# Patient Record
Sex: Male | Born: 1950
Health system: Southern US, Community
[De-identification: ages and names within clinical notes are randomized; demographics above are authoritative.]

## PROBLEM LIST (undated history)

## (undated) DIAGNOSIS — K219 Gastro-esophageal reflux disease without esophagitis: Secondary | ICD-10-CM

## (undated) DIAGNOSIS — E782 Mixed hyperlipidemia: Secondary | ICD-10-CM

## (undated) DIAGNOSIS — M1990 Unspecified osteoarthritis, unspecified site: Secondary | ICD-10-CM

## (undated) DIAGNOSIS — M179 Osteoarthritis of knee, unspecified: Secondary | ICD-10-CM

## (undated) DIAGNOSIS — G20A1 Parkinson's disease without dyskinesia, without mention of fluctuations: Secondary | ICD-10-CM

## (undated) DIAGNOSIS — M1710 Unilateral primary osteoarthritis, unspecified knee: Secondary | ICD-10-CM

## (undated) DIAGNOSIS — G709 Myoneural disorder, unspecified: Secondary | ICD-10-CM

## (undated) DIAGNOSIS — T7840XA Allergy, unspecified, initial encounter: Secondary | ICD-10-CM

## (undated) DIAGNOSIS — Z86010 Personal history of colonic polyps: Secondary | ICD-10-CM

## (undated) DIAGNOSIS — E669 Obesity, unspecified: Secondary | ICD-10-CM

## (undated) DIAGNOSIS — G20 Parkinson's disease: Secondary | ICD-10-CM

## (undated) DIAGNOSIS — I10 Essential (primary) hypertension: Secondary | ICD-10-CM

## (undated) DIAGNOSIS — H269 Unspecified cataract: Secondary | ICD-10-CM

## (undated) DIAGNOSIS — E119 Type 2 diabetes mellitus without complications: Secondary | ICD-10-CM

## (undated) HISTORY — DX: Unspecified osteoarthritis, unspecified site: M19.90

## (undated) HISTORY — DX: Unspecified cataract: H26.9

## (undated) HISTORY — PX: GANGLION CYST EXCISION: SHX1691

## (undated) HISTORY — DX: Parkinson's disease: G20

## (undated) HISTORY — DX: Essential (primary) hypertension: I10

## (undated) HISTORY — DX: Type 2 diabetes mellitus without complications: E11.9

## (undated) HISTORY — DX: Allergy, unspecified, initial encounter: T78.40XA

## (undated) HISTORY — DX: Osteoarthritis of knee, unspecified: M17.9

## (undated) HISTORY — DX: Mixed hyperlipidemia: E78.2

## (undated) HISTORY — DX: Gastro-esophageal reflux disease without esophagitis: K21.9

## (undated) HISTORY — DX: Myoneural disorder, unspecified: G70.9

## (undated) HISTORY — DX: Obesity, unspecified: E66.9

## (undated) HISTORY — PX: CATARACT EXTRACTION, BILATERAL: SHX1313

## (undated) HISTORY — PX: KNEE ARTHROSCOPY: SHX127

## (undated) HISTORY — DX: Parkinson's disease without dyskinesia, without mention of fluctuations: G20.A1

## (undated) HISTORY — PX: WRIST FRACTURE SURGERY: SHX121

## (undated) HISTORY — DX: Unilateral primary osteoarthritis, unspecified knee: M17.10

## (undated) HISTORY — PX: KNEE ARTHROSCOPY: SUR90

---
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
## 1998-07-11 ENCOUNTER — Other Ambulatory Visit: Admission: RE | Admit: 1998-07-11 | Discharge: 1998-07-11 | Payer: Self-pay | Admitting: Dermatology

## 2000-05-09 ENCOUNTER — Emergency Department (HOSPITAL_COMMUNITY): Admission: EM | Admit: 2000-05-09 | Discharge: 2000-05-09 | Payer: Self-pay

## 2009-03-30 ENCOUNTER — Encounter (INDEPENDENT_AMBULATORY_CARE_PROVIDER_SITE_OTHER): Payer: Self-pay | Admitting: *Deleted

## 2009-06-21 ENCOUNTER — Encounter: Payer: Self-pay | Admitting: Internal Medicine

## 2009-08-16 ENCOUNTER — Ambulatory Visit: Payer: Self-pay | Admitting: Internal Medicine

## 2009-08-29 ENCOUNTER — Ambulatory Visit: Payer: Self-pay | Admitting: Internal Medicine

## 2010-11-23 ENCOUNTER — Emergency Department (HOSPITAL_COMMUNITY): Admission: EM | Admit: 2010-11-23 | Discharge: 2010-08-14 | Payer: Self-pay | Admitting: Emergency Medicine

## 2011-03-02 LAB — URINALYSIS, ROUTINE W REFLEX MICROSCOPIC
Bilirubin Urine: NEGATIVE
Glucose, UA: NEGATIVE mg/dL
Ketones, ur: NEGATIVE mg/dL
Nitrite: NEGATIVE
Protein, ur: 30 mg/dL — AB
Specific Gravity, Urine: 1.022 (ref 1.005–1.030)
Urobilinogen, UA: 1 mg/dL (ref 0.0–1.0)
pH: 5 (ref 5.0–8.0)

## 2011-03-02 LAB — URINE MICROSCOPIC-ADD ON

## 2011-03-02 LAB — CULTURE, BLOOD (ROUTINE X 2)

## 2011-03-02 LAB — URINE CULTURE
Colony Count: >=100000
Culture  Setup Time: 201108290931

## 2011-03-02 LAB — DIFFERENTIAL
Basophils Relative: 0 % (ref 0–1)
Eosinophils Absolute: 0.1 10*3/uL (ref 0.0–0.7)
Eosinophils Relative: 0 % (ref 0–5)
Lymphs Abs: 1 10*3/uL (ref 0.7–4.0)

## 2011-03-02 LAB — POCT I-STAT, CHEM 8
Creatinine, Ser: 1.3 mg/dL (ref 0.4–1.5)
Glucose, Bld: 136 mg/dL — ABNORMAL HIGH (ref 70–99)
HCT: 47 % (ref 39.0–52.0)
Hemoglobin: 16 g/dL (ref 13.0–17.0)
Sodium: 138 mEq/L (ref 135–145)
TCO2: 23 mmol/L (ref 0–100)

## 2011-03-02 LAB — GLUCOSE, CAPILLARY
Glucose-Capillary: 129 mg/dL — ABNORMAL HIGH (ref 70–99)
Glucose-Capillary: 176 mg/dL — ABNORMAL HIGH (ref 70–99)

## 2011-03-02 LAB — CBC
MCH: 32.2 pg (ref 26.0–34.0)
MCHC: 34.8 g/dL (ref 30.0–36.0)
MCV: 92.5 fL (ref 78.0–100.0)
Platelets: 168 10*3/uL (ref 150–400)
RBC: 4.78 MIL/uL (ref 4.22–5.81)

## 2011-10-09 ENCOUNTER — Encounter: Payer: Self-pay | Admitting: Physician Assistant

## 2011-10-09 DIAGNOSIS — M1710 Unilateral primary osteoarthritis, unspecified knee: Secondary | ICD-10-CM | POA: Insufficient documentation

## 2011-10-09 DIAGNOSIS — E782 Mixed hyperlipidemia: Secondary | ICD-10-CM

## 2011-10-09 DIAGNOSIS — E119 Type 2 diabetes mellitus without complications: Secondary | ICD-10-CM | POA: Insufficient documentation

## 2011-10-09 DIAGNOSIS — I10 Essential (primary) hypertension: Secondary | ICD-10-CM | POA: Insufficient documentation

## 2011-12-27 ENCOUNTER — Ambulatory Visit (INDEPENDENT_AMBULATORY_CARE_PROVIDER_SITE_OTHER): Payer: 59

## 2011-12-27 DIAGNOSIS — H9319 Tinnitus, unspecified ear: Secondary | ICD-10-CM

## 2011-12-27 DIAGNOSIS — R51 Headache: Secondary | ICD-10-CM

## 2012-01-29 ENCOUNTER — Ambulatory Visit (INDEPENDENT_AMBULATORY_CARE_PROVIDER_SITE_OTHER): Payer: 59 | Admitting: Physician Assistant

## 2012-01-29 ENCOUNTER — Encounter: Payer: Self-pay | Admitting: Physician Assistant

## 2012-01-29 VITALS — BP 136/91 | HR 82 | Temp 98.5°F | Resp 16 | Ht 70.0 in | Wt 225.2 lb

## 2012-01-29 DIAGNOSIS — Z0000 Encounter for general adult medical examination without abnormal findings: Secondary | ICD-10-CM

## 2012-01-29 DIAGNOSIS — E119 Type 2 diabetes mellitus without complications: Secondary | ICD-10-CM

## 2012-01-29 DIAGNOSIS — E782 Mixed hyperlipidemia: Secondary | ICD-10-CM

## 2012-01-29 DIAGNOSIS — I10 Essential (primary) hypertension: Secondary | ICD-10-CM

## 2012-01-29 DIAGNOSIS — M24231 Disorder of ligament, right wrist: Secondary | ICD-10-CM

## 2012-01-29 DIAGNOSIS — Z79899 Other long term (current) drug therapy: Secondary | ICD-10-CM

## 2012-01-29 LAB — COMPREHENSIVE METABOLIC PANEL
Albumin: 4.4 g/dL (ref 3.5–5.2)
Alkaline Phosphatase: 49 U/L (ref 39–117)
BUN: 18 mg/dL (ref 6–23)
Calcium: 9.5 mg/dL (ref 8.4–10.5)
Glucose, Bld: 108 mg/dL — ABNORMAL HIGH (ref 70–99)
Potassium: 4.6 mEq/L (ref 3.5–5.3)

## 2012-01-29 LAB — LIPID PANEL
Cholesterol: 126 mg/dL (ref 0–200)
HDL: 41 mg/dL (ref >39)
LDL Cholesterol: 66 mg/dL (ref 0–99)
Triglycerides: 94 mg/dL (ref <150)

## 2012-01-29 LAB — POCT UA - MICROSCOPIC ONLY: Bacteria, U Microscopic: NEGATIVE

## 2012-01-29 LAB — TSH: TSH: 3.372 u[IU]/mL (ref 0.350–4.500)

## 2012-01-29 LAB — CBC WITH DIFFERENTIAL/PLATELET
Basophils Relative: 0 % (ref 0–1)
HCT: 44.3 % (ref 39.0–52.0)
Hemoglobin: 15.1 g/dL (ref 13.0–17.0)
MCH: 31.1 pg (ref 26.0–34.0)
MCHC: 34.1 g/dL (ref 30.0–36.0)
Monocytes Absolute: 0.6 10*3/uL (ref 0.1–1.0)
Monocytes Relative: 8 % (ref 3–12)
Neutro Abs: 4.8 10*3/uL (ref 1.7–7.7)

## 2012-01-29 LAB — POCT URINALYSIS DIPSTICK
Blood, UA: NEGATIVE
Leukocytes, UA: NEGATIVE
Spec Grav, UA: >=1.03
pH, UA: 5.5

## 2012-01-29 LAB — PSA: PSA: 0.67 ng/mL (ref <=4.00)

## 2012-01-29 LAB — IFOBT (OCCULT BLOOD): IFOBT: NEGATIVE

## 2012-01-29 NOTE — Assessment & Plan Note (Signed)
A1C today is 6.2%.  Continue current regimen and continue efforts for healthy eating and weight loss.

## 2012-01-29 NOTE — Assessment & Plan Note (Signed)
Above goal today, but typically well controlled, so no changes made today.  Again, healthy eating and weight loss are needed.

## 2012-01-29 NOTE — Assessment & Plan Note (Signed)
LDL goal <70. Await today's lab results.  Continue efforts for healthy eating.

## 2012-01-29 NOTE — Patient Instructions (Signed)

## 2012-01-29 NOTE — Progress Notes (Signed)
Subjective:    Patient ID: Danny [name], male    DOB: 1951-06-23, 61 y.o.   MRN: [id number]  HPI This patient presents for a Wellness exam.  Also, he needs routine follow-up for his diabetes, HTN and hyperlipidemia.  He feels well.  Continues square dancing and motorcycle riding.  Has grown a goatee. Blood sugars have run a little high on occasion, but only three above 200 in the past three months.   Review of Systems  Constitutional: Negative.   HENT: Negative.   Eyes: Negative.   Respiratory: Negative.   Cardiovascular: Negative.   Gastrointestinal: Negative.   Genitourinary: Negative.   Musculoskeletal: Positive for joint swelling and arthralgias.       Right wrist ligamentous derangement.  To have wrist fusion in the next 1-2 months.  Skin: Negative.   Neurological: Negative.   Hematological: Negative.   Psychiatric/Behavioral: Negative.        Objective:   Physical Exam  Vitals reviewed. Constitutional: He is oriented to person, place, and time. He appears well-developed and well-nourished.  Non-toxic appearance. He does not have a sickly appearance. He does not appear ill. No distress.  HENT:  Head: Normocephalic and atraumatic. No trismus in the jaw.  Right Ear: Hearing, tympanic membrane, external ear and ear canal normal.  Left Ear: Hearing, tympanic membrane, external ear and ear canal normal.  Nose: Nose normal.  Mouth/Throat: Uvula is midline, oropharynx is clear and moist and mucous membranes are normal. He does not have dentures. No oral lesions. Normal dentition. No dental abscesses, uvula swelling, lacerations or dental caries.  Eyes: Conjunctivae and EOM are normal. Pupils are equal, round, and reactive to light. Right eye exhibits no discharge. Left eye exhibits no discharge. No scleral icterus.  Fundoscopic exam:      The right eye shows no arteriolar narrowing, no AV nicking, no exudate, no hemorrhage and no papilledema. The right eye shows red reflex.The  right eye shows no venous pulsations.      The left eye shows no arteriolar narrowing, no AV nicking, no exudate, no hemorrhage and no papilledema. The left eye shows red reflex.The left eye shows no venous pulsations. Neck: Normal range of motion, full passive range of motion without pain and phonation normal. Neck supple. No spinous process tenderness and no muscular tenderness present. No rigidity. No tracheal deviation, no edema, no erythema and normal range of motion present. No thyromegaly present.  Cardiovascular: Normal rate, regular rhythm, S1 normal, S2 normal, normal heart sounds, intact distal pulses and normal pulses.  Exam reveals no gallop and no friction rub.   No murmur heard. Pulmonary/Chest: Effort normal and breath sounds normal. No respiratory distress. He has no wheezes. He has no rales.  Abdominal: Soft. Normal appearance and bowel sounds are normal. He exhibits no distension and no mass. There is no hepatosplenomegaly. There is no tenderness. There is no rebound and no guarding. No hernia. Hernia confirmed negative in the right inguinal area and confirmed negative in the left inguinal area.  Genitourinary: Rectum normal, prostate normal, testes normal and penis normal. Rectal exam shows no external hemorrhoid, no internal hemorrhoid, no fissure, no mass, no tenderness and anal tone normal. Guaiac negative stool. No phimosis, paraphimosis, hypospadias, penile erythema or penile tenderness. No discharge found.  Musculoskeletal: He exhibits no edema and no tenderness.       Right shoulder: Normal.       Left shoulder: Normal.       Right elbow: Normal.  Left elbow: Normal.       Right wrist: He exhibits decreased range of motion, tenderness and swelling.       Left wrist: Normal.       Right hip: Normal.       Left hip: Normal.       Right knee: Normal.       Left knee: Normal.       Right ankle: Normal. Achilles tendon normal.       Left ankle: Normal. Achilles tendon  normal.       Cervical back: Normal. He exhibits normal range of motion, no tenderness, no bony tenderness, no swelling, no edema, no deformity, no laceration, no pain, no spasm and normal pulse.       Thoracic back: Normal.       Lumbar back: Normal.       Right upper arm: Normal.       Left upper arm: Normal.       Right forearm: Normal.       Left forearm: Normal.       Arms:      Right hand: Normal.       Left hand: Normal.       Right upper leg: Normal.       Left upper leg: Normal.       Right lower leg: Normal.       Left lower leg: Normal.       Right foot: Normal.       Left foot: Normal.  Lymphadenopathy:       Head (right side): No submental, no submandibular, no tonsillar, no preauricular, no posterior auricular and no occipital adenopathy present.       Head (left side): No submental, no submandibular, no tonsillar, no preauricular, no posterior auricular and no occipital adenopathy present.    He has no cervical adenopathy.       Right: No inguinal and no supraclavicular adenopathy present.       Left: No inguinal and no supraclavicular adenopathy present.  Neurological: He is alert and oriented to person, place, and time. He has normal strength and normal reflexes. He displays no tremor. No cranial nerve deficit or sensory deficit. He exhibits normal muscle tone. Coordination and gait normal.       Sensation intact to monofilament testing bilaterally on diabetic foot exam.  Skin: Skin is warm, dry and intact. No abrasion, no ecchymosis, no laceration, no lesion and no rash noted. He is not diaphoretic. No cyanosis or erythema. No pallor. Nails show no clubbing.  Psychiatric: He has a normal mood and affect. His speech is normal and behavior is normal. Judgment and thought content normal. Cognition and memory are normal.   Results for orders placed in visit on 01/29/12  GLUCOSE, POCT (MANUAL RESULT ENTRY)      Component Value Range   POC Glucose 116    POCT GLYCOSYLATED  HEMOGLOBIN (HGB A1C)      Component Value Range   Hemoglobin A1C 6.2    POCT UA - MICROSCOPIC ONLY      Component Value Range   WBC, Ur, HPF, POC 0-1     RBC, urine, microscopic 0-3     Bacteria, U Microscopic neg     Mucus, UA moderate     Epithelial cells, urine per micros 0-1     Crystals, Ur, HPF, POC o-1 calcium oxalte     Casts, Ur, LPF, POC 0-1 granular     Yeast,  UA neg    POCT URINALYSIS DIPSTICK      Component Value Range   Color, UA yellow     Clarity, UA clear     Glucose, UA neg     Bilirubin, UA small     Ketones, UA trace     Spec Grav, UA >=1.030     Blood, UA neg     pH, UA 5.5     Protein, UA 30     Urobilinogen, UA 1.0     Nitrite, UA neg     Leukocytes, UA Negative    IFOBT (OCCULT BLOOD)      Component Value Range   IFOBT Negative      EKG is normal, and reviewed with Dr. Cleta Alberts. Assessment & Plan:

## 2012-01-30 ENCOUNTER — Encounter: Payer: Self-pay | Admitting: Physician Assistant

## 2012-02-13 ENCOUNTER — Encounter: Payer: Self-pay | Admitting: Physician Assistant

## 2012-03-31 ENCOUNTER — Telehealth: Payer: Self-pay | Admitting: Physician Assistant

## 2012-03-31 NOTE — Telephone Encounter (Signed)
Pt CB and clarified that he is able to cut the simvastatin in half so he only takes 20 mg QD and does not have problem w/myalgias. pts [facility] form is back in [place] phone message stack

## 2012-03-31 NOTE — Telephone Encounter (Signed)
Patient requests refill of Simvastatin through [facility].  Attempted to contact them directly to request, but they told him they have no record of him ever taking simvastatin.  My notes indicate that it was D/C'd due to myalgias.  [facility] [employer] called the patient for clarification, but had to leave a message.  I am happy to re-authorize the simvastatin if he did NOT have myalgias as a result of taking the medication.

## 2012-04-01 NOTE — Telephone Encounter (Signed)
I have filled out the form at TL desk.  If we can send it through epic, let's do that.

## 2012-04-02 MED ORDER — SIMVASTATIN 40 MG PO TABS: ORAL_TABLET | ORAL | Status: DC

## 2012-04-02 NOTE — Telephone Encounter (Signed)
[facility]

## 2012-04-16 HISTORY — PX: WRIST SURGERY: SHX841

## 2012-05-06 ENCOUNTER — Ambulatory Visit: Payer: 59 | Admitting: Physician Assistant

## 2012-05-11 ENCOUNTER — Other Ambulatory Visit: Payer: Self-pay | Admitting: Physician Assistant

## 2012-05-25 ENCOUNTER — Ambulatory Visit (INDEPENDENT_AMBULATORY_CARE_PROVIDER_SITE_OTHER): Payer: 59 | Admitting: Internal Medicine

## 2012-05-25 ENCOUNTER — Other Ambulatory Visit: Payer: Self-pay | Admitting: Physician Assistant

## 2012-05-25 ENCOUNTER — Ambulatory Visit: Payer: 59

## 2012-05-25 VITALS — BP 111/73 | HR 85 | Temp 97.9°F | Resp 18 | Ht 70.5 in | Wt 227.0 lb

## 2012-05-25 DIAGNOSIS — R0781 Pleurodynia: Secondary | ICD-10-CM

## 2012-05-25 DIAGNOSIS — R079 Chest pain, unspecified: Secondary | ICD-10-CM

## 2012-05-25 NOTE — Progress Notes (Signed)
  Subjective:    Patient ID: Danny [name], male    DOB: September 15, 1951, 61 y.o.   MRN: [id number]  HPI He fell, tripping over a pot in the dark in his backyard, Landing on his chest on gravel He now has an area of tenderness in the right chest wall that hurts with deep breathing, lifting, twisting, reaching.   Review of Systems     Objective:   Physical Exam  Tender to palpation along the right anterior lateral ribs from nipple line to costal margin No swelling or ecchymoses Lungs clear     [facility] reading (PRIMARY) by  Dr. Merla Riches no fracture   Assessment & Plan:  Problem #1 chest wall pain secondary to contusion Reassured May use heat and over-the-counter medications

## 2012-05-29 ENCOUNTER — Ambulatory Visit (INDEPENDENT_AMBULATORY_CARE_PROVIDER_SITE_OTHER): Payer: 59 | Admitting: Physician Assistant

## 2012-05-29 ENCOUNTER — Encounter: Payer: Self-pay | Admitting: Physician Assistant

## 2012-05-29 VITALS — BP 114/77 | HR 85 | Temp 98.1°F | Resp 16 | Ht 70.0 in | Wt 222.6 lb

## 2012-05-29 DIAGNOSIS — G5710 Meralgia paresthetica, unspecified lower limb: Secondary | ICD-10-CM

## 2012-05-29 DIAGNOSIS — E119 Type 2 diabetes mellitus without complications: Secondary | ICD-10-CM

## 2012-05-29 DIAGNOSIS — I10 Essential (primary) hypertension: Secondary | ICD-10-CM

## 2012-05-29 DIAGNOSIS — E782 Mixed hyperlipidemia: Secondary | ICD-10-CM

## 2012-05-29 DIAGNOSIS — G5712 Meralgia paresthetica, left lower limb: Secondary | ICD-10-CM

## 2012-05-29 LAB — COMPREHENSIVE METABOLIC PANEL
Alkaline Phosphatase: 56 U/L (ref 39–117)
BUN: 22 mg/dL (ref 6–23)
CO2: 25 mEq/L (ref 19–32)
Creat: 1.22 mg/dL (ref 0.50–1.35)
Glucose, Bld: 83 mg/dL (ref 70–99)
Total Bilirubin: 0.9 mg/dL (ref 0.3–1.2)
Total Protein: 7.4 g/dL (ref 6.0–8.3)

## 2012-05-29 LAB — LIPID PANEL
Cholesterol: 133 mg/dL (ref 0–200)
LDL Cholesterol: 61 mg/dL (ref 0–99)
Triglycerides: 125 mg/dL (ref <150)

## 2012-05-29 NOTE — Progress Notes (Signed)
  Subjective:    Patient ID: Danny [name], male    DOB: 30-Nov-1951, 61 y.o.   MRN: [id number]  HPI Presents for routine follow-up of DM type 2, HTN, elevated lipids.   Since his last visit he's had surgery on the right wrist, with significant improvement in the pain he was having there, at the site of a previous ganglion cyst excision.  He hasn't been released back to motorcycle riding yet by PT. Also, last week he fell in his yard and landed on the right chest (avoided landing on the outstretched arm and re-injuring the wrist!).  He sustained contusions to the right ribs, which are slowly improving.  Sees DDS next week.  Scheduling eye appointment now.  His home glucose diary is reviewed. One reading of 66, 3 readings above 200.  Review of Systems Rib soreness with cough, laugh, sneeze. No SOB, HA, dizziness, vision change, N/V, diarrhea, constipation, dysuria, urinary urgency or frequency, myalgias, arthralgias or rash. He has intermittent tingling and burning sensation in the top of the left thigh.  It occurs after long periods of standing and resolves when he sits down.     Objective:   Physical Exam  Vital signs noted. Well-developed, well nourished WM who is awake, alert and oriented, in NAD. HEENT: [place]/AT, PERRL, EOMI.  Sclera and conjunctiva are clear.  EAC are patent, TMs are normal in appearance. Nasal mucosa is pink and moist. OP is clear. Neck: supple, non-tender, no lymphadenopathy, thyromegaly. Heart: RRR, no murmur Lungs: CTA Extremities: no cyanosis, clubbing or edema. Skin: warm and dry without rash. Left great toenail has fallen off (accidentally "kicked" a cement curb wearing steel-toed shoes in March).  Surgical wound on the dorsum of the right wrist is healing well.  Results for orders placed in visit on 05/29/12  GLUCOSE, POCT (MANUAL RESULT ENTRY)      Component Value Range   POC Glucose 92  70 - 99 mg/dl  POCT GLYCOSYLATED HEMOGLOBIN (HGB A1C)   Component Value Range   Hemoglobin A1C 6.7        Assessment & Plan:   1. Type II or unspecified type diabetes mellitus without mention of complication, not stated as uncontrolled  POCT glucose (manual entry), POCT glycosylated hemoglobin (Hb A1C), Microalbumin, urine  2. Mixed hyperlipidemia  Lipid panel  3. Essential hypertension, benign  Comprehensive metabolic panel  4. Meralgia paresthetica of left side  Information provided   Continue current treatment. Re-check in 3 months.

## 2012-05-29 NOTE — Patient Instructions (Addendum)
Meralgia Paresthetica  Meralgia paresthetica (MP) is a disorder characterized by tingling, numbness, and burning pain in the outer side of the thigh. It occurs in men more than women. MP is generally found in middle-aged or overweight people. Sometimes, the disorder may disappear. CAUSES The disorder is caused by a nerve in the thigh being squeezed (compressed). MP may be associated with tight clothing, pregnancy, diabetes, and being overweight (obese). SYMPTOMS  Tingling, numbness, and burning in the outer thigh.   An area of the skin may be painful and sensitive to the touch.  The symptoms often worsen after walking or standing. TREATMENT  Treatment is based on your symptoms and is mainly supportive. Treatment may include:  Wearing looser clothing.   Losing weight.   Avoiding prolonged standing or walking.   Taking medication.   Surgery if the pain is peristent or severe.  MP usually eases or disappears after treatment. Surgery is not always fully successful. Document Released: 11/23/2002 Document Revised: 11/22/2011 Document Reviewed: 12/03/2005 [employer] Patient Information 2012 [employer]. 

## 2012-05-30 ENCOUNTER — Encounter: Payer: Self-pay | Admitting: Physician Assistant

## 2012-05-30 LAB — MICROALBUMIN, URINE: Microalb, Ur: 1.21 mg/dL (ref 0.00–1.89)

## 2012-07-03 ENCOUNTER — Telehealth: Payer: Self-pay | Admitting: Physician Assistant

## 2012-07-03 ENCOUNTER — Other Ambulatory Visit: Payer: Self-pay | Admitting: Physician Assistant

## 2012-07-03 NOTE — Telephone Encounter (Signed)
[facility] toolkit received regarding possible change to lower-cost alternative to Janumet 50/1000 1 PO BID via [employer] "Alternatives:" Glyburide/metformin 5/500 2 PO BID Pioglitazone/metformin 15/500  1 PO TID  Unfortunately, NEITHER of these are acceptable alternatives to Janumet, as neither contains Januvia (sitagliptin) or another in it's class.  The patient already take a sulfonylurea product in addition to the Janumet.  As such, I do not intend to change his meds, but I'm happy to discuss this with him at his next visit.

## 2012-07-05 NOTE — Telephone Encounter (Signed)
[facility] notifying patient about request, and that it will be discussed at next OV.

## 2012-09-09 ENCOUNTER — Encounter: Payer: Self-pay | Admitting: Physician Assistant

## 2012-09-09 ENCOUNTER — Ambulatory Visit (INDEPENDENT_AMBULATORY_CARE_PROVIDER_SITE_OTHER): Payer: 59 | Admitting: Physician Assistant

## 2012-09-09 VITALS — BP 124/80 | HR 80 | Temp 98.4°F | Resp 16 | Ht 70.0 in | Wt 221.6 lb

## 2012-09-09 DIAGNOSIS — I10 Essential (primary) hypertension: Secondary | ICD-10-CM

## 2012-09-09 DIAGNOSIS — M24231 Disorder of ligament, right wrist: Secondary | ICD-10-CM

## 2012-09-09 DIAGNOSIS — E782 Mixed hyperlipidemia: Secondary | ICD-10-CM

## 2012-09-09 DIAGNOSIS — E119 Type 2 diabetes mellitus without complications: Secondary | ICD-10-CM

## 2012-09-09 LAB — COMPREHENSIVE METABOLIC PANEL
Albumin: 4.2 g/dL (ref 3.5–5.2)
Alkaline Phosphatase: 49 U/L (ref 39–117)
CO2: 22 mEq/L (ref 19–32)
Chloride: 106 mEq/L (ref 96–112)
Glucose, Bld: 91 mg/dL (ref 70–99)
Potassium: 4.5 mEq/L (ref 3.5–5.3)
Sodium: 139 mEq/L (ref 135–145)
Total Protein: 6.6 g/dL (ref 6.0–8.3)

## 2012-09-09 LAB — LIPID PANEL
LDL Cholesterol: 52 mg/dL (ref 0–99)
Triglycerides: 94 mg/dL (ref <150)

## 2012-09-09 NOTE — Progress Notes (Signed)
Subjective:    Patient ID: Danny [name], male    DOB: Feb 12, 1951, 62 y.o.   MRN: [id number]  HPI This 61 y.o. male presents for evaluation of DM, lipids, HTN.  Feels good.  New lenses yesterday. Cataract surgery likely next year.  Review of Systems Denies chest pain, shortness of breath, HA, dizziness, vision change, nausea, vomiting, diarrhea, constipation, melena, hematochezia, dysuria, increased urinary urgency or frequency, increased hunger or thirst, unintentional weight change, unexplained myalgias or arthralgias, rash.  Checks BS TID.  Last month had some elevations (never >150) when his father died last month. Checks feet daily. Sees eye specialist annually. Sees DDS Q 6 months. Will get flu vaccine at work. Is current on pneumonia vaccine.  Has not yet had the shingles vaccine.  Past Medical History  Diagnosis Date  . Type II or unspecified type diabetes mellitus without mention of complication, not stated as uncontrolled   . Mixed hyperlipidemia   . OA (osteoarthritis) of knee     right  . Essential hypertension, benign   . Obesity, unspecified     Past Surgical History  Procedure Date  . Knee arthroscopy     right  . Ganglion cyst excision age 63 years    right  . Wrist surgery 04/2012    Prior to Admission medications   Medication Sig Start Date End Date Taking? Authorizing Provider  aspirin 81 MG tablet Take 81 mg by mouth daily.     Yes Historical Provider, MD  enalapril (VASOTEC) 2.5 MG tablet TAKE 1 TABLET DAILY 05/11/12  Yes Calissa Swenor S Vraj Denardo, PA-C  Ginger, Zingiber officinalis, 550 MG CAPS Take 550 mg by mouth daily.     Yes Historical Provider, MD  Glucosamine-Chondroit-Vit C-Mn (GLUCOSAMINE CHONDR 1500 COMPLX PO) Take 1,500 mg by mouth daily.     Yes Historical Provider, MD  HYDROcodone-acetaminophen (LORTAB) 7.5-500 MG per tablet  05/05/12  Yes Historical Provider, MD  JANUMET 50-1000 MG per tablet TAKE 1 TABLET TWICE A DAY WITH MEALS 07/03/12  Yes  Heather M Marte, PA-C  simvastatin (ZOCOR) 40 MG tablet Take 1/2 tablet daily 04/02/12  Yes Pattricia Boss, PA-C  vitamin C (ASCORBIC ACID) 500 MG tablet Take 500 mg by mouth daily.     Yes Historical Provider, MD  GLIPIZIDE XL 2.5 MG 24 hr tablet TAKE 1 TABLET DAILY 05/11/12   Masiel Gentzler S Quency Tober, PA-C  meloxicam (MOBIC) 15 MG tablet Take 15 mg by mouth daily.    Historical Provider, MD    Allergies  Allergen Reactions  . Codeine Nausea And Vomiting  . Simvastatin Other (See Comments)    myalgias    History   Social History  . Marital Status: Married    Spouse Name: Darel Hong    Number of Children: 3  . Years of Education: N/A   Occupational History  . Engineer    Social History Main Topics  . Smoking status: Former Smoker -- 1.5 packs/day for 15 years    Types: Cigarettes   Comment: over 40 yrs   Family History  Problem Relation Age of Onset  . Diabetes Mother   . Diabetes Sister   . Diabetes Daughter        Objective:   Physical Exam Blood pressure 124/80, pulse 80, temperature 98.4 F (36.9 C), temperature source Oral, resp. rate 16, height 5\' 10"  (1.778 m), weight 221 lb 9.6 oz (100.517 kg), SpO2 97.00%. Body mass index is 31.80 kg/(m^2). Well-developed, well nourished WM who is awake,  alert and oriented, in NAD. HEENT: [place]/AT, sclera and conjunctiva are clear.   Neck: supple, non-tender, no lymphadenopathy, thyromegaly. Heart: RRR, no murmur Lungs: normal effort, CTA Abdomen: normo-active bowel sounds, supple, non-tender, no mass or organomegaly. Extremities: no cyanosis, clubbing or edema. Skin: warm and dry without rash. See DM foot exam.  Results for orders placed in visit on 09/09/12  GLUCOSE, POCT (MANUAL RESULT ENTRY)      Component Value Range   POC Glucose 105 (*) 70 - 99 mg/dl  POCT GLYCOSYLATED HEMOGLOBIN (HGB A1C)      Component Value Range   Hemoglobin A1C 6.1        Assessment & Plan:   1. Type II or unspecified type diabetes mellitus without  mention of complication, not stated as uncontrolled  POCT glucose (manual entry), POCT glycosylated hemoglobin (Hb A1C), Comprehensive metabolic panel  2. Mixed hyperlipidemia  Lipid panel  3. Essential hypertension, benign  Comprehensive metabolic panel

## 2012-09-09 NOTE — Patient Instructions (Signed)
Check with [facility] to see if Combiglyze or Onglyza is preferred over Janumet and Januvia.  If so, I'm happy to switch you to save money.

## 2012-09-09 NOTE — Assessment & Plan Note (Signed)
Continue current treatment pending lab results. 

## 2012-09-09 NOTE — Assessment & Plan Note (Signed)
Controlled. Continue current treatment. 

## 2012-09-09 NOTE — Assessment & Plan Note (Signed)
Controlled.  Continue current treatment.  RTC 3 months. 

## 2012-09-20 ENCOUNTER — Other Ambulatory Visit: Payer: Self-pay

## 2012-09-20 MED ORDER — ENALAPRIL MALEATE 2.5 MG PO TABS: 2.5000 mg | ORAL_TABLET | Freq: Every day | ORAL | Status: DC

## 2012-09-20 MED ORDER — SIMVASTATIN 40 MG PO TABS: ORAL_TABLET | ORAL | Status: DC

## 2012-09-20 MED ORDER — SITAGLIPTIN PHOS-METFORMIN HCL 50-1000 MG PO TABS: 1.0000 | ORAL_TABLET | Freq: Two times a day (BID) | ORAL | Status: DC

## 2012-09-20 MED ORDER — GLIPIZIDE ER 2.5 MG PO TB24: 2.5000 mg | ORAL_TABLET | Freq: Every day | ORAL | Status: DC

## 2012-09-26 ENCOUNTER — Other Ambulatory Visit: Payer: Self-pay | Admitting: Radiology

## 2012-09-26 MED ORDER — SIMVASTATIN 40 MG PO TABS: ORAL_TABLET | ORAL | Status: DC

## 2012-12-23 ENCOUNTER — Other Ambulatory Visit: Payer: Self-pay | Admitting: Physician Assistant

## 2012-12-23 MED ORDER — GLIPIZIDE ER 2.5 MG PO TB24: 2.5000 mg | ORAL_TABLET | Freq: Every day | ORAL | Status: DC

## 2012-12-23 MED ORDER — ENALAPRIL MALEATE 2.5 MG PO TABS: 2.5000 mg | ORAL_TABLET | Freq: Every day | ORAL | Status: DC

## 2012-12-23 MED ORDER — SITAGLIPTIN PHOS-METFORMIN HCL 50-1000 MG PO TABS: 1.0000 | ORAL_TABLET | Freq: Two times a day (BID) | ORAL | Status: DC

## 2012-12-25 ENCOUNTER — Other Ambulatory Visit: Payer: Self-pay | Admitting: Physician Assistant

## 2012-12-30 ENCOUNTER — Encounter: Payer: Self-pay | Admitting: Physician Assistant

## 2012-12-30 ENCOUNTER — Ambulatory Visit (INDEPENDENT_AMBULATORY_CARE_PROVIDER_SITE_OTHER): Payer: 59 | Admitting: Physician Assistant

## 2012-12-30 VITALS — BP 109/78 | HR 88 | Temp 97.8°F | Resp 16 | Ht 70.0 in | Wt 220.0 lb

## 2012-12-30 DIAGNOSIS — E119 Type 2 diabetes mellitus without complications: Secondary | ICD-10-CM

## 2012-12-30 DIAGNOSIS — R5381 Other malaise: Secondary | ICD-10-CM

## 2012-12-30 DIAGNOSIS — E782 Mixed hyperlipidemia: Secondary | ICD-10-CM

## 2012-12-30 DIAGNOSIS — I10 Essential (primary) hypertension: Secondary | ICD-10-CM

## 2012-12-30 DIAGNOSIS — R5383 Other fatigue: Secondary | ICD-10-CM

## 2012-12-30 LAB — CBC WITH DIFFERENTIAL/PLATELET
Basophils Absolute: 0 10*3/uL (ref 0.0–0.1)
Eosinophils Relative: 2 % (ref 0–5)
Lymphocytes Relative: 22 % (ref 12–46)
MCV: 90.5 fL (ref 78.0–100.0)
Neutro Abs: 5.3 10*3/uL (ref 1.7–7.7)
Neutrophils Relative %: 68 % (ref 43–77)
Platelets: 220 10*3/uL (ref 150–400)
RDW: 14.5 % (ref 11.5–15.5)
WBC: 7.8 10*3/uL (ref 4.0–10.5)

## 2012-12-30 LAB — GLUCOSE, POCT (MANUAL RESULT ENTRY): POC Glucose: 137 mg/dl — AB (ref 70–99)

## 2012-12-30 LAB — COMPREHENSIVE METABOLIC PANEL
ALT: 33 U/L (ref 0–53)
AST: 22 U/L (ref 0–37)
Calcium: 10.7 mg/dL — ABNORMAL HIGH (ref 8.4–10.5)
Chloride: 103 mEq/L (ref 96–112)
Creat: 1.02 mg/dL (ref 0.50–1.35)
Total Bilirubin: 1.1 mg/dL (ref 0.3–1.2)

## 2012-12-30 LAB — LIPID PANEL
Total CHOL/HDL Ratio: 2.7 Ratio
VLDL: 22 mg/dL (ref 0–40)

## 2012-12-30 LAB — TSH: TSH: 4.216 u[IU]/mL (ref 0.350–4.500)

## 2012-12-30 NOTE — Progress Notes (Signed)
Subjective:    Patient ID: Danny [name], male    DOB: 1951/01/14, 62 y.o.   MRN: [id number]  HPI This 62 y.o. male presents for evaluation of DM type 2, hyperlipidemia, HTN.  He reports that he feels well and has no concerns.  He has not been checking his blood sugars as regularly as before, in part because of increased travel for work, and partly because he's just not motivated to do it anymore.  Recall, he used to check 2-4 times daily, and he records the readings in a software program that color codes readings above, within and below his goal ranges, and graphs the readings as well.    His wife is here with him today and is concerned that he's not himself.  He seems depressed to her, unhappy about something, more contemplative than usual. 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  They'll travel to [facility] next month for an event.  Sleeps well "Like a rock." He reports feeling mostly well rested upon waking. Wife reports that he tosses and turns, is very restless, snores.  Has occasionally had pauses in breathing, but it's been a while since the last time she can recall.  Falls asleep in the afternoons when he sits down to watch TV, etc., in "just a minute." His wrist seems to bother him a lot, but he doesn't think it's bothering him..  Frequency of home glucose monitoring: QD most days.  Occasional reading above 200, most 90-180. Sees a dentist Q6 months, eye specialist annually. Checks feet daily. Is current with influenza vaccine. Is current with pneumococcal vaccine.  Past Medical History  Diagnosis Date  . Type II or unspecified type diabetes mellitus without mention of complication, not stated as uncontrolled   . Mixed hyperlipidemia   . OA (osteoarthritis) of knee     right  . Essential hypertension, benign   . Obesity, unspecified     Past Surgical History  Procedure Date  . Knee  arthroscopy     right  . Ganglion cyst excision age 61 years    right  . Wrist surgery 04/2012    Prior to Admission medications   Medication Sig Start Date End Date Taking? Authorizing Provider  aspirin 81 MG tablet Take 81 mg by mouth daily.     Yes Historical Provider, MD  enalapril (VASOTEC) 2.5 MG tablet Take 1 tablet (2.5 mg total) by mouth daily. 12/23/12  Yes Heather M Marte, PA-C  Ginger, Zingiber officinalis, 550 MG CAPS Take 550 mg by mouth daily.     Yes Historical Provider, MD  glipiZIDE (GLIPIZIDE XL) 2.5 MG 24 hr tablet Take 1 tablet (2.5 mg total) by mouth daily. 12/23/12  Yes Heather M Marte, PA-C  Glucosamine-Chondroit-Vit C-Mn (GLUCOSAMINE CHONDR 1500 COMPLX PO) Take 1,500 mg by mouth daily.     Yes Historical Provider, MD  simvastatin (ZOCOR) 40 MG tablet Take 1/2 tablet daily 09/26/12  Yes Heather M Marte, PA-C  sitaGLIPtan-metformin (JANUMET) 50-1000 MG per tablet Take 1 tablet by mouth 2 (two) times daily with a meal. 12/23/12  Yes Heather M Marte, PA-C  vitamin C (ASCORBIC ACID) 500 MG tablet Take 500 mg by mouth daily.     Yes Historical Provider, MD    Allergies  Allergen Reactions  . Codeine Nausea And Vomiting  . Simvastatin Other (See Comments)    Myalgias; tolerates a lower dose  History   Social History  . Marital Status: Married    Spouse Name: Darel Hong    Number of Children: 3  . Years of Education: 16   Occupational History  . Engineer    Social History Main Topics  . Smoking status: Former Smoker -- 1.5 packs/day for 15 years    Types: Cigarettes    Quit date: 12/17/1982  . Smokeless tobacco: Never Used     Comment: over 40 yrs  . Alcohol Use: Yes     Comment: very rare  . Drug Use: No  . Sexually Active: Yes -- Male partner(s)   Other Topics Concern  . Not on file   Social History Narrative   Valrie Hart, motorcycle rider.  He lives with his wife.  He has 3 adult children.  His wife had 2 children, one of whom is deceased.     Family History  Problem Relation Age of Onset  . Diabetes Mother   . Diabetes Sister   . Diabetes Daughter   . Cancer Father   . Heart disease Father     Review of Systems Denies chest pain, shortness of breath, HA, dizziness, vision change, nausea, vomiting, diarrhea, constipation, melena, hematochezia, dysuria, increased urinary urgency or frequency, increased hunger or thirst, unintentional weight change, unexplained myalgias or arthralgias, rash.     Objective:   Physical Exam  Vitals reviewed. Constitutional: He is oriented to person, place, and time. Vital signs are normal. He appears well-developed and well-nourished. No distress.  HENT:  Head: Normocephalic and atraumatic.  Right Ear: Hearing normal.  Left Ear: Hearing normal.  Eyes: EOM are normal. Pupils are equal, round, and reactive to light.  Neck: Normal range of motion. Neck supple. No thyromegaly present.  Cardiovascular: Normal rate, regular rhythm and normal heart sounds.   Pulses:      Radial pulses are 2+ on the right side, and 2+ on the left side.       Dorsalis pedis pulses are 2+ on the right side, and 2+ on the left side.       Posterior tibial pulses are 2+ on the right side, and 2+ on the left side.  Pulmonary/Chest: Effort normal and breath sounds normal.  Lymphadenopathy:       Head (right side): No tonsillar, no preauricular, no posterior auricular and no occipital adenopathy present.       Head (left side): No tonsillar, no preauricular, no posterior auricular and no occipital adenopathy present.    He has no cervical adenopathy.       Right: No supraclavicular adenopathy present.       Left: No supraclavicular adenopathy present.  Neurological: He is alert and oriented to person, place, and time. No sensory deficit.  Skin: Skin is warm, dry and intact. No rash noted. No cyanosis or erythema. Nails show no clubbing.  Psychiatric: He has a normal mood and affect.   See DM foot  exam.   Results for orders placed in visit on 12/30/12  GLUCOSE, POCT (MANUAL RESULT ENTRY)      Component Value Range   POC Glucose 137 (*) 70 - 99 mg/dl  POCT GLYCOSYLATED HEMOGLOBIN (HGB A1C)      Component Value Range   Hemoglobin A1C 6.4        Assessment & Plan:   1. Type II or unspecified type diabetes mellitus without mention of complication, not stated as uncontrolled  Comprehensive metabolic panel, POCT glucose (manual entry), POCT glycosylated hemoglobin (Hb A1C)  2. Mixed hyperlipidemia  Lipid panel  3. Essential hypertension, benign  CBC with Differential  4. Fatigue  CBC with Differential, TSH, Ambulatory referral to Sleep Studies

## 2013-02-13 ENCOUNTER — Encounter: Payer: Self-pay | Admitting: Physician Assistant

## 2013-02-13 DIAGNOSIS — G4751 Confusional arousals: Secondary | ICD-10-CM

## 2013-03-11 ENCOUNTER — Encounter: Payer: Self-pay | Admitting: Emergency Medicine

## 2013-03-16 ENCOUNTER — Other Ambulatory Visit: Payer: Self-pay | Admitting: Physician Assistant

## 2013-03-16 MED ORDER — ENALAPRIL MALEATE 2.5 MG PO TABS: 2.5000 mg | ORAL_TABLET | Freq: Every day | ORAL | Status: DC

## 2013-03-16 MED ORDER — SITAGLIPTIN PHOS-METFORMIN HCL 50-1000 MG PO TABS: 1.0000 | ORAL_TABLET | Freq: Two times a day (BID) | ORAL | Status: DC

## 2013-03-19 ENCOUNTER — Other Ambulatory Visit: Payer: Self-pay | Admitting: Physician Assistant

## 2013-04-07 ENCOUNTER — Ambulatory Visit (INDEPENDENT_AMBULATORY_CARE_PROVIDER_SITE_OTHER): Payer: 59 | Admitting: Physician Assistant

## 2013-04-07 ENCOUNTER — Encounter: Payer: Self-pay | Admitting: Physician Assistant

## 2013-04-07 VITALS — BP 126/88 | HR 88 | Temp 98.2°F | Resp 16 | Ht 70.0 in | Wt 223.0 lb

## 2013-04-07 DIAGNOSIS — E782 Mixed hyperlipidemia: Secondary | ICD-10-CM

## 2013-04-07 DIAGNOSIS — Z125 Encounter for screening for malignant neoplasm of prostate: Secondary | ICD-10-CM

## 2013-04-07 DIAGNOSIS — Z1159 Encounter for screening for other viral diseases: Secondary | ICD-10-CM

## 2013-04-07 DIAGNOSIS — IMO0002 Reserved for concepts with insufficient information to code with codable children: Secondary | ICD-10-CM

## 2013-04-07 DIAGNOSIS — E119 Type 2 diabetes mellitus without complications: Secondary | ICD-10-CM

## 2013-04-07 DIAGNOSIS — Z1211 Encounter for screening for malignant neoplasm of colon: Secondary | ICD-10-CM

## 2013-04-07 DIAGNOSIS — I10 Essential (primary) hypertension: Secondary | ICD-10-CM

## 2013-04-07 DIAGNOSIS — Z23 Encounter for immunization: Secondary | ICD-10-CM

## 2013-04-07 DIAGNOSIS — M1710 Unilateral primary osteoarthritis, unspecified knee: Secondary | ICD-10-CM

## 2013-04-07 DIAGNOSIS — Z0000 Encounter for general adult medical examination without abnormal findings: Secondary | ICD-10-CM

## 2013-04-07 LAB — POCT UA - MICROSCOPIC ONLY
Crystals, Ur, HPF, POC: NEGATIVE
RBC, urine, microscopic: NEGATIVE

## 2013-04-07 LAB — POCT URINALYSIS DIPSTICK
Glucose, UA: NEGATIVE
Ketones, UA: NEGATIVE
Leukocytes, UA: NEGATIVE
Protein, UA: NEGATIVE

## 2013-04-07 LAB — CBC WITH DIFFERENTIAL/PLATELET
Eosinophils Absolute: 0.2 10*3/uL (ref 0.0–0.7)
Hemoglobin: 15.5 g/dL (ref 13.0–17.0)
Lymphocytes Relative: 26 % (ref 12–46)
Lymphs Abs: 2 10*3/uL (ref 0.7–4.0)
Neutro Abs: 5.1 10*3/uL (ref 1.7–7.7)
Neutrophils Relative %: 64 % (ref 43–77)
Platelets: 217 10*3/uL (ref 150–400)
RBC: 4.94 MIL/uL (ref 4.22–5.81)
WBC: 7.9 10*3/uL (ref 4.0–10.5)

## 2013-04-07 LAB — LIPID PANEL
LDL Cholesterol: 62 mg/dL (ref 0–99)
VLDL: 26 mg/dL (ref 0–40)

## 2013-04-07 LAB — COMPREHENSIVE METABOLIC PANEL
ALT: 42 U/L (ref 0–53)
Albumin: 4.5 g/dL (ref 3.5–5.2)
Alkaline Phosphatase: 49 U/L (ref 39–117)
Potassium: 4.6 mEq/L (ref 3.5–5.3)
Sodium: 138 mEq/L (ref 135–145)
Total Bilirubin: 1.1 mg/dL (ref 0.3–1.2)
Total Protein: 7.3 g/dL (ref 6.0–8.3)

## 2013-04-07 LAB — POCT GLYCOSYLATED HEMOGLOBIN (HGB A1C): Hemoglobin A1C: 6.3

## 2013-04-07 LAB — GLUCOSE, POCT (MANUAL RESULT ENTRY): POC Glucose: 129 mg/dl — AB (ref 70–99)

## 2013-04-07 LAB — IFOBT (OCCULT BLOOD): IFOBT: NEGATIVE

## 2013-04-07 LAB — TSH: TSH: 4.269 u[IU]/mL (ref 0.350–4.500)

## 2013-04-07 LAB — HEPATITIS C ANTIBODY: HCV Ab: NEGATIVE

## 2013-04-07 MED ORDER — GLIPIZIDE ER 2.5 MG PO TB24: 2.5000 mg | ORAL_TABLET | Freq: Every day | ORAL | Status: DC

## 2013-04-07 MED ORDER — SIMVASTATIN 40 MG PO TABS: ORAL_TABLET | ORAL | Status: DC

## 2013-04-07 MED ORDER — ZOSTER VACCINE LIVE 19400 UNT/0.65ML ~~LOC~~ SOLR: 0.6500 mL | Freq: Once | SUBCUTANEOUS | Status: DC

## 2013-04-07 NOTE — Patient Instructions (Addendum)

## 2013-04-07 NOTE — Progress Notes (Signed)
Subjective:    Patient ID: Danny [name], male    DOB: 12-13-1951, 62 y.o.   MRN: [id number]  HPI This 62 y.o. male presents for Annual Wellness Exam.  Frequency of home glucose monitoring: QD most days. Occasional reading above 200, most 90-180. Home diary marked for scanning. Sees a dentist Q6 months, eye specialist annually.  Checks feet daily.  Is current with influenza vaccine.  Is current with pneumococcal vaccine.   Past Medical History  Diagnosis Date  . Type II or unspecified type diabetes mellitus without mention of complication, not stated as uncontrolled   . Mixed hyperlipidemia   . OA (osteoarthritis) of knee     right  . Essential hypertension, benign   . Obesity, unspecified     Past Surgical History  Procedure Laterality Date  . Knee arthroscopy      right  . Ganglion cyst excision  age 45 years    right  . Wrist surgery  04/2012    Prior to Admission medications   Medication Sig Start Date End Date Taking? Authorizing Provider  aspirin 81 MG tablet Take 81 mg by mouth daily.     Yes Historical Provider, MD  enalapril (VASOTEC) 2.5 MG tablet Take 1 tablet (2.5 mg total) by mouth daily. 03/16/13  Yes Ryan M Dunn, PA-C  Ginger, Zingiber officinalis, 550 MG CAPS Take 550 mg by mouth daily.     Yes Historical Provider, MD  glipiZIDE (GLIPIZIDE XL) 2.5 MG 24 hr tablet Take 1 tablet (2.5 mg total) by mouth daily. 04/07/13  Yes Makailee Nudelman S Gailen Venne, PA-C  Glucosamine-Chondroit-Vit C-Mn (GLUCOSAMINE CHONDR 1500 COMPLX PO) Take 1,500 mg by mouth daily.     Yes Historical Provider, MD  simvastatin (ZOCOR) 40 MG tablet Take 1/2 tablet daily 04/07/13  Yes Shabana Armentrout S Schneur Crowson, PA-C  sitaGLIPtan-metformin (JANUMET) 50-1000 MG per tablet Take 1 tablet by mouth 2 (two) times daily with a meal. 03/16/13  Yes Ryan M Dunn, PA-C  vitamin C (ASCORBIC ACID) 500 MG tablet Take 500 mg by mouth daily.     Yes Historical Provider, MD  zoster vaccine live, PF, (ZOSTAVAX) 40981 UNT/0.65ML  injection Inject 19,400 Units into the skin once. 04/07/13   Nathanial Arrighi Tessa Lerner, PA-C    Allergies  Allergen Reactions  . Codeine Nausea And Vomiting  . Simvastatin Other (See Comments)    Myalgias; tolerates a lower dose    History   Social History  . Marital Status: Married    Spouse Name: Darel Hong    Number of Children: 3  . Years of Education: 16   Occupational History  . Engineer    Social History Main Topics  . Smoking status: Former Smoker -- 1.50 packs/day for 15 years    Types: Cigarettes    Quit date: 12/17/1982  . Smokeless tobacco: Never Used     Comment: over 40 yrs  . Alcohol Use: Yes     Comment: very rare  . Drug Use: No  . Sexually Active: Yes -- Male partner(s)   Other Topics Concern  . Not on file   Social History Narrative   Valrie Hart, motorcycle rider.  He lives with his wife.  He has 3 adult children.  His wife had 2 children, one of whom is deceased.    Family History  Problem Relation Age of Onset  . Diabetes Mother   . Diabetes Sister   . Diabetes Daughter   . Cancer Father   . Heart disease Father  Review of Systems  Constitutional: Negative.   HENT: Negative.   Eyes: Negative.   Respiratory: Negative.   Cardiovascular: Negative.   Gastrointestinal: Negative.   Genitourinary: Negative.   Musculoskeletal: Negative.   Skin: Negative.   Neurological: Negative.   Psychiatric/Behavioral: Negative.        Objective:   Physical Exam  Vitals reviewed. Constitutional: He is oriented to person, place, and time. Vital signs are normal. He appears well-developed and well-nourished. He is active and cooperative.  Non-toxic appearance. He does not have a sickly appearance. He does not appear ill. No distress.  HENT:  Head: Normocephalic and atraumatic. No trismus in the jaw.  Right Ear: Hearing, tympanic membrane, external ear and ear canal normal.  Left Ear: Hearing, tympanic membrane, external ear and ear canal normal.  Nose: Nose  normal.  Mouth/Throat: Uvula is midline, oropharynx is clear and moist and mucous membranes are normal. He does not have dentures. No oral lesions. Normal dentition. No dental abscesses, edematous, lacerations or dental caries.  Eyes: Conjunctivae and EOM are normal. Pupils are equal, round, and reactive to light. Right eye exhibits no discharge. Left eye exhibits no discharge. No scleral icterus.  Fundoscopic exam:      The right eye shows no arteriolar narrowing, no AV nicking, no exudate, no hemorrhage and no papilledema.       The left eye shows no arteriolar narrowing, no AV nicking, no exudate, no hemorrhage and no papilledema.  Neck: Normal range of motion, full passive range of motion without pain and phonation normal. Neck supple. No spinous process tenderness and no muscular tenderness present. No rigidity. No tracheal deviation, no edema, no erythema and normal range of motion present. No thyromegaly present.  Cardiovascular: Normal rate, regular rhythm, S1 normal, S2 normal, normal heart sounds, intact distal pulses and normal pulses.  Exam reveals no gallop and no friction rub.   No murmur heard. Pulmonary/Chest: Effort normal and breath sounds normal. No respiratory distress. He has no wheezes. He has no rales.  Abdominal: Soft. Normal appearance and bowel sounds are normal. He exhibits no distension and no mass. There is no hepatosplenomegaly. There is no tenderness. There is no rebound and no guarding. No hernia. Hernia confirmed negative in the right inguinal area and confirmed negative in the left inguinal area.  Genitourinary: Rectum normal, prostate normal, testes normal and penis normal. Guaiac negative stool. Circumcised. No phimosis, paraphimosis, hypospadias, penile erythema or penile tenderness. No discharge found.  Musculoskeletal: Normal range of motion. He exhibits no edema and no tenderness.       Right shoulder: Normal.       Left shoulder: Normal.       Right elbow:  Normal.      Left elbow: Normal.       Right wrist: Normal.       Left wrist: Normal.       Right hip: Normal.       Left hip: Normal.       Right knee: Normal.       Left knee: Normal.       Right ankle: Normal. Achilles tendon normal.       Left ankle: Normal. Achilles tendon normal.       Cervical back: Normal. He exhibits normal range of motion, no tenderness, no bony tenderness, no swelling, no edema, no deformity, no laceration, no pain, no spasm and normal pulse.       Thoracic back: Normal.  Lumbar back: Normal.       Right upper arm: Normal.       Left upper arm: Normal.       Right forearm: Normal.       Left forearm: Normal.       Right hand: Normal.       Left hand: Normal.       Right upper leg: Normal.       Left upper leg: Normal.       Right lower leg: Normal.       Left lower leg: Normal.       Right foot: Normal.       Left foot: Normal.  Lymphadenopathy:       Head (right side): No submental, no submandibular, no tonsillar, no preauricular, no posterior auricular and no occipital adenopathy present.       Head (left side): No submental, no submandibular, no tonsillar, no preauricular, no posterior auricular and no occipital adenopathy present.    He has no cervical adenopathy.       Right: No inguinal and no supraclavicular adenopathy present.       Left: No inguinal and no supraclavicular adenopathy present.  Neurological: He is alert and oriented to person, place, and time. He has normal strength and normal reflexes. He displays no tremor. No cranial nerve deficit. He exhibits normal muscle tone. Coordination and gait normal.  Skin: Skin is warm, dry and intact. No abrasion, no ecchymosis, no laceration, no lesion and no rash noted. He is not diaphoretic. No cyanosis or erythema. No pallor. Nails show no clubbing.  Psychiatric: He has a normal mood and affect. His speech is normal and behavior is normal. Judgment and thought content normal. Cognition and  memory are normal.   See DM foot exam.  Results for orders placed in visit on 04/07/13  IFOBT (OCCULT BLOOD)      Result Value Range   IFOBT Negative    GLUCOSE, POCT (MANUAL RESULT ENTRY)      Result Value Range   POC Glucose 129 (*) 70 - 99 mg/dl  POCT GLYCOSYLATED HEMOGLOBIN (HGB A1C)      Result Value Range   Hemoglobin A1C 6.3    POCT UA - MICROSCOPIC ONLY      Result Value Range   WBC, Ur, HPF, POC 0-2     RBC, urine, microscopic neg     Bacteria, U Microscopic neg     Mucus, UA neg     Epithelial cells, urine per micros 0-1     Crystals, Ur, HPF, POC neg     Casts, Ur, LPF, POC neg     Yeast, UA neg    POCT URINALYSIS DIPSTICK      Result Value Range   Color, UA yellow     Clarity, UA clear     Glucose, UA neg     Bilirubin, UA neg     Ketones, UA neg     Spec Grav, UA 1.025     Blood, UA neg     pH, UA 5.0     Protein, UA neg     Urobilinogen, UA 0.2     Nitrite, UA neg     Leukocytes, UA Negative          Assessment & Plan:  Routine general medical examination at a health care facility - Age appropriate anticipatory guidance provided.  Essential hypertension, benign - CONTROLLED.  Contine current treatment. Plan: CBC with Differential, POCT  UA - Microscopic Only, POCT urinalysis dipstick, TSH  Mixed hyperlipidemia - Plan: Lipid panel  OA (osteoarthritis) of knee - stable.  Type II or unspecified type diabetes mellitus without mention of complication, not stated as uncontrolled - CONTROLLED.  Continue current treatment. Plan: POCT glucose (manual entry), POCT glycosylated hemoglobin (Hb A1C), Microalbumin, urine, Comprehensive metabolic panel, glipiZIDE (GLIPIZIDE XL) 2.5 MG 24 hr tablet, simvastatin (ZOCOR) 40 MG tablet  Screening for prostate cancer - Plan: PSA  Screening for colon cancer - Plan: IFOBT POC (occult bld, rslt in office)  Need for hepatitis C screening test - Plan: Hepatitis C antibody  Need for shingles vaccine - Plan: zoster vaccine  live, PF, (ZOSTAVAX) 16109 UNT/0.65ML injection  Fernande Bras, PA-C Physician Assistant-Certified [facility]

## 2013-04-08 LAB — MICROALBUMIN, URINE: Microalb, Ur: 0.5 mg/dL (ref 0.00–1.89)

## 2013-06-16 ENCOUNTER — Other Ambulatory Visit: Payer: Self-pay | Admitting: Physician Assistant

## 2013-06-16 ENCOUNTER — Encounter: Payer: Self-pay | Admitting: Physician Assistant

## 2013-06-16 MED ORDER — SITAGLIPTIN PHOS-METFORMIN HCL 50-1000 MG PO TABS: 1.0000 | ORAL_TABLET | Freq: Two times a day (BID) | ORAL | Status: DC

## 2013-06-16 MED ORDER — ENALAPRIL MALEATE 2.5 MG PO TABS: 2.5000 mg | ORAL_TABLET | Freq: Every day | ORAL | Status: DC

## 2013-06-16 NOTE — Telephone Encounter (Signed)
Sent in

## 2013-06-17 ENCOUNTER — Other Ambulatory Visit: Payer: Self-pay | Admitting: Physician Assistant

## 2013-06-18 NOTE — Telephone Encounter (Signed)
This was sent for you on 06/16/13

## 2013-07-10 IMAGING — CR DG RIBS W/ CHEST 3+V*R*
3 series · 3 of 3 positions shown · non-contrast
Comparison: None.

CLINICAL DATA: Inferolateral rib pain.

RIGHT RIBS AND CHEST - 3+ VIEW

[PA (1 of 3)]
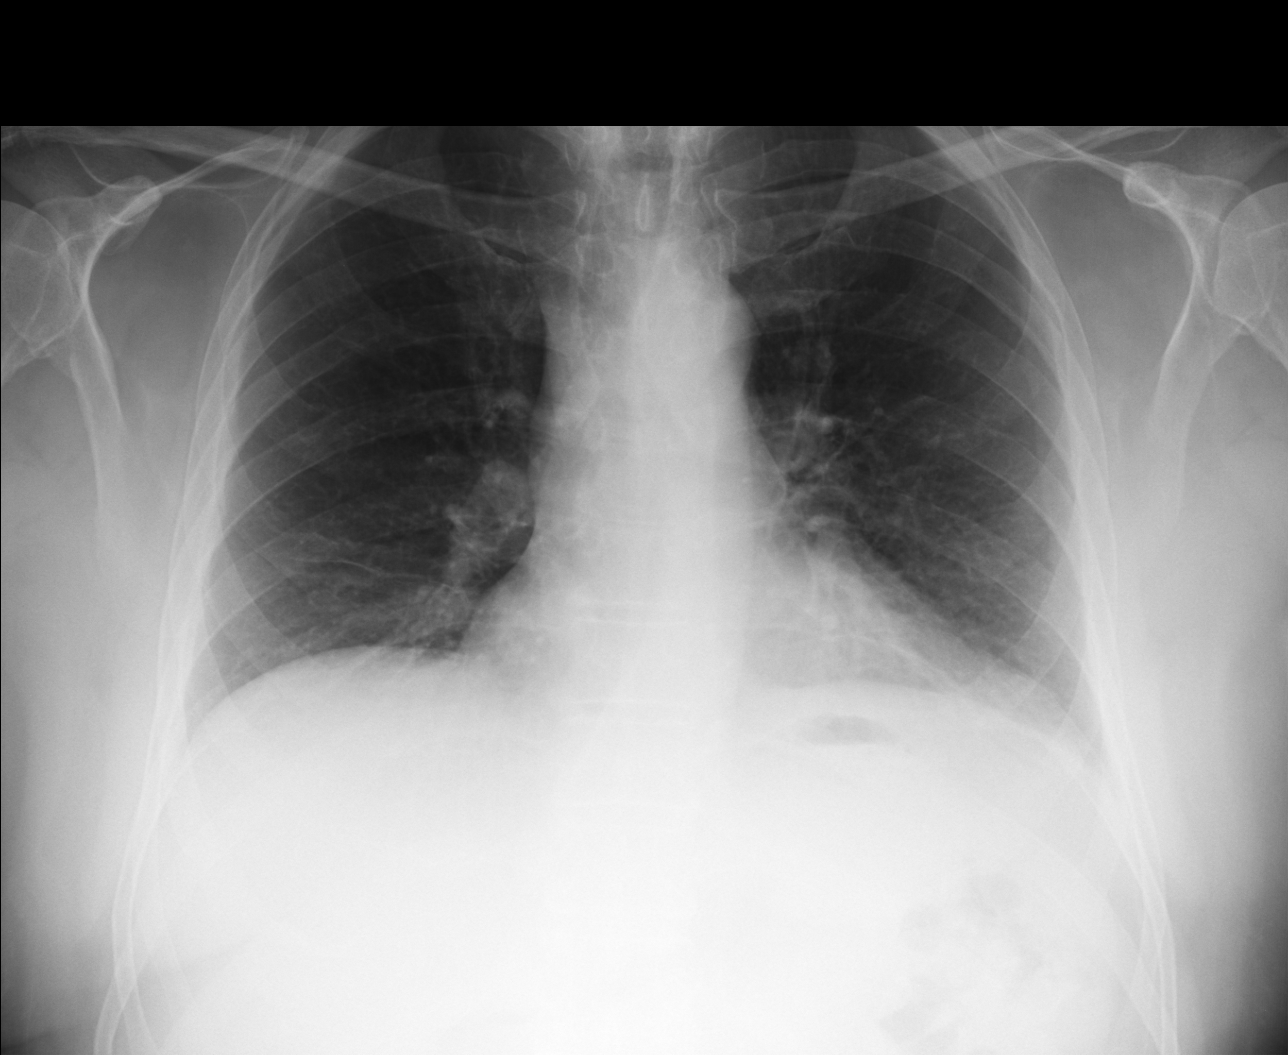

[PA (2 of 3)]
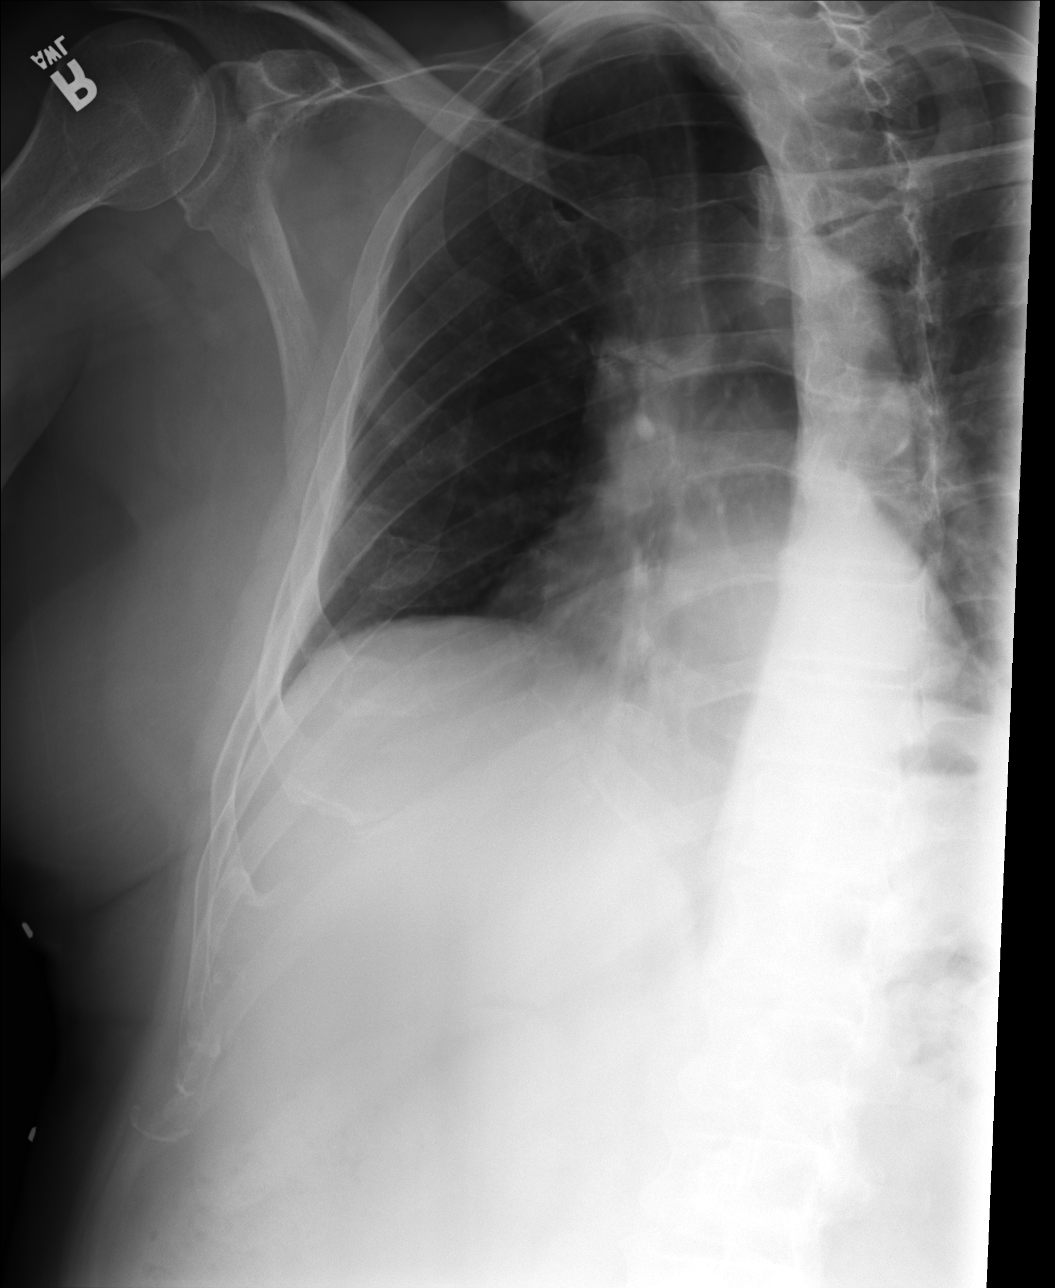

[PA (3 of 3)]
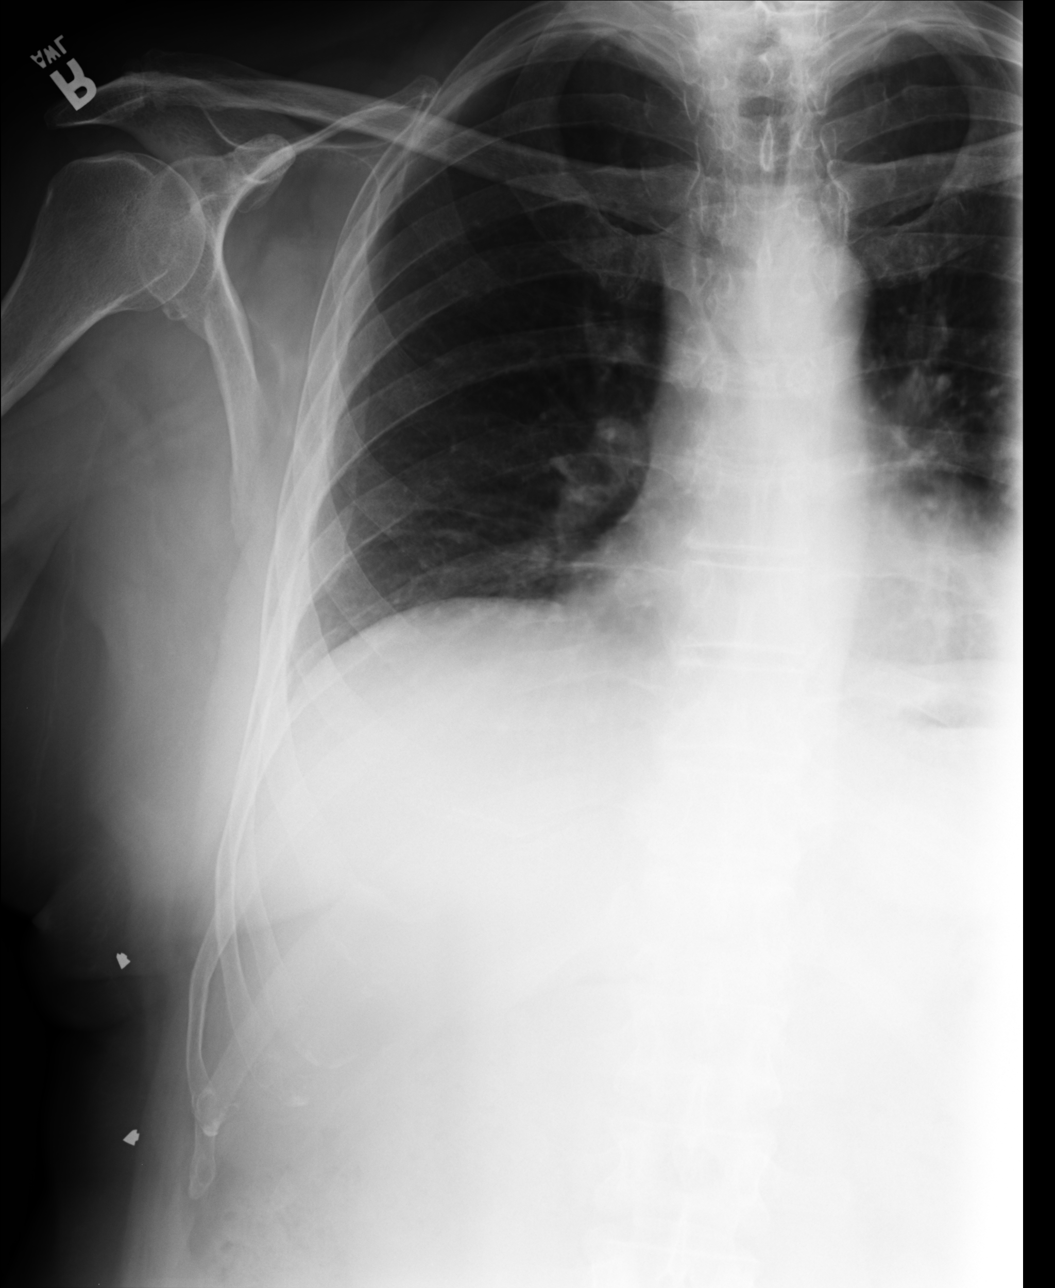

[3 of 3 positions shown; findings below may reference images not displayed]

FINDINGS: There is no evidence of right-sided rib fracture, pleural
effusion or pneumothorax.  There are low lung volumes with
bibasilar atelectasis.  The lungs are otherwise clear.  Heart size
and mediastinal contours are normal for the degree of inspiration.
IMPRESSION: No evidence of right-sided rib fracture, pleural effusion or
pneumothorax.  Bibasilar atelectasis.

Clinically significant discrepancy from primary report, if
provided: None

## 2013-07-14 ENCOUNTER — Ambulatory Visit (INDEPENDENT_AMBULATORY_CARE_PROVIDER_SITE_OTHER): Payer: 59 | Admitting: Physician Assistant

## 2013-07-14 ENCOUNTER — Encounter: Payer: Self-pay | Admitting: Physician Assistant

## 2013-07-14 VITALS — BP 120/76 | HR 80 | Temp 98.4°F | Resp 16 | Ht 70.0 in | Wt 223.8 lb

## 2013-07-14 DIAGNOSIS — E119 Type 2 diabetes mellitus without complications: Secondary | ICD-10-CM

## 2013-07-14 DIAGNOSIS — E669 Obesity, unspecified: Secondary | ICD-10-CM

## 2013-07-14 DIAGNOSIS — E782 Mixed hyperlipidemia: Secondary | ICD-10-CM

## 2013-07-14 DIAGNOSIS — I10 Essential (primary) hypertension: Secondary | ICD-10-CM

## 2013-07-14 LAB — POCT GLYCOSYLATED HEMOGLOBIN (HGB A1C): Hemoglobin A1C: 6.1

## 2013-07-14 LAB — COMPREHENSIVE METABOLIC PANEL
Albumin: 4.5 g/dL (ref 3.5–5.2)
Alkaline Phosphatase: 49 U/L (ref 39–117)
BUN: 14 mg/dL (ref 6–23)
Calcium: 10.4 mg/dL (ref 8.4–10.5)
Glucose, Bld: 102 mg/dL — ABNORMAL HIGH (ref 70–99)
Potassium: 4.8 mEq/L (ref 3.5–5.3)

## 2013-07-14 LAB — GLUCOSE, POCT (MANUAL RESULT ENTRY): POC Glucose: 112 mg/dl — AB (ref 70–99)

## 2013-07-14 LAB — LIPID PANEL
Cholesterol: 127 mg/dL (ref 0–200)
Total CHOL/HDL Ratio: 2.8 Ratio

## 2013-07-14 NOTE — Patient Instructions (Signed)
Keep up the great work! Be sure that you are making healthy choices for exercise and weight loss, even when you travel.

## 2013-07-14 NOTE — Progress Notes (Signed)
I have examined this patient along with the student and agree.  See DM foot exam. Counseled regarding healthy eating choices and regular exercise. Current with pneumococcal and flu vaccines. Has Rx for Zostavax, but hasn't received the injection yet.

## 2013-07-14 NOTE — Progress Notes (Signed)
  Subjective:    Patient ID: Danny [name], male    DOB: [birth year], 62 y.o.   MRN: [id number]  HPI 62 y.o. Male presents to clinic today for f/u of his hypertension, diabetes, and high cholesterol. Patient is doing great today with no complaints. He had cataract surgery and the beginning of June ( 1 month ago) and is doing well. He is continuing to take medications as directed. He is working on [employer] and exercising.    Review of Systems  Constitutional: Negative for fever, activity change, appetite change, fatigue and unexpected weight change.  Eyes: Negative for visual disturbance.  Respiratory: Negative for chest tightness and shortness of breath.   Cardiovascular: Negative for chest pain and leg swelling.  Gastrointestinal: Negative for nausea, vomiting, abdominal pain, diarrhea and constipation.  Genitourinary: Negative for difficulty urinating.  Musculoskeletal: Negative for myalgias and arthralgias.  Skin: Negative for pallor and rash.  Neurological: Negative for light-headedness and headaches.  All other systems reviewed and are negative.      Objective:   Physical Exam  Nursing note and vitals reviewed. Constitutional: He is oriented to person, place, and time. Vital signs are normal. He appears well-developed and well-nourished. No distress.  HENT:  Head: Normocephalic and atraumatic.  Right Ear: External ear normal.  Left Ear: External ear normal.  Nose: Nose normal.  Eyes: Conjunctivae and lids are normal.  Neck: Trachea normal and normal range of motion. Neck supple. No thyromegaly present.  Cardiovascular: Normal rate, regular rhythm, normal heart sounds and intact distal pulses.   Pulmonary/Chest: Effort normal and breath sounds normal.  Musculoskeletal: Normal range of motion.  Lymphadenopathy:    He has no cervical adenopathy.  Neurological: He is alert and oriented to person, place, and time.  Skin: Skin is warm and dry.   Psychiatric: He has a normal mood and affect. His speech is normal and behavior is normal. Judgment and thought content normal. Cognition and memory are normal.     Results for orders placed in visit on 07/14/13  GLUCOSE, POCT (MANUAL RESULT ENTRY)      Result Value Range   POC Glucose 112 (*) 70 - 99 mg/dl  POCT GLYCOSYLATED HEMOGLOBIN (HGB A1C)      Result Value Range   Hemoglobin A1C 6.1          Assessment & Plan:  Essential hypertension, benign. Continue medications as directed. Control with diet and exercise.  Mixed hyperlipidemia - Plan: Lipid panel. Continue medications as directed.   Type II or unspecified type diabetes mellitus without mention of complication, not stated as uncontrolled - Plan: POCT glucose (manual entry), Comprehensive metabolic panel, POCT glycosylated hemoglobin (Hb A1C) . Continue medications as directed.   Obesity (BMI 30-39.9)- discussed with patient about making healthy choices for exercise and weight loss, even when he travels for work.   Will contact patient with lab results. Instructed him to call if he does not hear from [place] within 2 weeks. F/u in 3 months or sooner if needed

## 2013-09-14 ENCOUNTER — Encounter: Payer: Self-pay | Admitting: Physician Assistant

## 2013-09-14 ENCOUNTER — Other Ambulatory Visit: Payer: Self-pay | Admitting: Physician Assistant

## 2013-09-15 MED ORDER — GLUCOSE BLOOD VI STRP: ORAL_STRIP | Status: DC

## 2013-09-15 NOTE — Addendum Note (Signed)
Addended byCaffie Damme on: 09/15/2013 09:48 AM   Modules accepted: Orders

## 2013-10-20 ENCOUNTER — Ambulatory Visit: Payer: 59 | Admitting: Physician Assistant

## 2013-10-20 ENCOUNTER — Ambulatory Visit (INDEPENDENT_AMBULATORY_CARE_PROVIDER_SITE_OTHER): Payer: 59 | Admitting: Physician Assistant

## 2013-10-20 ENCOUNTER — Encounter: Payer: Self-pay | Admitting: Physician Assistant

## 2013-10-20 VITALS — BP 120/80 | HR 77 | Temp 97.5°F | Resp 16 | Ht 69.75 in | Wt 223.0 lb

## 2013-10-20 DIAGNOSIS — E669 Obesity, unspecified: Secondary | ICD-10-CM

## 2013-10-20 DIAGNOSIS — I10 Essential (primary) hypertension: Secondary | ICD-10-CM

## 2013-10-20 DIAGNOSIS — E119 Type 2 diabetes mellitus without complications: Secondary | ICD-10-CM

## 2013-10-20 DIAGNOSIS — R251 Tremor, unspecified: Secondary | ICD-10-CM

## 2013-10-20 DIAGNOSIS — R4589 Other symptoms and signs involving emotional state: Secondary | ICD-10-CM

## 2013-10-20 DIAGNOSIS — E782 Mixed hyperlipidemia: Secondary | ICD-10-CM

## 2013-10-20 DIAGNOSIS — F39 Unspecified mood [affective] disorder: Secondary | ICD-10-CM

## 2013-10-20 DIAGNOSIS — R259 Unspecified abnormal involuntary movements: Secondary | ICD-10-CM

## 2013-10-20 LAB — COMPREHENSIVE METABOLIC PANEL
AST: 18 U/L (ref 0–37)
Albumin: 4.3 g/dL (ref 3.5–5.2)
Alkaline Phosphatase: 46 U/L (ref 39–117)
BUN: 18 mg/dL (ref 6–23)
Calcium: 9.6 mg/dL (ref 8.4–10.5)
Chloride: 104 mEq/L (ref 96–112)
Glucose, Bld: 70 mg/dL (ref 70–99)
Potassium: 4.9 mEq/L (ref 3.5–5.3)
Sodium: 136 mEq/L (ref 135–145)
Total Protein: 6.8 g/dL (ref 6.0–8.3)

## 2013-10-20 LAB — GLUCOSE, POCT (MANUAL RESULT ENTRY): POC Glucose: 91 mg/dl (ref 70–99)

## 2013-10-20 LAB — POCT GLYCOSYLATED HEMOGLOBIN (HGB A1C): Hemoglobin A1C: 6.1

## 2013-10-20 NOTE — Progress Notes (Signed)
Subjective:    Patient ID: Danny [name], male    DOB: August 12, 1951, 62 y.o.   MRN: [id number]  HPI Mr. Feagans is a 62 YO male with a history of DM, HL, HTN who presents today with his wife for 30-month Diabetes follow-up and onset of resting tremor of right hand, gait disturbance, and rigidity.  The patient's wife as well as a few of the couple's friends have noticed changes in the patient including acting "less confident" and "dazed" at times, hesitancy in his speech, walking slower with a stiff upper body, as well as a right hand tremor at rest over the past few months.  The patient has not noticed these things himself but does state he has noticed his right thumb shaking when using it such as using his phone.    The patient has DM and checks his BS QD ranging typically from 80-120 but states they have been over 170 2-3 times over the past 3 months.  He currently takes Janumet and Glipizide.  He currently has no regular exercise routine and tries to limit the sweets in his diet.  He denies any numbness, tingling, chest pain, shortness of breath, edema, or skin ulcers. He has recently seen his eye doctor within the past month.    Patient Active Problem List   Diagnosis Date Noted  . Obesity (BMI 30-39.9) 07/14/2013  . Confusional arousals 02/13/2013  . Disorder of ligament of right wrist 01/29/2012  . Type II or unspecified type diabetes mellitus without mention of complication, not stated as uncontrolled 10/09/2011  . Mixed hyperlipidemia 10/09/2011  . OA (osteoarthritis) of knee 10/09/2011  . Essential hypertension, benign 10/09/2011      Review of Systems  Constitutional: Negative for fever, chills and fatigue.  Respiratory: Negative for cough, chest tightness, shortness of breath and wheezing.   Cardiovascular: Negative for chest pain.  Gastrointestinal: Negative for nausea, vomiting and abdominal pain.  Neurological: Positive for tremors and speech difficulty. Negative for  dizziness, seizures, syncope, weakness, light-headedness, numbness and headaches.       Objective:   Physical Exam Mr. Isabell  is a pleasant well-developed, well-nourished male  in no acute distress Heart: Normal rate and rhythm, no murmurs, rubs, or gallops Lungs: Clear to auscultation bilaterally, no wheezing, rhonchi, or rales Extremities: No cyanosis or edema noted EENT: External ear and ear canals clear with no erythema noted and TM visible bilaterally.  Nasal mucosa non-erythematous and non-swollen.  Oral pharynx non erythematous with no tonsillar swelling or exudate.  Neck supple with no cervical lymphadenopathy present.  Frontal sinuses non- tender to palpation.  Neuro: Alert, oriented x 3, CN II-XII grossly intact, full sensation to extremities bilaterally.  2+ strength and equal bilaterally. DTRs 2+ and equal bilaterally. No cerebellar deficit or tremor noted on rapid alternating movements.   BP 120/80  Pulse 77  Temp(Src) 97.5 F (36.4 C) (Oral)  Resp 16  Ht 5' 9.75" (1.772 m)  Wt 223 lb (101.152 kg)  BMI 32.21 kg/m2  SpO2 98%   Results for orders placed in visit on 10/20/13  GLUCOSE, POCT (MANUAL RESULT ENTRY)      Result Value Range   POC Glucose 91  70 - 99 mg/dl  POCT GLYCOSYLATED HEMOGLOBIN (HGB A1C)      Result Value Range   Hemoglobin A1C 6.1     See diabetic foot exam    Assessment & Plan:  Type II or unspecified type diabetes mellitus without mention of complication, not stated  as uncontrolled - Plan: Continue current plan: HM Diabetes Foot Exam, POCT glucose (manual entry), POCT glycosylated hemoglobin (Hb A1C), Comprehensive metabolic panel  Obesity (BMI 30-39.9): Recommend daily exercise including 150 minutes/week  Mixed hyperlipidemia: Continue current plan: Simvastatin 40 mg  Essential hypertension, benign: Continue current plan: Enalapril 2.5 mg  Tremor - Plan: Ambulatory referral to Neurology  Disturbances of affect - Plan: Ambulatory referral  to Neurology-please contact us if you have not been contacted to set up this appointment

## 2013-10-21 ENCOUNTER — Encounter: Payer: Self-pay | Admitting: Physician Assistant

## 2013-10-21 NOTE — Progress Notes (Signed)
I have examined this patient along with the student and agree.  He has a mild intention tremor of the RIGHT hand.  Concern for early Parkinsonian syndrome, given his wife's report of rigidity, though none appreciated today.

## 2013-10-25 ENCOUNTER — Other Ambulatory Visit: Payer: Self-pay | Admitting: Physician Assistant

## 2013-10-26 ENCOUNTER — Encounter: Payer: Self-pay | Admitting: Physician Assistant

## 2013-10-26 ENCOUNTER — Encounter: Payer: Self-pay | Admitting: Neurology

## 2013-10-26 ENCOUNTER — Ambulatory Visit (INDEPENDENT_AMBULATORY_CARE_PROVIDER_SITE_OTHER): Payer: 59 | Admitting: Neurology

## 2013-10-26 VITALS — BP 125/83 | HR 83 | Ht 71.0 in | Wt 225.0 lb

## 2013-10-26 DIAGNOSIS — G4752 REM sleep behavior disorder: Secondary | ICD-10-CM

## 2013-10-26 DIAGNOSIS — G20 Parkinson's disease: Secondary | ICD-10-CM

## 2013-10-26 DIAGNOSIS — K117 Disturbances of salivary secretion: Secondary | ICD-10-CM

## 2013-10-26 DIAGNOSIS — G20C Parkinsonism, unspecified: Secondary | ICD-10-CM

## 2013-10-26 NOTE — Progress Notes (Signed)
[facility]    Provider:  Dr Hosie Poisson Referring Provider: Carmelina Paddock Primary Care Physician:  JEFFERY,CHELLE, PA-C  CC:  tremor  HPI:  Danny [name] is a 62 y.o. male here as a referral from [place], [place] for tremor evaluation  Patient feels he is asymptomatic, his wife notes that the symptoms for list the past 4-6 months. She notes tremor in his hands, masked face, change in his walking. She feels he is in good and bad days, reports is currently doing well. Tremor is predominantly in his right hand, minimal in the left. Appears to be both a rest action and postural tremor. He notes some stiffness of his muscles, generalized bradykinesia, slowing down of his walking. Feels more unsteady walking, has had some falls in the past. He feels her handwriting has gotten messier, no change in size, no micrographia. His wife notes his speech is more hesitant and slow, no hypophonia. He denies any difficulty sleeping, wife notes long history of REM behavior disorder. No constipation, no difficulty swallowing. He has some excessive saliva production. He does note a chronic lack of sense of smell.  No prior history of strokes. Does have DM. No exposure to dopamine blocking agents. No heavy metal exposure. Has well water. No family history of neurodegenerative disorders.   Review of Systems: Out of a complete 14 system review, the patient complains of only the following symptoms, and all other reviewed systems are negative. Positive for fatigue cramps aching muscles tremor decreased energy disinterest in activities  History   Social History  . Marital Status: Married    Spouse Name: Darel Hong    Number of Children: 3  . Years of Education: 16   Occupational History  . Engineer    Social History Main Topics  . Smoking status: Former Smoker -- 1.50 packs/day for 15 years    Types: Cigarettes    Quit date: 12/17/1982  . Smokeless tobacco: Never Used     Comment:  over 40 yrs  . Alcohol Use: Yes     Comment: very rare  . Drug Use: No  . Sexual Activity: Yes    Partners: Female   Other Topics Concern  . Not on file   Social History Narrative   Valrie Hart, motorcycle rider.  He lives with his wife.  He has 3 adult children.  His wife had 2 children, one of whom is deceased.    Family History  Problem Relation Age of Onset  . Diabetes Mother   . Diabetes Sister   . Diabetes Daughter   . Cancer Father   . Heart disease Father     Past Medical History  Diagnosis Date  . Type II or unspecified type diabetes mellitus without mention of complication, not stated as uncontrolled   . Mixed hyperlipidemia   . OA (osteoarthritis) of knee     right  . Essential hypertension, benign   . Obesity, unspecified     Past Surgical History  Procedure Laterality Date  . Knee arthroscopy      right  . Ganglion cyst excision  age 36 years    right  . Wrist surgery  04/2012    Current Outpatient Prescriptions  Medication Sig Dispense Refill  . aspirin 81 MG tablet Take 81 mg by mouth daily.        . enalapril (VASOTEC) 2.5 MG tablet Take 1 tablet by mouth  daily  30 tablet  2  . Ginger, Zingiber officinalis,  550 MG CAPS Take 550 mg by mouth daily.        Marland Kitchen glipiZIDE (GLIPIZIDE XL) 2.5 MG 24 hr tablet Take 1 tablet (2.5 mg total) by mouth daily.  90 tablet  3  . Glucosamine-Chondroit-Vit C-Mn (GLUCOSAMINE CHONDR 1500 COMPLX PO) Take 1,500 mg by mouth daily.        Marland Kitchen glucose blood test strip Use as instructed  300 each  4  . JANUMET 50-1000 MG per tablet Take 1 tablet by mouth  twice a day with meals  60 tablet  0  . simvastatin (ZOCOR) 40 MG tablet Take 1/2 tablet daily  45 tablet  3  . vitamin C (ASCORBIC ACID) 500 MG tablet Take 500 mg by mouth daily.        Marland Kitchen zoster vaccine live, PF, (ZOSTAVAX) 45409 UNT/0.65ML injection Inject 19,400 Units into the skin once.  1 each  0   No current facility-administered medications for this visit.     Allergies as of 10/26/2013 - Review Complete 10/26/2013  Allergen Reaction Noted  . Codeine Nausea And Vomiting 10/09/2011  . Simvastatin Other (See Comments) 10/09/2011    Vitals: BP 125/83  Pulse 83  Ht 5\' 11"  (1.803 m)  Wt 225 lb (102.059 kg)  BMI 31.39 kg/m2 Last Weight:  Wt Readings from Last 1 Encounters:  10/26/13 225 lb (102.059 kg)   Last Height:   Ht Readings from Last 1 Encounters:  10/26/13 5\' 11"  (1.803 m)     Physical exam: Exam: Gen: NAD, conversant Eyes: anicteric sclerae, moist conjunctivae HENT: Atraumatic, oropharynx clear Neck: Trachea midline; supple,  Lungs: CTA, no wheezing, rales, rhonic                          CV: RRR, no MRG Abdomen: Soft, non-tender;  Extremities: No peripheral edema  Skin: Normal temperature, no rash,  Psych: Appropriate affect, pleasant  Neuro: MS: AA&Ox3, appropriately interactive, normal affect   Speech: fluent w/o paraphasic error  Memory: good recent and remote recall  CN: PERRL, EOMI no nystagmus, no ptosis, sensation intact to LT V1-V3 bilat, face symmetric, no weakness, hearing grossly intact, palate elevates symmetrically, shoulder shrug 5/5 bilat,  tongue protrudes midline, no fasiculations noted.  Motor: normal bulk and tone Strength: 5/5  In all extremities  Reflexes: symmetrical, bilat downgoing toes  Sens: LT intact in all extremities  Brief Motor UPDRS  Speech: wnl  Facial Expression: mild masked facies, decreased blink rate  Tremor: Rest R 0 L 0 Action/postural R 1 L 0 Rigidity: Increased tone/cogwheel rigidity bilat UE R>L Finger taps:   R:1 L:1  Open/close hands: R:1 L:0  Foot taps: R:1 L:0  Gait/FOG: Arises from chair without pushing off, upright, minimal shuffling, decreased arm swing bilat R>L, negative Romberg, normal Pull test    Assessment:  After physical and neurologic examination, review of laboratory studies, imaging, neurophysiology testing and  pre-existing records, assessment will be reviewed on the problem list.  Plan:  Treatment plan and additional workup will be reviewed under Problem List.  1)Parkinsonism 2)RBD 3)Sialorrhea  61y/o gentleman presenting for initial evaluation of tremor, generalized bradykinesia and change in personality. Physical exam is pertinent for masked facies, minimal intention and postural tremor, bradykinesia right-sided predominant, decreased armswing bilaterally right dominant. Based on clinical history and physical exam there is concern for an underlying parkinsonism. The symptoms appear very mild at this time. Will check brain MRI to rule out vascular or structural cause.  Had an extensive discussion with patient and his wife about the possible diagnosis and different therapeutic options. At this time as the symptoms are very mild we will hold off on any medication. Will follow patient closely, followup in 4 months. If symptoms progress will consider starting Azilect or Dopamine Agonist.

## 2013-10-26 NOTE — Patient Instructions (Signed)
Overall you are doing fairly well but I do want to suggest a few things today:   Remember to drink plenty of fluid, eat healthy meals and do not skip any meals. Try to eat protein with a every meal and eat a healthy snack such as fruit or nuts in between meals. Try to keep a regular sleep-wake schedule and try to exercise daily, particularly in the form of walking, 20-30 minutes a day, if you can.   Your history and clinical exam are concerning for a parkinsonism. At this point we will continue to monitor you and hold off on medication.  As far as diagnostic testing:  1)Brain MRI  I would like to see you back in 4 months, sooner if we need to. Please call us with any interim questions, concerns, problems, updates or refill requests.   Please also call us for any test results so we can go over those with you on the phone.  My clinical assistant and will answer any of your questions and relay your messages to me and also relay most of my messages to you.   Our phone number is [phone]. We also have an after hours call service for urgent matters and there is a physician on-call for urgent questions. For any emergencies you know to call 911 or go to the nearest emergency room

## 2013-10-29 ENCOUNTER — Other Ambulatory Visit: Payer: 59

## 2013-11-04 ENCOUNTER — Ambulatory Visit (INDEPENDENT_AMBULATORY_CARE_PROVIDER_SITE_OTHER): Payer: 59

## 2013-11-04 DIAGNOSIS — G20 Parkinson's disease: Secondary | ICD-10-CM

## 2013-11-09 ENCOUNTER — Other Ambulatory Visit: Payer: Self-pay | Admitting: Physician Assistant

## 2013-11-16 ENCOUNTER — Encounter: Payer: Self-pay | Admitting: Physician Assistant

## 2013-11-23 ENCOUNTER — Encounter: Payer: Self-pay | Admitting: Physician Assistant

## 2013-12-21 ENCOUNTER — Encounter: Payer: Self-pay | Admitting: Neurology

## 2013-12-21 ENCOUNTER — Ambulatory Visit (INDEPENDENT_AMBULATORY_CARE_PROVIDER_SITE_OTHER): Payer: 59 | Admitting: Neurology

## 2013-12-21 VITALS — BP 128/81 | HR 81 | Ht 69.25 in | Wt 223.0 lb

## 2013-12-21 DIAGNOSIS — G4752 REM sleep behavior disorder: Secondary | ICD-10-CM

## 2013-12-21 DIAGNOSIS — G20 Parkinson's disease: Secondary | ICD-10-CM

## 2013-12-21 DIAGNOSIS — K117 Disturbances of salivary secretion: Secondary | ICD-10-CM

## 2013-12-21 MED ORDER — PRAMIPEXOLE DIHYDROCHLORIDE ER 0.375 MG PO TB24: ORAL_TABLET | ORAL | Status: DC

## 2013-12-21 NOTE — Progress Notes (Signed)
[facility]    Provider:  Dr Janann Colonel Referring Provider: Damita Lack Primary Care Physician:  JEFFERY,CHELLE, PA-C  CC:  tremor  HPI:  Danny [name] is a 63 y.o. male here as a return visit from [place], [place] for tremor evaluation. Last visit was 10/2013 at which time his symptoms are most consistent with a diagnosis of parkinsonism. He was not started on any medications at that time. Returns today for followup visit.  Patient feels like overall he is the same. Denies any worsening of tremor, he does note some increased stiffness and slowing down. Wife notes tremor is worse, notes he is moving slower, not walking as fast. No diffciulty sleeping, has REM behavior disorders. Wife notes some hesitancy in speech. Does have excessive saliva production though this is not bothersome to him.  Not very active, does not exercise on a regular basis.   Initial visit 10/2013: Patient feels he is asymptomatic, his wife notes that the symptoms for list the past 4-6 months. She notes tremor in his hands, masked face, change in his walking. She feels he is in good and bad days, reports is currently doing well. Tremor is predominantly in his right hand, minimal in the left. Appears to be both a rest action and postural tremor. He notes some stiffness of his muscles, generalized bradykinesia, slowing down of his walking. Feels more unsteady walking, has had some falls in the past. He feels her handwriting has gotten messier, no change in size, no micrographia. His wife notes his speech is more hesitant and slow, no hypophonia. He denies any difficulty sleeping, wife notes long history of REM behavior disorder. No constipation, no difficulty swallowing. He has some excessive saliva production. He does note a chronic lack of sense of smell.  No prior history of strokes. Does have DM. No exposure to dopamine blocking agents. No heavy metal exposure. Has well water. No family  history of neurodegenerative disorders.   Review of Systems: Out of a complete 14 system review, the patient complains of only the following symptoms, and all other reviewed systems are negative. Positive for tremor or sleep talking and acting out dreams drooling  History   Social History  . Marital Status: Married    Spouse Name: Danny [name]    Number of Children: 3  . Years of Education: 16   Occupational History  . Engineer    Social History Main Topics  . Smoking status: Former Smoker -- 1.50 packs/day for 15 years    Types: Cigarettes    Quit date: 12/17/1982  . Smokeless tobacco: Never Used     Comment: over 40 yrs  . Alcohol Use: Yes     Comment: very rare  . Drug Use: No  . Sexual Activity: Yes    Partners: Female   Other Topics Concern  . Not on file   Social History Narrative   Danny [name], motorcycle rider.  He lives with his wife Danny [name]).  He has 3 adult children.  His wife had 2 children, one of whom is deceased.   Patient is working full-time.   Patient has a Bachelor's degree.   Patient is right-handed.   Patient drinks 6 cups of coffee daily.    Family History  Problem Relation Age of Onset  . Diabetes Mother   . Diabetes Sister   . Diabetes Daughter   . Cancer Father   . Heart disease Father     Past Medical History  Diagnosis Date  .  Type II or unspecified type diabetes mellitus without mention of complication, not stated as uncontrolled   . Mixed hyperlipidemia   . OA (osteoarthritis) of knee     right  . Essential hypertension, benign   . Obesity, unspecified     Past Surgical History  Procedure Laterality Date  . Knee arthroscopy      right  . Ganglion cyst excision  age 89 years    right  . Wrist surgery  04/2012    Current Outpatient Prescriptions  Medication Sig Dispense Refill  . aspirin 81 MG tablet Take 81 mg by mouth daily.        . enalapril (VASOTEC) 2.5 MG tablet Take 1 tablet by mouth  daily  90 tablet  0  . Ginger,  Zingiber officinalis, 550 MG CAPS Take 550 mg by mouth daily.        Marland Kitchen glipiZIDE (GLIPIZIDE XL) 2.5 MG 24 hr tablet Take 1 tablet (2.5 mg total) by mouth daily.  90 tablet  3  . Glucosamine-Chondroit-Vit C-Mn (GLUCOSAMINE CHONDR 1500 COMPLX PO) Take 1,500 mg by mouth daily.        Marland Kitchen glucose blood test strip Use as instructed  300 each  4  . JANUMET 50-1000 MG per tablet Take 1 tablet by mouth  twice a day with meals  60 tablet  2  . simvastatin (ZOCOR) 40 MG tablet Take 1/2 tablet daily  45 tablet  3  . vitamin C (ASCORBIC ACID) 500 MG tablet Take 500 mg by mouth daily.        Marland Kitchen zoster vaccine live, PF, (ZOSTAVAX) 36144 UNT/0.65ML injection Inject 19,400 Units into the skin once.  1 each  0  . Pramipexole Dihydrochloride 0.375 MG TB24 Take one tablet daily at bedtime  30 tablet  6   No current facility-administered medications for this visit.    Allergies as of 12/21/2013 - Review Complete 12/21/2013  Allergen Reaction Noted  . Codeine Nausea And Vomiting 10/09/2011  . Simvastatin Other (See Comments) 10/09/2011    Vitals: BP 128/81  Pulse 81  Ht 5' 9.25" (1.759 m)  Wt 223 lb (101.152 kg)  BMI 32.69 kg/m2 Last Weight:  Wt Readings from Last 1 Encounters:  12/21/13 223 lb (101.152 kg)   Last Height:   Ht Readings from Last 1 Encounters:  12/21/13 5' 9.25" (1.759 m)     Physical exam: Exam: Gen: NAD, conversant Eyes: anicteric sclerae, moist conjunctivae HENT: Atraumatic, oropharynx clear Neck: Trachea midline; supple,  Lungs: CTA, no wheezing, rales, rhonic                          CV: RRR, no MRG Abdomen: Soft, non-tender;  Extremities: No peripheral edema  Skin: Normal temperature, no rash,  Psych: Appropriate affect, pleasant  Neuro: MS: AA&Ox3, appropriately interactive, normal affect   Speech: fluent w/o paraphasic error  Memory: good recent and remote recall  CN: PERRL, EOMI no nystagmus, no ptosis, sensation intact to LT V1-V3 bilat, face symmetric, no  weakness, hearing grossly intact, palate elevates symmetrically, shoulder shrug 5/5 bilat,  tongue protrudes midline, no fasiculations noted.  Motor: normal bulk and tone Strength: 5/5  In all extremities  Reflexes: symmetrical, bilat downgoing toes  Sens: LT intact in all extremities  Brief Motor UPDRS  Speech: wnl  Facial Expression: mild masked facies, decreased blink rate  Tremor: Rest R 0 L 0 Action/postural R 1 L 0 Rigidity: Increased tone/cogwheel rigidity bilat  UE R>L Finger taps:   R:1 L:2  Open/close hands: R:1 L:1  Foot taps: R:1 L:1  Gait/FOG: Arises from chair without pushing off, upright, minimal shuffling, decreased arm swing bilat R>L, negative Romberg, normal Pull test    Assessment:  After physical and neurologic examination, review of laboratory studies, imaging, neurophysiology testing and pre-existing records, assessment will be reviewed on the problem list.  Plan:  Treatment plan and additional workup will be reviewed under Problem List.  1)Parkinsonism 2)RBD 3)Sialorrhea  63y/o gentleman presenting for follow up evaluation of tremor, generalized bradykinesia and change in personality. Physical exam is pertinent for masked facies, minimal intention and postural tremor, bradykinesia right-sided predominant, decreased armswing bilaterally right dominant. Based on clinical history and physical exam there is concern for an underlying parkinsonism. There does appear to be a mild progression since last visit. Extensively discussed the diagnosis and possible treatment options with patient and his wife. Expressed understanding. At this time we'll start him on Mirapex ER 0.375 mg once daily, can titrate up in the future as needed. They were extensively counseled on potential side effects, including impulse control disorder, related to dopamine agonist use. Will start melatonin 5-10 only for a REM behavior disorder. Patient counseled to increase exercise  and try to exercise minimum 3 times a week. Followup in 4 months.

## 2013-12-21 NOTE — Patient Instructions (Addendum)
Overall you are doing fairly well but I do want to suggest a few things today:   Remember to drink plenty of fluid, eat healthy meals and do not skip any meals. Try to eat protein with a every meal and eat a healthy snack such as fruit or nuts in between meals. Try to keep a regular sleep-wake schedule and try to exercise daily, particularly in the form of walking, 20-30 minutes a day, if you can.   As far as your medications are concerned, I would like to suggest the following: 1)We will start you on a dopamine agonist called Mirapex. Please start taking 0.375mg  daily. 2)You can try taking Melatonin 5 to 10mg  nightly for your sleep difficulties. This is available over the counter at your local pharmacy  Please exercise on a regular basis.  I would like to see you back in 3 to 4 months, sooner if we need to. Please call us with any interim questions, concerns, problems, updates or refill requests.   Please also call us for any test results so we can go over those with you on the phone.  My clinical assistant and will answer any of your questions and relay your messages to me and also relay most of my messages to you.   Our phone number is [phone]. We also have an after hours call service for urgent matters and there is a physician on-call for urgent questions. For any emergencies you know to call 911 or go to the nearest emergency room

## 2013-12-25 ENCOUNTER — Other Ambulatory Visit: Payer: Self-pay | Admitting: Neurology

## 2013-12-25 ENCOUNTER — Telehealth: Payer: Self-pay | Admitting: *Deleted

## 2013-12-25 MED ORDER — PRAMIPEXOLE DIHYDROCHLORIDE 0.125 MG PO TABS: ORAL_TABLET | ORAL | Status: DC

## 2013-12-25 NOTE — Telephone Encounter (Signed)
Called patient back. Switched to Mirapex IR 0.125mg  TID.

## 2014-01-13 ENCOUNTER — Other Ambulatory Visit: Payer: Self-pay | Admitting: Physician Assistant

## 2014-01-14 ENCOUNTER — Ambulatory Visit (INDEPENDENT_AMBULATORY_CARE_PROVIDER_SITE_OTHER): Payer: 59 | Admitting: Family Medicine

## 2014-01-14 VITALS — BP 110/70 | HR 114 | Temp 100.3°F | Resp 18 | Ht 69.5 in | Wt 221.0 lb

## 2014-01-14 DIAGNOSIS — R300 Dysuria: Secondary | ICD-10-CM

## 2014-01-14 DIAGNOSIS — G20 Parkinson's disease: Secondary | ICD-10-CM | POA: Insufficient documentation

## 2014-01-14 DIAGNOSIS — N12 Tubulo-interstitial nephritis, not specified as acute or chronic: Secondary | ICD-10-CM

## 2014-01-14 DIAGNOSIS — R509 Fever, unspecified: Secondary | ICD-10-CM

## 2014-01-14 DIAGNOSIS — R350 Frequency of micturition: Secondary | ICD-10-CM

## 2014-01-14 LAB — POCT CBC
Granulocyte percent: 86.3 %G — AB (ref 37–80)
HEMATOCRIT: 47.2 % (ref 43.5–53.7)
Hemoglobin: 14.8 g/dL (ref 14.1–18.1)
LYMPH, POC: 1.6 (ref 0.6–3.4)
MCH, POC: 30.7 pg (ref 27–31.2)
MCHC: 31.4 g/dL — AB (ref 31.8–35.4)
MCV: 98 fL — AB (ref 80–97)
MID (cbc): 0.8 (ref 0–0.9)
MPV: 9.8 fL (ref 0–99.8)
POC GRANULOCYTE: 14.9 — AB (ref 2–6.9)
POC LYMPH PERCENT: 9.2 %L — AB (ref 10–50)
POC MID %: 4.5 %M (ref 0–12)
Platelet Count, POC: 198 10*3/uL (ref 142–424)
RBC: 4.82 M/uL (ref 4.69–6.13)
RDW, POC: 15.6 %
WBC: 17.3 10*3/uL — AB (ref 4.6–10.2)

## 2014-01-14 LAB — POCT URINALYSIS DIPSTICK
Bilirubin, UA: NEGATIVE
Glucose, UA: NEGATIVE
Ketones, UA: NEGATIVE
Nitrite, UA: NEGATIVE
Protein, UA: 100
Spec Grav, UA: 1.025
Urobilinogen, UA: 1
pH, UA: 7

## 2014-01-14 LAB — POCT UA - MICROSCOPIC ONLY
Casts, Ur, LPF, POC: NEGATIVE
Crystals, Ur, HPF, POC: NEGATIVE
Mucus, UA: NEGATIVE
Yeast, UA: NEGATIVE

## 2014-01-14 MED ORDER — CEFTRIAXONE SODIUM 1 G IJ SOLR
1.0000 g | Freq: Once | INTRAMUSCULAR | Status: AC
  Administered 2014-01-14: 1 g via INTRAMUSCULAR

## 2014-01-14 MED ORDER — LEVOFLOXACIN 500 MG PO TABS: 500.0000 mg | ORAL_TABLET | Freq: Every day | ORAL | Status: DC

## 2014-01-14 NOTE — Patient Instructions (Signed)
Recheck tomorrow - to repeat CBC and check status

## 2014-01-14 NOTE — Progress Notes (Signed)
Subjective:    Patient ID: Danny [name], male    DOB: [birth year], 63 y.o.   MRN: [id number]  HPI Pt presents to clinic with dysuria and urinary frequency and slower than normal stream.  All his symptoms started this am.  He typically has no problems with his urine stream.  He did start Mirapex 2 weeks ago for his Parkinsons but has not been having problems with it.  He has started with chills and shaking.  He has no back pain.    Review of Systems  Constitutional: Positive for fever and chills.  Gastrointestinal: Positive for nausea (1 episode this am). Negative for vomiting and diarrhea.  Genitourinary: Positive for dysuria, urgency and frequency. Negative for hematuria, discharge, scrotal swelling, penile pain and testicular pain.       Objective:   Physical Exam  Vitals reviewed. Constitutional: He is oriented to person, place, and time. He appears well-developed and well-nourished.  HENT:  Head: Normocephalic and atraumatic.  Right Ear: External ear normal.  Left Ear: External ear normal.  Eyes: Conjunctivae are normal.  Neck: Normal range of motion.  Cardiovascular: Normal rate, regular rhythm and normal heart sounds.   No murmur heard. Pulmonary/Chest: Effort normal and breath sounds normal.  Abdominal: Soft. There is no tenderness. There is no CVA tenderness.  Neurological: He is alert and oriented to person, place, and time.  Skin: Skin is warm and dry.  Psychiatric: He has a normal mood and affect. His behavior is normal. Judgment and thought content normal.   Results for orders placed in visit on 01/14/14  POCT URINALYSIS DIPSTICK      Result Value Range   Color, UA yellow     Clarity, UA hazy     Glucose, UA neg     Bilirubin, UA neg     Ketones, UA neg     Spec Grav, UA 1.025     Blood, UA large     pH, UA 7.0     Protein, UA 100     Urobilinogen, UA 1.0     Nitrite, UA neg     Leukocytes, UA large (3+)    POCT UA - MICROSCOPIC ONLY      Result Value  Range   WBC, Ur, HPF, POC 25-30     RBC, urine, microscopic 5-8     Bacteria, U Microscopic 1+     Mucus, UA neg     Epithelial cells, urine per micros 0-2     Crystals, Ur, HPF, POC neg     Casts, Ur, LPF, POC neg     Yeast, UA neg    POCT CBC      Result Value Range   WBC 17.3 (*) 4.6 - 10.2 K/uL   Lymph, poc 1.6  0.6 - 3.4   POC LYMPH PERCENT 9.2 (*) 10 - 50 %L   MID (cbc) 0.8  0 - 0.9   POC MID % 4.5  0 - 12 %M   POC Granulocyte 14.9 (*) 2 - 6.9   Granulocyte percent 86.3 (*) 37 - 80 %G   RBC 4.82  4.69 - 6.13 M/uL   Hemoglobin 14.8  14.1 - 18.1 g/dL   HCT, POC 47.2  43.5 - 53.7 %   MCV 98.0 (*) 80 - 97 fL   MCH, POC 30.7  27 - 31.2 pg   MCHC 31.4 (*) 31.8 - 35.4 g/dL   RDW, POC 15.6     Platelet Count, POC  198  142 - 424 K/uL   MPV 9.8  0 - 99.8 fL       Assessment & Plan:  Frequent urination - Plan: POCT urinalysis dipstick, POCT UA - Microscopic Only  Dysuria - Plan: POCT urinalysis dipstick, POCT UA - Microscopic Only  Fever, unspecified - Plan: POCT CBC  Pyelonephritis - Plan: Urine culture, levofloxacin (LEVAQUIN) 500 MG tablet, cefTRIAXone (ROCEPHIN) injection 1 g  Pt has pyelonephritis - we will start Levaquin tonight and he will take another in the am.  We will give him Rocephin tonight.  He will recheck in the am to repeat CBC and recheck the patient.  He will push fluids.  Windell Hummingbird PA-C 01/14/2014 3:24 PM

## 2014-01-15 ENCOUNTER — Ambulatory Visit (INDEPENDENT_AMBULATORY_CARE_PROVIDER_SITE_OTHER): Payer: 59 | Admitting: Family Medicine

## 2014-01-15 VITALS — BP 120/68 | HR 95 | Temp 99.2°F | Resp 18 | Ht 69.5 in | Wt 221.0 lb

## 2014-01-15 DIAGNOSIS — R300 Dysuria: Secondary | ICD-10-CM

## 2014-01-15 DIAGNOSIS — R350 Frequency of micturition: Secondary | ICD-10-CM

## 2014-01-15 DIAGNOSIS — N12 Tubulo-interstitial nephritis, not specified as acute or chronic: Secondary | ICD-10-CM

## 2014-01-15 LAB — POCT CBC
Granulocyte percent: 87.1 %G — AB (ref 37–80)
HCT, POC: 45.6 % (ref 43.5–53.7)
Hemoglobin: 14.2 g/dL (ref 14.1–18.1)
Lymph, poc: 1.5 (ref 0.6–3.4)
MCH, POC: 30.7 pg (ref 27–31.2)
MCHC: 31.1 g/dL — AB (ref 31.8–35.4)
MCV: 98.4 fL — AB (ref 80–97)
MID (cbc): 0.9 (ref 0–0.9)
MPV: 9.8 fL (ref 0–99.8)
POC Granulocyte: 16.5 — AB (ref 2–6.9)
POC LYMPH PERCENT: 8.1 %L — AB (ref 10–50)
POC MID %: 4.8 % (ref 0–12)
Platelet Count, POC: 163 10*3/uL (ref 142–424)
RBC: 4.63 M/uL — AB (ref 4.69–6.13)
RDW, POC: 15.2 %
WBC: 19 10*3/uL — AB (ref 4.6–10.2)

## 2014-01-15 LAB — POCT URINALYSIS DIPSTICK
Bilirubin, UA: NEGATIVE
Blood, UA: NEGATIVE
Glucose, UA: 500
Ketones, UA: 15
Leukocytes, UA: NEGATIVE
Nitrite, UA: NEGATIVE
Protein, UA: 30
Spec Grav, UA: >=1.03
Urobilinogen, UA: 1
pH, UA: 5

## 2014-01-15 LAB — POCT UA - MICROSCOPIC ONLY
Casts, Ur, LPF, POC: NEGATIVE
Crystals, Ur, HPF, POC: NEGATIVE
Epithelial cells, urine per micros: NEGATIVE
Mucus, UA: POSITIVE
Yeast, UA: NEGATIVE

## 2014-01-15 MED ORDER — CEFTRIAXONE SODIUM 1 G IJ SOLR
1.0000 g | Freq: Once | INTRAMUSCULAR | Status: AC
  Administered 2014-01-15: 1 g via INTRAMUSCULAR

## 2014-01-15 NOTE — Progress Notes (Signed)
Chief Complaint:  Chief Complaint  Patient presents with  . Follow-up    kidney infection     HPI:  Danny [name] is a 63 y.o. male who is here for  patient was seen on 01/14/14 for urinary tract infection. He is here today for recheck on a CBC and to follow up on symptoms. He states that he is feeling better today. He is not having any chills, vomiting, or diarrhea. He does have some lower back pain, pain level is a 4. Today he has no urinary burning. Patient started taking Levaquin 500 mg last night. He is feeling better. No n/v/vomiting.   Past Medical History  Diagnosis Date  . Type II or unspecified type diabetes mellitus without mention of complication, not stated as uncontrolled   . Mixed hyperlipidemia   . OA (osteoarthritis) of knee     right  . Essential hypertension, benign   . Obesity, unspecified    Past Surgical History  Procedure Laterality Date  . Knee arthroscopy      right  . Ganglion cyst excision  age 48 years    right  . Wrist surgery  04/2012   History   Social History  . Marital Status: Married    Spouse Name: [name]    Number of Children: 3  . Years of Education: 16   Occupational History  . Engineer    Social History Main Topics  . Smoking status: Former Smoker -- 1.50 packs/day for 15 years    Types: Cigarettes    Quit date: 12/17/1982  . Smokeless tobacco: Never Used     Comment: over 40 yrs  . Alcohol Use: Yes     Comment: very rare  . Drug Use: No  . Sexual Activity: Yes    Partners: Female   Other Topics Concern  . None   Social History Narrative   Research officer, trade union, motorcycle rider.  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  He has 3 adult children.  His wife had 2 children, one of whom is deceased.   Patient is working full-time.   Patient has a Bachelor's degree.   Patient is right-handed.   Patient drinks 6 cups of coffee daily.   Family History  Problem Relation Age of Onset  . Diabetes Mother   . Diabetes Sister   .  Diabetes Daughter   . Cancer Father   . Heart disease Father    Allergies  Allergen Reactions  . Codeine Nausea And Vomiting   Prior to Admission medications   Medication Sig Start Date End Date Taking? Authorizing Provider  aspirin 81 MG tablet Take 81 mg by mouth daily.     Yes Historical Provider, MD  enalapril (VASOTEC) 2.5 MG tablet Take 1 tablet by mouth  daily 11/09/13  Yes Eleanore E Egan, PA-C  Ginger, Zingiber officinalis, 550 MG CAPS Take 550 mg by mouth daily.     Yes Historical Provider, MD  glipiZIDE (GLIPIZIDE XL) 2.5 MG 24 hr tablet Take 1 tablet (2.5 mg total) by mouth daily. 04/07/13  Yes Chelle S Jeffery, PA-C  Glucosamine-Chondroit-Vit C-Mn (GLUCOSAMINE CHONDR 1500 COMPLX PO) Take 1,500 mg by mouth daily.     Yes Historical Provider, MD  glucose blood test strip Use as instructed 09/15/13  Yes Ryan M Dunn, PA-C  JANUMET 50-1000 MG per tablet Take 1 tablet by mouth  twice a day with meals 01/13/14  Yes Chelle S Jeffery, PA-C  levofloxacin (LEVAQUIN) 500 MG  tablet Take 1 tablet (500 mg total) by mouth daily. 01/14/14  Yes Mancel Bale, PA-C  pramipexole (MIRAPEX) 0.125 MG tablet One tablet by mouth 3 times a day 12/25/13  Yes Hulen Luster, DO  simvastatin (ZOCOR) 40 MG tablet Take 1/2 tablet daily 04/07/13  Yes Chelle S Jeffery, PA-C  vitamin C (ASCORBIC ACID) 500 MG tablet Take 500 mg by mouth daily.     Yes Historical Provider, MD  zoster vaccine live, PF, (ZOSTAVAX) 57846 UNT/0.65ML injection Inject 19,400 Units into the skin once. 04/07/13   Chelle S Jeffery, PA-C     ROS: The patient denies fevers, chills, night sweats, unintentional weight loss, chest pain, palpitations, wheezing, dyspnea on exertion, nausea, vomiting, abdominal pain, dysuria, hematuria, melena, numbness, weakness, or tingling.   All other systems have been reviewed and were otherwise negative with the exception of those mentioned in the HPI and as above.    PHYSICAL EXAM: Filed Vitals:    01/15/14 0819  BP: 120/68  Pulse: 95  Temp: 99.2 F (37.3 C)  Resp: 18   Filed Vitals:   01/15/14 0819  Height: 5' 9.5" (1.765 m)  Weight: 221 lb (100.245 kg)   Body mass index is 32.18 kg/(m^2).  General: Alert, no acute distress HEENT:  Normocephalic, atraumatic, oropharynx patent. EOMI, PERRLA Cardiovascular:  Regular rate and rhythm, no rubs murmurs or gallops.  No Carotid bruits, radial pulse intact. No pedal edema.  Respiratory: Clear to auscultation bilaterally.  No wheezes, rales, or rhonchi.  No cyanosis, no use of accessory musculature GI: No organomegaly, abdomen is soft and non-tender, positive bowel sounds.  No masses. Skin: No rashes. Neurologic: Facial musculature symmetric. + tremors form parkinsons Psychiatric: Patient is appropriate throughout our interaction. Lymphatic: No cervical lymphadenopathy Musculoskeletal: Gait intact. No CVA tenerness   LABS: Results for orders placed in visit on 01/15/14  POCT CBC      Result Value Range   WBC 19.0 (*) 4.6 - 10.2 K/uL   Lymph, poc 1.5  0.6 - 3.4   POC LYMPH PERCENT 8.1 (*) 10 - 50 %L   MID (cbc) 0.9  0 - 0.9   POC MID % 4.8  0 - 12 %M   POC Granulocyte 16.5 (*) 2 - 6.9   Granulocyte percent 87.1 (*) 37 - 80 %G   RBC 4.63 (*) 4.69 - 6.13 M/uL   Hemoglobin 14.2  14.1 - 18.1 g/dL   HCT, POC 45.6  43.5 - 53.7 %   MCV 98.4 (*) 80 - 97 fL   MCH, POC 30.7  27 - 31.2 pg   MCHC 31.1 (*) 31.8 - 35.4 g/dL   RDW, POC 15.2     Platelet Count, POC 163  142 - 424 K/uL   MPV 9.8  0 - 99.8 fL  POCT URINALYSIS DIPSTICK      Result Value Range   Color, UA amber     Clarity, UA clear     Glucose, UA 500     Bilirubin, UA neg     Ketones, UA 15     Spec Grav, UA >=1.030     Blood, UA neg     pH, UA 5.0     Protein, UA 30     Urobilinogen, UA 1.0     Nitrite, UA neg     Leukocytes, UA Negative    POCT UA - MICROSCOPIC ONLY      Result Value Range   WBC, Ur, HPF, POC 18-30  RBC, urine, microscopic 0-1      Bacteria, U Microscopic trace     Mucus, UA positive     Epithelial cells, urine per micros neg     Crystals, Ur, HPF, POC neg     Casts, Ur, LPF, POC neg     Yeast, UA neg       EKG/XRAY:   Primary read interpreted by Dr. Marin Comment at [facility].   ASSESSMENT/PLAN: Encounter Diagnoses  Name Primary?  . Urinary frequency Yes  . Dysuria   . Pyelonephritis, unspecified    Symptomaitcally feeling much better Declined IVF, Declined going to Er, He does have Diabetes not stated as uncontrolled He will return in the Am for repeat labs: CBC, UA Encourage to push fluids Will give another Rocephin 1 gram x 1 today  Go to ER for Worsening sxs. Patient understands he is at high risk due to DM, but he is symptomatically better  Gross sideeffects, risk and benefits, and alternatives of medications d/w patient. Patient is aware that all medications have potential sideeffects and we are unable to predict every sideeffect or drug-drug interaction that may occur.  LE, [place], DO 01/15/2014 9:47 AM

## 2014-01-16 ENCOUNTER — Ambulatory Visit (INDEPENDENT_AMBULATORY_CARE_PROVIDER_SITE_OTHER): Payer: 59 | Admitting: Physician Assistant

## 2014-01-16 VITALS — BP 90/58 | HR 91 | Temp 98.0°F | Resp 16 | Ht 69.0 in | Wt 221.0 lb

## 2014-01-16 DIAGNOSIS — R5381 Other malaise: Secondary | ICD-10-CM

## 2014-01-16 DIAGNOSIS — R5383 Other fatigue: Secondary | ICD-10-CM

## 2014-01-16 DIAGNOSIS — N12 Tubulo-interstitial nephritis, not specified as acute or chronic: Secondary | ICD-10-CM

## 2014-01-16 DIAGNOSIS — R350 Frequency of micturition: Secondary | ICD-10-CM

## 2014-01-16 DIAGNOSIS — E119 Type 2 diabetes mellitus without complications: Secondary | ICD-10-CM

## 2014-01-16 DIAGNOSIS — N390 Urinary tract infection, site not specified: Secondary | ICD-10-CM

## 2014-01-16 DIAGNOSIS — R300 Dysuria: Secondary | ICD-10-CM

## 2014-01-16 LAB — POCT CBC
Granulocyte percent: 84.1 %G — AB (ref 37–80)
HEMATOCRIT: 42.8 % — AB (ref 43.5–53.7)
Hemoglobin: 13.4 g/dL — AB (ref 14.1–18.1)
LYMPH, POC: 1.6 (ref 0.6–3.4)
MCH, POC: 30.5 pg (ref 27–31.2)
MCHC: 31.3 g/dL — AB (ref 31.8–35.4)
MCV: 97.4 fL — AB (ref 80–97)
MID (CBC): 0.9 (ref 0–0.9)
MPV: 9.7 fL (ref 0–99.8)
PLATELET COUNT, POC: 154 10*3/uL (ref 142–424)
POC GRANULOCYTE: 13.4 — AB (ref 2–6.9)
POC LYMPH %: 10.3 % (ref 10–50)
POC MID %: 5.6 %M (ref 0–12)
RBC: 4.39 M/uL — AB (ref 4.69–6.13)
RDW, POC: 15.2 %
WBC: 15.9 10*3/uL — AB (ref 4.6–10.2)

## 2014-01-16 LAB — POCT UA - MICROSCOPIC ONLY
CASTS, UR, LPF, POC: NEGATIVE
Crystals, Ur, HPF, POC: NEGATIVE
Yeast, UA: NEGATIVE

## 2014-01-16 LAB — POCT URINALYSIS DIPSTICK
Glucose, UA: NEGATIVE
Ketones, UA: 15
Leukocytes, UA: NEGATIVE
Nitrite, UA: NEGATIVE
Protein, UA: 30
RBC UA: NEGATIVE
Spec Grav, UA: >=1.03
UROBILINOGEN UA: 1
pH, UA: 5

## 2014-01-16 LAB — GLUCOSE, POCT (MANUAL RESULT ENTRY): POC Glucose: 135 mg/dl — AB (ref 70–99)

## 2014-01-16 MED ORDER — CEFTRIAXONE SODIUM 1 G IJ SOLR
1.0000 g | Freq: Once | INTRAMUSCULAR | Status: AC
  Administered 2014-01-16: 1 g via INTRAMUSCULAR

## 2014-01-16 NOTE — Progress Notes (Signed)
Subjective:    Patient ID: Danny [name], male    DOB: January 07, 1951, 63 y.o.   MRN: [id number]  HPI Primary Physician: JEFFERY,CHELLE, PA-C  Chief Complaint: Follow up pyelonephritis   HPI: 63 y.o. male with history below presents for follow up of pyelonephritis and leukocytosis. Patient initially presented to clinic on 01/14/14 with a 1 day history of dysuria, urinary frequency, decreased urinary stream, fever, chills, and one episode of nausea. His WBC count was found to be 17.3 with 86.3 segs. His UA revealed large blood, 3+ LE, and negative nitrite. UA microscopy was significant for 25-30 WBC/HPF, 5-8 RBC/HPF, and 1+ bacteria/HPF. He was given Rocephin 1 gram in office and started on Levaquin 500 mg daily.   He followed up as planned on 01/15/14 stating that he was feeling better. He was having some lower back pain, 4/10. No dysuria. No nausea or vomiting. His WBC count was 19.0 with 87.1 segs. His UA was significant for 500 glucose, negative blood, and negative LE. UA microscopy revealed 18-30 WBC/HPF, 0-1 RBC/HPF, and trace bacteria/HPF. He again received Rocephin 1 gram IM.  Today he comes in for follow stating that he continues to improve, but feels fatigued. He remains afebrile and without chills. No nausea or vomiting. No dysuria. He continues to have urinary frequency and urinary urgency, but he is drinking a lot of water. The frequency has improved from every 5 minutes to about every 30-40 minutes. He does not have any pain along his bottom when sitting. Urine culture is growing > 100,000 CFU of E coli. Sensitivity is pending.       Past Medical History  Diagnosis Date  . Type II or unspecified type diabetes mellitus without mention of complication, not stated as uncontrolled   . Mixed hyperlipidemia   . OA (osteoarthritis) of knee     right  . Essential hypertension, benign   . Obesity, unspecified      Home Meds: Prior to Admission medications   Medication Sig Start Date  End Date Taking? Authorizing Provider  aspirin 81 MG tablet Take 81 mg by mouth daily.     Yes Historical Provider, MD  enalapril (VASOTEC) 2.5 MG tablet Take 1 tablet by mouth  daily 11/09/13  Yes Eleanore E Egan, PA-C  Ginger, Zingiber officinalis, 550 MG CAPS Take 550 mg by mouth daily.     Yes Historical Provider, MD  glipiZIDE (GLIPIZIDE XL) 2.5 MG 24 hr tablet Take 1 tablet (2.5 mg total) by mouth daily. 04/07/13  Yes Chelle S Jeffery, PA-C  Glucosamine-Chondroit-Vit C-Mn (GLUCOSAMINE CHONDR 1500 COMPLX PO) Take 1,500 mg by mouth daily.     Yes Historical Provider, MD  glucose blood test strip Use as instructed 09/15/13  Yes Ryan M Dunn, PA-C  JANUMET 50-1000 MG per tablet Take 1 tablet by mouth  twice a day with meals 01/13/14  Yes Chelle S Jeffery, PA-C  levofloxacin (LEVAQUIN) 500 MG tablet Take 1 tablet (500 mg total) by mouth daily. 01/14/14  Yes Mancel Bale, PA-C  pramipexole (MIRAPEX) 0.125 MG tablet One tablet by mouth 3 times a day 12/25/13  Yes Hulen Luster, DO  simvastatin (ZOCOR) 40 MG tablet Take 1/2 tablet daily 04/07/13  Yes Chelle S Jeffery, PA-C  vitamin C (ASCORBIC ACID) 500 MG tablet Take 500 mg by mouth daily.     Yes Historical Provider, MD  zoster vaccine live, PF, (ZOSTAVAX) 78242 UNT/0.65ML injection Inject 19,400 Units into the skin once. 04/07/13   Chelle  Janalee Dane, PA-C    Allergies:  Allergies  Allergen Reactions  . Codeine Nausea And Vomiting    History   Social History  . Marital Status: Married    Spouse Name: [name]    Number of Children: 3  . Years of Education: 16   Occupational History  . Engineer    Social History Main Topics  . Smoking status: Former Smoker -- 1.50 packs/day for 15 years    Types: Cigarettes    Quit date: 12/17/1982  . Smokeless tobacco: Never Used     Comment: over 40 yrs  . Alcohol Use: Yes     Comment: very rare  . Drug Use: No  . Sexual Activity: Yes    Partners: Female   Other Topics Concern  . Not on file    Social History Narrative   [name], motorcycle rider.  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  He has 3 adult children.  His wife had 2 children, one of whom is deceased.   Patient is working full-time.   Patient has a Bachelor's degree.   Patient is right-handed.   Patient drinks 6 cups of coffee daily.      Review of Systems  Constitutional: Positive for fatigue. Negative for fever, chills, diaphoresis and appetite change.  Cardiovascular: Negative for chest pain.  Endocrine: Positive for polydipsia and polyuria.  Genitourinary: Positive for urgency and frequency. Negative for dysuria, hematuria, flank pain, decreased urine volume, discharge, penile swelling, scrotal swelling, difficulty urinating, penile pain and testicular pain.  Musculoskeletal: Negative for myalgias.       Objective:   Physical Exam  Physical Exam: Blood pressure 90/58, pulse 91, temperature 98 F (36.7 C), temperature source Oral, resp. rate 16, height 5\' 9"  (1.753 m), weight 221 lb (100.245 kg), SpO2 98.00%., Body mass index is 32.62 kg/(m^2). General: Well developed, well nourished, in no acute distress. Head: Normocephalic, atraumatic, eyes without discharge, sclera non-icteric, nares are without discharge. Bilateral auditory canals clear, TM's are without perforation, pearly grey and translucent with reflective cone of light bilaterally. Oral cavity moist, posterior pharynx without exudate, erythema, peritonsillar abscess, or post nasal drip. Uvula midline.   Neck: Supple. No thyromegaly. Full ROM. No lymphadenopathy. Lungs: Clear bilaterally to auscultation without wheezes, rales, or rhonchi. Breathing is unlabored. Heart: RRR with S1 S2. No murmurs, rubs, or gallops appreciated. Abdomen: Soft, non-tender, non-distended with normoactive bowel sounds. No hepatomegaly. No rebound/guarding. No obvious abdominal masses. No CVA TTP bilaterally. Msk:  Strength and tone normal for age. Extremities/Skin: Warm and  dry. No clubbing or cyanosis. No edema. No rashes or suspicious lesions. Neuro: Alert and oriented X 3. Moves all extremities spontaneously. Gait is normal. CNII-XII grossly in tact. Psych:  Responds to questions appropriately with a normal affect.    Labs: Results for orders placed in visit on 01/16/14  POCT URINALYSIS DIPSTICK      Result Value Range   Color, UA amber     Clarity, UA hazy     Glucose, UA neg     Bilirubin, UA small     Ketones, UA 15     Spec Grav, UA >=1.030     Blood, UA neg     pH, UA 5.0     Protein, UA 30     Urobilinogen, UA 1.0     Nitrite, UA neg     Leukocytes, UA Negative    POCT UA - MICROSCOPIC ONLY      Result Value Range  WBC, Ur, HPF, POC 15-20     RBC, urine, microscopic 0-2     Bacteria, U Microscopic trace     Mucus, UA 1+     Epithelial cells, urine per micros 1-2     Crystals, Ur, HPF, POC neg     Casts, Ur, LPF, POC neg     Yeast, UA neg    POCT CBC      Result Value Range   WBC 15.9 (*) 4.6 - 10.2 K/uL   Lymph, poc 1.6  0.6 - 3.4   POC LYMPH PERCENT 10.3  10 - 50 %L   MID (cbc) 0.9  0 - 0.9   POC MID % 5.6  0 - 12 %M   POC Granulocyte 13.4 (*) 2 - 6.9   Granulocyte percent 84.1 (*) 37 - 80 %G   RBC 4.39 (*) 4.69 - 6.13 M/uL   Hemoglobin 13.4 (*) 14.1 - 18.1 g/dL   HCT, POC 42.8 (*) 43.5 - 53.7 %   MCV 97.4 (*) 80 - 97 fL   MCH, POC 30.5  27 - 31.2 pg   MCHC 31.3 (*) 31.8 - 35.4 g/dL   RDW, POC 15.2     Platelet Count, POC 154  142 - 424 K/uL   MPV 9.7  0 - 99.8 fL  GLUCOSE, POCT (MANUAL RESULT ENTRY)      Result Value Range   POC Glucose 135 (*) 70 - 99 mg/dl   Urine culture with >100,000 CFU of E coli Sensitivity pending  PSA pending      Assessment & Plan:  63 year old male with pyelonephritis and diabetes mellitus  -Rocephin 1 gram IM -Continue Levaquin 500 mg daily -Push fluids -Add PSA -Recheck 24 hours -Plan to given 2 more doses of Rocephin 1 gram IM for a total of 5 doses IM -He has planned follow up  with Ms. Jacqulynn Cadet, PA-C on 01/26/14   Christell Faith, MHS, PA-C [facility] [place], [place] [id number] [facility] 01/16/2014 9:27 AM

## 2014-01-17 ENCOUNTER — Ambulatory Visit (INDEPENDENT_AMBULATORY_CARE_PROVIDER_SITE_OTHER): Payer: 59 | Admitting: Physician Assistant

## 2014-01-17 VITALS — BP 110/70 | HR 98 | Temp 98.0°F | Resp 16 | Ht 69.0 in | Wt 221.0 lb

## 2014-01-17 DIAGNOSIS — N390 Urinary tract infection, site not specified: Secondary | ICD-10-CM

## 2014-01-17 DIAGNOSIS — R5381 Other malaise: Secondary | ICD-10-CM

## 2014-01-17 DIAGNOSIS — R350 Frequency of micturition: Secondary | ICD-10-CM

## 2014-01-17 DIAGNOSIS — R5383 Other fatigue: Secondary | ICD-10-CM

## 2014-01-17 DIAGNOSIS — N12 Tubulo-interstitial nephritis, not specified as acute or chronic: Secondary | ICD-10-CM

## 2014-01-17 LAB — POCT CBC
GRANULOCYTE PERCENT: 77.8 % (ref 37–80)
HCT, POC: 43.3 % — AB (ref 43.5–53.7)
HEMOGLOBIN: 14.1 g/dL (ref 14.1–18.1)
Lymph, poc: 1.7 (ref 0.6–3.4)
MCH, POC: 31.3 pg — AB (ref 27–31.2)
MCHC: 32.6 g/dL (ref 31.8–35.4)
MCV: 96 fL (ref 80–97)
MID (cbc): 0.8 (ref 0–0.9)
MPV: 10.7 fL (ref 0–99.8)
POC Granulocyte: 8.6 — AB (ref 2–6.9)
POC LYMPH PERCENT: 15.1 %L (ref 10–50)
POC MID %: 7.1 % (ref 0–12)
Platelet Count, POC: 193 10*3/uL (ref 142–424)
RBC: 4.51 M/uL — AB (ref 4.69–6.13)
RDW, POC: 15.1 %
WBC: 11 10*3/uL — AB (ref 4.6–10.2)

## 2014-01-17 LAB — POCT URINALYSIS DIPSTICK
Bilirubin, UA: NEGATIVE
Glucose, UA: NEGATIVE
Ketones, UA: NEGATIVE
Nitrite, UA: NEGATIVE
PH UA: 5.5
PROTEIN UA: 30
RBC UA: NEGATIVE
Spec Grav, UA: >=1.03
UROBILINOGEN UA: 0.2

## 2014-01-17 LAB — POCT UA - MICROSCOPIC ONLY
CRYSTALS, UR, HPF, POC: NEGATIVE
Yeast, UA: NEGATIVE

## 2014-01-17 LAB — URINE CULTURE: Colony Count: >=100000

## 2014-01-17 MED ORDER — CEFTRIAXONE SODIUM 1 G IJ SOLR
1.0000 g | Freq: Once | INTRAMUSCULAR | Status: AC
  Administered 2014-01-17: 1 g via INTRAMUSCULAR

## 2014-01-17 NOTE — Progress Notes (Signed)
Subjective:    Patient ID: Danny [name], male    DOB: 02-28-1951, 63 y.o.   MRN: [id number]  HPI Primary Physician: JEFFERY,CHELLE, PA-C  Chief Complaint: Follow up follow up pyelonephritis and leukocytosis   HPI: 63 y.o. male with history below presents for follow up of pyelonephritis and leukocytosis. See my previous note for detailed outline of his course. He is here today for a recheck of his UA, and his symptoms.  Today he states that he is feeling better. Still a little fatigue. Afebrile. No chills. No nausea or vomiting. No dysuria. Urinary frequency and urgency continue to improve. He is now voiding about every hour. This is improved from an original every 5 minutes, to every 30-40 minutes, now to every hour. No abdominal pain, flank pain, or low back pain. He continues to take Levaquin 500 mg daily without issues. He is pushing water daily.    Past Medical History  Diagnosis Date  . Type II or unspecified type diabetes mellitus without mention of complication, not stated as uncontrolled   . Mixed hyperlipidemia   . OA (osteoarthritis) of knee     right  . Essential hypertension, benign   . Obesity, unspecified      Home Meds: Prior to Admission medications   Medication Sig Start Date End Date Taking? Authorizing Provider  aspirin 81 MG tablet Take 81 mg by mouth daily.     Yes Historical Provider, MD  enalapril (VASOTEC) 2.5 MG tablet Take 1 tablet by mouth  daily 11/09/13  Yes Eleanore E Egan, PA-C  Ginger, Zingiber officinalis, 550 MG CAPS Take 550 mg by mouth daily.     Yes Historical Provider, MD  glipiZIDE (GLIPIZIDE XL) 2.5 MG 24 hr tablet Take 1 tablet (2.5 mg total) by mouth daily. 04/07/13  Yes Chelle S Jeffery, PA-C  Glucosamine-Chondroit-Vit C-Mn (GLUCOSAMINE CHONDR 1500 COMPLX PO) Take 1,500 mg by mouth daily.     Yes Historical Provider, MD  glucose blood test strip Use as instructed 09/15/13  Yes Ryan M Dunn, PA-C  JANUMET 50-1000 MG per tablet Take 1  tablet by mouth  twice a day with meals 01/13/14  Yes Chelle S Jeffery, PA-C  levofloxacin (LEVAQUIN) 500 MG tablet Take 1 tablet (500 mg total) by mouth daily. 01/14/14  Yes Mancel Bale, PA-C  pramipexole (MIRAPEX) 0.125 MG tablet One tablet by mouth 3 times a day 12/25/13  Yes Hulen Luster, DO  simvastatin (ZOCOR) 40 MG tablet Take 1/2 tablet daily 04/07/13  Yes Chelle S Jeffery, PA-C  vitamin C (ASCORBIC ACID) 500 MG tablet Take 500 mg by mouth daily.     Yes Historical Provider, MD  zoster vaccine live, PF, (ZOSTAVAX) 29528 UNT/0.65ML injection Inject 19,400 Units into the skin once. 04/07/13  Yes Chelle Janalee Dane, PA-C    Allergies:  Allergies  Allergen Reactions  . Codeine Nausea And Vomiting    History   Social History  . Marital Status: Married    Spouse Name: [name]    Number of Children: 3  . Years of Education: 16   Occupational History  . Engineer    Social History Main Topics  . Smoking status: Former Smoker -- 1.50 packs/day for 15 years    Types: Cigarettes    Quit date: 12/17/1982  . Smokeless tobacco: Never Used     Comment: over 40 yrs  . Alcohol Use: Yes     Comment: very rare  . Drug Use: No  . Sexual  Activity: Yes    Partners: Female   Other Topics Concern  . Not on file   Social History Narrative   [name], motorcycle rider.  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  He has 3 adult children.  His wife had 2 children, one of whom is deceased.   Patient is working full-time.   Patient has a Bachelor's degree.   Patient is right-handed.   Patient drinks 6 cups of coffee daily.     Review of Systems     Objective:   Physical Exam  Physical Exam: Blood pressure 110/70, pulse 98, temperature 98 F (36.7 C), temperature source Oral, resp. rate 16, height 5\' 9"  (1.753 m), weight 221 lb (100.245 kg), SpO2 98.00%., Body mass index is 32.62 kg/(m^2). General: Well developed, well nourished, in no acute distress. Head: Normocephalic, atraumatic, eyes  without discharge, sclera non-icteric, nares are without discharge. Bilateral auditory canals with cerumen, TM's are without perforation, pearly grey and translucent with reflective cone of light bilaterally. Oral cavity moist, posterior pharynx without exudate, erythema, peritonsillar abscess, or post nasal drip. Uvula midline.   Neck: Supple. No thyromegaly. Full ROM. No lymphadenopathy. Lungs: Clear bilaterally to auscultation without wheezes, rales, or rhonchi. Breathing is unlabored. Heart: RRR with S1 S2. No murmurs, rubs, or gallops appreciated. Abdomen: Soft, non-tender, non-distended with normoactive bowel sounds. No hepatomegaly. No rebound/guarding. No obvious abdominal masses. No CVA TTP bilaterally.  Msk:  Strength and tone normal for age. Extremities/Skin: Warm and dry. No clubbing or cyanosis. No edema. No rashes or suspicious lesions. Neuro: Alert and oriented X 3. Moves all extremities spontaneously. Gait is normal. CNII-XII grossly in tact. Psych:  Responds to questions appropriately with a normal affect.    Labs: Results for orders placed in visit on 01/17/14  POCT UA - MICROSCOPIC ONLY      Result Value Range   WBC, Ur, HPF, POC 35-40     RBC, urine, microscopic 0-2     Bacteria, U Microscopic small     Mucus, UA small     Epithelial cells, urine per micros 1-3     Crystals, Ur, HPF, POC neg     Casts, Ur, LPF, POC hyaline     Yeast, UA neg    POCT URINALYSIS DIPSTICK      Result Value Range   Color, UA amber     Clarity, UA cloudy     Glucose, UA neg     Bilirubin, UA neg     Ketones, UA neg     Spec Grav, UA >=1.030     Blood, UA neg     pH, UA 5.5     Protein, UA 30     Urobilinogen, UA 0.2     Nitrite, UA neg     Leukocytes, UA Trace    POCT CBC      Result Value Range   WBC 11.0 (*) 4.6 - 10.2 K/uL   Lymph, poc 1.7  0.6 - 3.4   POC LYMPH PERCENT 15.1  10 - 50 %L   MID (cbc) 0.8  0 - 0.9   POC MID % 7.1  0 - 12 %M   POC Granulocyte 8.6 (*) 2 - 6.9     Granulocyte percent 77.8  37 - 80 %G   RBC 4.51 (*) 4.69 - 6.13 M/uL   Hemoglobin 14.1  14.1 - 18.1 g/dL   HCT, POC 43.3 (*) 43.5 - 53.7 %   MCV 96.0  80 -  97 fL   MCH, POC 31.3 (*) 27 - 31.2 pg   MCHC 32.6  31.8 - 35.4 g/dL   RDW, POC 15.1     Platelet Count, POC 193  142 - 424 K/uL   MPV 10.7  0 - 99.8 fL    PSA still pending.   Sensitivity of the E coli still pending at the time of the note.     Assessment & Plan:  63 year old male with pyelonephritis and  -Rocephin 1 gram IM -Continue Levaquin 500 mg daily -Await PSA -Push fluids -Recheck 24 hours -Plan for 5th dose of Rocephin 1 gram IM   Christell Faith, MHS, PA-C [facility] [place], [place] [address] [place] 01/17/2014 8:02 AM

## 2014-01-18 ENCOUNTER — Telehealth: Payer: Self-pay

## 2014-01-18 ENCOUNTER — Ambulatory Visit (INDEPENDENT_AMBULATORY_CARE_PROVIDER_SITE_OTHER): Payer: 59 | Admitting: Physician Assistant

## 2014-01-18 VITALS — BP 120/80 | HR 96 | Temp 97.7°F | Resp 16 | Ht 69.0 in | Wt 218.0 lb

## 2014-01-18 DIAGNOSIS — N410 Acute prostatitis: Secondary | ICD-10-CM

## 2014-01-18 DIAGNOSIS — N12 Tubulo-interstitial nephritis, not specified as acute or chronic: Secondary | ICD-10-CM

## 2014-01-18 LAB — POCT URINALYSIS DIPSTICK
Bilirubin, UA: NEGATIVE
Blood, UA: NEGATIVE
Glucose, UA: NEGATIVE
Ketones, UA: NEGATIVE
Nitrite, UA: NEGATIVE
Spec Grav, UA: >=1.03
Urobilinogen, UA: 0.2
pH, UA: 5.5

## 2014-01-18 LAB — POCT CBC
Granulocyte percent: 70.2 %G (ref 37–80)
HCT, POC: 47 % (ref 43.5–53.7)
Hemoglobin: 14.8 g/dL (ref 14.1–18.1)
Lymph, poc: 1.9 (ref 0.6–3.4)
MCH, POC: 30.5 pg (ref 27–31.2)
MCHC: 31.5 g/dL — AB (ref 31.8–35.4)
MCV: 96.9 fL (ref 80–97)
MID (cbc): 0.6 (ref 0–0.9)
MPV: 9.4 fL (ref 0–99.8)
POC Granulocyte: 6 (ref 2–6.9)
POC LYMPH PERCENT: 22.3 %L (ref 10–50)
POC MID %: 7.5 %M (ref 0–12)
Platelet Count, POC: 227 10*3/uL (ref 142–424)
RBC: 4.85 M/uL (ref 4.69–6.13)
RDW, POC: 15.3 %
WBC: 8.6 10*3/uL (ref 4.6–10.2)

## 2014-01-18 LAB — POCT UA - MICROSCOPIC ONLY
Bacteria, U Microscopic: NEGATIVE
Casts, Ur, LPF, POC: NEGATIVE
Crystals, Ur, HPF, POC: NEGATIVE
Epithelial cells, urine per micros: NEGATIVE
Mucus, UA: POSITIVE
RBC, urine, microscopic: NEGATIVE
Yeast, UA: NEGATIVE

## 2014-01-18 LAB — PSA: PSA: 24.73 ng/mL — ABNORMAL HIGH (ref <=4.00)

## 2014-01-18 MED ORDER — CEFTRIAXONE SODIUM 1 G IJ SOLR
1.0000 g | Freq: Once | INTRAMUSCULAR | Status: AC
  Administered 2014-01-18: 1 g via INTRAMUSCULAR

## 2014-01-18 MED ORDER — LEVOFLOXACIN 500 MG PO TABS: 500.0000 mg | ORAL_TABLET | Freq: Every day | ORAL | Status: DC

## 2014-01-18 NOTE — Progress Notes (Signed)
I have examined this patient along with the student and agree. Previous notes regarding acute illness reviewed in detail.  Initially seen on 1/29 with urinary symptoms and started on treatment for pyelonephritis.  PSA was elevated, however, so it now appears that the diagnosis is actually prostatitis.

## 2014-01-18 NOTE — Telephone Encounter (Signed)
Danny [name],  Danny [name] wants to know if her husband should be taking a good bacteria to counteract all the antibiotics he is taking.   Danny [name]  [phone]

## 2014-01-18 NOTE — Progress Notes (Signed)
Subjective:    Patient ID: Danny [name], male    DOB: Jun 20, 1951, 63 y.o.   MRN: [id number]  HPI  Patient is a 63 year old male who presents for follow up of pyelonephritis.  Patient states he is feeling much better. "Yesterday was the best day I've had in 5 days". Fatigue and appetite seems to be improving. He is afebrile. Denies chills, nausea and vomiting. Denies dysuria. Urinary urgency and frequency have significantly improved. He is currently voiding every 4-5 hours and is not waking up in the night to urinate. Denies abdominal pain, flank pain and low back pain. Continues to take Levaquin 500 mg daily and is drinking lots of fluids.   Review of Systems As above.     Objective:   Physical Exam  Constitutional: He is oriented to person, place, and time. He appears well-developed and well-nourished.  HENT:  Head: Normocephalic and atraumatic.  Right Ear: External ear normal.  Left Ear: External ear normal.  Eyes: Conjunctivae are normal.  Neck: Normal range of motion. Neck supple.  Cardiovascular: Normal rate, regular rhythm and normal heart sounds.   Pulmonary/Chest: Effort normal and breath sounds normal.  Musculoskeletal:       Thoracic back: He exhibits no tenderness and no pain.       Lumbar back: He exhibits no tenderness and no pain.  No CVA tenderness.   Neurological: He is alert and oriented to person, place, and time.  Skin: Skin is warm and dry.  Psychiatric: He has a normal mood and affect. His behavior is normal. Judgment and thought content normal.   Results for orders placed in visit on 01/18/14  POCT CBC      Result Value Range   WBC 8.6  4.6 - 10.2 K/uL   Lymph, poc 1.9  0.6 - 3.4   POC LYMPH PERCENT 22.3  10 - 50 %L   MID (cbc) 0.6  0 - 0.9   POC MID % 7.5  0 - 12 %M   POC Granulocyte 6.0  2 - 6.9   Granulocyte percent 70.2  37 - 80 %G   RBC 4.85  4.69 - 6.13 M/uL   Hemoglobin 14.8  14.1 - 18.1 g/dL   HCT, POC 47.0  43.5 - 53.7 %   MCV 96.9  80  - 97 fL   MCH, POC 30.5  27 - 31.2 pg   MCHC 31.5 (*) 31.8 - 35.4 g/dL   RDW, POC 15.3     Platelet Count, POC 227  142 - 424 K/uL   MPV 9.4  0 - 99.8 fL  POCT UA - MICROSCOPIC ONLY      Result Value Range   WBC, Ur, HPF, POC 12-15     RBC, urine, microscopic neg     Bacteria, U Microscopic neg     Mucus, UA positive     Epithelial cells, urine per micros neg     Crystals, Ur, HPF, POC neg     Casts, Ur, LPF, POC neg     Yeast, UA neg    POCT URINALYSIS DIPSTICK      Result Value Range   Color, UA yellow     Clarity, UA clear     Glucose, UA neg     Bilirubin, UA neg     Ketones, UA neg     Spec Grav, UA >=1.030     Blood, UA neg     pH, UA 5.5  Protein, UA trace     Urobilinogen, UA 0.2     Nitrite, UA neg     Leukocytes, UA Trace         Assessment & Plan:   1. Pyelonephritis - cefTRIAXone (ROCEPHIN) injection 1 g; Inject 1 g into the muscle once. - POCT CBC - POCT UA - Microscopic Only - POCT urinalysis dipstick  2. Prostatitis, acute At this point, appears infection was due to prostatitis because of elevated PSA - Levaquin will be extended for a total of 6 weeks to adequately cover prostatitis. Will repeat urine culture on 01/26/14 and will repeat PSA in 6 weeks. Continue to push fluids.  - levofloxacin (LEVAQUIN) 500 MG tablet; Take 1 tablet (500 mg total) by mouth daily. Complete 6 weeks of treatment  Dispense: 32 tablet; Refill: 0

## 2014-01-18 NOTE — Patient Instructions (Signed)
Keep your appointment with me on 01/26/14.  We'll plan to repeat the urine culture then.  We need to repeat the PSA in 6 weeks.

## 2014-01-19 NOTE — Telephone Encounter (Signed)
It would be appropriate for him to take an OTC Probiotic, yes.

## 2014-01-20 ENCOUNTER — Other Ambulatory Visit: Payer: Self-pay | Admitting: Physician Assistant

## 2014-01-20 NOTE — Telephone Encounter (Signed)
LMOM probiotic would benefit her husband.

## 2014-01-26 ENCOUNTER — Ambulatory Visit (INDEPENDENT_AMBULATORY_CARE_PROVIDER_SITE_OTHER): Payer: 59 | Admitting: Physician Assistant

## 2014-01-26 ENCOUNTER — Encounter: Payer: Self-pay | Admitting: Physician Assistant

## 2014-01-26 VITALS — BP 110/72 | HR 87 | Temp 97.9°F | Resp 16 | Ht 70.0 in | Wt 217.4 lb

## 2014-01-26 DIAGNOSIS — N410 Acute prostatitis: Secondary | ICD-10-CM

## 2014-01-26 DIAGNOSIS — E119 Type 2 diabetes mellitus without complications: Secondary | ICD-10-CM

## 2014-01-26 DIAGNOSIS — I10 Essential (primary) hypertension: Secondary | ICD-10-CM

## 2014-01-26 DIAGNOSIS — E669 Obesity, unspecified: Secondary | ICD-10-CM

## 2014-01-26 DIAGNOSIS — E782 Mixed hyperlipidemia: Secondary | ICD-10-CM

## 2014-01-26 LAB — COMPREHENSIVE METABOLIC PANEL
ALT: 34 U/L (ref 0–53)
AST: 21 U/L (ref 0–37)
Albumin: 4.1 g/dL (ref 3.5–5.2)
Alkaline Phosphatase: 69 U/L (ref 39–117)
BILIRUBIN TOTAL: 0.9 mg/dL (ref 0.2–1.2)
BUN: 18 mg/dL (ref 6–23)
CO2: 24 mEq/L (ref 19–32)
CREATININE: 1.08 mg/dL (ref 0.50–1.35)
Calcium: 9.7 mg/dL (ref 8.4–10.5)
Chloride: 102 mEq/L (ref 96–112)
GLUCOSE: 117 mg/dL — AB (ref 70–99)
Potassium: 4.8 mEq/L (ref 3.5–5.3)
Sodium: 138 mEq/L (ref 135–145)
Total Protein: 7 g/dL (ref 6.0–8.3)

## 2014-01-26 LAB — LIPID PANEL
CHOL/HDL RATIO: 2.9 ratio
Cholesterol: 123 mg/dL (ref 0–200)
HDL: 42 mg/dL (ref >39)
LDL Cholesterol: 62 mg/dL (ref 0–99)
TRIGLYCERIDES: 97 mg/dL (ref <150)
VLDL: 19 mg/dL (ref 0–40)

## 2014-01-26 LAB — GLUCOSE, POCT (MANUAL RESULT ENTRY): POC Glucose: 127 mg/dl — AB (ref 70–99)

## 2014-01-26 LAB — POCT GLYCOSYLATED HEMOGLOBIN (HGB A1C): Hemoglobin A1C: 6.3

## 2014-01-26 NOTE — Progress Notes (Signed)
Subjective:    Patient ID: Danny [name], male    DOB: May 22, 1951, 63 y.o.   MRN: [id number]  HPI  Patient present for follow up of DM, type 2 .   Patient takes blood sugar in the morning and is ranging from 110-160. Reports decreased appetite. Denies polydipsia, polyuria and changes in vision. Currently takes Janumet and Glipizide daily and is tolerating them well. Denies regular exercise. Limits sweets in his diet, trying to incorporate more vegetable and lean protein. Patient sees an eye doctor regularly.   Patient last saw neurology for parkinsonism on 12/21/13. Medication seems to be helping and he is tolerating it well. Wife notes that his mood has greatly improved with medication - he seems to be happier and more focused although he does not have any real interest in doing anything. Patient reports getting very tired when he is standing for long periods of time. He still feels unsteady when walking - "sometimes when I stop I feel like I am going to fall forward". Patient reports that he is not noticing his tremor as much. His wife notes that tremor is better on some days than others but is still present. He is scheduled for follow up with neurology in March 2015.   Patient is currently being treated for prostatis. All symptoms have cleared. Tolerating Levaquin well.   Review of Systems  Respiratory: Negative for shortness of breath and wheezing.   Cardiovascular: Negative for chest pain, palpitations and leg swelling.  Gastrointestinal: Negative for nausea, vomiting, diarrhea, constipation and blood in stool.       Objective:   Physical Exam  Constitutional: He is oriented to person, place, and time. Vital signs are normal. He appears well-developed and well-nourished.  HENT:  Head: Normocephalic and atraumatic.  Right Ear: External ear normal.  Left Ear: External ear normal.  Eyes: Conjunctivae and lids are normal. Pupils are equal, round, and reactive to light.  Fundoscopic  exam:      The right eye shows no arteriolar narrowing, no AV nicking, no exudate, no hemorrhage and no papilledema. The right eye shows red reflex.       The left eye shows no arteriolar narrowing, no AV nicking, no exudate, no hemorrhage and no papilledema. The left eye shows red reflex.  Neck: Normal range of motion. Neck supple.  Cardiovascular: Normal rate, regular rhythm, normal heart sounds and intact distal pulses.   Pulmonary/Chest: Effort normal and breath sounds normal.  Abdominal: There is no CVA tenderness.  Lymphadenopathy:       Head (right side): No submental, no submandibular, no tonsillar, no preauricular, no posterior auricular and no occipital adenopathy present.       Head (left side): No submental, no submandibular, no tonsillar, no preauricular and no posterior auricular adenopathy present.    He has no cervical adenopathy.       Right: No supraclavicular adenopathy present.       Left: No supraclavicular adenopathy present.  Neurological: He is alert and oriented to person, place, and time. He has normal strength.  Reflex Scores:      Patellar reflexes are 2+ on the right side and 2+ on the left side. Skin: Skin is warm and dry.  Psychiatric: He has a normal mood and affect. His behavior is normal. Judgment and thought content normal.   See diabetic foot exam     Assessment & Plan:   1. Type II or unspecified type diabetes mellitus without mention of complication, not  stated as uncontrolled Well controlled. Continue medications as prescribed. Encouraged exercise and healthy diet.  - HM Diabetes Foot Exam - POCT glucose (manual entry) - POCT glycosylated hemoglobin (Hb A1C)  2. Mixed hyperlipidemia Well controlled. Continue medication as prescribed. Encouraged healthy diet and exercise.  - Comprehensive metabolic panel - Lipid panel  3. Essential hypertension, benign Well controlled. Continue medication as prescribed.  - Comprehensive metabolic panel  4.  Obesity (BMI 30-39.9) Encouraged healthy diet and exercise.  5. Prostatitis, acute Complete full prescription of Levaquin. Will recheck PSA upon completion of antibiotic.

## 2014-01-26 NOTE — Progress Notes (Signed)
I have examined this patient along with the student and agree.  

## 2014-01-26 NOTE — Patient Instructions (Signed)
Keep up the great work. Get active, even when you don't want to be.  You will feel better. Your goal is 150 minutes/week of cardiovascular exercise (walking, square dancing, anything!)

## 2014-01-27 LAB — URINE CULTURE
Colony Count: NO GROWTH
Organism ID, Bacteria: NO GROWTH

## 2014-02-01 ENCOUNTER — Other Ambulatory Visit: Payer: Self-pay | Admitting: Physician Assistant

## 2014-02-04 ENCOUNTER — Other Ambulatory Visit: Payer: Self-pay | Admitting: Physician Assistant

## 2014-02-04 MED ORDER — SITAGLIPTIN PHOS-METFORMIN HCL 50-1000 MG PO TABS: ORAL_TABLET | ORAL | Status: DC

## 2014-02-15 ENCOUNTER — Ambulatory Visit: Payer: 59 | Admitting: Neurology

## 2014-02-22 ENCOUNTER — Other Ambulatory Visit: Payer: Self-pay | Admitting: Physician Assistant

## 2014-03-09 ENCOUNTER — Ambulatory Visit (INDEPENDENT_AMBULATORY_CARE_PROVIDER_SITE_OTHER): Payer: 59 | Admitting: Physician Assistant

## 2014-03-09 ENCOUNTER — Encounter: Payer: Self-pay | Admitting: Physician Assistant

## 2014-03-09 VITALS — BP 122/79 | HR 75 | Temp 98.3°F | Resp 16 | Ht 69.5 in | Wt 220.0 lb

## 2014-03-09 DIAGNOSIS — N411 Chronic prostatitis: Secondary | ICD-10-CM

## 2014-03-09 DIAGNOSIS — N410 Acute prostatitis: Secondary | ICD-10-CM

## 2014-03-09 NOTE — Patient Instructions (Signed)
Make an effort to get good rest-and enough of it.  Don't try to do too many things, even fun things.

## 2014-03-10 LAB — PSA: PSA: 1.92 ng/mL (ref <=4.00)

## 2014-03-11 NOTE — Progress Notes (Signed)
   Subjective:    Patient ID: Danny [name], male    DOB: Jun 15, 1951, 63 y.o.   MRN: [id number]   PCP: Ariauna Farabee, PA-C  Chief Complaint  Patient presents with  . Follow-up    uti    Medications, allergies, past medical history, surgical history, family history, social history and problem list reviewed and updated.  HPI  Presents for repeat PSA. Recall that his PSA was elevated during recent evaluation of prostatitis. He feels well, and his urinary symptoms are completely resolved.   Review of Systems     Objective:   Physical Exam BP 122/79  Pulse 75  Temp(Src) 98.3 F (36.8 C) (Oral)  Resp 16  Ht 5' 9.5" (1.765 m)  Wt 220 lb (99.791 kg)  BMI 32.03 kg/m2  SpO2 96% WDWNWM, A&O x 3. Normal respiratory effort. Skin is warm and dry.       Assessment & Plan:  1. Prostatitis, acute Resolved. - PSA  Follow-up in 8 weeks, as planned, re: DM, Lipids and Parkinson's  [name], PA-C Physician Assistant-Certified [facility]

## 2014-04-19 ENCOUNTER — Encounter: Payer: Self-pay | Admitting: Neurology

## 2014-04-19 ENCOUNTER — Ambulatory Visit (INDEPENDENT_AMBULATORY_CARE_PROVIDER_SITE_OTHER): Payer: 59 | Admitting: Neurology

## 2014-04-19 VITALS — BP 122/73 | HR 87 | Ht 70.5 in | Wt 223.0 lb

## 2014-04-19 DIAGNOSIS — G20 Parkinson's disease: Secondary | ICD-10-CM

## 2014-04-19 DIAGNOSIS — G4752 REM sleep behavior disorder: Secondary | ICD-10-CM

## 2014-04-19 DIAGNOSIS — K117 Disturbances of salivary secretion: Secondary | ICD-10-CM

## 2014-04-19 MED ORDER — PRAMIPEXOLE DIHYDROCHLORIDE 0.25 MG PO TABS: 0.2500 mg | ORAL_TABLET | Freq: Three times a day (TID) | ORAL | Status: DC

## 2014-04-19 NOTE — Patient Instructions (Signed)
Overall you are doing fairly well but I do want to suggest a few things today:   Remember to drink plenty of fluid, eat healthy meals and do not skip any meals. Try to eat protein with a every meal and eat a healthy snack such as fruit or nuts in between meals. Try to keep a regular sleep-wake schedule and try to exercise daily, particularly in the form of walking, 20-30 minutes a day, if you can.   As far as your medications are concerned, I would like to suggest the following: 1)Please increase your Mirapex IR to 0.25mg  three times a day. In the meantime you can take 2 of your 0.125mg  tablets three times a day until you use them up  In the future you may benefit from physical therapy, specifically a program called [facility]  I would like to see you back in 4 months, sooner if we need to. Please call us with any interim questions, concerns, problems, updates or refill requests.   My clinical assistant and will answer any of your questions and relay your messages to me and also relay most of my messages to you.   Our phone number is [phone]. We also have an after hours call service for urgent matters and there is a physician on-call for urgent questions. For any emergencies you know to call 911 or go to the nearest emergency room

## 2014-04-19 NOTE — Progress Notes (Signed)
[facility]    Provider:  Dr Janann Colonel Referring Provider: [name], PA-C Primary Care Physician:  JEFFERY,CHELLE, PA-C  CC:  tremor  62y/o gentleman with history of PD presenting for follow up visit. His last visit was 12/2013 at which time he was started on Mirapex 0.125mg  TID.   Wife notes some improvement in his overall status, he seems to be walking better, face is more expressive. He continues to be cautious with his walking. They note when he gets fatigued the symptoms come out more. He feels he is stable overall, with a slight improvement. Tolerating medication well, no adverse effects, no ICD. No acute changes at this time.    Initial visit 10/2013: Patient feels he is asymptomatic, his wife notes that the symptoms for list the past 4-6 months. She notes tremor in his hands, masked face, change in his walking. She feels he is in good and bad days, reports is currently doing well. Tremor is predominantly in his right hand, minimal in the left. Appears to be both a rest action and postural tremor. He notes some stiffness of his muscles, generalized bradykinesia, slowing down of his walking. Feels more unsteady walking, has had some falls in the past. He feels her handwriting has gotten messier, no change in size, no micrographia. His wife notes his speech is more hesitant and slow, no hypophonia. He denies any difficulty sleeping, wife notes long history of REM behavior disorder. No constipation, no difficulty swallowing. He has some excessive saliva production. He does note a chronic lack of sense of smell.  No prior history of strokes. Does have DM. No exposure to dopamine blocking agents. No heavy metal exposure. Has well water. No family history of neurodegenerative disorders.   Review of Systems: Out of a complete 14 system review, the patient complains of only the following symptoms, and all other reviewed systems are negative. Positive for tremor or  sleep talking and acting out dreams drooling  History   Social History  . Marital Status: Married    Spouse Name: [name]    Number of Children: 3  . Years of Education: 16   Occupational History  . Engineer    Social History Main Topics  . Smoking status: Former Smoker -- 1.50 packs/day for 15 years    Types: Cigarettes    Quit date: 12/17/1982  . Smokeless tobacco: Never Used     Comment: over 40 yrs  . Alcohol Use: Yes     Comment: very rare  . Drug Use: No  . Sexual Activity: Yes    Partners: Female   Other Topics Concern  . Not on file   Social History Narrative   [name], motorcycle rider.  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  He has 3 adult children.  His wife had 2 children, one of whom is deceased.   Patient is working full-time.   Patient has a Bachelor's degree.   Patient is right-handed.   Patient drinks 6 cups of coffee daily.    Family History  Problem Relation Age of Onset  . Diabetes Mother   . Diabetes Sister   . Diabetes Daughter   . Cancer Father   . Heart disease Father     Past Medical History  Diagnosis Date  . Type II or unspecified type diabetes mellitus without mention of complication, not stated as uncontrolled   . Mixed hyperlipidemia   . OA (osteoarthritis) of knee     right  . Essential  hypertension, benign   . Obesity, unspecified     Past Surgical History  Procedure Laterality Date  . Knee arthroscopy      right  . Ganglion cyst excision  age 89 years    right  . Wrist surgery  04/2012    Current Outpatient Prescriptions  Medication Sig Dispense Refill  . aspirin 81 MG tablet Take 81 mg by mouth daily.        . enalapril (VASOTEC) 2.5 MG tablet Take 1 tablet by mouth  daily  90 tablet  0  . Ginger, Zingiber officinalis, 550 MG CAPS Take 550 mg by mouth daily.        Marland Kitchen GLIPIZIDE XL 2.5 MG 24 hr tablet Take 1 tablet by mouth  daily  90 tablet  1  . Glucosamine-Chondroit-Vit C-Mn (GLUCOSAMINE CHONDR 1500 COMPLX PO) Take  1,500 mg by mouth daily.        Marland Kitchen glucose blood test strip Use as instructed  300 each  4  . pramipexole (MIRAPEX) 0.125 MG tablet One tablet by mouth 3 times a day  270 tablet  3  . simvastatin (ZOCOR) 40 MG tablet Take one-half tablet by  mouth daily  45 tablet  1  . sitaGLIPtin-metformin (JANUMET) 50-1000 MG per tablet Take 1 tablet by mouth  twice a day with meals  180 tablet  1  . vitamin C (ASCORBIC ACID) 500 MG tablet Take 500 mg by mouth daily.         No current facility-administered medications for this visit.    Allergies as of 04/19/2014 - Review Complete 04/19/2014  Allergen Reaction Noted  . Codeine Nausea And Vomiting 10/09/2011    Vitals: BP 122/73  Pulse 87  Ht 5' 10.5" (1.791 m)  Wt 223 lb (101.152 kg)  BMI 31.53 kg/m2 Last Weight:  Wt Readings from Last 1 Encounters:  04/19/14 223 lb (101.152 kg)   Last Height:   Ht Readings from Last 1 Encounters:  04/19/14 5' 10.5" (1.791 m)     Physical exam: Exam: Gen: NAD, conversant Eyes: anicteric sclerae, moist conjunctivae HENT: Atraumatic, oropharynx clear Neck: Trachea midline; supple,  Lungs: CTA, no wheezing, rales, rhonic                          CV: RRR, no MRG Abdomen: Soft, non-tender;  Extremities: No peripheral edema  Skin: Normal temperature, no rash,  Psych: Appropriate affect, pleasant  Neuro: MS: AA&Ox3, appropriately interactive, normal affect   Speech: fluent w/o paraphasic error  Memory: good recent and remote recall  CN: PERRL, EOMI no nystagmus, no ptosis, sensation intact to LT V1-V3 bilat, face symmetric, no weakness, hearing grossly intact, palate elevates symmetrically, shoulder shrug 5/5 bilat,  tongue protrudes midline, no fasiculations noted.  Motor: normal bulk and tone Strength: 5/5  In all extremities  Reflexes: symmetrical, bilat downgoing toes  Sens: LT intact in all extremities  Brief Motor UPDRS  Speech: wnl  Facial Expression: mild masked facies(improved  compared to prior visit)  Tremor: Rest R 0 L 0 Action/postural R 1 L 0 Rigidity: Increased tone/cogwheel rigidity bilat UE R>L Finger taps:   R:1 L:1  Open/close hands: R:1 L:1  Foot taps: R:1 L:1  Gait/FOG: Arises from chair without pushing off, upright, minimal shuffling, decreased arm swing bilat R>L, negative Romberg, normal Pull test    Assessment:  After physical and neurologic examination, review of laboratory studies, imaging, neurophysiology testing and pre-existing records, assessment  will be reviewed on the problem list.  Plan:  Treatment plan and additional workup will be reviewed under Problem List.  1)Parkinsonism 2)RBD 3)Sialorrhea  62y/o gentleman presenting for follow up evaluation of tremor, generalized bradykinesia which is consistent with a diagnosis of a parkinsonism. Based on good response to a DA, suspect this likely represents idiopathic PD. Noted improvement in motor function with addition of Mirapex. Will increase dose to 0.25mg  TID. Follow up in 4 months or earlier if needed.

## 2014-05-11 ENCOUNTER — Encounter: Payer: Self-pay | Admitting: Physician Assistant

## 2014-05-11 ENCOUNTER — Ambulatory Visit (INDEPENDENT_AMBULATORY_CARE_PROVIDER_SITE_OTHER): Payer: 59 | Admitting: Physician Assistant

## 2014-05-11 VITALS — BP 120/80 | HR 81 | Temp 98.3°F | Resp 16 | Ht 69.75 in | Wt 223.2 lb

## 2014-05-11 DIAGNOSIS — G20 Parkinson's disease: Secondary | ICD-10-CM

## 2014-05-11 DIAGNOSIS — G20A1 Parkinson's disease without dyskinesia, without mention of fluctuations: Secondary | ICD-10-CM

## 2014-05-11 DIAGNOSIS — E669 Obesity, unspecified: Secondary | ICD-10-CM

## 2014-05-11 DIAGNOSIS — I10 Essential (primary) hypertension: Secondary | ICD-10-CM

## 2014-05-11 DIAGNOSIS — Z1211 Encounter for screening for malignant neoplasm of colon: Secondary | ICD-10-CM

## 2014-05-11 DIAGNOSIS — E782 Mixed hyperlipidemia: Secondary | ICD-10-CM

## 2014-05-11 DIAGNOSIS — E119 Type 2 diabetes mellitus without complications: Secondary | ICD-10-CM

## 2014-05-11 DIAGNOSIS — Z0000 Encounter for general adult medical examination without abnormal findings: Secondary | ICD-10-CM

## 2014-05-11 LAB — CBC WITH DIFFERENTIAL/PLATELET
Basophils Absolute: 0 10*3/uL (ref 0.0–0.1)
Basophils Relative: 0 % (ref 0–1)
EOS PCT: 3 % (ref 0–5)
Eosinophils Absolute: 0.2 10*3/uL (ref 0.0–0.7)
HEMATOCRIT: 42 % (ref 39.0–52.0)
HEMOGLOBIN: 14.7 g/dL (ref 13.0–17.0)
LYMPHS ABS: 1.7 10*3/uL (ref 0.7–4.0)
LYMPHS PCT: 23 % (ref 12–46)
MCH: 31.1 pg (ref 26.0–34.0)
MCHC: 35 g/dL (ref 30.0–36.0)
MCV: 88.8 fL (ref 78.0–100.0)
MONOS PCT: 8 % (ref 3–12)
Monocytes Absolute: 0.6 10*3/uL (ref 0.1–1.0)
NEUTROS ABS: 4.8 10*3/uL (ref 1.7–7.7)
Neutrophils Relative %: 66 % (ref 43–77)
Platelets: 199 10*3/uL (ref 150–400)
RBC: 4.73 MIL/uL (ref 4.22–5.81)
RDW: 14.2 % (ref 11.5–15.5)
WBC: 7.2 10*3/uL (ref 4.0–10.5)

## 2014-05-11 LAB — COMPREHENSIVE METABOLIC PANEL
ALT: 34 U/L (ref 0–53)
AST: 21 U/L (ref 0–37)
Albumin: 4.6 g/dL (ref 3.5–5.2)
Alkaline Phosphatase: 44 U/L (ref 39–117)
BUN: 18 mg/dL (ref 6–23)
CO2: 25 meq/L (ref 19–32)
CREATININE: 1.07 mg/dL (ref 0.50–1.35)
Calcium: 10 mg/dL (ref 8.4–10.5)
Chloride: 104 mEq/L (ref 96–112)
GLUCOSE: 128 mg/dL — AB (ref 70–99)
POTASSIUM: 4.5 meq/L (ref 3.5–5.3)
Sodium: 138 mEq/L (ref 135–145)
Total Bilirubin: 1.2 mg/dL (ref 0.2–1.2)
Total Protein: 6.9 g/dL (ref 6.0–8.3)

## 2014-05-11 LAB — TSH: TSH: 3.708 u[IU]/mL (ref 0.350–4.500)

## 2014-05-11 LAB — LIPID PANEL
CHOL/HDL RATIO: 2.4 ratio
CHOLESTEROL: 120 mg/dL (ref 0–200)
HDL: 50 mg/dL (ref >39)
LDL Cholesterol: 49 mg/dL (ref 0–99)
Triglycerides: 103 mg/dL (ref <150)
VLDL: 21 mg/dL (ref 0–40)

## 2014-05-11 LAB — GLUCOSE, POCT (MANUAL RESULT ENTRY): POC Glucose: 137 mg/dl — AB (ref 70–99)

## 2014-05-11 LAB — POCT GLYCOSYLATED HEMOGLOBIN (HGB A1C): HEMOGLOBIN A1C: 6.4

## 2014-05-11 LAB — MICROALBUMIN, URINE: Microalb, Ur: 0.5 mg/dL (ref 0.00–1.89)

## 2014-05-11 MED ORDER — ENALAPRIL MALEATE 2.5 MG PO TABS: ORAL_TABLET | ORAL | Status: DC

## 2014-05-11 NOTE — Progress Notes (Addendum)
Subjective:    Patient ID: Danny [name], male    DOB: 1951/02/22, 63 y.o.   MRN: [id number]   PCP: Vinh Sachs, PA-C  Chief Complaint  Patient presents with  . Annual Exam      Active Ambulatory Problems    Diagnosis Date Noted  . Type II or unspecified type diabetes mellitus without mention of complication, not stated as uncontrolled 10/09/2011  . Mixed hyperlipidemia 10/09/2011  . OA (osteoarthritis) of knee 10/09/2011  . Essential hypertension, benign 10/09/2011  . Disorder of ligament of right wrist 01/29/2012  . Confusional arousals 02/13/2013  . Obesity (BMI 30-39.9) 07/14/2013  . Parkinson disease 01/14/2014   Resolved Ambulatory Problems    Diagnosis Date Noted  . No Resolved Ambulatory Problems   Past Medical History  Diagnosis Date  . Obesity, unspecified   . Cataract   . Parkinsons     Past Surgical History  Procedure Laterality Date  . Knee arthroscopy      right  . Ganglion cyst excision  age 82 years    right  . Wrist surgery  04/2012    Allergies  Allergen Reactions  . Codeine Nausea And Vomiting    Prior to Admission medications   Medication Sig Start Date End Date Taking? Authorizing Provider  aspirin 81 MG tablet Take 81 mg by mouth daily.     Yes Historical Provider, MD  enalapril (VASOTEC) 2.5 MG tablet Take 1 tablet by mouth  daily 05/11/14  Yes Shatisha Falter S Tyshae Stair, PA-C  Ginger, Zingiber officinalis, 550 MG CAPS Take 550 mg by mouth daily.     Yes Historical Provider, MD  GLIPIZIDE XL 2.5 MG 24 hr tablet Take 1 tablet by mouth  daily 02/22/14  Yes Rever Pichette S Elaina Cara, PA-C  Glucosamine-Chondroit-Vit C-Mn (GLUCOSAMINE CHONDR 1500 COMPLX PO) Take 1,500 mg by mouth daily.     Yes Historical Provider, MD  glucose blood test strip Use as instructed 09/15/13  Yes Ryan M Dunn, PA-C  pramipexole (MIRAPEX) 0.25 MG tablet Take 1 tablet (0.25 mg total) by mouth 3 (three) times daily. 04/19/14  Yes Hulen Luster, DO  simvastatin (ZOCOR) 40 MG  tablet Take one-half tablet by  mouth daily 02/22/14  Yes Orvella Digiulio S Kaislyn Gulas, PA-C  sitaGLIPtin-metformin (JANUMET) 50-1000 MG per tablet Take 1 tablet by mouth  twice a day with meals 02/04/14  Yes Laurynn Mccorvey S Haydin Calandra, PA-C  vitamin C (ASCORBIC ACID) 500 MG tablet Take 500 mg by mouth daily.     Yes Historical Provider, MD    History   Social History  . Marital Status: Married    Spouse Name: [name]    Number of Children: 3  . Years of Education: 16   Occupational History  . Engineer    Social History Main Topics  . Smoking status: Former Smoker -- 1.50 packs/day for 15 years    Types: Cigarettes    Quit date: 12/17/1982  . Smokeless tobacco: Never Used     Comment: over 40 yrs  . Alcohol Use: No     Comment: very rare  . Drug Use: No  . Sexual Activity: Yes    Partners: Female   Other Topics Concern  . None   Social History Narrative   Research officer, trade union, motorcycle rider.  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  He has 3 adult children.  His wife had 2 children, one of whom is deceased.   Patient is working full-time.   Patient has a Bachelor's degree.  Patient is right-handed.   Patient drinks 6 cups of coffee daily.    family history includes Cancer in his father; Diabetes in his daughter, mother, and sister; Heart disease in his father. indicated that his mother is alive. He indicated that his father is deceased. He indicated that both of his sisters are alive. He indicated that his brother is alive. He indicated that his maternal grandmother is deceased. He indicated that his maternal grandfather is deceased. He indicated that his paternal grandmother is deceased. He indicated that his paternal grandfather is deceased. He indicated that his daughter is alive. He indicated that both of his sons are alive.   HPI  Mr. Mosqueda is here with his wife for annual CPE.  Since his last physical, he has been diagnosed with Parkinson's.  He's followed at [facility] by Dr. Janann Colonel, and at his most recent  visit, the Mirapex dose was increased. His wife notes that he smiles more, and seems to have gotten his sense of humor back.    Review of Systems  Constitutional: Positive for fatigue.  HENT: Positive for drooling.   Eyes: Negative.   Respiratory: Negative.   Cardiovascular: Negative.   Gastrointestinal: Negative.   Endocrine: Negative.   Genitourinary: Negative.   Musculoskeletal: Positive for back pain, gait problem and myalgias.  Skin: Negative.   Allergic/Immunologic: Negative.   Neurological: Positive for tremors.  Hematological: Negative.   Psychiatric/Behavioral: Negative.        Objective:   Physical Exam  Constitutional: He is oriented to person, place, and time. Vital signs are normal. He appears well-developed and well-nourished. He is active and cooperative.  Non-toxic appearance. He does not have a sickly appearance. He does not appear ill. No distress.  BP 120/80  Pulse 81  Temp(Src) 98.3 F (36.8 C) (Oral)  Resp 16  Ht 5' 9.75" (1.772 m)  Wt 223 lb 3.2 oz (101.243 kg)  BMI 32.24 kg/m2  SpO2 95%   HENT:  Head: Normocephalic and atraumatic.  Right Ear: Hearing, tympanic membrane, external ear and ear canal normal.  Left Ear: Hearing, tympanic membrane, external ear and ear canal normal.  Nose: Nose normal.  Mouth/Throat: Uvula is midline, oropharynx is clear and moist and mucous membranes are normal. He does not have dentures. No oral lesions. No trismus in the jaw. Normal dentition. No dental abscesses, uvula swelling, lacerations or dental caries.  Eyes: Conjunctivae, EOM and lids are normal. Pupils are equal, round, and reactive to light. Right eye exhibits no discharge. Left eye exhibits no discharge. No scleral icterus.  Fundoscopic exam:      The right eye shows no arteriolar narrowing, no AV nicking, no exudate, no hemorrhage and no papilledema.       The left eye shows no arteriolar narrowing, no AV nicking, no exudate, no hemorrhage and no papilledema.   Neck: Normal range of motion, full passive range of motion without pain and phonation normal. Neck supple. No spinous process tenderness and no muscular tenderness present. No rigidity. No tracheal deviation, no edema, no erythema and normal range of motion present. No thyromegaly present.  Cardiovascular: Normal rate, regular rhythm, S1 normal, S2 normal, normal heart sounds, intact distal pulses and normal pulses.  Exam reveals no gallop and no friction rub.   No murmur heard. Pulmonary/Chest: Effort normal and breath sounds normal. No respiratory distress. He has no wheezes. He has no rales.  Abdominal: Soft. Normal appearance and bowel sounds are normal. He exhibits no distension and no mass.  There is no hepatosplenomegaly. There is no tenderness. There is no rebound and no guarding. No hernia. Hernia confirmed negative in the right inguinal area and confirmed negative in the left inguinal area.  Genitourinary: Rectum normal, prostate normal, testes normal and penis normal. Guaiac negative stool. Circumcised. No phimosis, paraphimosis, hypospadias, penile erythema or penile tenderness. No discharge found.  Musculoskeletal: Normal range of motion. He exhibits no edema and no tenderness.       Right shoulder: Normal.       Left shoulder: Normal.       Right elbow: Normal.      Left elbow: Normal.       Right wrist: Normal.       Left wrist: Normal.       Right hip: Normal.       Left hip: Normal.       Right knee: Normal.       Left knee: Normal.       Right ankle: Normal. Achilles tendon normal.       Left ankle: Normal. Achilles tendon normal.       Cervical back: Normal. He exhibits normal range of motion, no tenderness, no bony tenderness, no swelling, no edema, no deformity, no laceration, no pain, no spasm and normal pulse.       Thoracic back: Normal.       Lumbar back: Normal.       Right upper arm: Normal.       Left upper arm: Normal.       Right forearm: Normal.       Left  forearm: Normal.       Right hand: Normal.       Left hand: Normal.       Right upper leg: Normal.       Left upper leg: Normal.       Right lower leg: Normal.       Left lower leg: Normal.       Right foot: Normal.       Left foot: Normal.  Lymphadenopathy:       Head (right side): No submental, no submandibular, no tonsillar, no preauricular, no posterior auricular and no occipital adenopathy present.       Head (left side): No submental, no submandibular, no tonsillar, no preauricular, no posterior auricular and no occipital adenopathy present.    He has no cervical adenopathy.       Right: No inguinal and no supraclavicular adenopathy present.       Left: No inguinal and no supraclavicular adenopathy present.  Neurological: He is alert and oriented to person, place, and time. He has normal strength and normal reflexes. He displays no tremor. No cranial nerve deficit. He exhibits normal muscle tone. Coordination and gait normal.  Skin: Skin is warm, dry and intact. No abrasion, no ecchymosis, no laceration, no lesion and no rash noted. He is not diaphoretic. No cyanosis or erythema. No pallor. Nails show no clubbing.  Psychiatric: He has a normal mood and affect. His speech is normal and behavior is normal. Judgment and thought content normal. Cognition and memory are normal.   See diabetic foot exam.   Results for orders placed in visit on 05/11/14  GLUCOSE, POCT (MANUAL RESULT ENTRY)      Result Value Ref Range   POC Glucose 137 (*) 70 - 99 mg/dl  POCT GLYCOSYLATED HEMOGLOBIN (HGB A1C)      Result Value Ref Range   Hemoglobin A1C 6.4  Assessment & Plan:  1. Annual physical exam Age appropriate anticipatory guidance provided. Vaccinations are current. Colonoscopy due.  2. Type II or unspecified type diabetes mellitus without mention of complication, not stated as uncontrolled Well-controlled.  Continue current regimen. - Comprehensive metabolic panel -  Microalbumin, urine - POCT glucose (manual entry) - POCT glycosylated hemoglobin (Hb A1C)  3. Mixed hyperlipidemia Await labs; continue healthy eating and regular exercise, and glucose control. - Lipid panel  4. Essential hypertension, benign Controlled. Continue current regimen. - enalapril (VASOTEC) 2.5 MG tablet; Take 1 tablet by mouth  daily  Dispense: 90 tablet; Refill: 3 - CBC with Differential - TSH  5. Obesity (BMI 30-39.9) Continue healthy eating, regular exercise.  6. Parkinson disease Continue per Dr. Janann Colonel.  7. Screening for colon cancer Last colonoscopy was 10 years ago with Dr. Carlean Purl.   - Ambulatory referral to Gastroenterology  Return in about 3 months (around 08/11/2014) for follow-up diabetes.  [name], PA-C Physician Assistant-Certified [facility]

## 2014-05-11 NOTE — Progress Notes (Deleted)
Subjective:    Patient ID: Danny [name], male    DOB: 07-05-1951, 63 y.o.   MRN: [id number]   PCP: Berline Semrad, PA-C  Chief Complaint  Patient presents with  . Annual Exam      Active Ambulatory Problems    Diagnosis Date Noted  . Type II or unspecified type diabetes mellitus without mention of complication, not stated as uncontrolled 10/09/2011  . Mixed hyperlipidemia 10/09/2011  . OA (osteoarthritis) of knee 10/09/2011  . Essential hypertension, benign 10/09/2011  . Disorder of ligament of right wrist 01/29/2012  . Confusional arousals 02/13/2013  . Obesity (BMI 30-39.9) 07/14/2013  . Parkinson disease 01/14/2014   Resolved Ambulatory Problems    Diagnosis Date Noted  . No Resolved Ambulatory Problems   Past Medical History  Diagnosis Date  . Obesity, unspecified     Past Surgical History  Procedure Laterality Date  . Knee arthroscopy      right  . Ganglion cyst excision  age 75 years    right  . Wrist surgery  04/2012    Allergies  Allergen Reactions  . Codeine Nausea And Vomiting    Prior to Admission medications   Medication Sig Start Date End Date Taking? Authorizing Provider  aspirin 81 MG tablet Take 81 mg by mouth daily.     Yes Historical Provider, MD  enalapril (VASOTEC) 2.5 MG tablet Take 1 tablet by mouth  daily 01/20/14  Yes Heather M Marte, PA-C  Ginger, Zingiber officinalis, 550 MG CAPS Take 550 mg by mouth daily.     Yes Historical Provider, MD  GLIPIZIDE XL 2.5 MG 24 hr tablet Take 1 tablet by mouth  daily 02/22/14  Yes Nylan Nakatani S Zetta Stoneman, PA-C  Glucosamine-Chondroit-Vit C-Mn (GLUCOSAMINE CHONDR 1500 COMPLX PO) Take 1,500 mg by mouth daily.     Yes Historical Provider, MD  glucose blood test strip Use as instructed 09/15/13  Yes Ryan M Dunn, PA-C  pramipexole (MIRAPEX) 0.25 MG tablet Take 1 tablet (0.25 mg total) by mouth 3 (three) times daily. 04/19/14  Yes Hulen Luster, DO  simvastatin (ZOCOR) 40 MG tablet Take one-half tablet by   mouth daily 02/22/14  Yes Derell Bruun S Delonda Coley, PA-C  sitaGLIPtin-metformin (JANUMET) 50-1000 MG per tablet Take 1 tablet by mouth  twice a day with meals 02/04/14  Yes Eliette Drumwright S Aryona Sill, PA-C  vitamin C (ASCORBIC ACID) 500 MG tablet Take 500 mg by mouth daily.     Yes Historical Provider, MD    History   Social History  . Marital Status: Married    Spouse Name: [name]    Number of Children: 3  . Years of Education: 16   Occupational History  . Engineer    Social History Main Topics  . Smoking status: Former Smoker -- 1.50 packs/day for 15 years    Types: Cigarettes    Quit date: 12/17/1982  . Smokeless tobacco: Never Used     Comment: over 40 yrs  . Alcohol Use: No     Comment: very rare  . Drug Use: No  . Sexual Activity: Yes    Partners: Female   Other Topics Concern  . None   Social History Narrative   Research officer, trade union, motorcycle rider.  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  He has 3 adult children.  His wife had 2 children, one of whom is deceased.   Patient is working full-time.   Patient has a Bachelor's degree.   Patient is right-handed.   Patient drinks  6 cups of coffee daily.    family history includes Cancer in his father; Diabetes in his daughter, mother, and sister; Heart disease in his father. indicated that his mother is alive. He indicated that his father is deceased. He indicated that both of his sisters are alive. He indicated that his brother is alive. He indicated that his maternal grandmother is deceased. He indicated that his maternal grandfather is deceased. He indicated that his paternal grandmother is deceased. He indicated that his paternal grandfather is deceased. He indicated that his daughter is alive. He indicated that both of his sons are alive.   HPI   Review of Systems     Objective:   Physical Exam        Assessment & Plan:

## 2014-05-11 NOTE — Patient Instructions (Signed)

## 2014-05-19 ENCOUNTER — Encounter: Payer: Self-pay | Admitting: Physician Assistant

## 2014-06-08 ENCOUNTER — Encounter: Payer: Self-pay | Admitting: Physician Assistant

## 2014-06-10 ENCOUNTER — Other Ambulatory Visit: Payer: Self-pay | Admitting: Physician Assistant

## 2014-06-29 ENCOUNTER — Ambulatory Visit: Payer: 59 | Admitting: Physician Assistant

## 2014-07-05 ENCOUNTER — Encounter: Payer: Self-pay | Admitting: Physician Assistant

## 2014-08-02 ENCOUNTER — Encounter: Payer: Self-pay | Admitting: Physician Assistant

## 2014-08-04 ENCOUNTER — Ambulatory Visit (INDEPENDENT_AMBULATORY_CARE_PROVIDER_SITE_OTHER): Payer: 59 | Admitting: Physician Assistant

## 2014-08-04 VITALS — BP 100/60 | HR 88 | Temp 98.5°F | Resp 16 | Ht 69.0 in | Wt 219.0 lb

## 2014-08-04 DIAGNOSIS — I10 Essential (primary) hypertension: Secondary | ICD-10-CM

## 2014-08-04 DIAGNOSIS — R5383 Other fatigue: Secondary | ICD-10-CM

## 2014-08-04 DIAGNOSIS — E782 Mixed hyperlipidemia: Secondary | ICD-10-CM

## 2014-08-04 DIAGNOSIS — E119 Type 2 diabetes mellitus without complications: Secondary | ICD-10-CM

## 2014-08-04 DIAGNOSIS — R5381 Other malaise: Secondary | ICD-10-CM

## 2014-08-04 LAB — POCT CBC
Granulocyte percent: 66.2 %G (ref 37–80)
HCT, POC: 48.9 % (ref 43.5–53.7)
HEMOGLOBIN: 15.8 g/dL (ref 14.1–18.1)
Lymph, poc: 1.8 (ref 0.6–3.4)
MCH: 30.5 pg (ref 27–31.2)
MCHC: 32.3 g/dL (ref 31.8–35.4)
MCV: 94.2 fL (ref 80–97)
MID (cbc): 0.4 (ref 0–0.9)
MPV: 8.6 fL (ref 0–99.8)
POC Granulocyte: 4.2 (ref 2–6.9)
POC LYMPH PERCENT: 28.2 %L (ref 10–50)
POC MID %: 5.6 % (ref 0–12)
Platelet Count, POC: 190 10*3/uL (ref 142–424)
RBC: 5.2 M/uL (ref 4.69–6.13)
RDW, POC: 15.2 %
WBC: 6.4 10*3/uL (ref 4.6–10.2)

## 2014-08-04 LAB — COMPREHENSIVE METABOLIC PANEL
ALK PHOS: 50 U/L (ref 39–117)
ALT: 40 U/L (ref 0–53)
AST: 27 U/L (ref 0–37)
Albumin: 4.5 g/dL (ref 3.5–5.2)
BILIRUBIN TOTAL: 0.8 mg/dL (ref 0.2–1.2)
BUN: 16 mg/dL (ref 6–23)
CO2: 27 mEq/L (ref 19–32)
CREATININE: 1.07 mg/dL (ref 0.50–1.35)
Calcium: 10.3 mg/dL (ref 8.4–10.5)
Chloride: 100 mEq/L (ref 96–112)
Glucose, Bld: 105 mg/dL — ABNORMAL HIGH (ref 70–99)
Potassium: 4.4 mEq/L (ref 3.5–5.3)
Sodium: 136 mEq/L (ref 135–145)
Total Protein: 7.2 g/dL (ref 6.0–8.3)

## 2014-08-04 LAB — GLUCOSE, POCT (MANUAL RESULT ENTRY): POC GLUCOSE: 111 mg/dL — AB (ref 70–99)

## 2014-08-04 LAB — LIPID PANEL
CHOL/HDL RATIO: 2.5 ratio
Cholesterol: 122 mg/dL (ref 0–200)
HDL: 48 mg/dL (ref >39)
LDL CALC: 54 mg/dL (ref 0–99)
Triglycerides: 101 mg/dL (ref <150)
VLDL: 20 mg/dL (ref 0–40)

## 2014-08-04 LAB — POCT GLYCOSYLATED HEMOGLOBIN (HGB A1C): HEMOGLOBIN A1C: 6.2

## 2014-08-04 LAB — TSH: TSH: 3.534 u[IU]/mL (ref 0.350–4.500)

## 2014-08-04 NOTE — Patient Instructions (Addendum)
Try cutting the enalapril (Vasotec) in half.  If you can't, try taking it only every other day.  Keep up the great work!

## 2014-08-04 NOTE — Progress Notes (Signed)
Subjective:    Patient ID: Danny [name], male    DOB: 1951/06/19, 63 y.o.   MRN: [id number]   PCP: Antanasia Kaczynski, PA-C  Chief Complaint  Patient presents with  . Follow-up    Diabetes    Medications, allergies, past medical history, surgical history, family history, social history and problem list reviewed and updated.  Prior to Admission medications   Medication Sig Start Date End Date Taking? Authorizing Provider  aspirin 81 MG tablet Take 81 mg by mouth daily.     Yes Historical Provider, MD  enalapril (VASOTEC) 2.5 MG tablet Take 1 tablet by mouth  daily 05/11/14  Yes Ahmad Vanwey S Antoine Vandermeulen, PA-C  Ginger, Zingiber officinalis, 550 MG CAPS Take 550 mg by mouth daily.     Yes Historical Provider, MD  GLIPIZIDE XL 2.5 MG 24 hr tablet Take 1 tablet by mouth  daily 02/22/14  Yes Kolyn Rozario S Hameed Kolar, PA-C  Glucosamine-Chondroit-Vit C-Mn (GLUCOSAMINE CHONDR 1500 COMPLX PO) Take 1,500 mg by mouth daily.     Yes Historical Provider, MD  JANUMET 50-1000 MG per tablet Take 1 tablet by mouth  twice a day with meals   Yes Rylinn Linzy S Pamula Luther, PA-C  pramipexole (MIRAPEX) 0.25 MG tablet Take 1 tablet (0.25 mg total) by mouth 3 (three) times daily. 04/19/14  Yes Hulen Luster, DO  simvastatin (ZOCOR) 40 MG tablet Take one-half tablet by  mouth daily 02/22/14  Yes Khyrie Masi S Elexa Kivi, PA-C  vitamin C (ASCORBIC ACID) 500 MG tablet Take 500 mg by mouth daily.     Yes Historical Provider, MD  glucose blood test strip Use as instructed 09/15/13   Rise Mu, PA-C    HPI  Presents for follow-up of DM, HTN and hyperlipidemia. Sees Dr. Janann Colonel soon for follow-up of Parkinson's.  His wife reports increasing symptoms again, so he expects an increase in the Mirapex dose. Has been extra tired the past few weeks. BP is lower than usual. Home glucose 114 this morning.  Just got his monitoring program back up and running. Checks most days at lunch.  Not always in the mornings and evenings. Eye exam scheduled for  next month. Gets his flu vaccine at work.  Review of Systems Denies chest pain, shortness of breath, HA, dizziness, vision change, nausea, vomiting, diarrhea, constipation, melena, hematochezia, dysuria, increased urinary urgency or frequency, increased hunger or thirst, unintentional weight change, unexplained myalgias or arthralgias, rash.     Objective:   Physical Exam  Constitutional: He is oriented to person, place, and time. He appears well-developed and well-nourished. No distress.  BP 100/60  Pulse 88  Temp(Src) 98.5 F (36.9 C) (Oral)  Resp 16  Ht 5\' 9"  (1.753 m)  Wt 219 lb (99.338 kg)  BMI 32.33 kg/m2  SpO2 97%   Eyes: Conjunctivae are normal. No scleral icterus.  Neck: Neck supple. No thyromegaly present.  Cardiovascular: Normal rate, regular rhythm, normal heart sounds and intact distal pulses.   Pulmonary/Chest: Effort normal and breath sounds normal.  Lymphadenopathy:    He has no cervical adenopathy.  Neurological: He is alert and oriented to person, place, and time.  Skin: Skin is warm and dry.  Psychiatric: His speech is normal and behavior is normal. Thought content normal. His mood appears not anxious. His affect is not inappropriate. He does not exhibit a depressed mood.   See diabetic foot exam.    Results for orders placed in visit on 08/04/14  GLUCOSE, POCT (MANUAL RESULT ENTRY)  Result Value Ref Range   POC Glucose 111 (*) 70 - 99 mg/dl  POCT GLYCOSYLATED HEMOGLOBIN (HGB A1C)      Result Value Ref Range   Hemoglobin A1C 6.2    POCT CBC      Result Value Ref Range   WBC 6.4  4.6 - 10.2 K/uL   Lymph, poc 1.8  0.6 - 3.4   POC LYMPH PERCENT 28.2  10 - 50 %L   MID (cbc) 0.4  0 - 0.9   POC MID % 5.6  0 - 12 %M   POC Granulocyte 4.2  2 - 6.9   Granulocyte percent 66.2  37 - 80 %G   RBC 5.20  4.69 - 6.13 M/uL   Hemoglobin 15.8  14.1 - 18.1 g/dL   HCT, POC 48.9  43.5 - 53.7 %   MCV 94.2  80 - 97 fL   MCH, POC 30.5  27 - 31.2 pg   MCHC 32.3   31.8 - 35.4 g/dL   RDW, POC 15.2     Platelet Count, POC 190  142 - 424 K/uL   MPV 8.6  0 - 99.8 fL       Assessment & Plan:  1. Type II or unspecified type diabetes mellitus without mention of complication, not stated as uncontrolled Continue with good control of glucose.  Continue current medications. - POCT glucose (manual entry) - POCT glycosylated hemoglobin (Hb A1C) - Lipid panel - HM Diabetes Eye Exam - HM Diabetes Foot Exam  2. Mixed hyperlipidemia Await labs. Continue current treatment and efforts for lifestyle modification. - Comprehensive metabolic panel  3. Essential hypertension, benign Low today.  Possibly related to addition of Mirapex.  Possibly contributing to his fatigue.  Reduce enalapril to 1/2 tablet.  It's a small one, 2.5 mg, so if it's not scored, he can take it QOD.  4. Other fatigue See #3. Await TSH. - POCT CBC - TSH  Return in about 3 months (around 11/04/2014).  [name], PA-C Physician Assistant-Certified [facility]

## 2014-08-17 ENCOUNTER — Ambulatory Visit: Payer: 59 | Admitting: Physician Assistant

## 2014-08-26 ENCOUNTER — Other Ambulatory Visit: Payer: Self-pay | Admitting: Physician Assistant

## 2014-08-30 ENCOUNTER — Ambulatory Visit (INDEPENDENT_AMBULATORY_CARE_PROVIDER_SITE_OTHER): Payer: 59 | Admitting: Neurology

## 2014-08-30 ENCOUNTER — Encounter: Payer: Self-pay | Admitting: Neurology

## 2014-08-30 VITALS — BP 108/72 | HR 82 | Ht 70.5 in | Wt 226.0 lb

## 2014-08-30 DIAGNOSIS — G20 Parkinson's disease: Secondary | ICD-10-CM

## 2014-08-30 NOTE — Progress Notes (Signed)
[facility]    Provider:  Dr Janann Colonel Referring Provider: [name], PA-C Primary Care Physician:  JEFFERY,CHELLE, PA-C  CC:  tremor  63y/o gentleman with history of PD presenting for follow up visit. His last visit was 04/2014 at which time his Mirapex was increased to 0.25mg  TID. Overall he feels he has gotten slightly worse. Notes increased difficulty with walking, more of a stooped posture, walking slower, walking a little bit slower. Decreased arm swing. Notes generalized fatigue throughout the day, this has improved with cutting back on his blood pressure medication. Sleeping well, wife notes increased talking in his sleep. He is not exercising on a regular basis.  MIrapex is currently 0.25mg  TID. No adverse effects. No ICD behavior.   Initial visit 10/2013: Patient feels he is asymptomatic, his wife notes that the symptoms for list the past 4-6 months. She notes tremor in his hands, masked face, change in his walking. She feels he is in good and bad days, reports is currently doing well. Tremor is predominantly in his right hand, minimal in the left. Appears to be both a rest action and postural tremor. He notes some stiffness of his muscles, generalized bradykinesia, slowing down of his walking. Feels more unsteady walking, has had some falls in the past. He feels her handwriting has gotten messier, no change in size, no micrographia. His wife notes his speech is more hesitant and slow, no hypophonia. He denies any difficulty sleeping, wife notes long history of REM behavior disorder. No constipation, no difficulty swallowing. He has some excessive saliva production. He does note a chronic lack of sense of smell.  No prior history of strokes. Does have DM. No exposure to dopamine blocking agents. No heavy metal exposure. Has well water. No family history of neurodegenerative disorders.   Review of Systems: Out of a complete 14 system review, the patient  complains of only the following symptoms, and all other reviewed systems are negative. Positive for tremor or sleep talking and acting out dreams drooling  History   Social History  . Marital Status: Married    Spouse Name: [name]    Number of Children: 3  . Years of Education: 16   Occupational History  . Engineer    Social History Main Topics  . Smoking status: Former Smoker -- 1.50 packs/day for 15 years    Types: Cigarettes    Quit date: 12/17/1982  . Smokeless tobacco: Never Used     Comment: over 40 yrs  . Alcohol Use: No     Comment: very rare  . Drug Use: No  . Sexual Activity: Yes    Partners: Female   Other Topics Concern  . Not on file   Social History Narrative   [name], motorcycle rider.  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  He has 3 adult children.  His wife had 2 children, one of whom is deceased.   Patient is working full-time.   Patient has a Bachelor's degree.   Patient is right-handed.   Patient drinks 6 cups of coffee daily.    Family History  Problem Relation Age of Onset  . Diabetes Mother   . Diabetes Sister   . Diabetes Daughter   . Cancer Father   . Heart disease Father     Past Medical History  Diagnosis Date  . Type II or unspecified type diabetes mellitus without mention of complication, not stated as uncontrolled   . Mixed hyperlipidemia   . OA (osteoarthritis)  of knee     right  . Essential hypertension, benign   . Obesity, unspecified   . Cataract   . Parkinsons     Past Surgical History  Procedure Laterality Date  . Knee arthroscopy      right  . Ganglion cyst excision  age 63 years    right  . Wrist surgery  04/2012    Current Outpatient Prescriptions  Medication Sig Dispense Refill  . aspirin 81 MG tablet Take 81 mg by mouth daily.        . enalapril (VASOTEC) 2.5 MG tablet 2.5 mg. Take 1/2 tablet by mouth  daily      . Ginger, Zingiber officinalis, 550 MG CAPS Take 550 mg by mouth daily.        Marland Kitchen GLIPIZIDE XL  2.5 MG 24 hr tablet Take 1 tablet by mouth  daily  90 tablet  1  . Glucosamine-Chondroit-Vit C-Mn (GLUCOSAMINE CHONDR 1500 COMPLX PO) Take 1,500 mg by mouth daily.        Marland Kitchen glucose blood test strip Use as instructed  300 each  4  . JANUMET 50-1000 MG per tablet Take 1 tablet by mouth  twice a day with meals  180 tablet  0  . pramipexole (MIRAPEX) 0.25 MG tablet Take 1 tablet (0.25 mg total) by mouth 3 (three) times daily.  90 tablet  6  . simvastatin (ZOCOR) 40 MG tablet Take one-half tablet by  mouth daily  45 tablet  1  . vitamin C (ASCORBIC ACID) 500 MG tablet Take 500 mg by mouth daily.         No current facility-administered medications for this visit.    Allergies as of 08/30/2014 - Review Complete 08/30/2014  Allergen Reaction Noted  . Codeine Nausea And Vomiting 10/09/2011    Vitals: BP 108/72  Pulse 82  Ht 5' 10.5" (1.791 m)  Wt 226 lb (102.513 kg)  BMI 31.96 kg/m2 Last Weight:  Wt Readings from Last 1 Encounters:  08/30/14 226 lb (102.513 kg)   Last Height:   Ht Readings from Last 1 Encounters:  08/30/14 5' 10.5" (1.791 m)     Physical exam: Exam: Gen: NAD, conversant, decreased blink reflex, masked facies Eyes: anicteric sclerae, moist conjunctivae HENT: Atraumatic, oropharynx clear Neck: Trachea midline; supple,  Lungs: CTA, no wheezing, rales, rhonic                          CV: RRR, no MRG Abdomen: Soft, non-tender;  Extremities: No peripheral edema  Skin: Normal temperature, no rash,  Psych: Appropriate affect, pleasant  Neuro: MS: AA&Ox3, appropriately interactive, normal affect   Speech: fluent w/o paraphasic error  Memory: good recent and remote recall  CN: PERRL, EOMI no nystagmus, no ptosis, sensation intact to LT V1-V3 bilat, face symmetric, no weakness, hearing grossly intact, palate elevates symmetrically, shoulder shrug 5/5 bilat,  tongue protrudes midline, no fasiculations noted.  Motor: normal bulk and tone Strength: 5/5  In all  extremities  Reflexes: symmetrical, bilat downgoing toes  Sens: LT intact in all extremities  Brief Motor UPDRS  Speech: wnl  Facial Expression: mild masked facies, decreased blink rate  Tremor: Rest R 1 L 0 Action/postural R 1 L 0 Rigidity: Increased tone/cogwheel rigidity bilat UE R>L Finger taps:   R:1 L:1  Open/close hands: R:1 L:1  Foot taps: R:1 L:1  Gait/FOG: Arises from chair without pushing off, upright, minimal shuffling, decreased arm swing bilat R>L,  negative Romberg, normal Pull test    Assessment:  After physical and neurologic examination, review of laboratory studies, imaging, neurophysiology testing and pre-existing records, assessment will be reviewed on the problem list.  Plan:  Treatment plan and additional workup will be reviewed under Problem List.  1)Parkinsonism 2)RBD 3)Sialorrhea  63y/o gentleman presenting for follow up evaluation of tremor, generalized bradykinesia which is consistent with a diagnosis of a parkinsonism. Based on good response to a DA, suspect this likely represents idiopathic PD. Patient feels there has been a slight worsening in his overall function since last visit. Notes increased difficulty walking. Continues on Mirapex 0.25mg  TID, tolerating well with no adverse effects. Discussed importance of a regular exercise regimen with patient. Will hold on increasing Mirapex at this time. Will refer for PT. Patient will aim for 30 minutes of exercise 5x a week. Follow up in 4 months with Dr Rexene Alberts.

## 2014-08-30 NOTE — Patient Instructions (Signed)
Overall you are doing fairly well but I do want to suggest a few things today:   Remember to drink plenty of fluid, eat healthy meals and do not skip any meals. Try to eat protein with a every meal and eat a healthy snack such as fruit or nuts in between meals. Try to keep a regular sleep-wake schedule and try to exercise daily, particularly in the form of walking, 20-30 minutes a day, if you can.   As far as your medications are concerned, I would like to suggest not making any changes to your Mirapex at this time  I would like you to work with physical therapy. You will be called to schedule this.   Please exercise on a regular basis. Aim for 30 minutes 5 times a week. Mix in cardiovascular, stretching and light resistance training.   Follow up in 4 months with Dr Rexene Alberts. Please call us with any interim questions, concerns, problems, updates or refill requests.   My clinical assistant and will answer any of your questions and relay your messages to me and also relay most of my messages to you.   Our phone number is [phone]. We also have an after hours call service for urgent matters and there is a physician on-call for urgent questions. For any emergencies you know to call 911 or go to the nearest emergency room

## 2014-10-02 ENCOUNTER — Encounter: Payer: Self-pay | Admitting: Physician Assistant

## 2014-11-01 ENCOUNTER — Encounter: Payer: Self-pay | Admitting: Physical Therapy

## 2014-11-01 ENCOUNTER — Ambulatory Visit: Payer: 59 | Attending: Neurology | Admitting: Physical Therapy

## 2014-11-01 DIAGNOSIS — Z5189 Encounter for other specified aftercare: Secondary | ICD-10-CM | POA: Diagnosis present

## 2014-11-01 DIAGNOSIS — G20 Parkinson's disease: Secondary | ICD-10-CM | POA: Diagnosis not present

## 2014-11-01 DIAGNOSIS — R269 Unspecified abnormalities of gait and mobility: Secondary | ICD-10-CM

## 2014-11-01 NOTE — Therapy (Signed)
Physical Therapy Evaluation  Patient Details  Name: Danny [name] MRN: [id number] Date of Birth: 10-31-1951  Encounter Date: 11/01/2014      PT End of Session - 11/01/14 0857    Visit Number 1   Number of Visits 9   Date for PT Re-Evaluation 01/01/14   PT Start Time 0805   PT Stop Time 0848   PT Time Calculation (min) 43 min      Past Medical History  Diagnosis Date  . Type II or unspecified type diabetes mellitus without mention of complication, not stated as uncontrolled   . Mixed hyperlipidemia   . OA (osteoarthritis) of knee     right  . Essential hypertension, benign   . Obesity, unspecified   . Cataract   . Parkinsons     Past Surgical History  Procedure Laterality Date  . Knee arthroscopy      right  . Ganglion cyst excision  age 56 years    right  . Wrist surgery  04/2012    There were no vitals taken for this visit.  Visit Diagnosis:  Abnormality of gait - Plan: PT plan of care cert/re-cert      Subjective Assessment - 11/01/14 0808    Symptoms Pt is a 63 year old male who presents to PT due to walking difficulties and low energy.  Pt and wife report slow walking, dragging feet with gait.  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  Pt reports forward flexed posture, with symptoms that have gone on for about a year.   Pertinent History Parkinson's diagnosis November 2014 per wife report   Patient Stated Goals to improve walking, awareness of difficulties, improved energy   Currently in Pain? No/denies          [facility] PT Assessment - 11/01/14 8185    Assessment   Medical Diagnosis Parkinson's disease    Onset Date --  10/2013   Next MD Visit to follow up with Dr. Rexene Alberts in January 2016   Precautions   Precautions Fall   Restrictions   Weight Bearing Restrictions No   Other Position/Activity Restrictions none   Balance Screen   Has the patient fallen in the past 6 months No   Has the patient had a decrease in activity  level because of a fear of falling?  No   Is the patient reluctant to leave their home because of a fear of falling?  No   Home Environment   Living Enviornment Private residence   Living Arrangements Spouse/significant other   Available Help at Discharge Family   Type of [place] to enter   Entrance Stairs-Number of Steps [place] One level   Prior [place] Full time employment   International aid/development worker, field work, including stairs and measuring   Leisure Square dancing once/week   Posture/Postural Control   Posture/Postural Control Postural limitations   Postural Limitations Rounded Shoulders;Forward head   Transfers   Transfers Sit to Stand;Stand to Sit   Sit to Stand 7: Independent  5x sit<>stand 15.92 seconds   Stand to Sit 7: Independent  reports difficulty with low surface transfers   Ambulation/Gait   Ambulation/Gait Yes   Gait velocity 9.62 sec=3.41 ft/sec   Standardized Balance Assessment   Standardized Balance Assessment Timed Up and Go Test   Timed Up and Go Test   TUG Normal  TUG;Manual TUG;Cognitive TUG   Normal TUG (seconds) 12.63   Manual TUG (seconds) 12.79   Cognitive TUG (seconds) 12.62   Functional Gait  Assessment   Gait assessed  Yes   Gait Level Surface Walks 20 ft in less than 7 sec but greater than 5.5 sec, uses assistive device, slower speed, mild gait deviations, or deviates 6-10 in outside of the 12 in walkway width.   Change in Gait Speed Able to change speed, demonstrates mild gait deviations, deviates 6-10 in outside of the 12 in walkway width, or no gait deviations, unable to achieve a major change in velocity, or uses a change in velocity, or uses an assistive device.  no arm swing   Gait with Horizontal Head Turns Performs head turns smoothly with slight change in gait velocity (eg, minor disruption to smooth gait path), deviates 6-10 in outside 12 in  walkway width, or uses an assistive device.   Gait with Vertical Head Turns Performs task with slight change in gait velocity (eg, minor disruption to smooth gait path), deviates 6 - 10 in outside 12 in walkway width or uses assistive device   Gait and Pivot Turn Pivot turns safely in greater than 3 sec and stops with no loss of balance, or pivot turns safely within 3 sec and stops with mild imbalance, requires small steps to catch balance.  slight posterior lean   Step Over Obstacle Is able to step over one shoe box (4.5 in total height) but must slow down and adjust steps to clear box safely. May require verbal cueing.   Gait with Narrow Base of Support Ambulates less than 4 steps heel to toe or cannot perform without assistance.   Gait with Eyes Closed Walks 20 ft, slow speed, abnormal gait pattern, evidence for imbalance, deviates 10-15 in outside 12 in walkway width. Requires more than 9 sec to ambulate 20 ft.  veers to L   Ambulating Backwards Walks 20 ft, no assistive devices, good speed, no evidence for imbalance, normal gait   Steps Alternating feet, must use rail.   Total Score 17            PT Education - 11/01/14 0855    Education provided Yes   Education Details Discussed options for speech therapy due to voice concerns, new onset double  vision at night (recommended pt contact neurologist), Power over Parkinsons's community group   Person(s) Educated Patient;Spouse   Methods Explanation;Handout   Comprehension Verbalized understanding            PT Long Term Goals - 11/01/14 0903    PT LONG TERM GOAL #1   Title Pt will be independent with HEP to address Parkinson's related deficits.   Time 4   Period Weeks   Status New   PT LONG TERM GOAL #2   Title Pt will improve Functional Gait Assessment to at least 20/24 for decreased fall risk.   Time 4   Period Weeks   Status New   PT LONG TERM GOAL #3   Title Pt will perform 5x sit<>stand transfers in less than or  equal to 11.5 seconds for improved transfer efficiency and safety.   Time 4   Period Weeks   Status New   PT LONG TERM GOAL #4   Title Pt will verbalize understanding of local Parkinson's disease-related resources.   Time 4   Period Weeks   Status New   PT LONG TERM GOAL #5   Title Pt will verbalize  plans for ongoing community fitness upon D/C from PT.   Time 4   Period Weeks   Status New          Plan - 11/01/14 0859    Clinical Impression Statement Pt is a 63 year old male who presents to OP PT with Parkinson's disease, with changes noted over the past year in his walking, mobility, fatigue level.  Pt reports no falls in the past 6 months.  He notices dificulty with transfers and slowed mobility.    Pt will benefit from skilled therapeutic intervention in order to improve on the following deficits Abnormal gait;Decreased balance;Decreased endurance;Decreased mobility;Difficulty walking;Postural dysfunction  bradykinesia, tremors, decreased timing and coordination with gait   Rehab Potential Good   PT Frequency 2x / week   PT Duration 4 weeks   PT Treatment/Interventions Gait training;Functional mobility training;Neuromuscular re-education;Balance training;Therapeutic exercise;Therapeutic activities;Patient/family education   PT Next Visit Plan Initiate [facility]! Moves in sitting and standing; gait activities with large amplitude focus; sit<>stand transfers from lower surfaces   Consulted and Agree with Plan of Care Patient;Family member/caregiver        Problem List Patient Active Problem List   Diagnosis Date Noted  . Parkinson disease 01/14/2014  . Obesity (BMI 30-39.9) 07/14/2013  . Confusional arousals 02/13/2013  . Disorder of ligament of right wrist 01/29/2012  . Type II or unspecified type diabetes mellitus without mention of complication, not stated as uncontrolled 10/09/2011  . Mixed hyperlipidemia 10/09/2011  . OA (osteoarthritis) of knee 10/09/2011  . Essential  hypertension, benign 10/09/2011                                              Yadir Zentner W. 11/01/2014, 9:14 AM

## 2014-11-17 ENCOUNTER — Ambulatory Visit: Payer: 59 | Attending: Neurology | Admitting: Physical Therapy

## 2014-11-17 ENCOUNTER — Encounter: Payer: Self-pay | Admitting: Physical Therapy

## 2014-11-17 DIAGNOSIS — G20 Parkinson's disease: Secondary | ICD-10-CM | POA: Diagnosis not present

## 2014-11-17 DIAGNOSIS — Z5189 Encounter for other specified aftercare: Secondary | ICD-10-CM | POA: Insufficient documentation

## 2014-11-17 DIAGNOSIS — R269 Unspecified abnormalities of gait and mobility: Secondary | ICD-10-CM

## 2014-11-17 NOTE — Patient Instructions (Addendum)
Sit to Stand Transfers:  1. Scoot out to the edge of the chair 2. Place your feet flat on the floor, shoulder width apart.  Make sure your feet are tucked just under your knees. 3. Lean forward (nose over toes) with momentum, and stand up tall with your best posture.  If you need to use your arms, use them as a quick boost up to stand. 4. If you are in a low or soft chair, you can lean back and then forward up to stand, in order to get more momentum. 5. Once you are standing, make sure you are looking ahead and standing tall.  To sit down:  1. Back up until you feel the chair behind your legs. 2. Bend at you hips, reaching  Back for you chair, if needed, then slowly squat to sit down on your chair.   [facility] ([facility] Moves exercises provided in sitting and in standing, 20 reps each, once per day.

## 2014-11-17 NOTE — Therapy (Signed)
[facility] [address] [place], [place], [address] Phone: [phone]   Fax:  [phone]  Physical Therapy Treatment  Patient Details  Name: Danny [name] MRN: [id number] Date of Birth: 27-Dec-1950  Encounter Date: 11/17/2014      PT End of Session - 11/17/14 0843    Visit Number 2   Number of Visits 9   Date for PT Re-Evaluation 01/01/14   PT Start Time 0802   PT Stop Time 0842   PT Time Calculation (min) 40 min   Activity Tolerance Patient tolerated treatment well      Past Medical History  Diagnosis Date  . Type II or unspecified type diabetes mellitus without mention of complication, not stated as uncontrolled   . Mixed hyperlipidemia   . OA (osteoarthritis) of knee     right  . Essential hypertension, benign   . Obesity, unspecified   . Cataract   . Parkinsons     Past Surgical History  Procedure Laterality Date  . Knee arthroscopy      right  . Ganglion cyst excision  age 63 years    right  . Wrist surgery  04/2012    There were no vitals taken for this visit.  Visit Diagnosis:  Abnormality of gait      Subjective Assessment - 11/17/14 0803    Symptoms Pt reports having good trip to [place] and [place] over the past 2 weeks.  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  He reports bruising his shin.   Currently in Pain? No/denies            [facility] Adult PT Treatment/Exercise - 11/17/14 1253    Transfers   Transfers Sit to Stand;Stand to Sit  Provided cues for technique-large amplitdue movements   Sit to Stand 7: Independent;From elevated surface;Without upper extremity assist;From chair/3-in-1  from <18 in compliant seat x 5 reps each   Stand to Sit 7: Independent;Without upper extremity assist;To elevated surface;To bed  to <18 inch compliant seat   Ambulation/Gait   Ambulation/Gait Yes   Ambulation/Gait Assistance 5: Supervision   Ambulation/Gait  Assistance Details cues provided for increased foot clearance, heelstrike and arm swing.  Cues given for overall large amplitude, deliberate movement patterns.   Ambulation Distance (Feet) 400 Feet   Assistive device None   Gait Pattern Decreased step length - right;Decreased step length - left;Decreased dorsiflexion - right;Decreased dorsiflexion - left;Poor foot clearance - left;Poor foot clearance - right  heels strike the floor "squeaking" with gait          PT Education - 11/17/14 0843    Education provided Yes   Education Details Initiated HEP for seated and standing PWR! Moves, sit<>stand technique   Person(s) Educated Patient   Methods Explanation;Handout   Comprehension Verbalized understanding;Returned demonstration            PT Long Term Goals - 11/17/14 1258    PT LONG TERM GOAL #1   Title Pt will be independent with HEP to address Parkinson's related deficits.   Status On-going   PT LONG TERM GOAL #2   Title Pt will improve Functional Gait Assessment to at least 20/24 for decreased fall risk.   Status On-going   PT LONG TERM GOAL #3   Title Pt will perform 5x sit<>stand transfers in less than or equal to 11.5 seconds for improved transfer efficiency and safety.   Status On-going  PT LONG TERM GOAL #4   Title Pt will verbalize understanding of local Parkinson's disease-related resources.   Status On-going   PT LONG TERM GOAL #5   Title Pt will verbalize plans for ongoing community fitness upon D/C from PT.   Status On-going          Plan - 11/17/14 0844    Clinical Impression Statement HEP initiated for this patient today for PWR! Moves in sitting and standing to address posture, weightshifting, trunk flexibility, and transition step.  Pt requires verbal cues for large amplitude, deliberate movement patterns, especially with gait, as pt has decreased foot clearance and decreased armswing, even with verbal cues for large, dleiberate movements   Pt will  benefit from skilled therapeutic intervention in order to improve on the following deficits Abnormal gait;Decreased balance;Decreased endurance;Decreased mobility;Difficulty walking;Postural dysfunction   Rehab Potential Good   PT Frequency 2x / week   PT Duration 4 weeks   PT Treatment/Interventions Gait training;Functional mobility training;Neuromuscular re-education;Balance training;Therapeutic exercise;Therapeutic activities;Patient/family education   PT Next Visit Plan Review HEP, review transfers and progress to lower surfaces; gait training-try treadmill for improved foot clearance                       [facility]) - 11/17/14 0811    PWR! exercises Moves in sitting;Moves in standing   PWR! Up 20    PWR! Rock 20   PWR! Twist 20   PWR Step 20   Comments Verbal and visual cues for initial instruction   PWR! Up 20   PWR! Rock 20   PWR! Twist 20   PWR! Step 20   Comments visual and verbal cues for initial instruction                 Problem List Patient Active Problem List   Diagnosis Date Noted  . Parkinson disease 01/14/2014  . Obesity (BMI 30-39.9) 07/14/2013  . Confusional arousals 02/13/2013  . Disorder of ligament of right wrist 01/29/2012  . Type II or unspecified type diabetes mellitus without mention of complication, not stated as uncontrolled 10/09/2011  . Mixed hyperlipidemia 10/09/2011  . OA (osteoarthritis) of knee 10/09/2011  . Essential hypertension, benign 10/09/2011    Cledith Abdou W. 11/17/2014, 1:02 PM     [place], PT 11/17/2014 1:02 PM Phone: [phone] Fax: [phone]

## 2014-11-19 ENCOUNTER — Ambulatory Visit: Payer: 59 | Admitting: Physical Therapy

## 2014-11-19 ENCOUNTER — Encounter: Payer: Self-pay | Admitting: Physical Therapy

## 2014-11-19 DIAGNOSIS — R269 Unspecified abnormalities of gait and mobility: Secondary | ICD-10-CM

## 2014-11-19 DIAGNOSIS — Z5189 Encounter for other specified aftercare: Secondary | ICD-10-CM | POA: Diagnosis not present

## 2014-11-19 NOTE — Therapy (Signed)
[facility] [address] [place], [place], [address] Phone: [phone]   Fax:  [phone]  Physical Therapy Treatment  Patient Details  Name: Danny [name] MRN: [id number] Date of Birth: 1951/02/24  Encounter Date: 11/19/2014      PT End of Session - 11/19/14 1134    Visit Number 3   Number of Visits 9   Date for PT Re-Evaluation 01/01/14   PT Start Time 0805   PT Stop Time 0846   PT Time Calculation (min) 41 min   Equipment Utilized During Treatment Gait belt   Activity Tolerance Patient tolerated treatment well      Past Medical History  Diagnosis Date  . Type II or unspecified type diabetes mellitus without mention of complication, not stated as uncontrolled   . Mixed hyperlipidemia   . OA (osteoarthritis) of knee     right  . Essential hypertension, benign   . Obesity, unspecified   . Cataract   . Parkinsons     Past Surgical History  Procedure Laterality Date  . Knee arthroscopy      right  . Ganglion cyst excision  age 40 years    right  . Wrist surgery  04/2012    There were no vitals taken for this visit.  Visit Diagnosis:  Abnormality of gait      Subjective Assessment - 11/19/14 1132    Symptoms Tried the exercises-went well; had to find a good chair for them   Currently in Pain? No/denies     NeuroRe-education: Reviewd PWR! Moves in sitting and standign 20 reps each:  Pt return demo understanding.  Verbally reviewed cueing/importance of large amplitude/deliberate movements.  Performed forward step and weightshift x 10, backward step and weightshift x 10 (with UE support) with cues for improved amplitude for stepping and for balance training.  (Added to HEP)  Gait:  Treadmill gait training 1.6 mph with bilateral UE support, with 1:30 at 0.8 mph, kicking large therapy ball, 6 minutes on treadmill total, with cues for large amplitude increased step length and foot clearance.  Over ground gait x  800 ft, use of walking poles to facilitate arm swing.         PT Education - 11/19/14 1134    Education provided Yes   Education Details Reviewed HEP; added forward/back step and weightshift to HEP   Person(s) Educated Patient   Methods Demonstration;Explanation;Handout   Comprehension Verbalized understanding;Returned demonstration            PT Long Term Goals - 11/19/14 1136    PT LONG TERM GOAL #1   Title Pt will be independent with HEP to address Parkinson's related deficits.   PT LONG TERM GOAL #2   Title Pt will improve Functional Gait Assessment to at least 20/24 for decreased fall risk.   Status On-going   PT LONG TERM GOAL #3   Title Pt will perform 5x sit<>stand transfers in less than or equal to 11.5 seconds for improved transfer efficiency and safety.   Status On-going   PT LONG TERM GOAL #4   Title Pt will verbalize understanding of local Parkinson's disease-related resources.   Status On-going   PT LONG TERM GOAL #5   Title Pt will verbalize plans for ongoing community fitness upon D/C from PT.   Status On-going          Plan - 11/19/14 1134    Clinical Impression Statement Use of treadmill with kicking therapy ball noted to  improve overall foot clearance and heelstrike on treadmill and overground gait.   Pt will benefit from skilled therapeutic intervention in order to improve on the following deficits Abnormal gait;Decreased balance;Decreased endurance;Decreased mobility;Difficulty walking;Postural dysfunction   Rehab Potential Good   PT Frequency 2x / week   PT Duration 4 weeks   PT Treatment/Interventions Gait training;Functional mobility training;Neuromuscular re-education;Balance training;Therapeutic exercise;Therapeutic activities;Patient/family education   PT Next Visit Plan Review additions to HEP, progress transfers to lower surfaces; gait training with treadmill for improved foot clearance    Consulted and Agree with Plan of Care Patient                                Problem List Patient Active Problem List   Diagnosis Date Noted  . Parkinson disease 01/14/2014  . Obesity (BMI 30-39.9) 07/14/2013  . Confusional arousals 02/13/2013  . Disorder of ligament of right wrist 01/29/2012  . Type II or unspecified type diabetes mellitus without mention of complication, not stated as uncontrolled 10/09/2011  . Mixed hyperlipidemia 10/09/2011  . OA (osteoarthritis) of knee 10/09/2011  . Essential hypertension, benign 10/09/2011    Amylia Collazos W. 11/19/2014, 11:37 AM   Mady Haagensen, PT 11/19/2014 11:41 AM Phone: [phone] Fax: [phone]

## 2014-11-19 NOTE — Patient Instructions (Signed)
Provided patient with LSVT BIG Forward step and reach x 10 reps, backward step and reach x 10 reps (cues to hold counter for backwards)

## 2014-11-20 ENCOUNTER — Encounter: Payer: Self-pay | Admitting: Physician Assistant

## 2014-11-24 ENCOUNTER — Ambulatory Visit: Payer: 59 | Admitting: Physical Therapy

## 2014-11-24 ENCOUNTER — Encounter: Payer: Self-pay | Admitting: Physical Therapy

## 2014-11-24 DIAGNOSIS — Z5189 Encounter for other specified aftercare: Secondary | ICD-10-CM | POA: Diagnosis not present

## 2014-11-24 DIAGNOSIS — R269 Unspecified abnormalities of gait and mobility: Secondary | ICD-10-CM

## 2014-11-24 NOTE — Patient Instructions (Addendum)
WALKING  Walking is a great form of exercise to increase your strength, endurance and overall fitness.  A walking program can help you start slowly and gradually build endurance as you go.  Everyone's ability is different, so each person's starting point will be different.  You do not have to follow them exactly.  The are just samples. You should simply find out what's right for you and stick to that program.   In the beginning, you'll start off walking 2-3 times a day for short distances.  As you get stronger, you'll be walking further at just 1-2 times per day.  A. You Can Walk For A Certain Length Of Time Each Day    Walk 5 minutes 3 times per day.  Increase 2 minutes every 2 days (3 times per day).  Work up to 25-30 minutes (1-2 times per day).   Example:   Day 1-2 5 minutes 3 times per day   Day 7-8 12 minutes 2-3 times per day   Day 13-14 25 minutes 1-2 times per day  B. You Can Walk For a Certain Distance Each Day     Distance can be substituted for time.    Example:   3 trips to mailbox (at road)   3 trips to corner of block   3 trips around the block  Make sure to use your best posture, arm swing and long strides with walking

## 2014-11-24 NOTE — Therapy (Signed)
[facility] [address] [place], [place], [address] Phone: [phone]   Fax:  [phone]  Physical Therapy Treatment  Patient Details  Name: Danny [name] MRN: [id number] Date of Birth: 1951-05-18  Encounter Date: 11/24/2014      PT End of Session - 11/24/14 0853    Visit Number 4   Number of Visits 9   Date for PT Re-Evaluation 01/01/14   PT Start Time 0803   PT Stop Time 0843   PT Time Calculation (min) 40 min   Equipment Utilized During Treatment Gait belt   Activity Tolerance Patient tolerated treatment well      Past Medical History  Diagnosis Date  . Type II or unspecified type diabetes mellitus without mention of complication, not stated as uncontrolled   . Mixed hyperlipidemia   . OA (osteoarthritis) of knee     right  . Essential hypertension, benign   . Obesity, unspecified   . Cataract   . Parkinsons     Past Surgical History  Procedure Laterality Date  . Knee arthroscopy      right  . Ganglion cyst excision  age 56 years    right  . Wrist surgery  04/2012    There were no vitals taken for this visit.  Visit Diagnosis:  Abnormality of gait      Subjective Assessment - 11/24/14 0805    Symptoms Had a busy weekend, doing exercises every day.   Currently in Pain? No/denies            [facility] Adult PT Treatment/Exercise - 11/24/14 0824    Ambulation/Gait   Ambulation/Gait Yes   Ambulation/Gait Assistance 7: Independent   Ambulation/Gait Assistance Details cues provided for overall best posture, arm swing, foot clearance, large amplitude movement pattern   Ambulation Distance (Feet) 700 Feet  ft, then 400 ft. using walking poles for incr. arm swing   Assistive device None   Gait Pattern --  occasional decr. foot clearance bilaterally   High Level Balance   High Level Balance Activities Backward walking  forward back walking for weight shift transitions   Exercises   Exercises Knee/Hip    Knee/Hip Exercises: Aerobic   Tread Mill 1.6 mph with 4 extremities, x 7 minutes total, with 1 minute at 0.8 mph for large ampitude kicking to therapy balll  bilat UE support, cues for posture, step length          PT Education - 11/24/14 0852    Education provided Yes   Education Details Reviewed HEP; provided information on walking program and [employer] [facility] Parkinson's community group   Person(s) Educated Patient   Methods Explanation;Demonstration;Handout   Comprehension Verbalized understanding;Returned demonstration            PT Long Term Goals - 11/24/14 0855    PT LONG TERM GOAL #1   Title Pt will be independent with HEP to address Parkinson's related deficits.   Status On-going   PT LONG TERM GOAL #2   Title Pt will improve Functional Gait Assessment to at least 20/24 for decreased fall risk.   Status On-going   PT LONG TERM GOAL #3   Title Pt will perform 5x sit<>stand transfers in less than or equal to 11.5 seconds for improved transfer efficiency and safety.   Status On-going   PT LONG TERM GOAL #4   Title Pt will verbalize understanding of local Parkinson's disease-related resources.   Status On-going   PT LONG TERM GOAL #5  Title Pt will verbalize plans for ongoing community fitness upon D/C from PT.   Status On-going          Plan - 11/24/14 4388    Clinical Impression Statement Pt noted to have improved overall stride length, foot clearance, and arm swing during gait activities this session.  Pt does need frequent cues for upright posture during gait activities.   Pt will benefit from skilled therapeutic intervention in order to improve on the following deficits Abnormal gait;Decreased balance;Decreased endurance;Decreased mobility;Difficulty walking;Postural dysfunction   Rehab Potential Good   PT Frequency 2x / week   PT Duration 4 weeks   PT Treatment/Interventions Gait training;Functional mobility training;Neuromuscular re-education;Balance  training;Therapeutic exercise;Therapeutic activities;Patient/family education   PT Next Visit Plan lower surface transfers, treadmill gait training with added cognitive tasks, gait training with walking poles   Consulted and Agree with Plan of Care Patient                      [facility]) - 11/24/14 8757    PWR! exercises Moves in standing   PWR! Up 20   PWR! Rock 20   PWR! Twist 20   PWR Step 20  side, forward, back directions, cues for added UE moves   Comments cues for posture, deliberate, large amplitude movements                 Problem List Patient Active Problem List   Diagnosis Date Noted  . Parkinson disease 01/14/2014  . Obesity (BMI 30-39.9) 07/14/2013  . Confusional arousals 02/13/2013  . Disorder of ligament of right wrist 01/29/2012  . Type II or unspecified type diabetes mellitus without mention of complication, not stated as uncontrolled 10/09/2011  . Mixed hyperlipidemia 10/09/2011  . OA (osteoarthritis) of knee 10/09/2011  . Essential hypertension, benign 10/09/2011    Kacy Hegna W. 11/24/2014, 9:00 AM     Mady Haagensen, PT 11/24/2014 9:01 AM Phone: [phone] Fax: [phone]

## 2014-11-26 ENCOUNTER — Encounter: Payer: Self-pay | Admitting: Physical Therapy

## 2014-11-26 ENCOUNTER — Ambulatory Visit: Payer: 59 | Admitting: Physical Therapy

## 2014-11-26 DIAGNOSIS — Z5189 Encounter for other specified aftercare: Secondary | ICD-10-CM | POA: Diagnosis not present

## 2014-11-26 DIAGNOSIS — R269 Unspecified abnormalities of gait and mobility: Secondary | ICD-10-CM

## 2014-11-26 NOTE — Therapy (Signed)
[facility] [address] [place], [place], [address] Phone: [phone]   Fax:  [phone]  Physical Therapy Treatment  Patient Details  Name: Danny [name] MRN: [id number] Date of Birth: [birth year]  Encounter Date: 11/26/2014      PT End of Session - 11/26/14 0844    Visit Number 5   Number of Visits 9   Date for PT Re-Evaluation 01/01/14   PT Start Time 0802   PT Stop Time 0843   PT Time Calculation (min) 41 min   Activity Tolerance Patient tolerated treatment well      Past Medical History  Diagnosis Date  . Type II or unspecified type diabetes mellitus without mention of complication, not stated as uncontrolled   . Mixed hyperlipidemia   . OA (osteoarthritis) of knee     right  . Essential hypertension, benign   . Obesity, unspecified   . Cataract   . Parkinsons     Past Surgical History  Procedure Laterality Date  . Knee arthroscopy      right  . Ganglion cyst excision  age 57 years    right  . Wrist surgery  04/2012    There were no vitals taken for this visit.  Visit Diagnosis:  Abnormality of gait      Subjective Assessment - 11/26/14 0803    Symptoms Having a good week; exercises going well; trying to do a bit more walking while at work this week   Currently in Pain? No/denies            [facility] Adult PT Treatment/Exercise - 11/26/14 0804    Ambulation/Gait   Ambulation/Gait Yes   Ambulation/Gait Assistance 7: Independent   Ambulation/Gait Assistance Details cues provided for increased arm swing, stride length  wtih added cognitive activities   Ambulation Distance (Feet) 1000 Feet  indoor surfaces   Assistive device None   Gait Pattern Decreased step length - right;Decreased step length - left;Decreased dorsiflexion - right;Decreased dorsiflexion - left;Poor foot clearance - left;Poor foot clearance - right  noted more with added cognitive task   High Level Balance   High Level Balance  Activities Other (comment)   High Level Balance Comments step up-up/down-down forward and side at 6 inch aerobic step, x 10 reps each with intermittent UE support and cues to look ahead   Knee/Hip Exercises: Aerobic   Tread Mill 1.6 mph with 4 extremities, x 10 minutes total, with 1 minute at 0.8 mph for large ampitude kicking to therapy balll    Cognitive tasks and head turn activities added during treadmill gait, with pt requiring additional cues for increased foot clearance.      PT Education - 11/26/14 0844    Education Details Added PWR! Flow to exercises at home   Person(s) Educated Patient   Methods Explanation;Demonstration;Handout   Comprehension Returned demonstration;Verbalized understanding            PT Long Term Goals - 11/26/14 0847    PT LONG TERM GOAL #1   Title Pt will be independent with HEP to address Parkinson's related deficits.   Status On-going   PT LONG TERM GOAL #2   Title Pt will improve Functional Gait Assessment to at least 20/24 for decreased fall risk.   Status On-going   PT LONG TERM GOAL #3   Title Pt will perform 5x sit<>stand transfers in less than or equal to 11.5 seconds for improved transfer efficiency and safety.   Status On-going  PT LONG TERM GOAL #4   Title Pt will verbalize understanding of local Parkinson's disease-related resources.   Status On-going   PT LONG TERM GOAL #5   Title Pt will verbalize plans for ongoing community fitness upon D/C from PT.   Status On-going          Plan - 11/26/14 0845    Clinical Impression Statement Pt perform standing large amplitude movements with added cognitive activities with occasional slowing and occasional decreased foot clearance.   Pt will benefit from skilled therapeutic intervention in order to improve on the following deficits Abnormal gait;Decreased balance;Decreased endurance;Decreased mobility;Difficulty walking;Postural dysfunction   Rehab Potential Good   PT Frequency 2x /  week   PT Duration 4 weeks  this is week 2 of 4   PT Treatment/Interventions Gait training;Functional mobility training;Neuromuscular re-education;Balance training;Therapeutic exercise;Therapeutic activities;Patient/family education   PT Next Visit Plan lower surface transfers, standing PWR! Flow and varied direction stepping; try gait with walking poles   Consulted and Agree with Plan of Care Patient                      [facility]) - 11/26/14 5498    PWR! exercises Moves in standing   PWR Step 10  reps forward then back with coordinated arms   Basic 4 Flow 5 reps  visual and verbal cues   Comments varied direction stepping and weighsthifting for added cognitive task with large amplitude movements                 Problem List Patient Active Problem List   Diagnosis Date Noted  . Parkinson disease 01/14/2014  . Obesity (BMI 30-39.9) 07/14/2013  . Confusional arousals 02/13/2013  . Disorder of ligament of right wrist 01/29/2012  . Type II or unspecified type diabetes mellitus without mention of complication, not stated as uncontrolled 10/09/2011  . Mixed hyperlipidemia 10/09/2011  . OA (osteoarthritis) of knee 10/09/2011  . Essential hypertension, benign 10/09/2011    Brolin Dambrosia W. 11/26/2014, 8:48 AM   Mady Haagensen, PT 11/26/2014 8:50 AM Phone: [phone] Fax: [phone]

## 2014-11-26 NOTE — Patient Instructions (Signed)
-  Perform PWR! Moves FLOW in standing, "FLOWING" from PWR! Up to [facility]! Rock to [employer]! Twist to PWR! Step -10 reps of the FLOW exercise

## 2014-11-29 ENCOUNTER — Ambulatory Visit: Payer: 59 | Admitting: Physical Therapy

## 2014-11-29 DIAGNOSIS — R269 Unspecified abnormalities of gait and mobility: Secondary | ICD-10-CM

## 2014-11-29 DIAGNOSIS — Z5189 Encounter for other specified aftercare: Secondary | ICD-10-CM | POA: Diagnosis not present

## 2014-11-29 NOTE — Therapy (Signed)
[facility] [address] [place], [place], [address] Phone: [phone]   Fax:  [phone]  Physical Therapy Treatment  Patient Details  Name: KENNEITH STIEF MRN: [id number] Date of Birth: 10/23/1951  Encounter Date: 11/29/2014      PT End of Session - 11/29/14 1544    Visit Number 6   Number of Visits 9   Date for PT Re-Evaluation 01/01/14   PT Start Time 0805   PT Stop Time 0845   PT Time Calculation (min) 40 min   Activity Tolerance Patient tolerated treatment well      Past Medical History  Diagnosis Date  . Type II or unspecified type diabetes mellitus without mention of complication, not stated as uncontrolled   . Mixed hyperlipidemia   . OA (osteoarthritis) of knee     right  . Essential hypertension, benign   . Obesity, unspecified   . Cataract   . Parkinsons     Past Surgical History  Procedure Laterality Date  . Knee arthroscopy      right  . Ganglion cyst excision  age 63 years    right  . Wrist surgery  04/2012    There were no vitals taken for this visit.  Visit Diagnosis:  Abnormality of gait   Subjective:  No complaints.  Reports telling people about how hard those cards were with the thinking activity during gait.  No c/o pain. Objective:   Neuromuscular Re-ed:  PWR! Sit to stand x 10 reps from 22 inch surface, x 10 reps from 18 inch surface, then 10 reps, 5 reps from lower, compliant seated surface.  Pt performs standing PWR! Moves FLOW, with visual and verbal cues for improved transition between exercises, as well as added cognitive activity.  Standing on compliant surfaces:  Pt performs forward step and weighshift, side step and weightshift, and back step and weightshift, with cues for exaggerated movement to clear compliant surface.  Pt performs at counter, with UE support.  On compliant red mat surfaces:  pt performs forward walking, forward/back walking, marching forward, sidestepping, and  forward walk with head turns, with cues for increased deliberate step length and foot clearance.    Gait:  Gait training indoor surfaces, 800 ft, no device with conversational tasks, with cues for large amplitude movements for improved foot clearance.  Gait training with bilateral walking poles x 400 ft. for coordinated arm movement.  Pt requires frequent verbal cues for technique for UE coordination with walking poles.              PT Long Term Goals - 11/29/14 1547    PT LONG TERM GOAL #1   Title Pt will be independent with HEP to address Parkinson's related deficits.   Status On-going   PT LONG TERM GOAL #2   Title Pt will improve Functional Gait Assessment to at least 20/24 for decreased fall risk.   Status On-going   PT LONG TERM GOAL #3   Title Pt will perform 5x sit<>stand transfers in less than or equal to 11.5 seconds for improved transfer efficiency and safety.   Status On-going   PT LONG TERM GOAL #4   Title Pt will verbalize understanding of local Parkinson's disease-related resources.   Status On-going   PT LONG TERM GOAL #5   Title Pt will verbalize plans for ongoing community fitness upon D/C from PT.   Status On-going          Plan - 11/29/14 1544  Clinical Impression Statement Pt reports he is feeling comfortable with his exercises; he feels that he is ready for discharge next visit.   Pt will benefit from skilled therapeutic intervention in order to improve on the following deficits Abnormal gait;Decreased balance;Decreased endurance;Decreased mobility;Difficulty walking;Postural dysfunction   Rehab Potential Good   PT Frequency 2x / week   PT Duration 4 weeks  this is week 3 of 4   PT Treatment/Interventions Gait training;Functional mobility training;Neuromuscular re-education;Balance training;Therapeutic exercise;Therapeutic activities;Patient/family education   PT Next Visit Plan check goals and possibly discharge next visit                                Problem List Patient Active Problem List   Diagnosis Date Noted  . Parkinson disease 01/14/2014  . Obesity (BMI 30-39.9) 07/14/2013  . Confusional arousals 02/13/2013  . Disorder of ligament of right wrist 01/29/2012  . Type II or unspecified type diabetes mellitus without mention of complication, not stated as uncontrolled 10/09/2011  . Mixed hyperlipidemia 10/09/2011  . OA (osteoarthritis) of knee 10/09/2011  . Essential hypertension, benign 10/09/2011    MARRIOTT,AMY W. 11/29/2014, 3:50 PM  Mady Haagensen, PT 11/29/2014 3:51 PM Phone: [phone] Fax: [phone]

## 2014-11-30 ENCOUNTER — Other Ambulatory Visit: Payer: Self-pay | Admitting: Neurology

## 2014-11-30 NOTE — Telephone Encounter (Signed)
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  Has appt in Jan

## 2014-12-01 ENCOUNTER — Ambulatory Visit: Payer: 59 | Admitting: Physical Therapy

## 2014-12-01 ENCOUNTER — Encounter: Payer: Self-pay | Admitting: Physical Therapy

## 2014-12-01 DIAGNOSIS — Z5189 Encounter for other specified aftercare: Secondary | ICD-10-CM | POA: Diagnosis not present

## 2014-12-01 DIAGNOSIS — R269 Unspecified abnormalities of gait and mobility: Secondary | ICD-10-CM

## 2014-12-01 NOTE — Therapy (Signed)
[facility] [address] [place], [place], [address] Phone: [phone]   Fax:  [phone]  Physical Therapy Treatment  Patient Details  Name: Danny [name] MRN: [id number] Date of Birth: [birth year]  Encounter Date: 12/01/2014      PT End of Session - 12/01/14 1157    Visit Number 7   Number of Visits 9   Date for PT Re-Evaluation 01/01/14   PT Start Time 0805   PT Stop Time 0844   PT Time Calculation (min) 39 min   Equipment Utilized During Treatment Gait belt   Activity Tolerance Patient tolerated treatment well      Past Medical History  Diagnosis Date  . Type II or unspecified type diabetes mellitus without mention of complication, not stated as uncontrolled   . Mixed hyperlipidemia   . OA (osteoarthritis) of knee     right  . Essential hypertension, benign   . Obesity, unspecified   . Cataract   . Parkinsons     Past Surgical History  Procedure Laterality Date  . Knee arthroscopy      right  . Ganglion cyst excision  age 63 years    right  . Wrist surgery  04/2012    There were no vitals taken for this visit.  Visit Diagnosis:  Abnormality of gait      Subjective Assessment - 12/01/14 0809    Symptoms Denies changes or falls.  Was wrong about his schedule and does have 2 visits next week.  Wants to continue with those visits and discharge next week.   Currently in Pain? No/denies            [facility] Adult PT Treatment/Exercise - 12/01/14 0811    Knee/Hip Exercises: Aerobic   Stationary Bike Scifit level 2.0 x all 4 extremities x 8 minutes with rpm >65          PT Education - 12/01/14 1157    Education provided Yes   Education Details HEP   Person(s) Educated Patient   Methods Explanation;Demonstration   Comprehension Verbalized understanding            PT Long Term Goals - 12/01/14 1201    PT LONG TERM GOAL #1   Title Pt will be independent with HEP to address Parkinson's  related deficits.   Time 4   Period Weeks   Status Achieved   PT LONG TERM GOAL #4   Title Pt will verbalize understanding of local Parkinson's disease-related resources.   Time 4   Period Weeks   Status Achieved          Plan - 12/01/14 1159    Clinical Impression Statement Pt reports he is doing HEP and walking more at work.  Challenged by [facility]! flow.  Pt states he has 2 more visits scheduled next week and wants to continue with those and d/c next week.   Pt will benefit from skilled therapeutic intervention in order to improve on the following deficits Abnormal gait;Decreased balance;Decreased endurance;Decreased mobility;Difficulty walking;Postural dysfunction   Rehab Potential Good   PT Frequency 2x / week   PT Duration 4 weeks   PT Treatment/Interventions Gait training;Functional mobility training;Neuromuscular re-education;Balance training;Therapeutic exercise;Therapeutic activities;Patient/family education   PT Next Visit Plan Finish checking goals and prepare for d/c.   Consulted and Agree with Plan of Care Patient           [facility]) - 12/01/14 1152    PWR! exercises Moves in sitting;Moves in standing  PWR! Up x 5   PWR! Rock x 5   PWR! Twist x 5   PWR Step x 5   Basic 4 Flow 10 reps   Comments visual and verbale cues to reinforce   PWR! Up x 10   PWR! Rock x 5   PWR! Twist x 5   PWR! Step x 5   Basic 4 Flow x 10   Comments visual and verbal cues for reinforcement          [facility]) - 12/01/14 1155    Step and Reach Forward 10 reps   Step and Reach Backward 10 reps   Step and Reach Sideways 10 reps   Other Tasks 1 step and forward/backward/side step on compliant and non compliant surface then while following directions        Problem List Patient Active Problem List   Diagnosis Date Noted  . Parkinson disease 01/14/2014  . Obesity (BMI 30-39.9) 07/14/2013  . Confusional arousals 02/13/2013  . Disorder of ligament of right wrist 01/29/2012   . Type II or unspecified type diabetes mellitus without mention of complication, not stated as uncontrolled 10/09/2011  . Mixed hyperlipidemia 10/09/2011  . OA (osteoarthritis) of knee 10/09/2011  . Essential hypertension, benign 10/09/2011    Narda Bonds 12/01/2014, 12:03 PM     Narda Bonds, [place] 12/01/2014 12:03 PM Phone: [phone] Fax: [phone]

## 2014-12-06 ENCOUNTER — Encounter: Payer: Self-pay | Admitting: Physical Therapy

## 2014-12-06 ENCOUNTER — Ambulatory Visit: Payer: 59 | Admitting: Physical Therapy

## 2014-12-06 DIAGNOSIS — Z5189 Encounter for other specified aftercare: Secondary | ICD-10-CM | POA: Diagnosis not present

## 2014-12-06 DIAGNOSIS — R269 Unspecified abnormalities of gait and mobility: Secondary | ICD-10-CM

## 2014-12-06 NOTE — Therapy (Signed)
[facility] [address] [place], [place], [address] Phone: [phone]   Fax:  [phone]  Physical Therapy Treatment  Patient Details  Name: Danny [name] MRN: [id number] Date of Birth: August 11, 1951  Encounter Date: 12/06/2014      PT End of Session - 12/06/14 1040    Visit Number 8   Number of Visits 9   Date for PT Re-Evaluation 01/01/14   PT Start Time 0805   PT Stop Time 2878   PT Time Calculation (min) 42 min   Activity Tolerance Patient tolerated treatment well      Past Medical History  Diagnosis Date  . Type II or unspecified type diabetes mellitus without mention of complication, not stated as uncontrolled   . Mixed hyperlipidemia   . OA (osteoarthritis) of knee     right  . Essential hypertension, benign   . Obesity, unspecified   . Cataract   . Parkinsons     Past Surgical History  Procedure Laterality Date  . Knee arthroscopy      right  . Ganglion cyst excision  age 21 years    right  . Wrist surgery  04/2012    There were no vitals taken for this visit.  Visit Diagnosis:  Abnormality of gait      Subjective Assessment - 12/06/14 0810    Symptoms Denies changes or falls.  Exercises are going well.  Reports his friends say they can see a difference.   Currently in Pain? Yes   Pain Score 4    Pain Location Back   Pain Orientation Mid;Left   Pain Descriptors / Indicators Sore;Aching   Pain Type Acute pain   Aggravating Factors  twisting a certain way   Pain Relieving Factors hasnt tried anything yet          [facility] PT Assessment - 12/06/14 0837    Functional Gait  Assessment   Gait assessed  Yes   Gait Level Surface Walks 20 ft in less than 7 sec but greater than 5.5 sec, uses assistive device, slower speed, mild gait deviations, or deviates 6-10 in outside of the 12 in walkway width.   Change in Gait Speed Able to smoothly change walking speed without loss of balance or  gait deviation. Deviate no more than 6 in outside of the 12 in walkway width.   Gait with Horizontal Head Turns Performs head turns smoothly with no change in gait. Deviates no more than 6 in outside 12 in walkway width   Gait with Vertical Head Turns Performs head turns with no change in gait. Deviates no more than 6 in outside 12 in walkway width.   Gait and Pivot Turn Pivot turns safely within 3 sec and stops quickly with no loss of balance.   Step Over Obstacle Is able to step over 2 stacked shoe boxes taped together (9 in total height) without changing gait speed. No evidence of imbalance.   Gait with Narrow Base of Support Is able to ambulate for 10 steps heel to toe with no staggering.   Gait with Eyes Closed Walks 20 ft, slow speed, abnormal gait pattern, evidence for imbalance, deviates 10-15 in outside 12 in walkway width. Requires more than 9 sec to ambulate 20 ft.   Ambulating Backwards Walks 20 ft, uses assistive device, slower speed, mild gait deviations, deviates 6-10 in outside 12 in walkway width.   Steps Alternating feet, no rail.   Total Score 26  [facility] Adult PT Treatment/Exercise - 12/06/14 0001    Transfers   Transfers Sit to Stand;Stand to Sit   Sit to Stand 7: Independent;Without upper extremity assist;From chair/3-in-1;Five times sit to stand;Other/comment  10.59 seconds   Stand to Sit 7: Independent;Without upper extremity assist;To chair/3-in-1   Ambulation/Gait   Ambulation/Gait Yes   Ambulation/Gait Assistance 5: Supervision   Ambulation/Gait Assistance Details while performing cognitive task and maintaining speed.     Ambulation Distance (Feet) 1000 Feet   Assistive device None   Gait velocity 7.87 sec=4.17 ft/sec   Timed Up and Go Test   TUG Normal TUG;Manual TUG;Cognitive TUG   Normal TUG (seconds) 7.51   Manual TUG (seconds) 7.95   Cognitive TUG (seconds) 8.29   Knee/Hip Exercises: Aerobic   Tread Mill 2.3 mph x 6 minutes working on step length  and foot clearance with bil UE assist            PT Long Term Goals - 12/01/14 1201    PT LONG TERM GOAL #1   Title Pt will be independent with HEP to address Parkinson's related deficits.   Time 4   Period Weeks   Status Achieved   PT LONG TERM GOAL #4   Title Pt will verbalize understanding of local Parkinson's disease-related resources.   Time 4   Period Weeks   Status Achieved           Plan - 12/06/14 1040    Clinical Impression Statement Pt met LTG # 2 and 3.     Pt will benefit from skilled therapeutic intervention in order to improve on the following deficits Abnormal gait;Decreased balance;Decreased endurance;Decreased mobility;Difficulty walking;Postural dysfunction   Rehab Potential Good   PT Frequency 2x / week   PT Duration 4 weeks   PT Treatment/Interventions Gait training;Functional mobility training;Neuromuscular re-education;Balance training;Therapeutic exercise;Therapeutic activities;Patient/family education   PT Next Visit Plan Finish checking goals and prepare for d/c.   Consulted and Agree with Plan of Care Patient        Problem List Patient Active Problem List   Diagnosis Date Noted  . Parkinson disease 01/14/2014  . Obesity (BMI 30-39.9) 07/14/2013  . Confusional arousals 02/13/2013  . Disorder of ligament of right wrist 01/29/2012  . Type II or unspecified type diabetes mellitus without mention of complication, not stated as uncontrolled 10/09/2011  . Mixed hyperlipidemia 10/09/2011  . OA (osteoarthritis) of knee 10/09/2011  . Essential hypertension, benign 10/09/2011    Khylen, Riolo 12/06/2014, 10:44 AM  [facility] [address] [place], [place], [address] Phone: [phone]   Fax:  [place], PTA [place] 12/06/2014 10:44 AM Phone: [phone] Fax: [phone]

## 2014-12-07 ENCOUNTER — Ambulatory Visit: Payer: 59 | Admitting: Physical Therapy

## 2014-12-07 ENCOUNTER — Encounter: Payer: Self-pay | Admitting: Physical Therapy

## 2014-12-07 DIAGNOSIS — R269 Unspecified abnormalities of gait and mobility: Secondary | ICD-10-CM

## 2014-12-07 DIAGNOSIS — Z5189 Encounter for other specified aftercare: Secondary | ICD-10-CM | POA: Diagnosis not present

## 2014-12-07 NOTE — Therapy (Signed)
[facility] [address] [place], [place], [address] Phone: [phone]   Fax:  [phone]  Physical Therapy Treatment  Patient Details  Name: Danny [name] MRN: [id number] Date of Birth: [birth year]  Encounter Date: 12/07/2014      PT End of Session - 12/07/14 0840    Visit Number 9   Number of Visits 9   PT Start Time 0803   PT Stop Time 4034   PT Time Calculation (min) 39 min   Activity Tolerance Patient tolerated treatment well      Past Medical History  Diagnosis Date  . Type II or unspecified type diabetes mellitus without mention of complication, not stated as uncontrolled   . Mixed hyperlipidemia   . OA (osteoarthritis) of knee     right  . Essential hypertension, benign   . Obesity, unspecified   . Cataract   . Parkinsons     Past Surgical History  Procedure Laterality Date  . Knee arthroscopy      right  . Ganglion cyst excision  age 63 years    right  . Wrist surgery  04/2012    There were no vitals taken for this visit.  Visit Diagnosis:  Abnormality of gait      Subjective Assessment - 12/07/14 0805    Symptoms Today's my last day of therapy.  Other people are noticing that I'm moving better.  I'm more conscious of the way I walk now.   Currently in Pain? Yes   Pain Score 3    Pain Location Back   Pain Orientation Mid;Left   Pain Descriptors / Indicators Aching;Sore   Pain Type Acute pain   Aggravating Factors  twisting a certain way   Pain Relieving Factors not sure      OBJECTIVE: TherEx:  Seated SciFit, Level 2.5, 8 minutes, 4 extremities, with RPM >75, for improved lower extremity strength, flexibility, and work intensity level.  Discussed work effort level on aerobic machines; pt rates work effort at 7-8/10.  Self Care: Discussed components of optimal fitness program upon D/C from PT (provided pt instructions), including therapy exercises, walking program, and aerobic  exercises/activities.  Discussed plan of care, progress towards goals, and plans for discharge today-pt in agreement.  Discussed options for follow-up screen in 6-9 months for PT, OT, and speech therapy.  Pt completes FOTO survey, with the following scores:  Functional Status Score:  69 (improved from 51 at eval); Neuro QOL:  42.8 (improved from 38.4 at eval).                      PT Education - 12/07/14 [phone]    Education provided Yes   Education Details Optimal Fitness program upon D/C from PT, plans for D/C and follow-up screen   Person(s) Educated Patient   Methods Explanation;Handout   Comprehension Verbalized understanding             PT Long Term Goals - 12/07/14 0844    PT LONG TERM GOAL #1   Title Pt will be independent with HEP to address Parkinson's related deficits.   Status Achieved   PT LONG TERM GOAL #2   Title Pt will improve Functional Gait Assessment to at least 20/24 for decreased fall risk.   Status Achieved   PT LONG TERM GOAL #3   Title Pt will perform 5x sit<>stand transfers in less than or equal to 11.5 seconds for improved transfer efficiency and safety.  Status Achieved   PT LONG TERM GOAL #4   Title Pt will verbalize understanding of local Parkinson's disease-related resources.   Status Achieved   PT LONG TERM GOAL #5   Title Pt will verbalize plans for ongoing community fitness upon D/C from PT.   Status Achieved               Problem List Patient Active Problem List   Diagnosis Date Noted  . Parkinson disease 01/14/2014  . Obesity (BMI 30-39.9) 07/14/2013  . Confusional arousals 02/13/2013  . Disorder of ligament of right wrist 01/29/2012  . Type II or unspecified type diabetes mellitus without mention of complication, not stated as uncontrolled 10/09/2011  . Mixed hyperlipidemia 10/09/2011  . OA (osteoarthritis) of knee 10/09/2011  . Essential hypertension, benign 10/09/2011  PHYSICAL THERAPY DISCHARGE  SUMMARY  Visits from Start of Care: 9 Current functional level related to goals / functional outcomes: See above for goals.  Pt has demonstrated improvements in functional measurements in gait and transfers during the course of therapy.   Remaining deficits: Bradykinesia, gait (foot clearance)-improving   Education / Equipment: Pt has been educated in [employer], and community fitness, with pt verbalizing understanding. Plan: Patient agrees to discharge.  Patient goals were partially met. Patient is being discharged due to meeting the stated rehab goals.  ?????      Livvy Spilman W. 12/07/2014, 8:46 AM   Mady Haagensen, PT 12/07/2014 8:51 AM Phone: [phone] Fax: [place] [facility] [address] [place], [place], [address] Phone: [phone]   Fax:  [phone]

## 2014-12-07 NOTE — Patient Instructions (Signed)
Fitness Program upon Discharge from Therapy  1.  Therapy Exercises   -Do these exercises EVERY DAY  -These are to focus on posture, flexibility, balance-Parkinson's related issues  2.  Walking Program  -Work up to 20-30 minutes of walking 5 times per week  -Focus should be on posture, arm swing, long stride length  3.  Aerobic Exercises  -Working on machines such as seated stepper, recumbent bike, or treadmill  -Work up to 20-30 minutes, of high intensity work load (working up to 7-8/10)  -This can be a combination of machines; you could also do Silver Sneakers class  PWR! Moves Exercise Class- Wednesdays 3:15-4:00 (Call for more info if interested:  [phone])

## 2014-12-28 ENCOUNTER — Encounter: Payer: Self-pay | Admitting: Neurology

## 2014-12-28 ENCOUNTER — Ambulatory Visit (INDEPENDENT_AMBULATORY_CARE_PROVIDER_SITE_OTHER): Payer: 59 | Admitting: Neurology

## 2014-12-28 VITALS — BP 95/71 | HR 98 | Temp 98.4°F | Ht 70.5 in | Wt 225.0 lb

## 2014-12-28 DIAGNOSIS — G20 Parkinson's disease: Secondary | ICD-10-CM

## 2014-12-28 MED ORDER — PRAMIPEXOLE DIHYDROCHLORIDE 0.25 MG PO TABS: ORAL_TABLET | ORAL | Status: DC

## 2014-12-28 NOTE — Patient Instructions (Addendum)
I think your Parkinson's disease has remained fairly stable, which is reassuring. Nevertheless, as you know, this disease does progress with time. It can affect your balance, your memory, your mood, your bowel and bladder function, your posture, balance and walking. Overall you are doing fairly well but I do want to suggest a few things today:  Remember to drink plenty of fluid, eat healthy meals and do not skip any meals. Try to eat protein with a every meal and eat a healthy snack such as fruit or nuts in between meals. Try to keep a regular sleep-wake schedule and try to exercise daily, particularly in the form of walking, 20-30 minutes a day, if you can.   Taking your medication on schedule is key.   Try to stay active physically and mentally. Engage in social activities in your community and with your family and try to keep up with current events by reading the newspaper or watching the news. Try to do word puzzles and you may like to do word puzzles and brain games on the computer such as on [email or web address]   As far as your medications are concerned, I would like to suggest that you take your current medication with the following additional changes: no changes.     I would like to see you back in 4 to 5 months, sooner if we need to. Please call us with any interim questions, concerns, problems, updates or refill requests.  Our phone number is [phone]. We also have an after hours call service for urgent matters and there is a physician on-call for urgent questions, that cannot wait till the next work day. For any emergencies you know to call 911 or go to the nearest emergency room.

## 2014-12-28 NOTE — Progress Notes (Signed)
Subjective:    Patient ID: Danny [name] is a 64 y.o. male.  HPI     Interim history:   Danny [name] is a very pleasant 64 year old right-handed gentleman with an underlying medical history of type 2 diabetes, osteoarthritis, hypertension, obesity, hyperlipidemia, and cataracts, who presents for follow-up consultation of his Parkinson's disease, with history of RBD and treatment with Mirapex low-dose. The patient is accompanied by his wife today. This is his first visit with me and he previously followed with Dr. Jim Like and was last seen by him on 08/30/2014, at which time his Mirapex was kept at 0.25 mg 3 times a day. He reported no side effects, in particular no impulse control disorder. I reviewed Dr. Hazle Quant notes. He first met Dr. Janann Colonel on 10/26/13, at which time the patient reported a right hand tremor noticed over the previous 4-6 months. He had changes in his handwriting, slowness, some stiffness and changes in his walking.  His wife reported a longer standing history of REM behavior disorder. He has been in outpatient physical therapy.  Today, that he is more fatigued today. Of note, he is used to drinking 4 or 5 cups of coffee per day and has only had 1 cup of coffee and half a soda today. He had a dentist appointment earlier and has root canal pending for tomorrow. Overall, he and his wife feel that he has done well with the medication. He feels that he is easier fatigued and exhausted. He has not fallen recently. Mood is stable. He has some active dreams but has not fallen out of bed in the last couple of years. He had a sleep study in the past which did not show any significant obstructive sleep apnea per his report. He has had some short-term memory issues. He works full-time. He is an Chief Financial Officer. He likes square dance but his wife has noted that he has had more problems with his nighttime driving and his squared hands. In the recent 3 months he has had some intermittent double  vision at night. He has drooling at night. He rarely drinks alcohol and usually drinks 4-5 cups of coffee per day, occasional sodas. He quit smoking in 1978. Blood pressure is low for him today. It was recently noted to be trending lower and his blood pressure medication was reduced. He takes his cholesterol medication every other day.  His Past Medical History Is Significant For: Past Medical History  Diagnosis Date  . Type II or unspecified type diabetes mellitus without mention of complication, not stated as uncontrolled   . Mixed hyperlipidemia   . OA (osteoarthritis) of knee     right  . Essential hypertension, benign   . Obesity, unspecified   . Cataract   . Parkinsons     His Past Surgical History Is Significant For: Past Surgical History  Procedure Laterality Date  . Knee arthroscopy      right  . Ganglion cyst excision  age 82 years    right  . Wrist surgery  04/2012    His Family History Is Significant For: Family History  Problem Relation Age of Onset  . Diabetes Mother   . Diabetes Sister   . Diabetes Daughter   . Cancer Father   . Heart disease Father     His Social History Is Significant For: History   Social History  . Marital Status: Married    Spouse Name: [name]    Number of Children: 3  . Years of  Education: 16   Occupational History  . Engineer    Social History Main Topics  . Smoking status: Former Smoker -- 1.50 packs/day for 15 years    Types: Cigarettes    Quit date: 12/17/1982  . Smokeless tobacco: Never Used     Comment: over 40 yrs  . Alcohol Use: No     Comment: very rare  . Drug Use: No  . Sexual Activity:    Partners: Female   Other Topics Concern  . None   Social History Narrative   Research officer, trade union, motorcycle rider.  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  He has 3 adult children.  His wife had 2 children, one of whom is deceased.   Patient is working full-time.   Patient has a Bachelor's degree.   Patient is right-handed.    Patient drinks 6 cups of coffee daily.    His Allergies Are:  Allergies  Allergen Reactions  . Codeine Nausea And Vomiting  :   His Current Medications Are:  Outpatient Encounter Prescriptions as of 12/28/2014  Medication Sig  . aspirin 81 MG tablet Take 81 mg by mouth daily.    . enalapril (VASOTEC) 2.5 MG tablet 2.5 mg. Take 1/2 tablet by mouth  daily  . Ginger, Zingiber officinalis, 550 MG CAPS Take 550 mg by mouth daily.    Marland Kitchen GLIPIZIDE XL 2.5 MG 24 hr tablet Take 1 tablet by mouth  daily  . Glucosamine-Chondroit-Vit C-Mn (GLUCOSAMINE CHONDR 1500 COMPLX PO) Take 1,500 mg by mouth daily.    Marland Kitchen glucose blood test strip Use as instructed  . JANUMET 50-1000 MG per tablet Take 1 tablet by mouth  twice a day with meals  . pramipexole (MIRAPEX) 0.25 MG tablet Take 1 tablet (0.25 mg  total) by mouth 3 (three)  times daily.  . simvastatin (ZOCOR) 40 MG tablet Take one-half tablet by  mouth daily  . vitamin C (ASCORBIC ACID) 500 MG tablet Take 500 mg by mouth daily.    :  Review of Systems:  Out of a complete 14 point review of systems, all are reviewed and negative with the exception of these symptoms as listed below:   Review of Systems  Neurological: Positive for tremors.       Right hand     Objective:  Neurologic Exam  Physical Exam Physical Examination:   Filed Vitals:   12/28/14 1259  BP: 95/71  Pulse: 98  Temp: 98.4 F (36.9 C)    General Examination: The patient is a very pleasant 64 y.o. male in no acute distress.  HEENT: Normocephalic, atraumatic, pupils are equal, round and reactive to light and accommodation. Funduscopic exam is normal with sharp disc margins noted. Extraocular tracking shows mild saccadic breakdown without nystagmus noted. There is limitation to upper gaze. There is mild decrease in eye blink rate. He had bilateral cataract repairs. Hearing is intact. Face is symmetric with mild facial masking and normal facial sensation. There is no lip, neck or  jaw tremor. Neck is mildly rigid with intact passive ROM. There are no carotid bruits on auscultation. Oropharynx exam reveals moderate mouth dryness. No significant airway crowding is noted. Mallampati is class II. Tongue protrudes centrally and palate elevates symmetrically.   There is no significant drooling.   Chest: is clear to auscultation without wheezing, rhonchi or crackles noted.  Heart: sounds are regular and normal without murmurs, rubs or gallops noted.   Abdomen: is soft, non-tender and non-distended with normal bowel sounds appreciated  on auscultation.  Extremities: There is no pitting edema in the distal lower extremities bilaterally. Pedal pulses are intact.   Skin: is warm and dry with no trophic changes noted.   Musculoskeletal: exam reveals no obvious joint deformities, tenderness, joint swelling or erythema, with the exception of right wrist decrease in range of motion which is not new.  Neurologically:  Mental status: The patient is awake and alert, paying good  attention. He is able to completely provide the history. His wife provides details He is oriented to: person, place, time/date, situation, day of week, month of year and year. His memory, attention, language and knowledge are intact. There is no aphasia, agnosia, apraxia or anomia. There is a no significant degree of bradyphrenia. Speech is mildly hypophonic with no dysarthria noted. Mood is congruent and affect is normal.   Cranial nerves are as described above under HEENT exam. In addition, shoulder shrug is normal with equal shoulder height noted.  Motor exam: Normal bulk, and strength for age is noted. There are no dyskinesias.  Tone is mildly rigid with presence of cogwheeling in the right upper extremity. There is overall mild bradykinesia. There is no drift or rebound.  There is a very slight intermittent right upper extremity resting tremor. He has no postural or action tremor. Romberg is negative. Reflexes  are 2+ throughout.  Fine motor skills exam: Finger taps are mildly impaired on the right and Minimally impaired on the left. Hand movements are mildly impaired on the right and Minimally impaired on the left. RAP (rapid alternating patting) is Minimally impaired on the right and Not impaired on the left. Foot taps are mildly impaired on the right and Minimally impaired on the left. Foot agility (in the form of heel stomping) is Minimally impaired on the right and Not impaired on the left.    Cerebellar testing shows no dysmetria or intention tremor on finger to nose testing. Heel to shin is unremarkable bilaterally. There is no truncal or gait ataxia.   Sensory exam is intact to light touch, pinprick, vibration, temperature sense in the upper and lower extremities.   Gait, station and balance: He stands up from the seated position with very mild difficulty and does need to push up with His hands. He needs no assistance. No veering to one side is noted. He is not noted to lean to the side. Posture is mildly stooped. Stance is slightly wide-based. He walks with decrease in stride length and pace and decreased arm swing on the right. He turns in 3 steps. Tandem walk is not tested today. Balance is mildly impaired.  Assessment and Plan:   In summary, Danny [name] is a very pleasant 64 y.o.-year old male with an underlying medical history of type 2 diabetes, osteoarthritis, hypertension, obesity, hyperlipidemia, and cataracts, who presents for follow-up consultation of his right-sided predominant Parkinson's disease with stable findings and good results with low-dose Mirapex, 0.25 mg 3 times a day. He has recently finished physical therapy and has a reevaluation with physical, occupational and speech therapies scheduled for July of this year. He has a low blood pressure today but has not had his average caffeine intake and fluid intake because of the dentist appointment. He has root canal scheduled for  tomorrow. He is advised to continue to stay active mentally and physically. Subjectively he's had some short-term memory issues but generally speaking he is doing well. He is diabetic and his latest A1c was 6.2 on 08/04/2014. His lipid panel was  good at the time as well. I had a long chat with the patient and his wife about His symptoms, my findings and the diagnosis of parkinsonism/Parksinson's disease, its prognosis and treatment options. We talked about medical treatments and non-pharmacological approaches. We talked about maintaining a healthy lifestyle in general. I encouraged the patient to eat healthy, exercise daily and keep well hydrated, to keep a scheduled bedtime and wake time routine, to not skip any meals and eat healthy snacks in between meals and to have protein with every meal. In particular, I stressed the importance of regular exercise, within of course the patient's own mobility limitations.   As far as further diagnostic testing is concerned, I suggested: no test needed at this time, but I did ask him to monitor his blood pressure. I've asked him to increase his water intake.  As far as medications are concerned, I recommended the following at this time: no change.  I answered all their questions today and the patient and his wife were in agreement with the above outlined plan. I would like to see the patient back in 4 or 5 months, sooner if the need arises and encouraged them to call or email with any interim questions, concerns, problems or updates and refill requests. I did renew his Mirapex prescription for a 90 day supply with refills.

## 2015-01-27 ENCOUNTER — Ambulatory Visit: Payer: 59 | Admitting: Physician Assistant

## 2015-02-01 ENCOUNTER — Ambulatory Visit (INDEPENDENT_AMBULATORY_CARE_PROVIDER_SITE_OTHER): Payer: 59 | Admitting: Physician Assistant

## 2015-02-01 ENCOUNTER — Telehealth: Payer: Self-pay | Admitting: Neurology

## 2015-02-01 ENCOUNTER — Encounter: Payer: Self-pay | Admitting: Physician Assistant

## 2015-02-01 VITALS — BP 115/77 | HR 90 | Temp 98.1°F | Resp 16 | Ht 69.0 in | Wt 221.0 lb

## 2015-02-01 DIAGNOSIS — H532 Diplopia: Secondary | ICD-10-CM

## 2015-02-01 DIAGNOSIS — E669 Obesity, unspecified: Secondary | ICD-10-CM

## 2015-02-01 DIAGNOSIS — E782 Mixed hyperlipidemia: Secondary | ICD-10-CM

## 2015-02-01 DIAGNOSIS — I10 Essential (primary) hypertension: Secondary | ICD-10-CM

## 2015-02-01 DIAGNOSIS — E119 Type 2 diabetes mellitus without complications: Secondary | ICD-10-CM

## 2015-02-01 LAB — COMPLETE METABOLIC PANEL WITH GFR
ALT: 41 U/L (ref 0–53)
AST: 25 U/L (ref 0–37)
Albumin: 4.2 g/dL (ref 3.5–5.2)
Alkaline Phosphatase: 51 U/L (ref 39–117)
BILIRUBIN TOTAL: 1.3 mg/dL — AB (ref 0.2–1.2)
BUN: 16 mg/dL (ref 6–23)
CO2: 24 mEq/L (ref 19–32)
CREATININE: 1.04 mg/dL (ref 0.50–1.35)
Calcium: 9.7 mg/dL (ref 8.4–10.5)
Chloride: 98 mEq/L (ref 96–112)
GFR, EST AFRICAN AMERICAN: 88 mL/min
GFR, EST NON AFRICAN AMERICAN: 76 mL/min
GLUCOSE: 105 mg/dL — AB (ref 70–99)
Potassium: 4.3 mEq/L (ref 3.5–5.3)
Sodium: 131 mEq/L — ABNORMAL LOW (ref 135–145)
Total Protein: 7 g/dL (ref 6.0–8.3)

## 2015-02-01 LAB — POCT GLYCOSYLATED HEMOGLOBIN (HGB A1C): HEMOGLOBIN A1C: 6.4

## 2015-02-01 LAB — LIPID PANEL
CHOL/HDL RATIO: 2.5 ratio
Cholesterol: 122 mg/dL (ref 0–200)
HDL: 49 mg/dL (ref >39)
LDL CALC: 53 mg/dL (ref 0–99)
TRIGLYCERIDES: 100 mg/dL (ref <150)
VLDL: 20 mg/dL (ref 0–40)

## 2015-02-01 LAB — GLUCOSE, POCT (MANUAL RESULT ENTRY): POC GLUCOSE: 112 mg/dL — AB (ref 70–99)

## 2015-02-01 NOTE — Patient Instructions (Signed)
I will contact you with your lab results as soon as they are available.   If you have not heard from me in 2 weeks, please contact me.  The fastest way to get your results is to register for My Chart (see the instructions on the last page of this printout).   

## 2015-02-01 NOTE — Telephone Encounter (Signed)
Spoke with wife and rescheduled and confirmed appointment

## 2015-02-01 NOTE — Progress Notes (Signed)
Subjective:    Patient ID: Danny [name], male    DOB: [birth year], 64 y.o.   MRN: [id number]   PCP: Donnamaria Shands, PA-C  Chief Complaint  Patient presents with  . Diabetes  . disability placard    Allergies  Allergen Reactions  . Codeine Nausea And Vomiting    Patient Active Problem List   Diagnosis Date Noted  . Parkinson disease 01/14/2014  . Obesity (BMI 30-39.9) 07/14/2013  . Confusional arousals 02/13/2013  . Disorder of ligament of right wrist 01/29/2012  . Type II or unspecified type diabetes mellitus without mention of complication, not stated as uncontrolled 10/09/2011  . Mixed hyperlipidemia 10/09/2011  . OA (osteoarthritis) of knee 10/09/2011  . Essential hypertension, benign 10/09/2011    Prior to Admission medications   Medication Sig Start Date End Date Taking? Authorizing Provider  Cinnamon 500 MG capsule Take 500 mg by mouth daily.   Yes Historical Provider, MD  Multiple Vitamin (MULTIVITAMIN) tablet Take 1 tablet by mouth daily.   Yes Historical Provider, MD  aspirin 81 MG tablet Take 81 mg by mouth daily.      Historical Provider, MD  enalapril (VASOTEC) 2.5 MG tablet 2.5 mg. Take 1/2 tablet by mouth  daily 05/11/14   Giulio Bertino S Trentan Trippe, PA-C  Ginger, Zingiber officinalis, 550 MG CAPS Take 550 mg by mouth daily.      Historical Provider, MD  GLIPIZIDE XL 2.5 MG 24 hr tablet Take 1 tablet by mouth  daily 08/27/14   Moria Brophy S James Senn, PA-C  Glucosamine-Chondroit-Vit C-Mn (GLUCOSAMINE CHONDR 1500 COMPLX PO) Take 1,500 mg by mouth daily.      Historical Provider, MD  glucose blood test strip Use as instructed 09/15/13   Areta Haber Dunn, PA-C  JANUMET 50-1000 MG per tablet Take 1 tablet by mouth  twice a day with meals    Dennis Hegeman S Royale Swamy, PA-C  pramipexole (MIRAPEX) 0.25 MG tablet Take 1 tablet (0.25 mg  total) by mouth 3 (three)  times daily. 12/28/14   [name], MD  simvastatin (ZOCOR) 40 MG tablet Take one-half tablet by  mouth daily 08/27/14   Zalea Pete S  Annaliese Saez, PA-C  vitamin C (ASCORBIC ACID) 500 MG tablet Take 500 mg by mouth daily.      Historical Provider, MD    Medical, Surgical, Family and Social History reviewed and updated.  HPI  Presents for follow-up of diabetes. In general, he feels well. He's occasionally having some mobility troubles, and would like to go ahead and get a handicap placard. He has fallen once, when his foot slipped off a curb. Still enjoying square dancing, though mostly calls the dances.  Double vision x 1-2 months. Closes one eye to see if needed. Primarily horizontal, but sometimes has simultaneous vertical diplopia. No longer drives at night.  Frequency of home glucose monitoring: infrequent, "but it's always good." Sees a dentist Q6 months, eye specialist annually. Checks feet daily. Is current with influenza vaccine. Is current with pneumococcal vaccine. Will need Prevnar 13 and booster pneumovax when he turns 65.   Review of Systems As above. Denies chest pain, shortness of breath, HA, dizziness,  nausea, vomiting, diarrhea, constipation, melena, hematochezia, dysuria, increased urinary urgency or frequency, increased hunger or thirst, unintentional weight change, unexplained myalgias or arthralgias, rash.     Objective:   Physical Exam  Constitutional: He is oriented to person, place, and time. Vital signs are normal. He appears well-developed and well-nourished. He is active and cooperative. No distress.  BP 115/77 mmHg  Pulse 90  Temp(Src) 98.1 F (36.7 C)  Resp 16  Ht 5\' 9"  (1.753 m)  Wt 221 lb (100.245 kg)  BMI 32.62 kg/m2  SpO2 94%  HENT:  Head: Normocephalic and atraumatic.  Right Ear: Hearing normal.  Left Ear: Hearing normal.  Eyes: Conjunctivae are normal. No scleral icterus.  Neck: Normal range of motion. Neck supple. No thyromegaly present.  Cardiovascular: Normal rate, regular rhythm and normal heart sounds.   Pulses:      Radial pulses are 2+ on the right side, and 2+ on  the left side.  Pulmonary/Chest: Effort normal and breath sounds normal.  Musculoskeletal:  Somewhat bradykinesic.  Lymphadenopathy:       Head (right side): No tonsillar, no preauricular, no posterior auricular and no occipital adenopathy present.       Head (left side): No tonsillar, no preauricular, no posterior auricular and no occipital adenopathy present.    He has no cervical adenopathy.       Right: No supraclavicular adenopathy present.       Left: No supraclavicular adenopathy present.  Neurological: He is alert and oriented to person, place, and time. No sensory deficit.  Skin: Skin is warm, dry and intact. No rash noted. No cyanosis or erythema. Nails show no clubbing.  Psychiatric: He has a normal mood and affect. His speech is normal and behavior is normal. Judgment and thought content normal. Cognition and memory are normal.  Mildly masked facies, but smiles and laughs appropriately.      Results for orders placed or performed in visit on 02/01/15  POCT glucose (manual entry)  Result Value Ref Range   POC Glucose 112 (A) 70 - 99 mg/dl  POCT glycosylated hemoglobin (Hb A1C)  Result Value Ref Range   Hemoglobin A1C 6.4        Assessment & Plan:  1. Essential hypertension, benign Controlled. Continue current treatment. - COMPLETE METABOLIC PANEL WITH GFR  2. Mixed hyperlipidemia Await lab results. LDL goal <70 due to DM. - Lipid panel  3. Diabetes mellitus without complication Well controlled. Continue current treatment. - POCT glucose (manual entry) - POCT glycosylated hemoglobin (Hb A1C)  4. Obesity (BMI 30-39.9) Continue efforts for healthy eating and regular exercise.  5. Double vision Concerning new symptom.  Contacted [facility] and scheduled evaluation for 02/04/2015 at 1 pm.   [name], PA-C Physician Assistant-Certified [facility]

## 2015-02-04 ENCOUNTER — Encounter: Payer: Self-pay | Admitting: Neurology

## 2015-02-04 ENCOUNTER — Ambulatory Visit (INDEPENDENT_AMBULATORY_CARE_PROVIDER_SITE_OTHER): Payer: 59 | Admitting: Neurology

## 2015-02-04 VITALS — BP 129/84 | HR 80 | Temp 98.1°F | Ht 70.5 in | Wt 204.0 lb

## 2015-02-04 DIAGNOSIS — G20 Parkinson's disease: Secondary | ICD-10-CM

## 2015-02-04 DIAGNOSIS — H532 Diplopia: Secondary | ICD-10-CM

## 2015-02-04 MED ORDER — PRAMIPEXOLE DIHYDROCHLORIDE 0.5 MG PO TABS: ORAL_TABLET | ORAL | Status: DC

## 2015-02-04 NOTE — Progress Notes (Signed)
Subjective:    Patient ID: Danny [name] is a 64 y.o. male.  HPI     Interim history:   Danny [name] is a very pleasant 64 year old right-handed gentleman with an underlying medical history of type 2 diabetes, osteoarthritis, hypertension, obesity, hyperlipidemia, and cataracts, who presents for follow-up consultation of his Parkinson's disease, with history of RBD and treatment with Mirapex low-dose. The patient is accompanied by his wife again today. I first met him on 12/28/2014, at which time he reported that he was having more fatigue. However, he also had reduce his caffeine intake. He had no recent falls and mood stable. He was working full-time. He reported that he had intermittent double vision in the last 3 months. His PCP requested a sooner than scheduled appointment for diplopia. His exam was stable at the time and I suggested no new medication changes.  Today, he reports ongoing issues with diplopia, particularly at night while driving. This is not a severe degree of double vision meaning that he does not seem necessarily 2 separate pictures side to side but just another image of of each other. It helps to close one eye at a time. It bothers him some but does not impair his ability to drive. He works full-time. He still likes to dance. He has had no new symptoms. No one-sided weakness, numbness, slurring of speech, droopy face, no new headaches, no eye pain, no discharge. She still is on just a low dose of Mirapex 0.25 g 3 times a day. He has had no side effects, in particular no swelling, no severe sleepiness. He tries to get 8 hours of sleep. He does twitch at night. He had a sleep study which did not show obstructive sleep apnea at the time. He has fallen out of bed one time in the past about 2 years ago while dreaming. He does not recall a lot of dreams lately.  He has had some short-term memory issues. He works full-time. He is an Chief Financial Officer. He likes square dance but his wife has  noted that he has had more problems with his nighttime driving and his squared hands. In the recent 3 months he has had some intermittent double vision at night. He has drooling at night. He rarely drinks alcohol and usually drinks 4-5 cups of coffee per day, occasional sodas. He quit smoking in 1978. Blood pressure is low for him today. It was recently noted to be trending lower and his blood pressure medication was reduced. He takes his cholesterol medication every other day.  He had a brain MRI without contrast on 11/04/2013: Normal MRI scan of the brain. Incidental findings of a chronic paranasal sinusitis with deviated nasal septum to the left.  He previously followed with Danny [name] and was last seen by him on 08/30/2014, at which time his Mirapex was kept at 0.25 mg 3 times a day. He reported no side effects, in particular no impulse control disorder. I reviewed Danny [name] notes. He first met Danny [name] on 10/26/13, at which time the patient reported a right hand tremor noticed over the previous 4-6 months. He had changes in his handwriting, slowness, some stiffness and changes in his walking.  His wife reported a longer standing history of REM behavior disorder. He has been in outpatient physical therapy.  His Past Medical History Is Significant For: Past Medical History  Diagnosis Date  . Type II or unspecified type diabetes mellitus without mention of complication, not stated as uncontrolled   .  Mixed hyperlipidemia   . OA (osteoarthritis) of knee     right  . Essential hypertension, benign   . Obesity, unspecified   . Cataract   . Parkinsons     His Past Surgical History Is Significant For: Past Surgical History  Procedure Laterality Date  . Knee arthroscopy      right  . Ganglion cyst excision  age 5 years    right  . Wrist surgery  04/2012    His Family History Is Significant For: Family History  Problem Relation Age of Onset  . Diabetes Mother   . Diabetes  Sister   . Diabetes Daughter   . Cancer Father   . Heart disease Father     His Social History Is Significant For: History   Social History  . Marital Status: Married    Spouse Name: Danny [name]  . Number of Children: 3  . Years of Education: 16   Occupational History  . Engineer    Social History Main Topics  . Smoking status: Former Smoker -- 1.50 packs/day for 15 years    Types: Cigarettes    Quit date: 12/17/1982  . Smokeless tobacco: Never Used     Comment: over 40 yrs  . Alcohol Use: No     Comment: very rare  . Drug Use: No  . Sexual Activity:    Partners: Female   Other Topics Concern  . 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  He has 3 adult children.  His wife had 2 children, one of whom is deceased.   Patient is working full-time.   Patient has a Bachelor's degree.   Patient is right-handed.   Patient drinks 6 cups of coffee daily.    His Allergies Are:  Allergies  Allergen Reactions  . Codeine Nausea And Vomiting  :   His Current Medications Are:  Outpatient Encounter Prescriptions as of 02/04/2015  Medication Sig  . aspirin 81 MG tablet Take 81 mg by mouth daily.    . Cinnamon 500 MG capsule Take 500 mg by mouth daily.  . enalapril (VASOTEC) 2.5 MG tablet 2.5 mg. Take 1/2 tablet by mouth  daily  . Ginger, Zingiber officinalis, 550 MG CAPS Take 550 mg by mouth daily.    Marland Kitchen GLIPIZIDE XL 2.5 MG 24 hr tablet Take 1 tablet by mouth  daily  . Glucosamine-Chondroit-Vit C-Mn (GLUCOSAMINE CHONDR 1500 COMPLX PO) Take 1,500 mg by mouth daily.    Marland Kitchen glucose blood test strip Use as instructed  . JANUMET 50-1000 MG per tablet Take 1 tablet by mouth  twice a day with meals  . Multiple Vitamin (MULTIVITAMIN) tablet Take 1 tablet by mouth daily.  . pramipexole (MIRAPEX) 0.25 MG tablet Take 1 tablet (0.25 mg  total) by mouth 3 (three)  times daily.  . simvastatin (ZOCOR) 40 MG tablet Take one-half tablet by  mouth  daily  . vitamin C (ASCORBIC ACID) 500 MG tablet Take 500 mg by mouth daily.    :  Review of Systems:  Out of a complete 14 point review of systems, all are reviewed and negative with the exception of these symptoms as listed below:   Review of Systems  Constitutional: Positive for fatigue.  diplopia  Objective:  Neurologic Exam  Physical Exam Physical Examination:   Filed Vitals:   02/04/15 0851  BP: 129/84  Pulse: 80  Temp: 98.1 F (36.7 C)  General Examination: The patient is a very pleasant 64 y.o. male in no acute distress. He is calm and cooperative. He is in good spirits today. He does not have any diplopia currently.  HEENT: Normocephalic, atraumatic, pupils are equal, round and reactive to light and accommodation. Funduscopic exam is normal with sharp disc margins noted. Extraocular tracking shows mild saccadic breakdown without nystagmus noted. There is limitation to upper gaze. There is mild decrease in eye blink rate. He had bilateral cataract repairs. Hearing is intact. Face is symmetric with mild facial masking and normal facial sensation. There is no lip, neck or jaw tremor. Neck is moderately rigid with intact passive ROM. There are no carotid bruits on auscultation. Oropharynx exam reveals moderate mouth dryness. No significant airway crowding is noted. Mallampati is class II. Tongue protrudes centrally and palate elevates symmetrically. There is no significant drooling.   Chest: is clear to auscultation without wheezing, rhonchi or crackles noted.  Heart: sounds are regular and normal without murmurs, rubs or gallops noted.   Abdomen: is soft, non-tender and non-distended with normal bowel sounds appreciated on auscultation.  Extremities: There is no pitting edema in the distal lower extremities bilaterally. Pedal pulses are intact.   Skin: is warm and dry with no trophic changes noted.   Musculoskeletal: exam reveals no obvious joint deformities,  tenderness, joint swelling or erythema, with the exception of right wrist decrease in range of motion which is not new.  Neurologically:  Mental status: The patient is awake and alert, paying good  attention. He is able to completely provide the history. His wife provides details He is oriented to: person, place, time/date, situation, day of week, month of year and year. His memory, attention, language and knowledge are intact. There is no aphasia, agnosia, apraxia or anomia. There is a no significant degree of bradyphrenia. Speech is mildly hypophonic with no dysarthria noted. Mood is congruent and affect is normal.   Cranial nerves are as described above under HEENT exam. In addition, shoulder shrug is normal with equal shoulder height noted.  Motor exam: Normal bulk, and strength for age is noted. There are no dyskinesias.  Tone is mildly rigid with presence of cogwheeling in the right upper extremity. There is overall mild bradykinesia. There is no drift or rebound.  There is a very slight intermittent right upper extremity resting tremor. He has no postural or action tremor. Romberg is negative. Reflexes are 2+ throughout.  Fine motor skills exam: Finger taps are mildly impaired on the right and Minimally impaired on the left. Hand movements are mildly impaired on the right and Minimally impaired on the left. RAP (rapid alternating patting) is Minimally impaired on the right and Not impaired on the left. Foot taps are mildly impaired on the right and Minimally impaired on the left. Foot agility (in the form of heel stomping) is Minimally impaired on the right and Not impaired on the left.    Cerebellar testing shows no dysmetria or intention tremor on finger to nose testing. Heel to shin is unremarkable bilaterally. There is no truncal or gait ataxia.   Sensory exam is intact to light touch, pinprick, vibration, temperature sense in the upper and lower extremities.   Gait, station and balance:  He stands up from the seated position with very mild difficulty and does need to push up with His hands. He needs no assistance. No veering to one side is noted. He is not noted to lean to the side. Posture is mildly  stooped. Stance is slightly wide-based. He walks with decrease in stride length and pace and decreased arm swing on the right. He turns in 3 steps. Tandem walk is not tested today. Balance is very mildly impaired.  Assessment and Plan:   In summary, Danny [name] is a very pleasant 64 year old male with an underlying medical history of type 2 diabetes, osteoarthritis, hypertension, obesity, hyperlipidemia, and cataracts, who presents for follow-up consultation of his right-sided predominant Parkinson's disease with stable findings with the exception of increased neck tone today. He has had intermittent diplopia, particularly at night, particularly when driving. He had is last eye exam in November 2015. Today I suggested we increase his Mirapex which has been very low dose. He has had good results with a mild increase in his Mirapex we may be able to improve his parkinsonian symptoms and as a result perhaps his diplopia. I don't see any telltale signs for any brainstem dysfunction or any other new problems. If the increase in Mirapex does not help his eye motility he is encouraged to have another eye check. He may have to see an ophthalmologist. At this juncture, I have advised him to increase Mirapex to 0.5 mg 3 times a day. I provided a new prescription and he can also double up his 0.25 mg tablets until he gets his new supply in the mail. He has an appointment pending for me in May and asked him to keep it. I think overall he is doing well. I reassured them in that regard. I did not see any new findings. I would [name] for him to continue to stay active mentally and physically.  I asked him to call with any interim questions or concerns. I answered all his questions today and he was in  agreement.

## 2015-02-04 NOTE — Patient Instructions (Signed)
I think your Parkinson's disease has remained fairly stable, which is reassuring. Nevertheless, as you know, this disease does progress with time. It can affect your balance, your memory, your mood, your bowel and bladder function, your posture, balance and walking. Overall you are doing fairly well but I do want to suggest a few things today:  Remember to drink plenty of fluid, eat healthy meals and do not skip any meals. Try to eat protein with a every meal and eat a healthy snack such as fruit or nuts in between meals. Try to keep a regular sleep-wake schedule and try to exercise daily, particularly in the form of walking, 20-30 minutes a day, if you can.   Taking your medication on schedule is key.   Try to stay active physically and mentally. Engage in social activities in your community and with your family and try to keep up with current events by reading the newspaper or watching the news. Try to do word puzzles and you may like to do word puzzles and brain games on the computer such as on [email or web address]   As far as your medications are concerned, I would like to suggest that you take your current medication with the following additional changes: Increase Mirapex to 0.5 mg three times a day.     As far as diagnostic testing, I will order: If your double vision gets worse or does not improve much with the increase in Mirapex, see your eye doctor.   I would like to see you back in May as previously schedule.   Our phone number is [phone]. We also have an after hours call service for urgent matters and there is a physician on-call for urgent questions, that cannot wait till the next work day. For any emergencies you know to call 911 or go to the nearest emergency room.

## 2015-03-04 ENCOUNTER — Other Ambulatory Visit: Payer: Self-pay | Admitting: Physician Assistant

## 2015-03-14 ENCOUNTER — Other Ambulatory Visit: Payer: Self-pay

## 2015-03-14 MED ORDER — SIMVASTATIN 40 MG PO TABS: ORAL_TABLET | ORAL | Status: DC

## 2015-03-14 MED ORDER — GLIPIZIDE ER 2.5 MG PO TB24: ORAL_TABLET | ORAL | Status: DC

## 2015-03-21 LAB — HM DIABETES EYE EXAM

## 2015-03-31 ENCOUNTER — Encounter: Payer: Self-pay | Admitting: Physician Assistant

## 2015-04-25 ENCOUNTER — Encounter: Payer: Self-pay | Admitting: Neurology

## 2015-04-25 ENCOUNTER — Telehealth: Payer: Self-pay

## 2015-04-25 ENCOUNTER — Ambulatory Visit (INDEPENDENT_AMBULATORY_CARE_PROVIDER_SITE_OTHER): Payer: 59 | Admitting: Neurology

## 2015-04-25 VITALS — BP 112/72 | HR 68 | Resp 16 | Ht 70.0 in | Wt 255.0 lb

## 2015-04-25 DIAGNOSIS — H532 Diplopia: Secondary | ICD-10-CM

## 2015-04-25 DIAGNOSIS — G4752 REM sleep behavior disorder: Secondary | ICD-10-CM

## 2015-04-25 DIAGNOSIS — G20 Parkinson's disease: Secondary | ICD-10-CM

## 2015-04-25 NOTE — Progress Notes (Signed)
Subjective:    Patient ID: Danny [name] is a 64 y.o. male.  HPI     Interim history:   Danny [name] is a very pleasant 64 year old right-handed gentleman with an underlying medical history of type 2 diabetes, osteoarthritis, hypertension, obesity, hyperlipidemia, and cataracts, who presents for follow-up consultation of his Parkinson's disease, with history of RBD and treatment with Mirapex low-dose. The patient is accompanied by his wife again today. I last saw him on 02/04/2015, at which time he reported ongoing issues with diplopia, particularly at night while driving. He was still working full-time. He was still on low-dose Mirapex, 0.25 mg 3 times a day. He had no side effects. He had had a sleep study which per his report did not show any OSA. I asked him to make an appointment with his eye doctor. I suggested an increase in Mirapex to 0.5 mg 3 times a day.  Today, 04/25/2015: He provides most of his own history. His wife reports some additional details. He reports doing fairly well overall, but has noted smaller changes. She has noted, that he has become slower. Thankfully he has not fallen. He took a business trip with his boss recently and was noted to have difficulty with climbing ladders. He is advised by his boss to not climb ladders or walk over obstacles at work. Thankfully, his work environment is understanding. He has a business trip coming up and he will have his colleague drive and do the climbing on the sites. He has mild forgetfulness. He has some problems with depth perception. His diplopia has improved since he was given prism glasses by his ophthalmologist.  Previously:   I first met him on 12/28/2014, at which time he reported that he was having more fatigue. However, he also had reduce his caffeine intake. He had no recent falls and mood stable. He was working full-time. He reported that he had intermittent double vision in the last 3 months. His PCP requested a sooner  than scheduled appointment for diplopia. His exam was stable at the time and I suggested no new medication changes.   He has had some short-term memory issues. He works full-time. He is an Chief Financial Officer. He likes square dance but his wife has noted that he has had more problems with his nighttime driving and his squared hands. In the recent 3 months he has had some intermittent double vision at night. He has drooling at night. He rarely drinks alcohol and usually drinks 4-5 cups of coffee per day, occasional sodas. He quit smoking in 1978. Blood pressure is low for him today. It was recently noted to be trending lower and his blood pressure medication was reduced. He takes his cholesterol medication every other day.  He had a brain MRI without contrast on 11/04/2013: Normal MRI scan of the brain. Incidental findings of a chronic paranasal sinusitis with deviated nasal septum to the left.  He previously followed with Dr. Jim Like and was last seen by him on 08/30/2014, at which time his Mirapex was kept at 0.25 mg 3 times a day. He reported no side effects, in particular no impulse control disorder. I reviewed Dr. Hazle Quant notes. He first met Dr. Janann Colonel on 10/26/13, at which time the patient reported a right hand tremor noticed over the previous 4-6 months. He had changes in his handwriting, slowness, some stiffness and changes in his walking.  His wife reported a longer standing history of REM behavior disorder. He has been in outpatient physical therapy.  His Past Medical History Is Significant For: Past Medical History  Diagnosis Date  . Type II or unspecified type diabetes mellitus without mention of complication, not stated as uncontrolled   . Mixed hyperlipidemia   . OA (osteoarthritis) of knee     right  . Essential hypertension, benign   . Obesity, unspecified   . Cataract   . Parkinsons     His Past Surgical History Is Significant For: Past Surgical History  Procedure Laterality Date   . Knee arthroscopy      right  . Ganglion cyst excision  age 28 years    right  . Wrist surgery  04/2012    His Family History Is Significant For: Family History  Problem Relation Age of Onset  . Diabetes Mother   . Diabetes Sister   . Diabetes Daughter   . Cancer Father   . Heart disease Father     His Social History Is Significant For: History   Social History  . Marital Status: Married    Spouse Name: Danny [name]  . Number of Children: 3  . Years of Education: 16   Occupational History  . Engineer    Social History Main Topics  . Smoking status: Former Smoker -- 1.50 packs/day for 15 years    Types: Cigarettes    Quit date: 12/17/1982  . Smokeless tobacco: Never Used     Comment: over 40 yrs  . Alcohol Use: No     Comment: very rare  . Drug Use: No  . Sexual Activity:    Partners: Female   Other Topics Concern  . 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  He has 3 adult children.  His wife had 2 children, one of whom is deceased.   Patient is working full-time.   Patient has a Bachelor's degree.   Patient is right-handed.   Patient drinks 6 cups of coffee daily.    His Allergies Are:  Allergies  Allergen Reactions  . Codeine Nausea And Vomiting  :   His Current Medications Are:  Outpatient Encounter Prescriptions as of 04/25/2015  Medication Sig  . aspirin 81 MG tablet Take 81 mg by mouth daily.    . Cinnamon 500 MG capsule Take 500 mg by mouth daily.  . enalapril (VASOTEC) 2.5 MG tablet 2.5 mg. Take 1/2 tablet by mouth  daily  . Ginger, Zingiber officinalis, 550 MG CAPS Take 550 mg by mouth daily.    Marland Kitchen glipiZIDE (GLIPIZIDE XL) 2.5 MG 24 hr tablet Take 1 tablet by mouth  daily  . Glucosamine-Chondroit-Vit C-Mn (GLUCOSAMINE CHONDR 1500 COMPLX PO) Take 1,500 mg by mouth daily.    Marland Kitchen glucose blood test strip Use as instructed  . JANUMET 50-1000 MG per tablet Take 1 tablet by mouth  twice a day with  meals  . Multiple Vitamin (MULTIVITAMIN) tablet Take 1 tablet by mouth daily.  . pramipexole (MIRAPEX) 0.5 MG tablet Take 1 tablet (0.25 mg  total) by mouth 3 (three)  times daily.  . simvastatin (ZOCOR) 40 MG tablet Take one-half tablet by  mouth daily  . vitamin C (ASCORBIC ACID) 500 MG tablet Take 500 mg by mouth daily.     No facility-administered encounter medications on file as of 04/25/2015.  :  Review of Systems:  Out of a complete 14 point review of systems, all are reviewed and negative with the exception of these symptoms as listed below:  Review of Systems  Neurological:       Wife feels like patient is walking slower, having some memory loss, and problems with depth perception     Objective:  Neurologic Exam  Physical Exam Physical Examination:   Filed Vitals:   04/25/15 0914  BP: 112/72  Pulse: 68  Resp: 16    General Examination: The patient is a very pleasant 64 y.o. male in no acute distress. He is calm and cooperative. He is in good spirits today. He does not have any diplopia currently.  HEENT: Normocephalic, atraumatic, pupils are equal, round and reactive to light and accommodation. Funduscopic exam is normal with sharp disc margins noted. He has prism eye glasses. Extraocular tracking shows mild saccadic breakdown without nystagmus noted. There is limitation to upper gaze. There is mild decrease in eye blink rate. He had bilateral cataract repairs. Hearing is intact. Face is symmetric with mild facial masking and normal facial sensation. There is no lip, neck or jaw tremor. Neck is moderately rigid with intact passive ROM. There are no carotid bruits on auscultation. Oropharynx exam reveals moderate mouth dryness. No significant airway crowding is noted. Mallampati is class II. Tongue protrudes centrally and palate elevates symmetrically. There is no significant drooling.   Chest: is clear to auscultation without wheezing, rhonchi or crackles noted.  Heart:  sounds are regular and normal without murmurs, rubs or gallops noted.   Abdomen: is soft, non-tender and non-distended with normal bowel sounds appreciated on auscultation.  Extremities: There is no pitting edema in the distal lower extremities bilaterally. Pedal pulses are intact.   Skin: is warm and dry with no trophic changes noted.   Musculoskeletal: exam reveals no obvious joint deformities, tenderness, joint swelling or erythema, with the exception of right wrist decrease in range of motion which is not new.  Neurologically:  Mental status: The patient is awake and alert, paying good  attention. He is able to completely provide the history. His wife provides details He is oriented to: person, place, time/date, situation, day of week, month of year and year. His memory, attention, language and knowledge are intact. There is no aphasia, agnosia, apraxia or anomia. There is a no significant degree of bradyphrenia. Speech is mildly hypophonic with no dysarthria noted. Mood is congruent and affect is normal.   Cranial nerves are as described above under HEENT exam. In addition, shoulder shrug is normal with equal shoulder height noted.  Motor exam: Normal bulk, and strength for age is noted. There are no dyskinesias.  Tone is mildly rigid with presence of cogwheeling in the right upper extremity. There is overall mild bradykinesia. There is no drift or rebound.  There is a very slight intermittent right upper extremity resting tremor. He has no postural or action tremor. Romberg is negative. Reflexes are 2+ throughout.  Fine motor skills exam: Finger taps are mild to moderately impaired on the right and Minimally impaired on the left. Hand movements are mildly impaired on the right and Minimally impaired on the left. RAP (rapid alternating patting) is Minimally impaired on the right and Not impaired on the left. Foot taps are mildly impaired on the right and Minimally impaired on the left. Foot  agility (in the form of heel stomping) is Minimally impaired on the right and Not impaired on the left.    Cerebellar testing shows no dysmetria or intention tremor on finger to nose testing. Heel to shin is unremarkable bilaterally. There is no truncal or gait ataxia.  Sensory exam is intact to light touch, pinprick, vibration, temperature sense in the upper and lower extremities.   Gait, station and balance: He stands up from the seated position with very mild difficulty and does need to push up with His hands. He needs no assistance. No veering to one side is noted. He is not noted to lean to the side. Posture is mildly stooped. Stance is slightly wide-based. He walks with decrease in stride length and pace and decreased arm swing on the right. He turns in 3 steps. Tandem walk is not tested today. Balance is very mildly impaired.  Assessment and Plan:   In summary, Danny [name] is a very pleasant 64 year old male with an underlying medical history of type 2 diabetes, osteoarthritis, hypertension, obesity, hyperlipidemia, and cataracts, who presents for follow-up consultation of his right-sided predominant Parkinson's disease with  mildly progressive symptoms. He has resolution of his diplopia since he was given a prism eye glasses. I suggested we not into trying Azilect. He remembers that was prescribed in the past but he could not fill it secondary to very high co-pay. I would like to look into this again. He may be eligible to use the co-pay card. 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 Our second option would be to increase Mirapex to 0.75 mg 3 times a day. He was agreeable to this. I've encouraged him to exercise regularly, avoid climbing on stairs and working at heights, and his wife is advised to monitor his driving. He does not drive longer distances. They will sell their motor bike. At this juncture, I did not make any changes to his medication until we have some  clarification about whether he could fill a prescription for Azilect. Otherwise I will adjust his Mirapex prescription to 0.75 mg 3 times a day. I will see him back in 4 months, sooner if the need arises. He is encouraged to call or email with any interim questions or concerns. I answered all her questions today and they were in agreement.  I spent 25 minutes in total face-to-face time with the patient, more than 50% of which was spent in counseling and coordination of care, reviewing test results, reviewing medication and discussing or reviewing the diagnosis of PD, its prognosis and treatment options.

## 2015-04-25 NOTE — Telephone Encounter (Signed)
I called the patient regarding Azilect.  Explained we did contact his ins plan, and this drug is covered, however, they would not disclose the co-pay amount to [place].  Since he is not Medicare, he should be able to use the discount voucher, which has a maximum benefit of $110 per 30 days.  This would reduce his co-pay up to $110 per fill.  Patient may obtain voucher from [email or web address].  He indicated he would like to speak with ins first and see what his co-pay would be, then will call us back to advise if he is comfortable with the cost of med.

## 2015-04-25 NOTE — Patient Instructions (Addendum)
I think your Parkinson's disease has remained fairly stable, which is reassuring. Nevertheless, as you know, this disease does progress with time. It can affect your balance, your memory, your mood, your bowel and bladder function, your posture, balance and walking. Overall you are doing fairly well but I do want to suggest a few things today:  Remember to drink plenty of fluid, eat healthy meals and do not skip any meals. Try to eat protein with a every meal and eat a healthy snack such as fruit or nuts in between meals. Try to keep a regular sleep-wake schedule and try to exercise daily, particularly in the form of walking, 20-30 minutes a day, if you can.   Taking your medication on schedule is key.   Try to stay active physically and mentally. Engage in social activities in your community and with your family and try to keep up with current events by reading the newspaper or watching the news. Try to do word puzzles and you may like to do word puzzles and brain games on the computer such as on [email or web address]   As far as your medications are concerned, I would like to suggest that you take your current medication with the following additional changes: we will look into starting Azilect. If not affordable, we will increase your pramipexole to 0.75 mg three times a day.   I would like to see you back in 6 months, sooner if we need to. Please call us with any interim questions, concerns, problems, updates or refill requests.  Please also call us for any test results so we can go over those with you on the phone. Our nursing staff will answer any of your questions and relay your messages to me and also relay most of my messages to you.  Our phone number is [phone]. We also have an after hours call service for urgent matters and there is a physician on-call for urgent questions, that cannot wait till the next work day. For any emergencies you know to call 911 or go to the nearest emergency room.

## 2015-04-26 ENCOUNTER — Other Ambulatory Visit: Payer: Self-pay | Admitting: Physician Assistant

## 2015-04-26 ENCOUNTER — Encounter: Payer: Self-pay | Admitting: Neurology

## 2015-05-03 ENCOUNTER — Ambulatory Visit: Payer: 59 | Admitting: Physician Assistant

## 2015-05-17 ENCOUNTER — Encounter: Payer: Self-pay | Admitting: Physician Assistant

## 2015-05-17 ENCOUNTER — Ambulatory Visit (INDEPENDENT_AMBULATORY_CARE_PROVIDER_SITE_OTHER): Payer: 59 | Admitting: Physician Assistant

## 2015-05-17 ENCOUNTER — Encounter: Payer: Self-pay | Admitting: Neurology

## 2015-05-17 VITALS — BP 118/80 | HR 86 | Temp 97.9°F | Resp 16 | Wt 221.8 lb

## 2015-05-17 DIAGNOSIS — E119 Type 2 diabetes mellitus without complications: Secondary | ICD-10-CM

## 2015-05-17 DIAGNOSIS — Z125 Encounter for screening for malignant neoplasm of prostate: Secondary | ICD-10-CM

## 2015-05-17 DIAGNOSIS — N5089 Other specified disorders of the male genital organs: Secondary | ICD-10-CM

## 2015-05-17 DIAGNOSIS — Z1211 Encounter for screening for malignant neoplasm of colon: Secondary | ICD-10-CM | POA: Diagnosis not present

## 2015-05-17 DIAGNOSIS — Z0000 Encounter for general adult medical examination without abnormal findings: Secondary | ICD-10-CM

## 2015-05-17 DIAGNOSIS — I10 Essential (primary) hypertension: Secondary | ICD-10-CM

## 2015-05-17 DIAGNOSIS — N508 Other specified disorders of male genital organs: Secondary | ICD-10-CM | POA: Diagnosis not present

## 2015-05-17 DIAGNOSIS — Z114 Encounter for screening for human immunodeficiency virus [HIV]: Secondary | ICD-10-CM | POA: Diagnosis not present

## 2015-05-17 LAB — POCT UA - MICROSCOPIC ONLY
CASTS, UR, LPF, POC: NEGATIVE
CRYSTALS, UR, HPF, POC: NEGATIVE
Epithelial cells, urine per micros: NEGATIVE
MUCUS UA: POSITIVE
RBC, URINE, MICROSCOPIC: NEGATIVE
Yeast, UA: NEGATIVE

## 2015-05-17 LAB — CBC WITH DIFFERENTIAL/PLATELET
BASOS ABS: 0.1 10*3/uL (ref 0.0–0.1)
Basophils Relative: 1 % (ref 0–1)
Eosinophils Absolute: 0.2 10*3/uL (ref 0.0–0.7)
Eosinophils Relative: 2 % (ref 0–5)
HCT: 42.5 % (ref 39.0–52.0)
Hemoglobin: 15 g/dL (ref 13.0–17.0)
LYMPHS PCT: 20 % (ref 12–46)
Lymphs Abs: 1.7 10*3/uL (ref 0.7–4.0)
MCH: 31.6 pg (ref 26.0–34.0)
MCHC: 35.3 g/dL (ref 30.0–36.0)
MCV: 89.5 fL (ref 78.0–100.0)
MONO ABS: 0.7 10*3/uL (ref 0.1–1.0)
MPV: 11.1 fL (ref 8.6–12.4)
Monocytes Relative: 8 % (ref 3–12)
NEUTROS PCT: 69 % (ref 43–77)
Neutro Abs: 5.7 10*3/uL (ref 1.7–7.7)
Platelets: 198 10*3/uL (ref 150–400)
RBC: 4.75 MIL/uL (ref 4.22–5.81)
RDW: 14.2 % (ref 11.5–15.5)
WBC: 8.3 10*3/uL (ref 4.0–10.5)

## 2015-05-17 LAB — POCT URINALYSIS DIPSTICK
Blood, UA: NEGATIVE
Glucose, UA: NEGATIVE
Leukocytes, UA: NEGATIVE
Nitrite, UA: NEGATIVE
PROTEIN UA: NEGATIVE
Spec Grav, UA: >=1.03
Urobilinogen, UA: 0.2
pH, UA: 5

## 2015-05-17 LAB — COMPREHENSIVE METABOLIC PANEL
ALT: 37 U/L (ref 0–53)
AST: 19 U/L (ref 0–37)
Albumin: 4.3 g/dL (ref 3.5–5.2)
Alkaline Phosphatase: 50 U/L (ref 39–117)
BILIRUBIN TOTAL: 1.2 mg/dL (ref 0.2–1.2)
BUN: 21 mg/dL (ref 6–23)
CO2: 22 mEq/L (ref 19–32)
Calcium: 9.7 mg/dL (ref 8.4–10.5)
Chloride: 104 mEq/L (ref 96–112)
Creat: 1.04 mg/dL (ref 0.50–1.35)
Glucose, Bld: 133 mg/dL — ABNORMAL HIGH (ref 70–99)
Potassium: 4.6 mEq/L (ref 3.5–5.3)
SODIUM: 137 meq/L (ref 135–145)
TOTAL PROTEIN: 6.8 g/dL (ref 6.0–8.3)

## 2015-05-17 LAB — HIV ANTIBODY (ROUTINE TESTING W REFLEX): HIV: NONREACTIVE

## 2015-05-17 LAB — GLUCOSE, POCT (MANUAL RESULT ENTRY): POC Glucose: 123 mg/dl — AB (ref 70–99)

## 2015-05-17 LAB — POCT GLYCOSYLATED HEMOGLOBIN (HGB A1C): Hemoglobin A1C: 6.6

## 2015-05-17 NOTE — Progress Notes (Signed)
Subjective:    Patient ID: Danny [name], male    DOB: 1951-07-17, 64 y.o.   MRN: [id number]  PCP: Jasiah Buntin, PA-C  Chief Complaint  Patient presents with  . Annual Exam  . Fatigue  . Gait Problem    Allergies  Allergen Reactions  . Codeine Nausea And Vomiting    Prior to Admission medications   Medication Sig Start Date End Date Taking? Authorizing Provider  aspirin 81 MG tablet Take 81 mg by mouth daily.     Yes Historical Provider, MD  Cinnamon 500 MG capsule Take 500 mg by mouth daily.   Yes Historical Provider, MD  enalapril (VASOTEC) 2.5 MG tablet Take 1 tablet by mouth  daily 04/26/15  Yes Thara Searing, PA-C  Ginger, Zingiber officinalis, 550 MG CAPS Take 550 mg by mouth daily.     Yes Historical Provider, MD  glipiZIDE (GLIPIZIDE XL) 2.5 MG 24 hr tablet Take 1 tablet by mouth  daily 03/14/15  Yes Wardell Honour, MD  Glucosamine-Chondroit-Vit C-Mn (GLUCOSAMINE CHONDR 1500 COMPLX PO) Take 1,500 mg by mouth daily.     Yes Historical Provider, MD  glucose blood test strip Use as instructed 09/15/13  Yes Ryan M Dunn, PA-C  JANUMET 50-1000 MG per tablet Take 1 tablet by mouth  twice a day with meals 04/26/15  Yes Mirayah Wren, PA-C  Multiple Vitamin (MULTIVITAMIN) tablet Take 1 tablet by mouth daily.   Yes Historical Provider, MD  pramipexole (MIRAPEX) 0.5 MG tablet Take 1 tablet (0.25 mg  total) by mouth 3 (three)  times daily. 02/04/15  Yes [name], MD  simvastatin (ZOCOR) 40 MG tablet Take one-half tablet by  mouth daily 03/14/15  Yes Wardell Honour, MD  vitamin C (ASCORBIC ACID) 500 MG tablet Take 500 mg by mouth daily.     Yes Historical Provider, MD  enalapril (VASOTEC) 2.5 MG tablet 2.5 mg. Take 1/2 tablet by mouth  daily 05/11/14   [name], PA-C   Past Medical History  Diagnosis Date  . Type II or unspecified type diabetes mellitus without mention of complication, not stated as uncontrolled   . Mixed hyperlipidemia   . OA (osteoarthritis) of knee     right  . Essential hypertension, benign   . Obesity, unspecified   . Cataract   . Parkinsons    History   Social History  . Marital Status: Married    Spouse Name: [name]  . Number of Children: 3  . Years of Education: 16   Occupational History  . Engineer    Social History Main Topics  . Smoking status: Former Smoker -- 1.50 packs/day for 15 years    Types: Cigarettes    Quit date: 12/17/1982  . Smokeless tobacco: Never Used     Comment: over 40 yrs  . Alcohol Use: No     Comment: very rare  . Drug Use: No  . Sexual Activity:    Partners: Female   Other Topics Concern  . Not on file   Social History Narrative   [name], motorcycle rider.  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  He has 3 adult children.  His wife had 2 children, one of whom is deceased.   Patient is working full-time.   Patient has a Bachelor's degree.   Patient is right-handed.   Patient drinks 6 cups of coffee daily.   Family History  Problem Relation Age of Onset  . Diabetes Mother   . Diabetes Sister   .  Diabetes Daughter   . Cancer Father   . Heart disease Father     HPI  Presents for annual wellness exam.  In addition, he is due for follow-up regarding Diabetes and HTN, both of which have been well controlled.   Parkinson's has been mildly progressive, but the thing that frustrates him and limits him the most. He saw Dr. Rexene Alberts earlier this month. They plan to try a different medication that was previously too costly.  He has since found out that it is covered by his insurance, so the plan is to try it to slow progression of his symptoms. Increased fatigue. Increased loss of balance. Increased drooling. Still square dancing, but fatigue limits him to 1 set. 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  Walking beyond 100 feet is too much, and has to stop and rest. "sways" when he stands still. Stairs are particularly difficult. He and his wife are talking about getting him a cane, which he doesn't want,  but recognizes that he needs.  Review of Systems  Constitutional: Positive for fatigue. Negative for fever, chills, diaphoresis, activity change, appetite change and unexpected weight change.  HENT: Negative.   Eyes: Positive for visual disturbance (double vision resolved with prism lens, notes reduced visual acuity on the LEFT).  Respiratory: Negative.   Cardiovascular: Negative.   Gastrointestinal: Negative.   Endocrine: Negative.   Genitourinary: Negative.   Musculoskeletal: Positive for gait problem. Negative for myalgias, back pain, joint swelling, arthralgias, neck pain and neck stiffness.  Skin: Negative.   Allergic/Immunologic: Negative.   Neurological: Positive for tremors and weakness (generalized). Negative for dizziness, syncope, light-headedness, numbness and headaches.  Hematological: Negative.   Psychiatric/Behavioral: Negative.        Objective:   Physical Exam  Constitutional: He is oriented to person, place, and time. Vital signs are normal. He appears well-developed and well-nourished. He is active and cooperative.  Non-toxic appearance. He does not have a sickly appearance. He does not appear ill. No distress.  BP 118/80 mmHg  Pulse 86  Temp(Src) 97.9 F (36.6 C) (Oral)  Resp 16  Wt 221 lb 12.8 oz (100.608 kg)  SpO2 96%   HENT:  Head: Normocephalic and atraumatic.  Right Ear: Hearing, tympanic membrane, external ear and ear canal normal.  Left Ear: Hearing, tympanic membrane, external ear and ear canal normal.  Nose: Nose normal.  Mouth/Throat: Uvula is midline, oropharynx is clear and moist and mucous membranes are normal. He does not have dentures. No oral lesions. No trismus in the jaw. Normal dentition. No dental abscesses, uvula swelling, lacerations or dental caries.  Eyes: Conjunctivae, EOM and lids are normal. Pupils are equal, round, and reactive to light. Right eye exhibits no discharge. Left eye exhibits no discharge. No scleral icterus.    Fundoscopic exam:      The right eye shows no arteriolar narrowing, no AV nicking, no exudate, no hemorrhage and no papilledema.       The left eye shows no arteriolar narrowing, no AV nicking, no exudate, no hemorrhage and no papilledema.  Neck: Normal range of motion, full passive range of motion without pain and phonation normal. Neck supple. No spinous process tenderness and no muscular tenderness present. No rigidity. No tracheal deviation, no edema, no erythema and normal range of motion present. No thyromegaly present.  Cardiovascular: Normal rate, regular rhythm, S1 normal, S2 normal, normal heart sounds, intact distal pulses and normal pulses.  Exam reveals no gallop and no friction rub.   No  murmur heard. Pulmonary/Chest: Effort normal and breath sounds normal. No respiratory distress. He has no wheezes. He has no rales.  Abdominal: Soft. Normal appearance and bowel sounds are normal. He exhibits no distension and no mass. There is no hepatosplenomegaly. There is no tenderness. There is no rebound and no guarding. No hernia. Hernia confirmed negative in the right inguinal area and confirmed negative in the left inguinal area.  Genitourinary: Rectum normal, prostate normal and penis normal.    Right testis shows no mass, no swelling and no tenderness. Right testis is descended. Cremasteric reflex is not absent on the right side. Left testis shows mass. Left testis shows no swelling and no tenderness. Left testis is descended. Cremasteric reflex is not absent on the left side. Uncircumcised. No phimosis, paraphimosis, hypospadias, penile erythema or penile tenderness. No discharge found.  Musculoskeletal: Normal range of motion. He exhibits no edema or tenderness.       Cervical back: Normal. He exhibits normal range of motion, no tenderness, no bony tenderness, no swelling, no edema, no deformity, no laceration, no pain, no spasm and normal pulse.       Thoracic back: Normal.       Lumbar  back: Normal.  Lymphadenopathy:       Head (right side): No submental, no submandibular, no tonsillar, no preauricular, no posterior auricular and no occipital adenopathy present.       Head (left side): No submental, no submandibular, no tonsillar, no preauricular, no posterior auricular and no occipital adenopathy present.    He has no cervical adenopathy.       Right: No inguinal and no supraclavicular adenopathy present.       Left: No inguinal and no supraclavicular adenopathy present.  Neurological: He is alert and oriented to person, place, and time. He has normal strength and normal reflexes. He displays no tremor. No cranial nerve deficit. He exhibits normal muscle tone. Coordination and gait normal.  Skin: Skin is warm and dry. Abrasion (dorsum of both feet, resolving, consistent with rubbing from new sandals) and ecchymosis (LEFT forearm, 1 cm, resolving) noted. No laceration, no lesion and no rash noted. He is not diaphoretic. No cyanosis or erythema. No pallor. Nails show no clubbing.  Psychiatric: He has a normal mood and affect. His speech is normal and behavior is normal. Judgment and thought content normal. Cognition and memory are normal.   Diabetic Foot Exam - Simple   Simple Foot Form  Diabetic Foot exam was performed with the following findings:  Yes 05/17/2015  9:08 AM  Visual Inspection  No deformities, no ulcerations, no other skin breakdown bilaterally:  Yes  Sensation Testing  Intact to touch and monofilament testing bilaterally:  Yes  Pulse Check  Posterior Tibialis and Dorsalis pulse intact bilaterally:  Yes  Comments            Assessment & Plan:  1. Annual physical exam Age appropriate anticipatory guidance provided.   2. Diabetes mellitus type 2, uncomplicated Await lab results. Has been very well controlled. Will adjust regimen if indicated. - HM Diabetes Foot Exam - POCT glucose (manual entry) - POCT glycosylated hemoglobin (Hb A1C) - Microalbumin,  urine - Comprehensive metabolic panel  3. Essential hypertension, benign Controlled. Continue current treatment. - CBC with Differential/Platelet - POCT UA - Microscopic Only - POCT urinalysis dipstick  4. Screening for HIV (human immunodeficiency virus) - HIV antibody  5. Screening for prostate cancer Normal prostate exam today. Await labs. - PSA  6. Screening  for colon cancer Colonoscopy was normal in 2010. Annual hemoccult testing until repeat colonoscopy in 2020, unless positive result. - POC Hemoccult Bld/Stl (3-Cd Home Screen); Future  7. Scrotal mass Likely varicocele. Await [place] results. Will refer to urology if indicated. - US Scrotum; Future   [name], PA-C Physician Assistant-Certified [facility]

## 2015-05-17 NOTE — Patient Instructions (Signed)
I will contact you with your lab results as soon as they are available.   If you have not heard from me in 2 weeks, please contact me.  The fastest way to get your results is to register for My Chart (see the instructions on the last page of this printout).  Keeping you healthy  Get these tests  Blood pressure- Have your blood pressure checked once a year by your healthcare provider.  Normal blood pressure is 120/80  Weight- Have your body mass index (BMI) calculated to screen for obesity.  BMI is a measure of body fat based on height and weight. You can also calculate your own BMI at ViewBanking.si.  Cholesterol- Have your cholesterol checked every year.  Diabetes- Have your blood sugar checked regularly if you have high blood pressure, high cholesterol, have a family history of diabetes or if you are overweight.  Screening for Colon Cancer- Colonoscopy starting at age 7.  Screening may begin sooner depending on your family history and other health conditions. Follow up colonoscopy as directed by your Gastroenterologist.  Screening for Prostate Cancer- Both blood work (PSA) and a rectal exam help screen for Prostate Cancer.  Screening begins at age 58 with African-American men and at age 34 with Caucasian men.  Screening may begin sooner depending on your family history.  Take these medicines  Aspirin- One aspirin daily can help prevent Heart disease and Stroke.  Flu shot- Every fall.  Tetanus- Every 10 years.  Zostavax- Once after the age of 31 to prevent Shingles.  Pneumonia shot- Once after the age of 36; if you are younger than 10, ask your healthcare provider if you need a Pneumonia shot.  Take these steps  Don't smoke- If you do smoke, talk to your doctor about quitting.  For tips on how to quit, go to www.smokefree.gov or call 1-800-QUIT-NOW.  Be physically active- Exercise 5 days a week for at least 30 minutes.  If you are not already physically active start  slow and gradually work up to 30 minutes of moderate physical activity.  Examples of moderate activity include walking briskly, mowing the yard, dancing, swimming, bicycling, etc.  Eat a healthy diet- Eat a variety of healthy food such as fruits, vegetables, low fat milk, low fat cheese, yogurt, lean meant, poultry, fish, beans, tofu, etc. For more information go to www.thenutritionsource.org  Drink alcohol in moderation- Limit alcohol intake to less than two drinks a day. Never drink and drive.  Dentist- Brush and floss twice daily; visit your dentist twice a year.  Depression- Your emotional health is as important as your physical health. If you're feeling down, or losing interest in things you would normally enjoy please talk to your healthcare provider.  Eye exam- Visit your eye doctor every year.  Safe sex- If you may be exposed to a sexually transmitted infection, use a condom.  Seat belts- Seat belts can save your life; always wear one.  Smoke/Carbon Monoxide detectors- These detectors need to be installed on the appropriate level of your home.  Replace batteries at least once a year.  Skin cancer- When out in the sun, cover up and use sunscreen 15 SPF or higher.  Violence- If anyone is threatening you, please tell your healthcare provider.  Living Will/ Health care power of attorney- Speak with your healthcare provider and family.

## 2015-05-18 LAB — MICROALBUMIN, URINE: MICROALB UR: 0.9 mg/dL (ref <2.0)

## 2015-05-18 LAB — PSA: PSA: 0.54 ng/mL (ref <=4.00)

## 2015-05-20 ENCOUNTER — Encounter: Payer: Self-pay | Admitting: Physician Assistant

## 2015-05-23 ENCOUNTER — Telehealth: Payer: Self-pay | Admitting: Neurology

## 2015-05-23 ENCOUNTER — Ambulatory Visit
Admission: RE | Admit: 2015-05-23 | Discharge: 2015-05-23 | Disposition: A | Discharge status: Home / Self Care | Payer: 59 | Source: Ambulatory Visit | Attending: Physician Assistant | Admitting: Physician Assistant

## 2015-05-23 DIAGNOSIS — G20 Parkinson's disease: Secondary | ICD-10-CM

## 2015-05-23 DIAGNOSIS — N5089 Other specified disorders of the male genital organs: Secondary | ICD-10-CM

## 2015-05-23 DIAGNOSIS — N503 Cyst of epididymis: Secondary | ICD-10-CM

## 2015-05-23 DIAGNOSIS — G20A1 Parkinson's disease without dyskinesia, without mention of fluctuations: Secondary | ICD-10-CM

## 2015-05-23 MED ORDER — RASAGILINE MESYLATE 1 MG PO TABS: ORAL_TABLET | ORAL | Status: DC

## 2015-05-23 NOTE — Telephone Encounter (Signed)
I called the patient at home, got no answer.  Left message.  Called cell.  Spoke with patient.  Relayed providers note.  He verbalized understanding and will call us back if needed.

## 2015-05-23 NOTE — Telephone Encounter (Signed)
Jessica:  Please call patient. I'm not sure how I overlooked his messages. The last one was not sent to me. At any rate, I apologize for the delay in the Azilect prescription. I put in a prescription to send to his mail order pharmacy.  We will start Azilect (generic name: rasagiline) 1 mg strength: take 1/2 pill each morning for 2 weeks, then 1 whole pill each morning thereafter and ongoing until further notice, or unless you develop an adverse reaction or side effects. Common side effects reported are headaches, nausea, diarrhea, or constipation. Rare side effects can be confusion, personality changes, hallucinations.

## 2015-05-25 DIAGNOSIS — N503 Cyst of epididymis: Secondary | ICD-10-CM | POA: Insufficient documentation

## 2015-06-15 ENCOUNTER — Other Ambulatory Visit: Payer: Self-pay

## 2015-06-15 ENCOUNTER — Encounter: Payer: Self-pay | Admitting: Physician Assistant

## 2015-06-16 ENCOUNTER — Other Ambulatory Visit: Payer: Self-pay

## 2015-06-16 MED ORDER — SIMVASTATIN 40 MG PO TABS: ORAL_TABLET | ORAL | Status: DC

## 2015-06-20 ENCOUNTER — Other Ambulatory Visit: Payer: Self-pay

## 2015-06-20 MED ORDER — SIMVASTATIN 40 MG PO TABS: ORAL_TABLET | ORAL | Status: DC

## 2015-06-30 ENCOUNTER — Ambulatory Visit: Payer: 59 | Admitting: Occupational Therapy

## 2015-06-30 ENCOUNTER — Ambulatory Visit: Payer: 59

## 2015-06-30 ENCOUNTER — Ambulatory Visit: Payer: 59 | Attending: Neurology | Admitting: Physical Therapy

## 2015-06-30 DIAGNOSIS — R269 Unspecified abnormalities of gait and mobility: Secondary | ICD-10-CM

## 2015-06-30 DIAGNOSIS — R471 Dysarthria and anarthria: Secondary | ICD-10-CM

## 2015-06-30 NOTE — Therapy (Signed)
[facility] [address] [place], [place], [address] Phone: [phone]   Fax:  [phone]  Patient Details  Name: Danny [name] MRN: [id number] Date of Birth: 1951/05/19 Referring Provider:  [name], PA-C  Encounter Date: 06/30/2015  Physical Therapy Parkinson's Disease Screen   Timed Up and Go test:11.03 sec  10 meter walk test: 3.28 ft/sec  5 time sit to stand test:12.94 sec    Patient does not require Physical Therapy services at this time.  He feels that he has not noticed any functional changes, despite slight slowing since last bout of PT.  He wants to start back into exercises and then return for screen in approximately 6 months.  Recommend Physical Therapy screen in 6-8 months (to be scheduled after speech therapy is complete.)    Jamison Yuhasz W. 06/30/2015, 8:19 AM  Frazier Butt., PT  [facility] [address] [place], [place], [address] Phone: [phone]   Fax:  [phone]

## 2015-06-30 NOTE — Therapy (Signed)
[facility] [address] [place], [place], [address] Phone: [phone]   Fax:  [phone]  Patient Details  Name: Danny [name] MRN: [id number] Date of Birth: Apr 25, 1951 Referring Provider:  [name], PA-C  Encounter Date: 06/30/2015 Occupational Therapy Parkinson's Disease Screen    Physical Performance Test item #4 (donning/doffing jacket):  11.60 secs  9-hole peg test:    RUE  29.09 secs        LUE  29.25  Box & Blocks Test:   RUE  52 blocks        LUE  55 blocks   Change in ability to perform ADLs/IADLs:  Pt reports occasional difficulty with small buttons but is doing well overall. Pt reports no problems with coordination interfering with his job.     Pt does not require occupation therapy services at this time.  Recommended occupational therapy screen in   6 mons  RINE,KATHRYN 06/30/2015, 8:39 AM  [facility] [address] [place], [place], [address] Phone: [phone]   Fax:  [phone]

## 2015-06-30 NOTE — Therapy (Signed)
[facility] [address] [place], [place], [address] Phone: [phone]   Fax:  [phone]  Patient Details  Name: Danny [name] MRN: [id number] Date of Birth: [birth year] Referring Provider:  [name], MD  Encounter Date: 06/30/2015  Speech Therapy Parkinson's Disease Screen    Decibel Level today: 70-71dB  (WNL=70-72 dB) in 10 minutes conversation, with sound level meter 30cm away from pt's mouth. Pt's conversational speech is characterized by occasional short rushes of speech which decrease listener's ability to comprehend pt's message at 100%. Pt would benefit from a speech-language eval for dysarthria.   Pt has not experienced difficulty in swallowing warranting an objective evaluation.     [facility] , [place], [place]  06/30/2015, 9:28 AM  [facility] [address] [place], [place], [address] Phone: [phone]   Fax:  [phone]

## 2015-07-18 ENCOUNTER — Other Ambulatory Visit: Payer: Self-pay | Admitting: Family Medicine

## 2015-07-18 MED ORDER — SIMVASTATIN 40 MG PO TABS: ORAL_TABLET | ORAL | Status: DC

## 2015-08-09 ENCOUNTER — Other Ambulatory Visit: Payer: Self-pay | Admitting: Physician Assistant

## 2015-08-11 ENCOUNTER — Encounter: Payer: Self-pay | Admitting: Physician Assistant

## 2015-08-12 ENCOUNTER — Other Ambulatory Visit: Payer: Self-pay | Admitting: Physician Assistant

## 2015-08-15 MED ORDER — SIMVASTATIN 40 MG PO TABS
ORAL_TABLET | ORAL | Status: DC
Start: 2015-08-15 — End: 2016-07-23

## 2015-08-15 MED ORDER — SITAGLIPTIN PHOS-METFORMIN HCL 50-1000 MG PO TABS: ORAL_TABLET | ORAL | Status: DC

## 2015-08-16 ENCOUNTER — Encounter: Payer: Self-pay | Admitting: Physician Assistant

## 2015-08-16 ENCOUNTER — Ambulatory Visit (INDEPENDENT_AMBULATORY_CARE_PROVIDER_SITE_OTHER): Payer: 59 | Admitting: Physician Assistant

## 2015-08-16 VITALS — BP 114/74 | HR 90 | Temp 99.0°F | Resp 18 | Ht 69.5 in | Wt 224.2 lb

## 2015-08-16 DIAGNOSIS — R1013 Epigastric pain: Secondary | ICD-10-CM | POA: Diagnosis not present

## 2015-08-16 DIAGNOSIS — L989 Disorder of the skin and subcutaneous tissue, unspecified: Secondary | ICD-10-CM

## 2015-08-16 DIAGNOSIS — E782 Mixed hyperlipidemia: Secondary | ICD-10-CM | POA: Diagnosis not present

## 2015-08-16 DIAGNOSIS — Z23 Encounter for immunization: Secondary | ICD-10-CM | POA: Diagnosis not present

## 2015-08-16 DIAGNOSIS — E119 Type 2 diabetes mellitus without complications: Secondary | ICD-10-CM | POA: Diagnosis not present

## 2015-08-16 DIAGNOSIS — I10 Essential (primary) hypertension: Secondary | ICD-10-CM | POA: Diagnosis not present

## 2015-08-16 DIAGNOSIS — K30 Functional dyspepsia: Secondary | ICD-10-CM

## 2015-08-16 LAB — COMPREHENSIVE METABOLIC PANEL
ALBUMIN: 4.2 g/dL (ref 3.6–5.1)
ALT: 38 U/L (ref 9–46)
AST: 22 U/L (ref 10–35)
Alkaline Phosphatase: 47 U/L (ref 40–115)
BUN: 11 mg/dL (ref 7–25)
CHLORIDE: 103 mmol/L (ref 98–110)
CO2: 24 mmol/L (ref 20–31)
Calcium: 9.7 mg/dL (ref 8.6–10.3)
Creat: 1.01 mg/dL (ref 0.70–1.25)
Glucose, Bld: 173 mg/dL — ABNORMAL HIGH (ref 65–99)
POTASSIUM: 4.5 mmol/L (ref 3.5–5.3)
Sodium: 137 mmol/L (ref 135–146)
TOTAL PROTEIN: 6.6 g/dL (ref 6.1–8.1)
Total Bilirubin: 1.1 mg/dL (ref 0.2–1.2)

## 2015-08-16 LAB — LIPID PANEL
Cholesterol: 114 mg/dL — ABNORMAL LOW (ref 125–200)
HDL: 52 mg/dL (ref >=40)
LDL CALC: 46 mg/dL (ref <130)
Total CHOL/HDL Ratio: 2.2 Ratio (ref <=5.0)
Triglycerides: 78 mg/dL (ref <150)
VLDL: 16 mg/dL (ref <30)

## 2015-08-16 LAB — POCT GLYCOSYLATED HEMOGLOBIN (HGB A1C): Hemoglobin A1C: 6.6

## 2015-08-16 LAB — GLUCOSE, POCT (MANUAL RESULT ENTRY): POC Glucose: 184 mg/dl — AB (ref 70–99)

## 2015-08-16 NOTE — Progress Notes (Signed)
Patient ID: Danny [name], male    DOB: 12/31/1950, 64 y.o.   MRN: [id number]  PCP: Elyshia Kumagai, PA-C  Subjective:   Chief Complaint  Patient presents with  . Follow-up    3 mth f/u - pt is fasting     HPI Presents for evaluation of diabetes, HTN and hyperlipidemia.  Overall he's doing well. Both HTN and DM have been well controlled. Primary problem is Parkinson's, which is really frustrating to him, as he sees his health decline and his independence wane.   Fatigue. Just feels tired a lot more than he used to. Indigestion, sometimes several times a day. TUMS helps.  Feels like walking is getting worse, unsteady when standing  Wife thinks he's doing better Sees Dr. Rexene Alberts later this month.  Not getting any significant exercise. Doesn't feel like he can dance anymore, feels sluggish, can't keep up. Stumbling on words, too.  Review of Systems  Constitutional: Positive for fever and activity change. Negative for chills, diaphoresis, appetite change, fatigue and unexpected weight change.  HENT: Negative for congestion, drooling, trouble swallowing and voice change.   Eyes: Negative for pain and visual disturbance.  Respiratory: Negative for cough, choking, chest tightness and shortness of breath.   Cardiovascular: Negative for chest pain, palpitations and leg swelling.  Gastrointestinal: Positive for abdominal pain (indigestion). Negative for vomiting, diarrhea and constipation.  Genitourinary: Negative for dysuria, urgency, frequency, hematuria and difficulty urinating.  Musculoskeletal: Positive for gait problem. Negative for myalgias, joint swelling and arthralgias.  Skin: Positive for rash (itchy, LEFT ankle).  Allergic/Immunologic: Negative for environmental allergies.  Neurological: Positive for speech difficulty. Negative for dizziness, weakness, light-headedness and headaches.  Psychiatric/Behavioral: Negative for dysphoric mood and decreased concentration. The  patient is not nervous/anxious.        Patient Active Problem List   Diagnosis Date Noted  . Epididymal cyst 05/25/2015  . Parkinson disease 01/14/2014  . Obesity (BMI 30-39.9) 07/14/2013  . Confusional arousals 02/13/2013  . Disorder of ligament of right wrist 01/29/2012  . Diabetes mellitus type 2, uncomplicated 43/15/4008  . Mixed hyperlipidemia 10/09/2011  . OA (osteoarthritis) of knee 10/09/2011  . Essential hypertension, benign 10/09/2011     Prior to Admission medications   Medication Sig Start Date End Date Taking? Authorizing Provider  aspirin 81 MG tablet Take 81 mg by mouth daily.     Yes Historical Provider, MD  Cinnamon 500 MG capsule Take 500 mg by mouth daily.   Yes Historical Provider, MD  enalapril (VASOTEC) 2.5 MG tablet TAKE 1 TABLET BY MOUTH DAILY 08/10/15  Yes Tashana Haberl, PA-C  Ginger, Zingiber officinalis, 550 MG CAPS Take 550 mg by mouth daily.     Yes Historical Provider, MD  glipiZIDE (GLIPIZIDE XL) 2.5 MG 24 hr tablet Take 1 tablet by mouth  daily 03/14/15  Yes Wardell Honour, MD  Glucosamine-Chondroit-Vit C-Mn (GLUCOSAMINE CHONDR 1500 COMPLX PO) Take 1,500 mg by mouth daily.     Yes Historical Provider, MD  glucose blood test strip Use as instructed 09/15/13  Yes Ryan M Dunn, PA-C  Multiple Vitamin (MULTIVITAMIN) tablet Take 1 tablet by mouth daily.   Yes Historical Provider, MD  pramipexole (MIRAPEX) 0.5 MG tablet Take 1 tablet (0.25 mg  total) by mouth 3 (three)  times daily. 02/04/15  Yes [name], MD  rasagiline (AZILECT) 1 MG TABS tablet 1/2 pill daily in AM x 2 weeks, then 1 pill once daily in AM thereafter. 05/23/15  Yes [name], MD  simvastatin (ZOCOR) 40 MG tablet Take one-half tablet by  mouth daily 08/15/15  Yes Takela Varden, PA-C  sitaGLIPtin-metformin (JANUMET) 50-1000 MG per tablet TAKE 1 TABLET BY MOUTH TWICE A DAY WITH MEALS 08/15/15  Yes Giann Obara, PA-C  vitamin C (ASCORBIC ACID) 500 MG tablet Take 500 mg by mouth daily.     Yes  Historical Provider, MD     Allergies  Allergen Reactions  . Codeine Nausea And Vomiting       Objective:  Physical Exam  Constitutional: He is oriented to person, place, and time. Vital signs are normal. He appears well-developed and well-nourished. He is active and cooperative. No distress.  BP 114/74 mmHg  Pulse 90  Temp(Src) 99 F (37.2 C)  Resp 18  Ht 5' 9.5" (1.765 m)  Wt 224 lb 3.2 oz (101.696 kg)  BMI 32.64 kg/m2  SpO2 94%  HENT:  Head: Normocephalic and atraumatic.  Right Ear: Hearing normal.  Left Ear: Hearing normal.  flat facies  Eyes: Conjunctivae and lids are normal. No scleral icterus.  Neck: Normal range of motion. Neck supple. No thyromegaly present.  Cardiovascular: Normal rate, regular rhythm and normal heart sounds.   Pulses:      Radial pulses are 2+ on the right side, and 2+ on the left side.  Pulmonary/Chest: Effort normal and breath sounds normal.  Lymphadenopathy:       Head (right side): No tonsillar, no preauricular, no posterior auricular and no occipital adenopathy present.       Head (left side): No tonsillar, no preauricular, no posterior auricular and no occipital adenopathy present.    He has no cervical adenopathy.       Right: No supraclavicular adenopathy present.       Left: No supraclavicular adenopathy present.  Neurological: He is alert and oriented to person, place, and time. No sensory deficit.  Skin: Skin is warm, dry and intact. Lesion (1.5 round slightly raised lesion on the anterior LEFT ankle. Erythematous base with scale) noted. No rash noted. No cyanosis or erythema. Nails show no clubbing.  Psychiatric: His speech is normal and behavior is normal. Thought content normal. His mood appears not anxious. His affect is not angry, not blunt, not labile and not inappropriate. Depressed: flat affect, mildly sad.   Results for orders placed or performed in visit on 08/16/15  POCT glucose (manual entry)  Result Value Ref Range    POC Glucose 184 (A) 70 - 99 mg/dl  POCT glycosylated hemoglobin (Hb A1C)  Result Value Ref Range   Hemoglobin A1C 6.6            Assessment & Plan:   1. Diabetes mellitus type 2, uncomplicated Controlled. Continue current treatment. Encouraged exercise and healthy eating. Weight is stable, but above desired. - POCT glucose (manual entry) - POCT glycosylated hemoglobin (Hb A1C)  2. Essential hypertension, benign Controlled. Continue current treatment. - Comprehensive metabolic panel  3. Mixed hyperlipidemia Await lab results. - Lipid panel  4. Need for influenza vaccination He'll obtain this at work, as usual.  5. Indigestion Try OTC ranitidine QD-BID.  6. Skin lesion of left lower limb Try OTC topical steroid cream.  If no better, consider biopsy.  Return in about 3 months (around 11/16/2015) for re-evaluation of diabetes and cholesterol.   [name], PA-C Physician Assistant-Certified [facility]

## 2015-08-16 NOTE — Patient Instructions (Addendum)
I will contact you with your lab results as soon as they are available.   If you have not heard from me in 2 weeks, please contact me.  The fastest way to get your results is to register for My Chart (see the instructions on the last page of this printout).  Try putting a small amount of OTC hydrocortisone cream on the itchy spot on the LEFT ankle.   For the indigestion, I recommend OTC Zantac (ranitidine) 150 mg each morning, again in the evenings if needed.  Exercise! Use the sheets you got from the class. Consider dancing in your living room. Also consider joining an exercise class.

## 2015-08-17 ENCOUNTER — Encounter: Payer: Self-pay | Admitting: Physician Assistant

## 2015-08-18 ENCOUNTER — Encounter: Payer: Self-pay | Admitting: Physician Assistant

## 2015-08-29 ENCOUNTER — Encounter: Payer: Self-pay | Admitting: Neurology

## 2015-08-29 ENCOUNTER — Ambulatory Visit (INDEPENDENT_AMBULATORY_CARE_PROVIDER_SITE_OTHER): Payer: 59 | Admitting: Neurology

## 2015-08-29 VITALS — BP 118/76 | HR 80 | Resp 16 | Ht 69.5 in | Wt 225.0 lb

## 2015-08-29 DIAGNOSIS — G20 Parkinson's disease: Secondary | ICD-10-CM | POA: Diagnosis not present

## 2015-08-29 DIAGNOSIS — G4752 REM sleep behavior disorder: Secondary | ICD-10-CM

## 2015-08-29 DIAGNOSIS — H532 Diplopia: Secondary | ICD-10-CM

## 2015-08-29 NOTE — Patient Instructions (Addendum)
We will continue your medications at the current doses.   Use your stationary bike.   Try to drink more water.   Try to eat a healthy, balanced diet.

## 2015-08-29 NOTE — Progress Notes (Addendum)
Subjective:    Patient ID: Danny [name] is a 64 y.o. male.  HPI     Interim history:   Danny [name] is a very pleasant 64 year old right-handed gentleman with an underlying medical history of type 2 diabetes, osteoarthritis, hypertension, obesity, hyperlipidemia, and cataracts, who presents for follow-up consultation of his Parkinson's disease, with history of RBD and treatment with Mirapex low-dose. The patient is unaccompanied today. I last saw him on 04/25/2015, at which time he reported doing fairly well overall, but had noted smaller changes, including more slowness, difficulty with climbing ladders. He was advised by his boss to not climb ladders or walk over obstacles at work. Thankfully, his work environment is understanding. He had another business trip coming up and a colleague was to drive and do the climbing on the sites. He reported mild forgetfulness and some problems with depth perception. His diplopia had improved since he was given prism glasses by his ophthalmologist. I suggested, we try him on Azilect and keep his Mirapex the same.   Today, 08/29/2015: He reports doing fairly well. He has been able to tolerate the Azilect. His Mirapex is 0.5 mg 3 times a day. He works at [employer] most of the time. He does not always drink enough water. He has a good appetite. He has not fallen. He feels that his memory is stable. His REM behavior disorder is less frequent than before but still occurs. His wife is able to sleep in the same bed with him. He has not noticed any significant change in his symptoms. His double vision is under control with prisms in his glasses. He has had recent blurry vision and is going to see his ophthalmologist he states.  Previously:  I saw him on 02/04/2015, at which time he reported ongoing issues with diplopia, particularly at night while driving. He was still working full-time. He was still on low-dose Mirapex, 0.25 mg 3 times a day. He had no side  effects. He had had a sleep study which per his report did not show any OSA. I asked him to make an appointment with his eye doctor. I suggested an increase in Mirapex to 0.5 mg 3 times a day.   I first met him on 12/28/2014, at which time he reported that he was having more fatigue. However, he also had reduce his caffeine intake. He had no recent falls and mood stable. He was working full-time. He reported that he had intermittent double vision in the last 3 months. His PCP requested a sooner than scheduled appointment for diplopia. His exam was stable at the time and I suggested no new medication changes.   He has had some short-term memory issues. He works full-time. He is an Chief Financial Officer. He likes square dance but his wife has noted that he has had more problems with his nighttime driving and his squared hands. In the recent 3 months he has had some intermittent double vision at night. He has drooling at night. He rarely drinks alcohol and usually drinks 4-5 cups of coffee per day, occasional sodas. He quit smoking in 1978. Blood pressure is low for him today. It was recently noted to be trending lower and his blood pressure medication was reduced. He takes his cholesterol medication every other day.  He had a brain MRI without contrast on 11/04/2013: Normal MRI scan of the brain. Incidental findings of a chronic paranasal sinusitis with deviated nasal septum to the left.  He previously followed with Dr. Jim Like  and was last seen by him on 08/30/2014, at which time his Mirapex was kept at 0.25 mg 3 times a day. He reported no side effects, in particular no impulse control disorder. I reviewed Dr. Hazle Quant notes. He first met Dr. Janann Colonel on 10/26/13, at which time the patient reported a right hand tremor noticed over the previous 4-6 months. He had changes in his handwriting, slowness, some stiffness and changes in his walking.  His wife reported a longer standing history of REM behavior disorder. He  has been in outpatient physical therapy.   His Past Medical History Is Significant For: Past Medical History  Diagnosis Date  . Type II or unspecified type diabetes mellitus without mention of complication, not stated as uncontrolled   . Mixed hyperlipidemia   . OA (osteoarthritis) of knee     right  . Essential hypertension, benign   . Obesity, unspecified   . Cataract   . Parkinsons     His Past Surgical History Is Significant For: Past Surgical History  Procedure Laterality Date  . Knee arthroscopy      right  . Ganglion cyst excision  age 87 years    right  . Wrist surgery  04/2012    His Family History Is Significant For: Family History  Problem Relation Age of Onset  . Diabetes Mother   . Diabetes Sister   . Diabetes Daughter   . Cancer Father   . Heart disease Father     His Social History Is Significant For: Social History   Social History  . Marital Status: Married    Spouse Name: [name]  . Number of Children: 3  . Years of Education: 16   Occupational History  . Engineer    Social History Main Topics  . Smoking status: Former Smoker -- 1.50 packs/day for 15 years    Types: Cigarettes    Quit date: 12/17/1982  . Smokeless tobacco: Never Used     Comment: over 40 yrs  . Alcohol Use: No     Comment: very rare  . Drug Use: No  . Sexual Activity:    Partners: Female   Other Topics Concern  . None   Social History Narrative   Research officer, trade union, motorcycle rider.  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  He has 3 adult children.  His wife had 2 children, one of whom is deceased.   Patient is working full-time.   Patient has a Bachelor's degree.   Patient is right-handed.   Patient drinks 6 cups of coffee daily.    His Allergies Are:  Allergies  Allergen Reactions  . Codeine Nausea And Vomiting  :   His Current Medications Are:  Outpatient Encounter Prescriptions as of 08/29/2015  Medication Sig  . aspirin 81 MG tablet Take 81 mg by mouth daily.    .  Cinnamon 500 MG capsule Take 500 mg by mouth daily.  . enalapril (VASOTEC) 2.5 MG tablet TAKE 1 TABLET BY MOUTH DAILY  . Ginger, Zingiber officinalis, 550 MG CAPS Take 550 mg by mouth daily.    Marland Kitchen glipiZIDE (GLIPIZIDE XL) 2.5 MG 24 hr tablet Take 1 tablet by mouth  daily  . Glucosamine-Chondroit-Vit C-Mn (GLUCOSAMINE CHONDR 1500 COMPLX PO) Take 1,500 mg by mouth daily.    Marland Kitchen glucose blood test strip Use as instructed  . Multiple Vitamin (MULTIVITAMIN) tablet Take 1 tablet by mouth daily.  . pramipexole (MIRAPEX) 0.5 MG tablet Take 1 tablet (0.25 mg  total) by mouth 3 (  three)  times daily.  . rasagiline (AZILECT) 1 MG TABS tablet 1/2 pill daily in AM x 2 weeks, then 1 pill once daily in AM thereafter.  . simvastatin (ZOCOR) 40 MG tablet Take one-half tablet by  mouth daily  . sitaGLIPtin-metformin (JANUMET) 50-1000 MG per tablet TAKE 1 TABLET BY MOUTH TWICE A DAY WITH MEALS  . vitamin C (ASCORBIC ACID) 500 MG tablet Take 500 mg by mouth daily.     No facility-administered encounter medications on file as of 08/29/2015.  :  Review of Systems:  Out of a complete 14 point review of systems, all are reviewed and negative with the exception of these symptoms as listed below:   Review of Systems  Neurological:       No new concerns, patient states he is doing the same as last visit.  Patient feels like he is doing well on Azilect.     Objective:  Neurologic Exam  Physical Exam Physical Examination:   Filed Vitals:   08/29/15 1113  BP: 118/76  Pulse: 80  Resp: 16    General Examination: The patient is a very pleasant 64 y.o. male in no acute distress. He is calm and cooperative. He is in good spirits today. He does not have any diplopia currently.  HEENT: Normocephalic, atraumatic, pupils are equal, round and reactive to light and accommodation. Funduscopic exam is normal with sharp disc margins noted. He has prism eye glasses. Extraocular tracking shows mild saccadic breakdown without  nystagmus noted. There is limitation to upper gaze. There is mild decrease in eye blink rate. He had bilateral cataract repairs. Hearing is intact. Face is symmetric with mild facial masking and normal facial sensation. There is no lip, neck or jaw tremor. Neck is moderately rigid with intact passive ROM. There are no carotid bruits on auscultation. Oropharynx exam reveals moderate mouth dryness. No significant airway crowding is noted. Mallampati is class II. Tongue protrudes centrally and palate elevates symmetrically. There is no significant drooling.   Chest: is clear to auscultation without wheezing, rhonchi or crackles noted.  Heart: sounds are regular and normal without murmurs, rubs or gallops noted.   Abdomen: is soft, non-tender and non-distended with normal bowel sounds appreciated on auscultation.  Extremities: There is no pitting edema in the distal lower extremities bilaterally. Pedal pulses are intact.   Skin: is warm and dry with no trophic changes noted.   Musculoskeletal: exam reveals no obvious joint deformities, tenderness, joint swelling or erythema, with the exception of right wrist decrease in range of motion which is not new.  Neurologically:  Mental status: The patient is awake and alert, paying good  attention. He is able to completely provide the history. His wife provides details He is oriented to: person, place, time/date, situation, day of week, month of year and year. His memory, attention, language and knowledge are intact. There is no aphasia, agnosia, apraxia or anomia. There is a no significant degree of bradyphrenia. Speech is mildly hypophonic with no dysarthria noted. Mood is congruent and affect is normal.   Cranial nerves are as described above under HEENT exam. In addition, shoulder shrug is normal with equal shoulder height noted.  Motor exam: Normal bulk, and strength for age is noted. There are no dyskinesias.  Tone is mildly rigid with presence of  cogwheeling in the right upper extremity. There is overall mild bradykinesia. There is no drift or rebound.  There is a very slight intermittent right upper extremity resting tremor. He has  no postural or action tremor. Romberg is negative. Reflexes are 2+ throughout, 1+ in the ankles.  Fine motor skills exam: Finger taps are mild to moderately impaired on the right and Minimally impaired on the left. Hand movements are mildly impaired on the right and Minimally impaired on the left. RAP (rapid alternating patting) is Minimally impaired on the right and Not impaired on the left. Foot taps are mildly impaired on the right and Minimally impaired on the left. Foot agility (in the form of heel stomping) is Minimally to mildly impaired on the right and Not impaired on the left.    Cerebellar testing shows no dysmetria or intention tremor on finger to nose testing. Heel to shin is unremarkable bilaterally. There is no truncal or gait ataxia.   Sensory exam is intact to light touch in the upper and lower extremities.   Gait, station and balance: He stands up from the seated position with very mild difficulty and does need to push up with His hands. He needs no assistance. No veering to one side is noted. He is not noted to lean to the side. Posture is mild to moderately stooped, slightly worse. Stance is slightly wide-based. He walks with decrease in stride length and pace and decrseased arm swing on the right. He turns in 3 steps. Tandem walk is not possible safely. Balance is mildly impaired.  Assessment and Plan:   In summary, Danny [name] is a very pleasant 65 year old male with an underlying medical history of type 2 diabetes, osteoarthritis, hypertension, obesity, hyperlipidemia, and cataracts, who presents for follow-up consultation of his right-sided predominant Parkinson's disease with mildly progressive symptoms. He has resolution of his diplopia since he was given a prism eye glasses. I suggested  we continue with Azilect 1 mg once daily and Mirapex 0.5 mg 3 times a day. He was agreeable with this. For the most part, his exam is stable. I again encouraged him to exercise regularly, avoid climbing on stairs and working at heights, and he feels confident in his driving for local roads and shorter distances. He tries to avoid longer distances and driving after dark. I will see him back in 6 months, sooner if the need arises. He is encouraged to call or email with any interim questions or concerns. I answered all his questions today and he was in agreement. He had a recent visit with his primary care provider and had blood work on 08/16/2015 which I reviewed. His A1c was 6.6, his cholesterol panel was at goal. I spent 20 minutes in total face-to-face time with the patient, more than 50% of which was spent in counseling and coordination of care, reviewing test results, reviewing medication and discussing or reviewing the diagnosis of PD, its prognosis and treatment options.

## 2015-10-10 ENCOUNTER — Other Ambulatory Visit: Payer: Self-pay | Admitting: Neurology

## 2015-10-10 ENCOUNTER — Other Ambulatory Visit: Payer: Self-pay | Admitting: Physician Assistant

## 2015-10-12 ENCOUNTER — Ambulatory Visit (INDEPENDENT_AMBULATORY_CARE_PROVIDER_SITE_OTHER): Payer: 59 | Admitting: Physician Assistant

## 2015-10-12 ENCOUNTER — Encounter: Payer: Self-pay | Admitting: Physician Assistant

## 2015-10-12 VITALS — BP 110/70 | HR 98 | Temp 98.5°F | Resp 20 | Ht 71.0 in | Wt 229.4 lb

## 2015-10-12 DIAGNOSIS — R361 Hematospermia: Secondary | ICD-10-CM

## 2015-10-12 DIAGNOSIS — N50819 Testicular pain, unspecified: Secondary | ICD-10-CM | POA: Diagnosis not present

## 2015-10-12 LAB — POC MICROSCOPIC URINALYSIS (UMFC): Mucus: ABSENT

## 2015-10-12 LAB — POCT URINALYSIS DIP (MANUAL ENTRY)
BILIRUBIN UA: NEGATIVE
Bilirubin, UA: NEGATIVE
Leukocytes, UA: NEGATIVE
Nitrite, UA: NEGATIVE
PROTEIN UA: NEGATIVE
RBC UA: NEGATIVE
SPEC GRAV UA: 1.02
UROBILINOGEN UA: 0.2
pH, UA: 5

## 2015-10-12 NOTE — Progress Notes (Signed)
Patient ID: Danny [name], male    DOB: 24-Jan-1951, 65 y.o.   MRN: [id number]  PCP: Lindyn Vossler, PA-C  Subjective:   Chief Complaint  Patient presents with  . OTHER    prostate    HPI Presents for evaluation of testicular pain x 1 week.  He first noticed that his testicles felt sore when sitting 7 days ago. He read about associated symptoms and causes online, and then noticed blood in the semen. The pain has improved, but remains "uncomfortable." He is concerned that he has an infection or cancer, since he recalls no trauma to the area.  No penile discharge. Sexually monogamous with his wife, who is present today. No urinary urgency, frequency or burning. No hematuria. No diarrhea, constipation. No nausea or vomiting. No fever or chills. No swollen glands.  Scrotal US was performed 05/2015 to evaluate LEFT scrotal mass, revealing Multiple left epididymal cystic areas, likely spermatoceles, the largest 1.8 cm.      Review of Systems As above.    Patient Active Problem List   Diagnosis Date Noted  . Epididymal cyst 05/25/2015  . Parkinson disease ([place]) 01/14/2014  . Obesity (BMI 30-39.9) 07/14/2013  . Confusional arousals 02/13/2013  . Disorder of ligament of right wrist 01/29/2012  . Diabetes mellitus type 2, uncomplicated ([place]) 28/41/3244  . Mixed hyperlipidemia 10/09/2011  . OA (osteoarthritis) of knee 10/09/2011  . Essential hypertension, benign 10/09/2011     Prior to Admission medications   Medication Sig Start Date End Date Taking? Authorizing Provider  aspirin 81 MG tablet Take 81 mg by mouth daily.     Yes Historical Provider, MD  Cinnamon 500 MG capsule Take 500 mg by mouth daily.   Yes Historical Provider, MD  enalapril (VASOTEC) 2.5 MG tablet TAKE 1 TABLET BY MOUTH DAILY 08/10/15  Yes Naomi Fitton, PA-C  Ginger, Zingiber officinalis, 550 MG CAPS Take 550 mg by mouth daily.     Yes Historical Provider, MD  glipiZIDE (GLUCOTROL XL) 2.5 MG 24 hr  tablet TAKE 1 TABLET BY MOUTH DAILY   "OFFICE VISIT NEEDED FOR REFILLS" 10/11/15  Yes Cyruss Arata, PA-C  Glucosamine-Chondroit-Vit C-Mn (GLUCOSAMINE CHONDR 1500 COMPLX PO) Take 1,500 mg by mouth daily.     Yes Historical Provider, MD  glucose blood test strip Use as instructed 09/15/13  Yes Ryan M Dunn, PA-C  Multiple Vitamin (MULTIVITAMIN) tablet Take 1 tablet by mouth daily.   Yes Historical Provider, MD  pramipexole (MIRAPEX) 0.5 MG tablet Take 1 tablet by mouth 3  times daily 10/10/15  Yes [name], MD  rasagiline (AZILECT) 1 MG TABS tablet 1/2 pill daily in AM x 2 weeks, then 1 pill once daily in AM thereafter. 05/23/15  Yes [name], MD  simvastatin (ZOCOR) 40 MG tablet Take one-half tablet by  mouth daily 08/15/15  Yes Adley Mazurowski, PA-C  sitaGLIPtin-metformin (JANUMET) 50-1000 MG per tablet TAKE 1 TABLET BY MOUTH TWICE A DAY WITH MEALS 08/15/15  Yes Ritvik Mczeal, PA-C  vitamin C (ASCORBIC ACID) 500 MG tablet Take 500 mg by mouth daily.     Yes Historical Provider, MD     Allergies  Allergen Reactions  . Codeine Nausea And Vomiting       Objective:  Physical Exam  Constitutional: He is oriented to person, place, and time. Vital signs are normal. He appears well-developed and well-nourished. He is active and cooperative. No distress.  BP 110/70 mmHg  Pulse 98  Temp(Src) 98.5 F (36.9 C) (Oral)  Resp 20  Ht 5\' 11"  (1.803 m)  Wt 229 lb 6.4 oz (104.055 kg)  BMI 32.01 kg/m2  SpO2 98%  HENT:  Head: Normocephalic and atraumatic.  Right Ear: Hearing normal.  Left Ear: Hearing normal.  Eyes: Conjunctivae are normal. No scleral icterus.  Neck: Normal range of motion. Neck supple. No thyromegaly present.  Cardiovascular: Normal rate, regular rhythm and normal heart sounds.   Pulses:      Radial pulses are 2+ on the right side, and 2+ on the left side.  Pulmonary/Chest: Effort normal and breath sounds normal.  Abdominal: Hernia confirmed negative in the right inguinal area  and confirmed negative in the left inguinal area.  Genitourinary: Rectum normal and prostate normal. Rectal exam shows no external hemorrhoid, no internal hemorrhoid, no fissure, no mass, no tenderness and anal tone normal. Prostate is not enlarged and not tender. Right testis shows tenderness. Right testis shows no mass and no swelling. Right testis is descended. Left testis shows mass (known spermatocele) and tenderness. Left testis shows no swelling. Left testis is descended. Circumcised. No phimosis, paraphimosis, hypospadias, penile erythema or penile tenderness. No discharge found.  Lymphadenopathy:       Head (right side): No tonsillar, no preauricular, no posterior auricular and no occipital adenopathy present.       Head (left side): No tonsillar, no preauricular, no posterior auricular and no occipital adenopathy present.    He has no cervical adenopathy.       Right: No inguinal and no supraclavicular adenopathy present.       Left: No inguinal and no supraclavicular adenopathy present.  Neurological: He is alert and oriented to person, place, and time. No sensory deficit.  Skin: Skin is warm, dry and intact. No rash noted. No cyanosis or erythema. Nails show no clubbing.  Psychiatric: His speech is normal and behavior is normal. Thought content normal. His mood appears not anxious. He does not exhibit a depressed mood.  Flat affect, consistent with parkinsonism.   Results for orders placed or performed in visit on 10/12/15  POCT urinalysis dipstick  Result Value Ref Range   Color, UA yellow yellow   Clarity, UA clear clear   Glucose, UA =100 (A) negative   Bilirubin, UA negative negative   Ketones, POC UA negative negative   Spec Grav, UA 1.020    Blood, UA negative negative   pH, UA 5.0    Protein Ur, POC negative negative   Urobilinogen, UA 0.2    Nitrite, UA Negative Negative   Leukocytes, UA Negative Negative  POCT Microscopic Urinalysis ([facility])  Result Value Ref Range    WBC,UR,HPF,POC None None WBC/hpf   RBC,UR,HPF,POC None None RBC/hpf   Bacteria None None, Too numerous to count   Mucus Absent Absent   Epithelial Cells, UR Per Microscopy Few (A) None, Too numerous to count cells/hpf           Assessment & Plan:   1. Hematospermia 2. Testicular pain No evidence of infection. No systemic symptoms. Await UCx. Repeat scrotal US. If normal, and symptoms persist, plan urology referral. - POCT urinalysis dipstick - POCT Microscopic Urinalysis ([facility]) - Urine culture - US Scrotum; Future   [name], PA-C Physician Assistant-Certified [facility]

## 2015-10-12 NOTE — Patient Instructions (Signed)
I will contact you with the results of the urine specimen. If there is no infection, I'll go ahead and order an ultrasound.

## 2015-10-13 ENCOUNTER — Encounter: Payer: Self-pay | Admitting: Physician Assistant

## 2015-10-14 ENCOUNTER — Encounter: Payer: Self-pay | Admitting: Physician Assistant

## 2015-10-14 LAB — URINE CULTURE
COLONY COUNT: NO GROWTH
ORGANISM ID, BACTERIA: NO GROWTH

## 2015-10-17 ENCOUNTER — Other Ambulatory Visit: Payer: Self-pay | Admitting: Physician Assistant

## 2015-10-17 DIAGNOSIS — N50819 Testicular pain, unspecified: Secondary | ICD-10-CM

## 2015-10-18 ENCOUNTER — Ambulatory Visit
Admission: RE | Admit: 2015-10-18 | Discharge: 2015-10-18 | Disposition: A | Discharge status: Home / Self Care | Payer: 59 | Source: Ambulatory Visit | Attending: Physician Assistant | Admitting: Physician Assistant

## 2015-10-18 ENCOUNTER — Other Ambulatory Visit: Payer: Self-pay | Admitting: Physician Assistant

## 2015-10-18 DIAGNOSIS — N50819 Testicular pain, unspecified: Secondary | ICD-10-CM

## 2015-11-18 ENCOUNTER — Encounter: Payer: Self-pay | Admitting: Physician Assistant

## 2015-11-18 DIAGNOSIS — R361 Hematospermia: Secondary | ICD-10-CM

## 2015-11-29 ENCOUNTER — Ambulatory Visit: Payer: 59 | Admitting: Physician Assistant

## 2015-12-06 ENCOUNTER — Ambulatory Visit: Payer: 59 | Admitting: Physician Assistant

## 2015-12-13 ENCOUNTER — Ambulatory Visit (INDEPENDENT_AMBULATORY_CARE_PROVIDER_SITE_OTHER): Payer: 59 | Admitting: Physician Assistant

## 2015-12-13 ENCOUNTER — Encounter: Payer: Self-pay | Admitting: Physician Assistant

## 2015-12-13 VITALS — BP 111/72 | HR 98 | Temp 97.7°F | Resp 16 | Ht 70.0 in | Wt 225.0 lb

## 2015-12-13 DIAGNOSIS — E119 Type 2 diabetes mellitus without complications: Secondary | ICD-10-CM

## 2015-12-13 DIAGNOSIS — E782 Mixed hyperlipidemia: Secondary | ICD-10-CM | POA: Diagnosis not present

## 2015-12-13 DIAGNOSIS — I10 Essential (primary) hypertension: Secondary | ICD-10-CM

## 2015-12-13 DIAGNOSIS — K5901 Slow transit constipation: Secondary | ICD-10-CM | POA: Diagnosis not present

## 2015-12-13 LAB — GLUCOSE, POCT (MANUAL RESULT ENTRY): POC Glucose: 156 mg/dl — AB (ref 70–99)

## 2015-12-13 LAB — COMPREHENSIVE METABOLIC PANEL
ALBUMIN: 4 g/dL (ref 3.6–5.1)
ALT: 32 U/L (ref 9–46)
AST: 19 U/L (ref 10–35)
Alkaline Phosphatase: 49 U/L (ref 40–115)
BUN: 24 mg/dL (ref 7–25)
CHLORIDE: 106 mmol/L (ref 98–110)
CO2: 22 mmol/L (ref 20–31)
Calcium: 9.5 mg/dL (ref 8.6–10.3)
Creat: 1.09 mg/dL (ref 0.70–1.25)
Glucose, Bld: 141 mg/dL — ABNORMAL HIGH (ref 65–99)
Potassium: 4.1 mmol/L (ref 3.5–5.3)
Sodium: 139 mmol/L (ref 135–146)
TOTAL PROTEIN: 6.6 g/dL (ref 6.1–8.1)
Total Bilirubin: 0.9 mg/dL (ref 0.2–1.2)

## 2015-12-13 LAB — LIPID PANEL
CHOL/HDL RATIO: 2.3 ratio (ref <=5.0)
CHOLESTEROL: 115 mg/dL — AB (ref 125–200)
HDL: 51 mg/dL (ref >=40)
LDL CALC: 48 mg/dL (ref <130)
TRIGLYCERIDES: 82 mg/dL (ref <150)
VLDL: 16 mg/dL (ref <30)

## 2015-12-13 LAB — POCT GLYCOSYLATED HEMOGLOBIN (HGB A1C): HEMOGLOBIN A1C: 7.6

## 2015-12-13 MED ORDER — GLIPIZIDE ER 5 MG PO TB24: ORAL_TABLET | ORAL | Status: DC

## 2015-12-13 NOTE — Progress Notes (Signed)
Patient ID: Danny [name], male    DOB: 11-18-1951, 64 y.o.   MRN: [id number]  PCP: Dakin Madani, PA-C  Subjective:   Chief Complaint  Patient presents with  . Follow-up  . Diabetes    HPI Presents for evaluation of diabetes.  Diabetes has been well controlled. He tolerates medication without difficulty. No longer monitors his sugars at home.  Only concern today is increasing constipation. Stools are hard and require straining. Sometimes there is blood on the tissue when he wipes. No pain with defecation.     Review of Systems  Constitutional: Negative for activity change, appetite change, fatigue and unexpected weight change.  HENT: Negative for congestion, dental problem, ear pain, hearing loss, mouth sores, postnasal drip, rhinorrhea, sneezing, sore throat, tinnitus and trouble swallowing.   Eyes: Negative for photophobia, pain, redness and visual disturbance.  Respiratory: Negative for cough, chest tightness and shortness of breath.   Cardiovascular: Negative for chest pain, palpitations and leg swelling.  Gastrointestinal: Positive for constipation and anal bleeding. Negative for nausea, vomiting, abdominal pain, diarrhea, blood in stool, abdominal distention and rectal pain.  Genitourinary: Negative for dysuria, urgency, frequency and hematuria.  Musculoskeletal: Negative for myalgias, arthralgias, gait problem and neck stiffness.  Skin: Negative for rash.  Neurological: Negative for dizziness, speech difficulty, weakness, light-headedness, numbness and headaches.  Hematological: Negative for adenopathy.  Psychiatric/Behavioral: Negative for confusion and sleep disturbance. The patient is not nervous/anxious.        Patient Active Problem List   Diagnosis Date Noted  . Hematospermia 11/18/2015  . Epididymal cyst 05/25/2015  . Parkinson disease ([place]) 01/14/2014  . Obesity (BMI 30-39.9) 07/14/2013  . Confusional arousals 02/13/2013  . Disorder of ligament of  right wrist 01/29/2012  . Diabetes mellitus type 2, uncomplicated ([place]) [id number]  . Mixed hyperlipidemia 10/09/2011  . OA (osteoarthritis) of knee 10/09/2011  . Essential hypertension, benign 10/09/2011     Prior to Admission medications   Medication Sig Start Date End Date Taking? Authorizing Provider  aspirin 81 MG tablet Take 81 mg by mouth daily.     Yes Historical Provider, MD  Cinnamon 500 MG capsule Take 500 mg by mouth daily.   Yes Historical Provider, MD  enalapril (VASOTEC) 2.5 MG tablet TAKE 1 TABLET BY MOUTH DAILY 08/10/15  Yes Emmabelle Fear, PA-C  Ginger, Zingiber officinalis, 550 MG CAPS Take 550 mg by mouth daily.     Yes Historical Provider, MD  glipiZIDE (GLUCOTROL XL) 2.5 MG 24 hr tablet TAKE 1 TABLET BY MOUTH DAILY   "OFFICE VISIT NEEDED FOR REFILLS" 10/11/15  Yes Raye Slyter, PA-C  Glucosamine-Chondroit-Vit C-Mn (GLUCOSAMINE CHONDR 1500 COMPLX PO) Take 1,500 mg by mouth daily.     Yes Historical Provider, MD  glucose blood test strip Use as instructed 09/15/13  Yes Ryan M Dunn, PA-C  Multiple Vitamin (MULTIVITAMIN) tablet Take 1 tablet by mouth daily.   Yes Historical Provider, MD  pramipexole (MIRAPEX) 0.5 MG tablet Take 1 tablet by mouth 3  times daily 10/10/15  Yes [name], MD  rasagiline (AZILECT) 1 MG TABS tablet 1/2 pill daily in AM x 2 weeks, then 1 pill once daily in AM thereafter. 05/23/15  Yes [name], MD  simvastatin (ZOCOR) 40 MG tablet Take one-half tablet by  mouth daily 08/15/15  Yes Reef Achterberg, PA-C  sitaGLIPtin-metformin (JANUMET) 50-1000 MG per tablet TAKE 1 TABLET BY MOUTH TWICE A DAY WITH MEALS 08/15/15  Yes [name], PA-C     Allergies  Allergen  Reactions  . Codeine Nausea And Vomiting       Objective:  Physical Exam  Constitutional: He is oriented to person, place, and time. He appears well-developed and well-nourished. He is active and cooperative. No distress.  BP 111/72 mmHg  Pulse 98  Temp(Src) 97.7 F (36.5 C)   Resp 16  Ht 5\' 10"  (1.778 m)  Wt 225 lb (102.059 kg)  BMI 32.28 kg/m2  HENT:  Head: Normocephalic and atraumatic.  Right Ear: Hearing normal.  Left Ear: Hearing normal.  Eyes: Conjunctivae are normal. No scleral icterus.  Neck: Normal range of motion. Neck supple. No thyromegaly present.  Cardiovascular: Normal rate, regular rhythm and normal heart sounds.   Pulses:      Radial pulses are 2+ on the right side, and 2+ on the left side.  Pulmonary/Chest: Effort normal and breath sounds normal.  Lymphadenopathy:       Head (right side): No tonsillar, no preauricular, no posterior auricular and no occipital adenopathy present.       Head (left side): No tonsillar, no preauricular, no posterior auricular and no occipital adenopathy present.    He has no cervical adenopathy.       Right: No supraclavicular adenopathy present.       Left: No supraclavicular adenopathy present.  Neurological: He is alert and oriented to person, place, and time. No sensory deficit.  Skin: Skin is warm, dry and intact. No rash noted. No cyanosis or erythema. Nails show no clubbing.  Psychiatric: His speech is normal and behavior is normal. His mood appears not anxious. His affect is blunt. His affect is not angry, not labile and not inappropriate. He does not exhibit a depressed mood.       Results for orders placed or performed in visit on 12/13/15  POCT glucose (manual entry)  Result Value Ref Range   POC Glucose 156 (A) 70 - 99 mg/dl  POCT glycosylated hemoglobin (Hb A1C)  Result Value Ref Range   Hemoglobin A1C 7.6        Assessment & Plan:   1. Type 2 diabetes mellitus without complication, without long-term current use of insulin (HCC) Controlled, but A1C is up 1 point from last check. He'll work on healthy eating and getting more exercise, and will increase glipizide ER from 2.5 mg to 5 mg daily. - POCT glucose (manual entry) - POCT glycosylated hemoglobin (Hb A1C) - Comprehensive metabolic  panel - glipiZIDE (GLUCOTROL XL) 5 MG 24 hr tablet; TAKE 1 TABLET BY MOUTH DAILY  Dispense: 90 tablet; Refill: 3  2. Essential hypertension, benign Controlled. - Comprehensive metabolic panel - Lipid panel  3. Mixed hyperlipidemia Await lab results. - Lipid panel  4. Slow transit constipation OTC Miralax. Increased dietary fiber. Add fiber supplement if needed. Increase oral hydration and physical activity.  Return in about 3 months (around 03/12/2016) for re-evaluation of diabetes, blood pressure and cholesterol.    [name], PA-C Physician Assistant-Certified [facility]

## 2015-12-13 NOTE — Patient Instructions (Signed)
Try Miralax for constipation.  To help reduce constipation and promote bowel health, 1. Drink at least 64 ounces of water each day; 2. Eat plenty of fiber (fruits, vegetables, whole grains, legumes) 3. Get plenty of physical activity  If needed, use a stool softener (docusate) or an osmotic laxative (like Miralax) each day, or as needed.

## 2016-02-12 ENCOUNTER — Other Ambulatory Visit: Payer: Self-pay | Admitting: Physician Assistant

## 2016-02-13 ENCOUNTER — Encounter: Payer: Self-pay | Admitting: Physician Assistant

## 2016-02-13 DIAGNOSIS — E119 Type 2 diabetes mellitus without complications: Secondary | ICD-10-CM

## 2016-02-13 NOTE — Telephone Encounter (Signed)
Please call the pharmacy.  The patient will be out of the Glipizide before they will allow him to refill. Please discover what the issue is. We can send a supply to a local pharmacy if needed to bridge the gap.

## 2016-02-14 NOTE — Telephone Encounter (Signed)
I called [facility] and they reported that they shipped #[phone] to pt on 12/13/15, so he should have enough until the end of March. Called and [facility] for pt asking if he may have another bottle of glipizide left somewhere. Asked him to call me back if he needs me to send some in locally or help in some other way.

## 2016-02-20 ENCOUNTER — Telehealth: Payer: Self-pay

## 2016-02-20 MED ORDER — GLIPIZIDE ER 5 MG PO TB24: ORAL_TABLET | ORAL | Status: DC

## 2016-02-20 NOTE — Telephone Encounter (Signed)
Pt called back and stated that he discovered the problem is that Chelle instr'd him to start taking 2 tabs QD and he has run out early since dose was doubled. Chelle wrote/sent in new Rx for 5 mg on 12/13/15, but pt advised that [facility] never sent him that dose Rx. I advised pt re-sending in new Rx for 5 mg tabs w/same RFs as written by Chelle.

## 2016-02-20 NOTE — Telephone Encounter (Signed)
Opened in error, duplicate.

## 2016-02-21 ENCOUNTER — Other Ambulatory Visit: Payer: Self-pay

## 2016-02-21 DIAGNOSIS — E119 Type 2 diabetes mellitus without complications: Secondary | ICD-10-CM

## 2016-02-21 MED ORDER — ENALAPRIL MALEATE 2.5 MG PO TABS: ORAL_TABLET | ORAL | Status: DC

## 2016-02-21 MED ORDER — GLIPIZIDE ER 5 MG PO TB24: ORAL_TABLET | ORAL | Status: DC

## 2016-02-27 ENCOUNTER — Encounter: Payer: Self-pay | Admitting: Neurology

## 2016-02-27 ENCOUNTER — Ambulatory Visit (INDEPENDENT_AMBULATORY_CARE_PROVIDER_SITE_OTHER): Payer: 59 | Admitting: Neurology

## 2016-02-27 VITALS — BP 116/78 | HR 82 | Resp 18 | Ht 70.0 in | Wt 226.0 lb

## 2016-02-27 DIAGNOSIS — G4752 REM sleep behavior disorder: Secondary | ICD-10-CM

## 2016-02-27 DIAGNOSIS — G20 Parkinson's disease: Secondary | ICD-10-CM | POA: Diagnosis not present

## 2016-02-27 DIAGNOSIS — G20A1 Parkinson's disease without dyskinesia, without mention of fluctuations: Secondary | ICD-10-CM

## 2016-02-27 MED ORDER — RASAGILINE MESYLATE 1 MG PO TABS: ORAL_TABLET | ORAL | Status: DC

## 2016-02-27 MED ORDER — PRAMIPEXOLE DIHYDROCHLORIDE 0.75 MG PO TABS: 0.7500 mg | ORAL_TABLET | Freq: Three times a day (TID) | ORAL | Status: DC

## 2016-02-27 NOTE — Patient Instructions (Signed)
We will continue with Azilect 1 mg once daily.  We will increase your Mirapex to 0.75 mg three times a day. You can use the 0.5 mg and take 1 1/2 tabs three times a day and I will then increase your prescription to 0.75 mg strength pills, 1 pill three time a day.  We will monitor your memory and mood.

## 2016-02-27 NOTE — Progress Notes (Signed)
Subjective:    Patient ID: Danny [name] is a 65 y.o. male.  HPI     Interim history:   Danny [name] is a very pleasant 65 year old right-handed gentleman with an underlying medical history of type 2 diabetes, osteoarthritis, hypertension, obesity, hyperlipidemia, and cataracts, who presents for follow-up consultation of his Parkinson's disease, with history of RBD and treatment with Mirapex low-dose. The patient is accompanied by his wife today. I last saw him on 08/29/2015 at which time he reported doing fairly well. He was able to tolerate Azilect. His Mirapex was 0.5 mg 3 times a day. He was working at the computer most of the time. He was not always drinking enough water. He had a good appetite. He felt his memory was stable. RBD was less frequent but still occurring. His wife was able to sleep in the same bed with him. He had no recent falls. He had some recent blurry vision was going to see his ophthalmologist. We mutually agreed to continue his current medication regimen.  Today, 02/27/2016: He reports Noticing more difficulty with his walking. He has become slower. His wife has noted that it takes him longer to get ready. He drags his feet sometimes. He does not swing his arms. He still works full-time as a Land but avoids stairs and climbing at heights. When he has to go out of town he does not have to drive and a colleague is within typically. He has a very Licensed conveyancer and good work environment. He avoids driving at night and longer distances. His wife also works full-time as a Theme park manager for one of the retirement communities. They eat out a lot. His latest A1c in December 2016 was a little up at 7.6. He has his checkup scheduled next month for his primary care and with ophthalmology. Memory is stable. He has mild forgetfulness. Mood is stable, he has mild irritability at times but nothing major. RBD occurs but infrequently and nothing violent. He has not  fallen.  Previously:  I saw him on 04/25/2015, at which time he reported doing fairly well overall, but had noted smaller changes, including more slowness, difficulty with climbing ladders. He was advised by his boss to not climb ladders or walk over obstacles at work. Thankfully, his work environment is understanding. He had another business trip coming up and a colleague was to drive and do the climbing on the sites. He reported mild forgetfulness and some problems with depth perception. His diplopia had improved since he was given prism glasses by his ophthalmologist. I suggested, we try him on Azilect and keep his Mirapex the same.   I saw him on 02/04/2015, at which time he reported ongoing issues with diplopia, particularly at night while driving. He was still working full-time. He was still on low-dose Mirapex, 0.25 mg 3 times a day. He had no side effects. He had had a sleep study which per his report did not show any OSA. I asked him to make an appointment with his eye doctor. I suggested an increase in Mirapex to 0.5 mg 3 times a day.   I first met him on 12/28/2014, at which time he reported that he was having more fatigue. However, he also had reduce his caffeine intake. He had no recent falls and mood stable. He was working full-time. He reported that he had intermittent double vision in the last 3 months. His PCP requested a sooner than scheduled appointment for diplopia. His exam was stable at the  time and I suggested no new medication changes.  He has had some short-term memory issues. He works full-time. He is an Chief Financial Officer. He likes square dance but his wife has noted that he has had more problems with his nighttime driving and his squared hands. In the recent 3 months he has had some intermittent double vision at night. He has drooling at night. He rarely drinks alcohol and usually drinks 4-5 cups of coffee per day, occasional sodas. He quit smoking in 1978. Blood pressure is low for him  today. It was recently noted to be trending lower and his blood pressure medication was reduced. He takes his cholesterol medication every other day.  He had a brain MRI without contrast on 11/04/2013: Normal MRI scan of the brain. Incidental findings of a chronic paranasal sinusitis with deviated nasal septum to the left.  He previously followed with Dr. Jim Like and was last seen by him on 08/30/2014, at which time his Mirapex was kept at 0.25 mg 3 times a day. He reported no side effects, in particular no impulse control disorder. I reviewed Dr. Hazle Quant notes. He first met Dr. Janann Colonel on 10/26/13, at which time the patient reported a right hand tremor noticed over the previous 4-6 months. He had changes in his handwriting, slowness, some stiffness and changes in his walking.  His wife reported a longer standing history of REM behavior disorder. He has been in outpatient physical therapy.  His Past Medical History Is Significant For: Past Medical History  Diagnosis Date  . Type II or unspecified type diabetes mellitus without mention of complication, not stated as uncontrolled   . Mixed hyperlipidemia   . OA (osteoarthritis) of knee     right  . Essential hypertension, benign   . Obesity, unspecified   . Cataract   . Parkinsons ([place])     His Past Surgical History Is Significant For: Past Surgical History  Procedure Laterality Date  . Knee arthroscopy      right  . Ganglion cyst excision  age 37 years    right  . Wrist surgery  04/2012    His Family History Is Significant For: Family History  Problem Relation Age of Onset  . Diabetes Mother   . Diabetes Sister   . Diabetes Daughter   . Cancer Father   . Heart disease Father     His Social History Is Significant For: Social History   Social History  . Marital Status: Married    Spouse Name: [name]  . Number of Children: 3  . Years of Education: 16   Occupational History  . Engineer    Social History Main Topics  .  Smoking status: Former Smoker -- 1.50 packs/day for 15 years    Types: Cigarettes    Quit date: 12/17/1982  . Smokeless tobacco: Never Used     Comment: over 40 yrs  . Alcohol Use: No     Comment: very rare  . Drug Use: No  . Sexual Activity:    Partners: Female   Other Topics Concern  . None   Social History Narrative   Research officer, trade union, motorcycle rider.  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  He has 3 adult children.  His wife had 2 children, one of whom is deceased.   Patient is working full-time.   Patient has a Bachelor's degree.   Patient is right-handed.   Patient drinks 6 cups of coffee daily.    His Allergies Are:  Allergies  Allergen Reactions  . Codeine Nausea And Vomiting  :   His Current Medications Are:  Outpatient Encounter Prescriptions as of 02/27/2016  Medication Sig  . aspirin 81 MG tablet Take 81 mg by mouth daily.    . Cinnamon 500 MG capsule Take 500 mg by mouth daily.  . enalapril (VASOTEC) 2.5 MG tablet Take 1 tablet by mouth  daily  . Ginger, Zingiber officinalis, 550 MG CAPS Take 550 mg by mouth daily.    Marland Kitchen glipiZIDE (GLUCOTROL XL) 5 MG 24 hr tablet TAKE 1 TABLET BY MOUTH DAILY  . Glucosamine-Chondroit-Vit C-Mn (GLUCOSAMINE CHONDR 1500 COMPLX PO) Take 1,500 mg by mouth daily.    Marland Kitchen glucose blood test strip Use as instructed  . Multiple Vitamin (MULTIVITAMIN) tablet Take 1 tablet by mouth daily.  . pramipexole (MIRAPEX) 0.5 MG tablet Take 1 tablet by mouth 3  times daily  . rasagiline (AZILECT) 1 MG TABS tablet 1/2 pill daily in AM x 2 weeks, then 1 pill once daily in AM thereafter.  . simvastatin (ZOCOR) 40 MG tablet Take one-half tablet by  mouth daily  . sitaGLIPtin-metformin (JANUMET) 50-1000 MG per tablet TAKE 1 TABLET BY MOUTH TWICE A DAY WITH MEALS   No facility-administered encounter medications on file as of 02/27/2016.  :  Review of Systems:  Out of a complete 14 point review of systems, all are reviewed and negative with the exception of these  symptoms as listed below:   Review of Systems  Neurological:       Wife feels like patient is "slower".     Objective:  Neurologic Exam  Physical Exam Physical Examination:   Filed Vitals:   02/27/16 0827  BP: 116/78  Pulse: 82  Resp: 18    General Examination: The patient is a very pleasant 65 y.o. male in no acute distress. He is calm and cooperative. He is in good spirits today. He does not have any diplopia currently.  HEENT: Normocephalic, atraumatic, pupils are equal, round and reactive to light and accommodation. Funduscopic exam is normal with sharp disc margins noted. He has prism eye glasses. Extraocular tracking shows mild saccadic breakdown without nystagmus noted. There is limitation to upper gaze. There is mild decrease in eye blink rate. He had bilateral cataract repairs. Hearing is intact. Face is symmetric with mild facial masking and normal facial sensation. There is no lip, neck or jaw tremor. Neck is mild to moderately rigid with intact passive ROM. There are no carotid bruits on auscultation. Oropharynx exam reveals moderate mouth dryness. No significant airway crowding is noted. Mallampati is class II. Tongue protrudes centrally and palate elevates symmetrically. There is no drooling.   Chest: is clear to auscultation without wheezing, rhonchi or crackles noted.  Heart: sounds are regular and normal without murmurs, rubs or gallops noted.   Abdomen: is soft, non-tender and non-distended with normal bowel sounds appreciated on auscultation.  Extremities: There is no pitting edema in the distal lower extremities bilaterally. Pedal pulses are intact.   Skin: is warm and dry with no trophic changes noted.   Musculoskeletal: exam reveals no obvious joint deformities, tenderness, joint swelling or erythema, with the exception of right wrist decrease in range of motion which is not new.  Neurologically:  Mental status: The patient is awake and alert, paying good   attention. He is able to completely provide the history. His wife provides details He is oriented to: person, place, time/date, situation, day of week, month of year and year.  His memory, attention, language and knowledge are intact. There is no aphasia, agnosia, apraxia or anomia. There is a no significant degree of bradyphrenia. Speech is mildly hypophonic with no dysarthria noted. Mood is congruent and affect is normal.   Cranial nerves are as described above under HEENT exam. In addition, shoulder shrug is normal with equal shoulder height noted.  Motor exam: Normal bulk, and strength for age is noted. There are no dyskinesias.  Tone is mildly rigid with presence of cogwheeling in the right upper extremity. There is overall mild bradykinesia. There is no drift or rebound.  There is a very slight intermittent right upper extremity resting tremor. He has no postural or action tremor. Romberg is negative. Reflexes are 2+ throughout, trace in the ankles.  Fine motor skills exam: Finger taps are mild to moderately impaired on the right and Minimally impaired on the left. Hand movements are mildly impaired on the right and Minimally impaired on the left. RAP (rapid alternating patting) is Minimally impaired on the right and Not impaired on the left. Foot taps are mildly impaired on the right and Minimally impaired on the left. Foot agility (in the form of heel stomping) is Minimally to mildly impaired on the right and Not impaired on the left.    Cerebellar testing shows no dysmetria or intention tremor on finger to nose testing. Heel to shin is unremarkable bilaterally. There is no truncal or gait ataxia.   Sensory exam is intact to light touch in the upper and lower extremities.   Gait, station and balance: He stands up from the seated position with mild difficulty and does need to push up with His hands. He needs no assistance. No veering to one side is noted. He is not noted to lean to the side.  Posture is mild to moderately stooped, slightly worse or stable. Stance is slightly wide-based. He walks with decrease in stride length and pace and decrseased arm swing on the right. He turns in 3 steps. Tandem walk is not possible safely. Balance is mildly impaired, but stable.  Assessment and Plan:   In summary, Danny [name] is a very pleasant 65 year old male with an underlying medical history of type 2 diabetes, osteoarthritis, hypertension, obesity, hyperlipidemia, and cataracts, who presents for follow-up consultation of his right-sided predominant Parkinson's disease with mildly progressive symptoms. He has resolution of his diplopia since he was given a prism eye glasses. Today, I suggested we continue with Azilect 1 mg once daily, but increase Mirapex to 0.75 mg 3 times a day. He was agreeable with this. For the most part, his exam has been exam stable, but They have noted more slowness overall. We will continue to monitor mood and memory. We will also continue to monitor for RBD exacerbation. He is working full-time which is something I would encourage him to continue to do as long as possible. This keeps him active mentally, physically and is good for him emotionally. He is hoping to work till age 57 of possible. He is advised to add more regular exercise into his regimen and drink more water. His wife states that he drinks a lot of coffee throughout the day. He admits that he drinks more than 4 cups a day. Therefore, he may not be drinking enough water. He is advised to track his water intake and avoid working at heights or climbing on ladders or steep stairs at work. He is advised to monitor his driving skills. He avoids longer distances and  nighttime driving.  I adjusted his Mirapex prescription to 0.75 mg strength one pill 3 times a day. He will continue to use up his 0.5 mg strength pills and take 1-1/2 pills 3 times a day until finished. I would like to see him back in 6 months, sooner if  the need arises. He is encouraged to call or email with any interim questions or concerns. I answered all  their questions today and the patient and his wife were in agreement.  I spent 25 minutes in total face-to-face time with the patient, more than 50% of which was spent in counseling and coordination of care, reviewing test results, reviewing medication and discussing or reviewing the diagnosis of PD, its prognosis and treatment options.

## 2016-03-27 ENCOUNTER — Ambulatory Visit (INDEPENDENT_AMBULATORY_CARE_PROVIDER_SITE_OTHER): Payer: 59 | Admitting: Physician Assistant

## 2016-03-27 ENCOUNTER — Encounter: Payer: Self-pay | Admitting: Physician Assistant

## 2016-03-27 VITALS — BP 110/80 | HR 88 | Temp 98.5°F | Resp 16 | Ht 68.75 in | Wt 226.0 lb

## 2016-03-27 DIAGNOSIS — E119 Type 2 diabetes mellitus without complications: Secondary | ICD-10-CM

## 2016-03-27 DIAGNOSIS — I10 Essential (primary) hypertension: Secondary | ICD-10-CM | POA: Diagnosis not present

## 2016-03-27 DIAGNOSIS — E782 Mixed hyperlipidemia: Secondary | ICD-10-CM | POA: Diagnosis not present

## 2016-03-27 LAB — COMPREHENSIVE METABOLIC PANEL
ALK PHOS: 45 U/L (ref 40–115)
ALT: 32 U/L (ref 9–46)
AST: 20 U/L (ref 10–35)
Albumin: 4.2 g/dL (ref 3.6–5.1)
BILIRUBIN TOTAL: 1.4 mg/dL — AB (ref 0.2–1.2)
BUN: 19 mg/dL (ref 7–25)
CO2: 23 mmol/L (ref 20–31)
Calcium: 9.8 mg/dL (ref 8.6–10.3)
Chloride: 102 mmol/L (ref 98–110)
Creat: 1.04 mg/dL (ref 0.70–1.25)
GLUCOSE: 128 mg/dL — AB (ref 65–99)
Potassium: 4.5 mmol/L (ref 3.5–5.3)
Sodium: 135 mmol/L (ref 135–146)
Total Protein: 6.8 g/dL (ref 6.1–8.1)

## 2016-03-27 LAB — HEMOGLOBIN A1C
HEMOGLOBIN A1C: 6.9 % — AB (ref <5.7)
Mean Plasma Glucose: 151 mg/dL

## 2016-03-27 NOTE — Progress Notes (Signed)
Patient ID: Danny [name], male    DOB: 09-Dec-1951, 65 y.o.   MRN: [id number]  PCP: Mykenzi Vanzile, PA-C  Subjective:   Chief Complaint  Patient presents with  . Follow-up  . Diabetes    HPI Presents for evaluation of diabetes.  He states his blood sugars have been ranging in the 90s during lunch.  Reports tiring easily that he states is associated with his diagnosis of Parkinson. Saw Dr. Rexene Alberts in March for follow up. She increased his Mirapex to 0.75 mg 3 times a day.  He continues to diet and has been walking for exercise. No longer able to ride his motorcycle, or even on the back as a passenger. No longer able to square dance, though he continues to call the dances.  He has an opthalmologic appointment next week for yearly eye exam with Dr. [place].  Patient is fasting for Hgb A1c and CMP today. His last lipid panel was normal and no changes were made.   Review of Systems Constitutional: Positive for activity change and fatigue. Negative for fever, chills and appetite change.  Respiratory: Negative for shortness of breath.  Cardiovascular: Negative for chest pain and palpitations.  Gastrointestinal: Negative for nausea, vomiting, abdominal pain and diarrhea.  Genitourinary: Negative for dysuria, urgency, frequency and flank pain.  Musculoskeletal: Positive for gait problem. Negative for myalgias and arthralgias.  Neurological: Negative for dizziness, syncope, light-headedness and headaches.     Patient Active Problem List   Diagnosis Date Noted  . Hematospermia 11/18/2015  . Epididymal cyst 05/25/2015  . Parkinson disease ([place]) 01/14/2014  . Obesity (BMI 30-39.9) 07/14/2013  . Confusional arousals 02/13/2013  . Disorder of ligament of right wrist 01/29/2012  . Diabetes mellitus type 2, uncomplicated ([place]) 99991111  . Mixed hyperlipidemia 10/09/2011  . OA (osteoarthritis) of knee 10/09/2011  . Essential hypertension, benign 10/09/2011     Prior to  Admission medications   Medication Sig Start Date End Date Taking? Authorizing Provider  aspirin 81 MG tablet Take 81 mg by mouth daily.     Yes Historical Provider, MD  Cinnamon 500 MG capsule Take 500 mg by mouth daily.   Yes Historical Provider, MD  enalapril (VASOTEC) 2.5 MG tablet Take 1 tablet by mouth  daily 02/21/16  Yes Wardell Honour, MD  Ginger, Zingiber officinalis, 550 MG CAPS Take 550 mg by mouth daily.     Yes Historical Provider, MD  glipiZIDE (GLUCOTROL XL) 5 MG 24 hr tablet TAKE 1 TABLET BY MOUTH DAILY 02/21/16  Yes Wardell Honour, MD  Glucosamine-Chondroit-Vit C-Mn (GLUCOSAMINE CHONDR 1500 COMPLX PO) Take 1,500 mg by mouth daily.     Yes Historical Provider, MD  glucose blood test strip Use as instructed 09/15/13  Yes Ryan M Dunn, PA-C  Multiple Vitamin (MULTIVITAMIN) tablet Take 1 tablet by mouth daily.   Yes Historical Provider, MD  pramipexole (MIRAPEX) 0.75 MG tablet Take 1 tablet (0.75 mg total) by mouth 3 (three) times daily. 02/27/16  Yes [name], MD  rasagiline (AZILECT) 1 MG TABS tablet 1 pill once daily 02/27/16  Yes [name], MD  simvastatin (ZOCOR) 40 MG tablet Take one-half tablet by  mouth daily 08/15/15  Yes Jeryl Umholtz, PA-C  sitaGLIPtin-metformin (JANUMET) 50-1000 MG per tablet TAKE 1 TABLET BY MOUTH TWICE A DAY WITH MEALS 08/15/15  Yes Bradley Bostelman, PA-C     Allergies  Allergen Reactions  . Codeine Nausea And Vomiting       Objective:  Physical Exam  Constitutional:  He is oriented to person, place, and time. He appears well-developed and well-nourished. He is active and cooperative. No distress.  BP 110/80 mmHg  Pulse 88  Temp(Src) 98.5 F (36.9 C) (Oral)  Resp 16  Ht 5' 8.75" (1.746 m)  Wt 226 lb (102.513 kg)  BMI 33.63 kg/m2  SpO2 98%  HENT:  Head: Normocephalic and atraumatic.  Right Ear: Hearing normal.  Left Ear: Hearing normal.  Eyes: Conjunctivae are normal. No scleral icterus.  Neck: Normal range of motion. Neck supple. No  thyromegaly present.  Cardiovascular: Normal rate, regular rhythm and normal heart sounds.   Pulses:      Radial pulses are 2+ on the right side, and 2+ on the left side.  Pulmonary/Chest: Effort normal and breath sounds normal.  Lymphadenopathy:       Head (right side): No tonsillar, no preauricular, no posterior auricular and no occipital adenopathy present.       Head (left side): No tonsillar, no preauricular, no posterior auricular and no occipital adenopathy present.    He has no cervical adenopathy.       Right: No supraclavicular adenopathy present.       Left: No supraclavicular adenopathy present.  Neurological: He is alert and oriented to person, place, and time. No sensory deficit.  Gait is slowed.  Skin: Skin is warm, dry and intact. No rash noted. No cyanosis or erythema. Nails show no clubbing.  Psychiatric: His speech is normal and behavior is normal. Judgment normal. His mood appears not anxious. His affect is blunt. His affect is not angry, not labile and not inappropriate. Cognition and memory are normal. He exhibits a depressed mood. He expresses no suicidal ideation.           Assessment & Plan:   1. Type 2 diabetes mellitus without complication, without long-term current use of insulin ([place]) Await labs. Has been controlled with slight increase in A1C at last visit. - [facility] Diabetes Foot Exam - Hemoglobin A1c - Comprehensive metabolic panel  2. Essential hypertension, benign Controlled/stable. - Comprehensive metabolic panel  3. Mixed hyperlipidemia Controlled/stable. - Comprehensive metabolic panel   [name], PA-C Physician Assistant-Certified [facility]

## 2016-03-27 NOTE — Progress Notes (Signed)
Subjective:     Patient ID: Danny [name], male   DOB: 1951-12-11, 65 y.o.   MRN: [id number]  HPI  Presents for evaluation of diabetes.  He states his blood sugars have been ranging in the 90s during lunch. Reports tiring easily that he states is associated with his diagnosis of Parkinson. Saw Dr. Rexene [name] in March for follow up. She increased his Mirapex to 0.75 mg 3 times a day.  He continues to diet and has been walking for exercise.  He has an opthalmologic appointment next week for yearly eye exam. Patient is fasting for Hgb A1c and CMP today. His last lipid panel was normal and we made no new changes with his medication.   Current Outpatient Prescriptions on File Prior to Visit  Medication Sig Dispense Refill  . aspirin 81 MG tablet Take 81 mg by mouth daily.      . Cinnamon 500 MG capsule Take 500 mg by mouth daily.    . enalapril (VASOTEC) 2.5 MG tablet Take 1 tablet by mouth  daily 90 tablet 0  . Ginger, Zingiber officinalis, 550 MG CAPS Take 550 mg by mouth daily.      Marland Kitchen glipiZIDE (GLUCOTROL XL) 5 MG 24 hr tablet TAKE 1 TABLET BY MOUTH DAILY 90 tablet 3  . Glucosamine-Chondroit-Vit C-Mn (GLUCOSAMINE CHONDR 1500 COMPLX PO) Take 1,500 mg by mouth daily.      Marland Kitchen glucose blood test strip Use as instructed 300 each 4  . Multiple Vitamin (MULTIVITAMIN) tablet Take 1 tablet by mouth daily.    . pramipexole (MIRAPEX) 0.75 MG tablet Take 1 tablet (0.75 mg total) by mouth 3 (three) times daily. 270 tablet 3  . rasagiline (AZILECT) 1 MG TABS tablet 1 pill once daily 90 tablet 3  . simvastatin (ZOCOR) 40 MG tablet Take one-half tablet by  mouth daily 135 tablet 3  . sitaGLIPtin-metformin (JANUMET) 50-1000 MG per tablet TAKE 1 TABLET BY MOUTH TWICE A DAY WITH MEALS 180 tablet 3   No current facility-administered medications on file prior to visit.   Allergies  Allergen Reactions  . Codeine Nausea And Vomiting    Review of Systems  Constitutional: Positive for activity change and  fatigue. Negative for fever, chills and appetite change.  Respiratory: Negative for shortness of breath.   Cardiovascular: Negative for chest pain and palpitations.  Gastrointestinal: Negative for nausea, vomiting, abdominal pain and diarrhea.  Genitourinary: Negative for dysuria, urgency, frequency and flank pain.  Musculoskeletal: Positive for gait problem. Negative for myalgias and arthralgias.  Neurological: Negative for dizziness, syncope, light-headedness and headaches.       Objective:   Physical Exam  Constitutional: He is oriented to person, place, and time. He appears well-developed and well-nourished. No distress.  HENT:  Head: Normocephalic and atraumatic.  Mouth/Throat: Oropharynx is clear and moist and mucous membranes are normal.  Eyes: EOM are normal. Pupils are equal, round, and reactive to light.  Neck: Normal range of motion. Neck supple. No thyromegaly present.  Cardiovascular: Normal rate, regular rhythm, normal heart sounds and intact distal pulses.   Pulmonary/Chest: Effort normal and breath sounds normal.  Lymphadenopathy:    He has no cervical adenopathy.  Neurological: He is alert and oriented to person, place, and time.  Skin: Skin is warm and dry.  Psychiatric: He has a normal mood and affect. His behavior is normal. Thought content normal.       Assessment/Plan:     1. Type 2 diabetes mellitus without complication, without long-term  current use of insulin ([place]) Awaiting lab results to determine if need for medication change. Will have yearly eye exam next week.  - HM Diabetes Foot Exam - Hemoglobin A1c - Comprehensive metabolic panel  2. Essential hypertension, benign Stable on current medication regimen. No change at this time. BP normal today. - Comprehensive metabolic panel  3. Mixed hyperlipidemia Awaiting labs will continue on current medication regimen. - Comprehensive metabolic panel  Danny Schools PA-S 03/27/2016

## 2016-03-27 NOTE — Patient Instructions (Signed)
     IF you received an x-ray today, you will receive an invoice from [facility]. Please contact [facility] at [phone] with questions or concerns regarding your invoice.   IF you received labwork today, you will receive an invoice from [facility]. Please contact [facility] at [phone] with questions or concerns regarding your invoice.   Our billing staff will not be able to assist you with questions regarding bills from these companies.  You will be contacted with the lab results as soon as they are available. The fastest way to get your results is to activate your My Chart account. Instructions are located on the last page of this paperwork. If you have not heard from us regarding the results in 2 weeks, please contact this office.      

## 2016-04-02 LAB — HM DIABETES EYE EXAM

## 2016-04-12 ENCOUNTER — Encounter: Payer: Self-pay | Admitting: Physician Assistant

## 2016-05-08 ENCOUNTER — Other Ambulatory Visit: Payer: Self-pay

## 2016-05-08 MED ORDER — ENALAPRIL MALEATE 2.5 MG PO TABS: ORAL_TABLET | ORAL | Status: DC

## 2016-07-10 ENCOUNTER — Encounter: Payer: Self-pay | Admitting: Physician Assistant

## 2016-07-10 ENCOUNTER — Ambulatory Visit (INDEPENDENT_AMBULATORY_CARE_PROVIDER_SITE_OTHER): Payer: 59 | Admitting: Physician Assistant

## 2016-07-10 VITALS — BP 120/72 | HR 94 | Temp 98.9°F | Resp 18 | Ht 68.75 in | Wt 230.8 lb

## 2016-07-10 DIAGNOSIS — E782 Mixed hyperlipidemia: Secondary | ICD-10-CM | POA: Diagnosis not present

## 2016-07-10 DIAGNOSIS — E119 Type 2 diabetes mellitus without complications: Secondary | ICD-10-CM | POA: Diagnosis not present

## 2016-07-10 DIAGNOSIS — Z0000 Encounter for general adult medical examination without abnormal findings: Secondary | ICD-10-CM

## 2016-07-10 DIAGNOSIS — I10 Essential (primary) hypertension: Secondary | ICD-10-CM | POA: Diagnosis not present

## 2016-07-10 DIAGNOSIS — G20 Parkinson's disease: Secondary | ICD-10-CM | POA: Diagnosis not present

## 2016-07-10 LAB — CBC WITH DIFFERENTIAL/PLATELET
Basophils Absolute: 0 cells/uL (ref 0–200)
Basophils Relative: 0 %
EOS ABS: 236 {cells}/uL (ref 15–500)
Eosinophils Relative: 2 %
HEMATOCRIT: 43 % (ref 38.5–50.0)
Hemoglobin: 14.6 g/dL (ref 13.2–17.1)
LYMPHS PCT: 12 %
Lymphs Abs: 1416 cells/uL (ref 850–3900)
MCH: 31.1 pg (ref 27.0–33.0)
MCHC: 34 g/dL (ref 32.0–36.0)
MCV: 91.5 fL (ref 80.0–100.0)
MONO ABS: 708 {cells}/uL (ref 200–950)
MONOS PCT: 6 %
MPV: 11.3 fL (ref 7.5–12.5)
NEUTROS PCT: 80 %
Neutro Abs: 9440 cells/uL — ABNORMAL HIGH (ref 1500–7800)
PLATELETS: 200 10*3/uL (ref 140–400)
RBC: 4.7 MIL/uL (ref 4.20–5.80)
RDW: 14.1 % (ref 11.0–15.0)
WBC: 11.8 10*3/uL — ABNORMAL HIGH (ref 3.8–10.8)

## 2016-07-10 LAB — POCT URINALYSIS DIP (MANUAL ENTRY)
Glucose, UA: NEGATIVE
LEUKOCYTES UA: NEGATIVE
NITRITE UA: NEGATIVE
PH UA: 5
PROTEIN UA: NEGATIVE
RBC UA: NEGATIVE
Spec Grav, UA: >=1.03
Urobilinogen, UA: 0.2

## 2016-07-10 LAB — COMPREHENSIVE METABOLIC PANEL
ALBUMIN: 4.3 g/dL (ref 3.6–5.1)
ALT: 36 U/L (ref 9–46)
AST: 21 U/L (ref 10–35)
Alkaline Phosphatase: 55 U/L (ref 40–115)
BUN: 16 mg/dL (ref 7–25)
CALCIUM: 9.7 mg/dL (ref 8.6–10.3)
CHLORIDE: 102 mmol/L (ref 98–110)
CO2: 22 mmol/L (ref 20–31)
Creat: 1.12 mg/dL (ref 0.70–1.25)
Glucose, Bld: 146 mg/dL — ABNORMAL HIGH (ref 65–99)
POTASSIUM: 4.6 mmol/L (ref 3.5–5.3)
Sodium: 135 mmol/L (ref 135–146)
Total Bilirubin: 1.4 mg/dL — ABNORMAL HIGH (ref 0.2–1.2)
Total Protein: 6.8 g/dL (ref 6.1–8.1)

## 2016-07-10 LAB — LIPID PANEL
CHOL/HDL RATIO: 1.9 ratio (ref <=5.0)
CHOLESTEROL: 117 mg/dL — AB (ref 125–200)
HDL: 61 mg/dL (ref >=40)
LDL Cholesterol: 37 mg/dL (ref <130)
TRIGLYCERIDES: 97 mg/dL (ref <150)
VLDL: 19 mg/dL (ref <30)

## 2016-07-10 LAB — POC MICROSCOPIC URINALYSIS (UMFC)

## 2016-07-10 NOTE — Progress Notes (Signed)
Patient ID: Danny [name], male    DOB: 25-Mar-1951, 65 y.o.   MRN: [id number]  PCP: [name], PA-C  Chief Complaint  Patient presents with  . Annual Exam    Subjective:   HPI: Presents for annual wellness visit.  Colorectal Cancer Screening: 2010. Normal. Prostate Cancer Screening: normal 04/2015 Bone Density Testing: not yet HIV Screening: complete STI Screening: very low risk Seasonal Influenza Vaccination: annually Td/Tdap Vaccination: 2008 Pneumococcal Vaccination: due for Prevnar 13 at age 43, then booster dose of Pneumovax 23 12 months later Zoster Vaccination: complete Frequency of Dental evaluation: Q6 months (about one month ago) Frequency of Eye evaluation: annually (3-4 months ago)    Patient Active Problem List   Diagnosis Date Noted  . Hematospermia 11/18/2015  . Epididymal cyst 05/25/2015  . Parkinson disease ([place]) 01/14/2014  . Obesity (BMI 30-39.9) 07/14/2013  . Confusional arousals 02/13/2013  . Disorder of ligament of right wrist 01/29/2012  . Diabetes mellitus type 2, uncomplicated ([place]) 99991111  . Mixed hyperlipidemia 10/09/2011  . OA (osteoarthritis) of knee 10/09/2011  . Essential hypertension, benign 10/09/2011    Past Medical History:  Diagnosis Date  . Cataract   . Essential hypertension, benign   . Mixed hyperlipidemia   . OA (osteoarthritis) of knee    right  . Obesity, unspecified   . Parkinsons ([place])   . Type II or unspecified type diabetes mellitus without mention of complication, not stated as uncontrolled      Prior to Admission medications   Medication Sig Start Date End Date Taking? Authorizing Provider  aspirin 81 MG tablet Take 81 mg by mouth daily.     Yes Historical Provider, MD  Cinnamon 500 MG capsule Take 500 mg by mouth daily.   Yes Historical Provider, MD  enalapril (VASOTEC) 2.5 MG tablet Take 1 tablet by mouth  daily 05/08/16  Yes Wardell Honour, MD  Ginger, Zingiber officinalis, 550 MG CAPS Take 550  mg by mouth daily.     Yes Historical Provider, MD  glipiZIDE (GLUCOTROL XL) 5 MG 24 hr tablet TAKE 1 TABLET BY MOUTH DAILY 02/21/16  Yes Wardell Honour, MD  Glucosamine-Chondroit-Vit C-Mn (GLUCOSAMINE CHONDR 1500 COMPLX PO) Take 1,500 mg by mouth daily.     Yes Historical Provider, MD  glucose blood test strip Use as instructed 09/15/13  Yes Ryan M Dunn, PA-C  Multiple Vitamin (MULTIVITAMIN) tablet Take 1 tablet by mouth daily.   Yes Historical Provider, MD  pramipexole (MIRAPEX) 0.75 MG tablet Take 1 tablet (0.75 mg total) by mouth 3 (three) times daily. 02/27/16  Yes [name], MD  rasagiline (AZILECT) 1 MG TABS tablet 1 pill once daily 02/27/16  Yes [name], MD  simvastatin (ZOCOR) 40 MG tablet Take one-half tablet by  mouth daily 08/15/15  Yes Shandee Jergens, PA-C  sitaGLIPtin-metformin (JANUMET) 50-1000 MG per tablet TAKE 1 TABLET BY MOUTH TWICE A DAY WITH MEALS 08/15/15  Yes Trinady Milewski, PA-C    Allergies  Allergen Reactions  . Codeine Nausea And Vomiting    Past Surgical History:  Procedure Laterality Date  . GANGLION CYST EXCISION  age 21 years   right  . KNEE ARTHROSCOPY     right  . WRIST SURGERY  04/2012    Family History  Problem Relation Age of Onset  . Diabetes Sister   . Diabetes Daughter   . Cancer Father   . Heart disease Father   . Diabetes Mother     Social History  Social History  . Marital status: Married    Spouse name: [name]  . Number of children: 3  . Years of education: 11   Occupational History  . Engineer    Social History Main Topics  . Smoking status: Former Smoker    Packs/day: 1.50    Years: 15.00    Types: Cigarettes    Quit date: 12/17/1982  . Smokeless tobacco: Never Used     Comment: over 40 yrs  . Alcohol use No     Comment: very rare  . Drug use: No  . Sexual activity: Yes    Partners: Female   Other Topics Concern  . None   Social History Narrative   Research officer, trade union, motorcycle rider.  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   He has 3 adult children.  His wife had 2 children, one of whom is deceased.   Patient is working full-time.   Patient has a Bachelor's degree.   Patient is right-handed.   Patient drinks 6 cups of coffee daily.       Review of Systems Constitutional: Positive for fatigue. Negative for activity change, appetite change and fever.  HENT: Negative for hearing loss.   Eyes: Negative for pain, discharge and visual disturbance.  Respiratory: Positive for shortness of breath.        X last few months  Cardiovascular: Negative for chest pain, palpitations and leg swelling.  Gastrointestinal: Positive for constipation and vomiting. Negative for abdominal pain and nausea.       Vomiting about 1x per month preceded by coughing   Endocrine: Negative for polydipsia and polyuria.  Genitourinary: Negative for difficulty urinating, discharge, frequency, penile swelling, scrotal swelling, testicular pain and urgency.  Musculoskeletal: Negative.   Skin: Negative for rash and wound.  Neurological: Positive for dizziness. Negative for numbness and headaches.  Hematological: Does not bruise/bleed easily.  Psychiatric/Behavioral: Negative for confusion, decreased concentration, dysphoric mood and sleep disturbance. The patient is not nervous/anxious.       Objective:  Physical Exam  Constitutional: He is oriented to person, place, and time. He appears well-developed and well-nourished. He is active and cooperative.  Non-toxic appearance. He does not have a sickly appearance. He does not appear ill. No distress.  BP 120/72   Pulse 94   Temp 98.9 F (37.2 C) (Oral)   Resp 18   Ht 5' 8.75" (1.746 m)   Wt 230 lb 12.8 oz (104.7 kg)   SpO2 95%   BMI 34.33 kg/m    HENT:  Head: Normocephalic and atraumatic.  Right Ear: Hearing, tympanic membrane, external ear and ear canal normal.  Left Ear: Hearing, tympanic membrane, external ear and ear canal normal.  Nose: Nose normal.  Mouth/Throat: Uvula is  midline, oropharynx is clear and moist and mucous membranes are normal. He does not have dentures. No oral lesions. No trismus in the jaw. Normal dentition. No dental abscesses, uvula swelling, lacerations or dental caries.  Eyes: Conjunctivae, EOM and lids are normal. Pupils are equal, round, and reactive to light. Right eye exhibits no discharge. Left eye exhibits no discharge. No scleral icterus.  Fundoscopic exam:      The right eye shows no arteriolar narrowing, no AV nicking, no exudate, no hemorrhage and no papilledema.       The left eye shows no arteriolar narrowing, no AV nicking, no exudate, no hemorrhage and no papilledema.  Neck: Normal range of motion, full passive range of motion without pain and phonation normal. Neck  supple. No spinous process tenderness and no muscular tenderness present. No neck rigidity. No tracheal deviation, no edema, no erythema and normal range of motion present. No thyromegaly present.  Cardiovascular: Normal rate, regular rhythm, S1 normal, S2 normal, normal heart sounds, intact distal pulses and normal pulses.  Exam reveals no gallop and no friction rub.   No murmur heard. Pulmonary/Chest: Effort normal and breath sounds normal. No respiratory distress. He has no wheezes. He has no rales.  Abdominal: Soft. Normal appearance and bowel sounds are normal. He exhibits no distension and no mass. There is no hepatosplenomegaly. There is no tenderness. There is no rebound and no guarding. No hernia.  Musculoskeletal: Normal range of motion. He exhibits no edema or tenderness.       Cervical back: Normal. He exhibits normal range of motion, no tenderness, no bony tenderness, no swelling, no edema, no deformity, no laceration, no pain, no spasm and normal pulse.       Thoracic back: Normal.       Lumbar back: Normal.  Lymphadenopathy:       Head (right side): No submental, no submandibular, no tonsillar, no preauricular, no posterior auricular and no occipital  adenopathy present.       Head (left side): No submental, no submandibular, no tonsillar, no preauricular, no posterior auricular and no occipital adenopathy present.    He has no cervical adenopathy.       Right: No supraclavicular adenopathy present.       Left: No supraclavicular adenopathy present.  Neurological: He is alert and oriented to person, place, and time. He has normal strength and normal reflexes. He displays no tremor. No cranial nerve deficit. He exhibits normal muscle tone. Coordination and gait normal.  Skin: Skin is warm, dry and intact. No abrasion, no ecchymosis, no laceration, no lesion and no rash noted. He is not diaphoretic. No cyanosis or erythema. No pallor. Nails show no clubbing.  Psychiatric: He has a normal mood and affect. His speech is normal and behavior is normal. Judgment and thought content normal. Cognition and memory are normal.   Diabetic Foot Exam - Simple   Simple Foot Form Visual Inspection No deformities, no ulcerations, no other skin breakdown bilaterally:  Yes Sensation Testing Intact to touch and monofilament testing bilaterally:  Yes Pulse Check Posterior Tibialis and Dorsalis pulse intact bilaterally:  Yes Comments            Assessment & Plan:  1. Annual physical exam Age appropriate anticipatory guidance provided.  2. Type 2 diabetes mellitus without complication, without long-term current use of insulin ([place]) Await lab results. Continue healthy lifestyle changes.  - Comprehensive metabolic panel - Hemoglobin A1c - POCT Microscopic Urinalysis ([facility]) - POCT urinalysis dipstick  3. Essential hypertension, benign Controlled. Continue current treatment. - Comprehensive metabolic panel - CBC with Differential/Platelet  4. Mixed hyperlipidemia Await lab results. - Lipid panel  5. Parkinson disease [facility]) Continue follow-up with neurology.    Return in about 4 months (around 11/10/2016) for re-evaluation of diabetes and  Prevnar 13.    [name], PA-C Physician Assistant-Certified [facility]

## 2016-07-10 NOTE — Progress Notes (Deleted)
   Patient ID: Danny [name], male    DOB: 1951-11-22, 65 y.o.   MRN: [id number]  PCP: [name], PA-C  Subjective:   Chief Complaint  Patient presents with  . Annual Exam    HPI Presents for evaluation of ***.  ***.    Review of Systems     Patient Active Problem List   Diagnosis Date Noted  . Hematospermia 11/18/2015  . Epididymal cyst 05/25/2015  . Parkinson disease ([place]) 01/14/2014  . Obesity (BMI 30-39.9) 07/14/2013  . Confusional arousals 02/13/2013  . Disorder of ligament of right wrist 01/29/2012  . Diabetes mellitus type 2, uncomplicated ([place]) 99991111  . Mixed hyperlipidemia 10/09/2011  . OA (osteoarthritis) of knee 10/09/2011  . Essential hypertension, benign 10/09/2011     Prior to Admission medications   Medication Sig Start Date End Date Taking? Authorizing Provider  aspirin 81 MG tablet Take 81 mg by mouth daily.     Yes Historical Provider, MD  Cinnamon 500 MG capsule Take 500 mg by mouth daily.   Yes Historical Provider, MD  enalapril (VASOTEC) 2.5 MG tablet Take 1 tablet by mouth  daily 05/08/16  Yes Wardell Honour, MD  Ginger, Zingiber officinalis, 550 MG CAPS Take 550 mg by mouth daily.     Yes Historical Provider, MD  glipiZIDE (GLUCOTROL XL) 5 MG 24 hr tablet TAKE 1 TABLET BY MOUTH DAILY 02/21/16  Yes Wardell Honour, MD  Glucosamine-Chondroit-Vit C-Mn (GLUCOSAMINE CHONDR 1500 COMPLX PO) Take 1,500 mg by mouth daily.     Yes Historical Provider, MD  glucose blood test strip Use as instructed 09/15/13  Yes Ryan M Dunn, PA-C  Multiple Vitamin (MULTIVITAMIN) tablet Take 1 tablet by mouth daily.   Yes Historical Provider, MD  pramipexole (MIRAPEX) 0.75 MG tablet Take 1 tablet (0.75 mg total) by mouth 3 (three) times daily. 02/27/16  Yes [name], MD  rasagiline (AZILECT) 1 MG TABS tablet 1 pill once daily 02/27/16  Yes [name], MD  simvastatin (ZOCOR) 40 MG tablet Take one-half tablet by  mouth daily 08/15/15  Yes Gabriellia Rempel, PA-C    sitaGLIPtin-metformin (JANUMET) 50-1000 MG per tablet TAKE 1 TABLET BY MOUTH TWICE A DAY WITH MEALS 08/15/15  Yes Shannen Vernon, PA-C     Allergies  Allergen Reactions  . Codeine Nausea And Vomiting       Objective:  Physical Exam         Assessment & Plan:   ***

## 2016-07-10 NOTE — Progress Notes (Signed)
Subjective:    Patient ID: Danny [name], male    DOB: [birth year], 65 y.o.   MRN: [id number]  HPI Danny [name] presents today for annual exam and f/u on DM. He and his wife just returned from a cruise to [place]. He is checking his blood sugars weekly to monthly with a typical blood glucose of 110 (range 90s-130s).  He feels like his Parkinson's disease is worsening, and has most affected his walking and balance. He has tremors with fatigue. He is planning to start going to the gym more often.  He is c/o of a sore throat with some dysphagia that started yesterday. He thinks this may be due to the change in climate in adjusting to being back home from [place]. No fever, cough, sinus pressure.    Review of Systems  Constitutional: Positive for fatigue. Negative for activity change, appetite change and fever.  HENT: Negative for hearing loss.   Eyes: Negative for pain, discharge and visual disturbance.  Respiratory: Positive for shortness of breath.        X last few months  Cardiovascular: Negative for chest pain, palpitations and leg swelling.  Gastrointestinal: Positive for constipation and vomiting. Negative for abdominal pain and nausea.       Vomiting about 1x per month preceded by coughing   Endocrine: Negative for polydipsia and polyuria.  Genitourinary: Negative for difficulty urinating, discharge, frequency, penile swelling, scrotal swelling, testicular pain and urgency.  Musculoskeletal: Negative.   Skin: Negative for rash and wound.  Neurological: Positive for dizziness. Negative for numbness and headaches.  Hematological: Does not bruise/bleed easily.  Psychiatric/Behavioral: Negative for confusion, decreased concentration, dysphoric mood and sleep disturbance. The patient is not nervous/anxious.    Past Medical History:  Diagnosis Date  . Cataract   . Essential hypertension, benign   . Mixed hyperlipidemia   . OA (osteoarthritis) of knee    right  . Obesity, unspecified    . Parkinsons ([place])   . Type II or unspecified type diabetes mellitus without mention of complication, not stated as uncontrolled    Past Surgical History:  Procedure Laterality Date  . GANGLION CYST EXCISION  age 25 years   right  . KNEE ARTHROSCOPY     right  . WRIST SURGERY  04/2012   Family History  Problem Relation Age of Onset  . Diabetes Sister   . Diabetes Daughter   . Cancer Father   . Heart disease Father   . Diabetes Mother    Social History   Social History  . Marital status: Married    Spouse name: Danny [name]  . Number of children: 3  . Years of education: 44   Occupational History  . Engineer    Social History Main Topics  . Smoking status: Former Smoker    Packs/day: 1.50    Years: 15.00    Types: Cigarettes    Quit date: 12/17/1982  . Smokeless tobacco: Never Used     Comment: over 40 yrs  . Alcohol use No     Comment: very rare  . Drug use: No  . Sexual activity: Yes    Partners: Female   Other Topics Concern  . 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  He has 3 adult children.  His wife had 2 children, one of whom is deceased.   Patient is working full-time.   Patient has a Bachelor's degree.   Patient is  right-handed.   Patient drinks 6 cups of coffee daily.    Current Outpatient Prescriptions:  .  aspirin 81 MG tablet, Take 81 mg by mouth daily.  , Disp: , Rfl:  .  Cinnamon 500 MG capsule, Take 500 mg by mouth daily., Disp: , Rfl:  .  enalapril (VASOTEC) 2.5 MG tablet, Take 1 tablet by mouth  daily, Disp: 90 tablet, Rfl: 1 .  Ginger, Zingiber officinalis, 550 MG CAPS, Take 550 mg by mouth daily.  , Disp: , Rfl:  .  glipiZIDE (GLUCOTROL XL) 5 MG 24 hr tablet, TAKE 1 TABLET BY MOUTH DAILY, Disp: 90 tablet, Rfl: 3 .  Glucosamine-Chondroit-Vit C-Mn (GLUCOSAMINE CHONDR 1500 COMPLX PO), Take 1,500 mg by mouth daily.  , Disp: , Rfl:  .  glucose blood test strip, Use as instructed, Disp: 300  each, Rfl: 4 .  Multiple Vitamin (MULTIVITAMIN) tablet, Take 1 tablet by mouth daily., Disp: , Rfl:  .  pramipexole (MIRAPEX) 0.75 MG tablet, Take 1 tablet (0.75 mg total) by mouth 3 (three) times daily., Disp: 270 tablet, Rfl: 3 .  rasagiline (AZILECT) 1 MG TABS tablet, 1 pill once daily, Disp: 90 tablet, Rfl: 3 .  simvastatin (ZOCOR) 40 MG tablet, Take one-half tablet by  mouth daily, Disp: 135 tablet, Rfl: 3 .  sitaGLIPtin-metformin (JANUMET) 50-1000 MG per tablet, TAKE 1 TABLET BY MOUTH TWICE A DAY WITH MEALS, Disp: 180 tablet, Rfl: 3   Allergies  Allergen Reactions  . Codeine Nausea And Vomiting      Objective:   Physical Exam  Constitutional: He is oriented to person, place, and time. Vital signs are normal. He appears well-developed and well-nourished. He is cooperative. No distress.  Blood pressure 120/72, pulse 94, temperature 98.9 F (37.2 C), temperature source Oral, resp. rate 18, height 5' 8.75" (1.746 m), weight 230 lb 12.8 oz (104.7 kg), SpO2 95 %.  HENT:  Head: Normocephalic and atraumatic.  Right Ear: Hearing and external ear normal.  Left Ear: Hearing and external ear normal.  Nose: Nose normal.  Mouth/Throat: Uvula is midline and mucous membranes are normal. Posterior oropharyngeal erythema present. No oropharyngeal exudate.  BL canals filled with soft orange cerumen, could not visualize TMs  Eyes: Conjunctivae and lids are normal.  Neck: Trachea normal and normal range of motion. Neck supple. No spinous process tenderness present. No thyromegaly present.  Cardiovascular: Normal rate, regular rhythm, normal heart sounds and intact distal pulses.   Pulmonary/Chest: Effort normal and breath sounds normal.  Abdominal: Soft. Bowel sounds are normal. There is no tenderness.  Musculoskeletal: Normal range of motion.  Lymphadenopathy:    He has no cervical adenopathy.  Neurological: He is alert and oriented to person, place, and time. He has normal strength. He displays  tremor. No sensory deficit.  Slight tremor with grip BL  Skin: Skin is warm, dry and intact. No cyanosis. Nails show no clubbing.  Psychiatric: He has a normal mood and affect. His speech is normal and behavior is normal. Judgment and thought content normal. Cognition and memory are normal.   Diabetic Foot Exam - Simple   Simple Foot Form Visual Inspection No deformities, no ulcerations, no other skin breakdown bilaterally:  Yes Sensation Testing Intact to touch and monofilament testing bilaterally:  Yes Pulse Check Posterior Tibialis and Dorsalis pulse intact bilaterally:  Yes Comments       Assessment & Plan:  1. Type 2 diabetes mellitus w/o complication, w/o long-term use of insulin - CMET - Hgb A1c  pending - Microscopic urinalysis  - Urine dipstick  - continue with glipizide 5 mg PO daily - continue with janumet 50-1000 mg PO BID   2. Essential HTN - CMET  - CBC w/ diff - continue with aspirin 81 mg PO daily - continue with enalapril 2.5 mg PO daily  3. Mixed hyperlipidemia - lipid panel - continue with simvastatin 40 mg PO daily  4. Parkinson disease - continue to f/u with neurology - continue rasagiline 1 mg PO daily  Tony Friscia Rayburn PA-S 07/10/16

## 2016-07-10 NOTE — Patient Instructions (Addendum)
   IF you received an x-ray today, you will receive an invoice from [facility]. Please contact [facility] at [phone] with questions or concerns regarding your invoice.   IF you received labwork today, you will receive an invoice from [facility]. Please contact [facility] at [phone] with questions or concerns regarding your invoice.   Our billing staff will not be able to assist you with questions regarding bills from these companies.  You will be contacted with the lab results as soon as they are available. The fastest way to get your results is to activate your My Chart account. Instructions are located on the last page of this paperwork. If you have not heard from us regarding the results in 2 weeks, please contact this office.    Keeping you healthy  Get these tests  Blood pressure- Have your blood pressure checked once a year by your healthcare provider.  Normal blood pressure is 120/80  Weight- Have your body mass index (BMI) calculated to screen for obesity.  BMI is a measure of body fat based on height and weight. You can also calculate your own BMI at [email or web address].com/bmi/.  Cholesterol- Have your cholesterol checked every year.  Diabetes- Have your blood sugar checked regularly if you have high blood pressure, high cholesterol, have a family history of diabetes or if you are overweight.  Screening for Colon Cancer- Colonoscopy starting at age 50.  Screening may begin sooner depending on your family history and other health conditions. Follow up colonoscopy as directed by your Gastroenterologist.  Screening for Prostate Cancer- Both blood work (PSA) and a rectal exam help screen for Prostate Cancer.  Screening begins at age 40 with African-American men and at age 50 with Caucasian men.  Screening may begin sooner depending on your family history.  Take these medicines  Aspirin- One aspirin daily can help prevent Heart  disease and Stroke.  Flu shot- Every fall.  Tetanus- Every 10 years.  Zostavax- Once after the age of 60 to prevent Shingles.  Pneumonia shot- Once after the age of 65; if you are younger than 65, ask your healthcare provider if you need a Pneumonia shot.  Take these steps  Don't smoke- If you do smoke, talk to your doctor about quitting.  For tips on how to quit, go to www.smokefree.gov or call 1-800-QUIT-NOW.  Be physically active- Exercise 5 days a week for at least 30 minutes.  If you are not already physically active start slow and gradually work up to 30 minutes of moderate physical activity.  Examples of moderate activity include walking briskly, mowing the yard, dancing, swimming, bicycling, etc.  Eat a healthy diet- Eat a variety of healthy food such as fruits, vegetables, low fat milk, low fat cheese, yogurt, lean meant, poultry, fish, beans, tofu, etc. For more information go to www.thenutritionsource.org  Drink alcohol in moderation- Limit alcohol intake to less than two drinks a day. Never drink and drive.  Dentist- Brush and floss twice daily; visit your dentist twice a year.  Depression- Your emotional health is as important as your physical health. If you're feeling down, or losing interest in things you would normally enjoy please talk to your healthcare provider.  Eye exam- Visit your eye doctor every year.  Safe sex- If you may be exposed to a sexually transmitted infection, use a condom.  Seat belts- Seat belts can save your life; always wear one.  Smoke/Carbon Monoxide detectors- These detectors need to be installed on   the appropriate level of your home.  Replace batteries at least once a year.  Skin cancer- When out in the sun, cover up and use sunscreen 15 SPF or higher.  Violence- If anyone is threatening you, please tell your healthcare provider.  Living Will/ Health care power of attorney- Speak with your healthcare provider and family. 

## 2016-07-11 ENCOUNTER — Encounter: Payer: Self-pay | Admitting: Physician Assistant

## 2016-07-11 LAB — HEMOGLOBIN A1C
Hgb A1c MFr Bld: 7.3 % — ABNORMAL HIGH (ref <5.7)
Mean Plasma Glucose: 163 mg/dL

## 2016-07-23 ENCOUNTER — Other Ambulatory Visit: Payer: Self-pay | Admitting: Physician Assistant

## 2016-07-24 ENCOUNTER — Encounter: Payer: Self-pay | Admitting: Family Medicine

## 2016-07-24 ENCOUNTER — Ambulatory Visit (INDEPENDENT_AMBULATORY_CARE_PROVIDER_SITE_OTHER): Payer: 59 | Admitting: Family Medicine

## 2016-07-24 ENCOUNTER — Ambulatory Visit (INDEPENDENT_AMBULATORY_CARE_PROVIDER_SITE_OTHER): Payer: 59

## 2016-07-24 VITALS — BP 118/78 | HR 98 | Temp 97.6°F | Resp 18 | Ht 68.75 in | Wt 230.0 lb

## 2016-07-24 DIAGNOSIS — J988 Other specified respiratory disorders: Secondary | ICD-10-CM | POA: Diagnosis not present

## 2016-07-24 DIAGNOSIS — R05 Cough: Secondary | ICD-10-CM

## 2016-07-24 DIAGNOSIS — R059 Cough, unspecified: Secondary | ICD-10-CM

## 2016-07-24 DIAGNOSIS — J22 Unspecified acute lower respiratory infection: Secondary | ICD-10-CM

## 2016-07-24 MED ORDER — AZITHROMYCIN 250 MG PO TABS: ORAL_TABLET | ORAL | 0 refills | Status: DC

## 2016-07-24 MED ORDER — BENZONATATE 100 MG PO CAPS: 100.0000 mg | ORAL_CAPSULE | Freq: Three times a day (TID) | ORAL | 0 refills | Status: DC | PRN

## 2016-07-24 NOTE — Patient Instructions (Addendum)
Start azithromycin, use mucinex or tessalon for cough. If not improving in next 2-3 days, return for recheck.  If any increased shortness of breath or worsening sooner - return here or emergency room.   Cough, Adult Coughing is a reflex that clears your throat and your airways. Coughing helps to heal and protect your lungs. It is normal to cough occasionally, but a cough that happens with other symptoms or lasts a long time may be a sign of a condition that needs treatment. A cough may last only 2-3 weeks (acute), or it may last longer than 8 weeks (chronic). CAUSES Coughing is commonly caused by:  Breathing in substances that irritate your lungs.  A viral or bacterial respiratory infection.  Allergies.  Asthma.  Postnasal drip.  Smoking.  Acid backing up from the stomach into the esophagus (gastroesophageal reflux).  Certain medicines.  Chronic lung problems, including COPD (or rarely, lung cancer).  Other medical conditions such as heart failure. HOME CARE INSTRUCTIONS  Pay attention to any changes in your symptoms. Take these actions to help with your discomfort:  Take medicines only as told by your health care provider.  If you were prescribed an antibiotic medicine, take it as told by your health care provider. Do not stop taking the antibiotic even if you start to feel better.  Talk with your health care provider before you take a cough suppressant medicine.  Drink enough fluid to keep your urine clear or pale yellow.  If the air is dry, use a cold steam vaporizer or humidifier in your bedroom or your home to help loosen secretions.  Avoid anything that causes you to cough at work or at home.  If your cough is worse at night, try sleeping in a semi-upright position.  Avoid cigarette smoke. If you smoke, quit smoking. If you need help quitting, ask your health care provider.  Avoid caffeine.  Avoid alcohol.  Rest as needed. SEEK MEDICAL CARE IF:   You have  new symptoms.  You cough up pus.  Your cough does not get better after 2-3 weeks, or your cough gets worse.  You cannot control your cough with suppressant medicines and you are losing sleep.  You develop pain that is getting worse or pain that is not controlled with pain medicines.  You have a fever.  You have unexplained weight loss.  You have night sweats. SEEK IMMEDIATE MEDICAL CARE IF:  You cough up blood.  You have difficulty breathing.  Your heartbeat is very fast.   This information is not intended to replace advice given to you by your health care provider. Make sure you discuss any questions you have with your health care provider.   Document Released: 06/01/2011 Document Revised: 08/24/2015 Document Reviewed: 02/09/2015 Elsevier Interactive Patient Education 2016 [place] Pneumonia, Adult Pneumonia is an infection of the lungs. There are different types of pneumonia. One type can develop while a person is in a hospital. A different type, called community-acquired pneumonia, develops in people who are not, or have not recently been, in the hospital or other health care facility.  CAUSES Pneumonia may be caused by bacteria, viruses, or funguses. Community-acquired pneumonia is often caused by Streptococcus pneumonia bacteria. These bacteria are often passed from one person to another by breathing in droplets from the cough or sneeze of an infected person. RISK FACTORS The condition is more likely to develop in:  People who havechronic diseases, such as chronic obstructive pulmonary disease (COPD), asthma,  congestive heart failure, cystic fibrosis, diabetes, or kidney disease.  People who haveearly-stage or late-stage HIV.  People who havesickle cell disease.  People who havehad their spleen removed (splenectomy).  People who havepoor Human resources officer.  People who havemedical conditions that increase the risk of breathing in  (aspirating) secretions their own mouth and nose.   People who havea weakened immune system (immunocompromised).  People who smoke.  People whotravel to areas where pneumonia-causing germs commonly exist.  People whoare around animal habitats or animals that have pneumonia-causing germs, including birds, bats, rabbits, cats, and farm animals. SYMPTOMS Symptoms of this condition include:  Adry cough.  A wet (productive) cough.  Fever.  Sweating.  Chest pain, especially when breathing deeply or coughing.  Rapid breathing or difficulty breathing.  Shortness of breath.  Shaking chills.  Fatigue.  Muscle aches. DIAGNOSIS Your health care provider will take a medical history and perform a physical exam. You may also have other tests, including:  Imaging studies of your chest, including X-rays.  Tests to check your blood oxygen level and other blood gases.  Other tests on blood, mucus (sputum), fluid around your lungs (pleural fluid), and urine. If your pneumonia is severe, other tests may be done to identify the specific cause of your illness. TREATMENT The type of treatment that you receive depends on many factors, such as the cause of your pneumonia, the medicines you take, and other medical conditions that you have. For most adults, treatment and recovery from pneumonia may occur at home. In some cases, treatment must happen in a hospital. Treatment may include:  Antibiotic medicines, if the pneumonia was caused by bacteria.  Antiviral medicines, if the pneumonia was caused by a virus.  Medicines that are given by mouth or through an IV tube.  Oxygen.  Respiratory therapy. Although rare, treating severe pneumonia may include:  Mechanical ventilation. This is done if you are not breathing well on your own and you cannot maintain a safe blood oxygen level.  Thoracentesis. This procedureremoves fluid around one lung or both lungs to help you breathe  better. HOME CARE INSTRUCTIONS  Take over-the-counter and prescription medicines only as told by your health care provider.  Only takecough medicine if you are losing sleep. Understand that cough medicine can prevent your body's natural ability to remove mucus from your lungs.  If you were prescribed an antibiotic medicine, take it as told by your health care provider. Do not stop taking the antibiotic even if you start to feel better.  Sleep in a semi-upright position at night. Try sleeping in a reclining chair, or place a few pillows under your head.  Do not use tobacco products, including cigarettes, chewing tobacco, and e-cigarettes. If you need help quitting, ask your health care provider.  Drink enough water to keep your urine clear or pale yellow. This will help to thin out mucus secretions in your lungs. PREVENTION There are ways that you can decrease your risk of developing community-acquired pneumonia. Consider getting a pneumococcal vaccine if:  You are older than 65 years of age.  You are older than 65 years of age and are undergoing cancer treatment, have chronic lung disease, or have other medical conditions that affect your immune system. Ask your health care provider if this applies to you. There are different types and schedules of pneumococcal vaccines. Ask your health care provider which vaccination option is best for you. You may also prevent community-acquired pneumonia if you take these actions:  Get an  influenza vaccine every year. Ask your health care provider which type of influenza vaccine is best for you.  Go to the dentist on a regular basis.  Wash your hands often. Use hand sanitizer if soap and water are not available. SEEK MEDICAL CARE IF:  You have a fever.  You are losing sleep because you cannot control your cough with cough medicine. SEEK IMMEDIATE MEDICAL CARE IF:  You have worsening shortness of breath.  You have increased chest pain.  Your  sickness becomes worse, especially if you are an older adult or have a weakened immune system.  You cough up blood.   This information is not intended to replace advice given to you by your health care provider. Make sure you discuss any questions you have with your health care provider.   Document Released: 12/03/2005 Document Revised: 08/24/2015 Document Reviewed: 03/30/2015 Elsevier Interactive Patient Education 2016 [employer].   IF you received an x-ray today, you will receive an invoice from [facility]. Please contact [facility] at [phone] with questions or concerns regarding your invoice.   IF you received labwork today, you will receive an invoice from [employer]. Please contact [facility] at [phone] with questions or concerns regarding your invoice.   Our billing staff will not be able to assist you with questions regarding bills from these companies.  You will be contacted with the lab results as soon as they are available. The fastest way to get your results is to activate your My Chart account. Instructions are located on the last page of this paperwork. If you have not heard from [place] regarding the results in 2 weeks, please contact this office.

## 2016-07-24 NOTE — Progress Notes (Signed)
By signing my name below, I, Danny [name], attest that this documentation has been prepared under the direction and in the presence of Danny Ray, MD.  Electronically Signed: Verlee [name], Medical Scribe. 07/24/16. 2:08 PM.  Subjective:    Patient ID: Danny [name], male    DOB: [birth year], 65 y.o.   MRN: [id number]  HPI Chief Complaint  Patient presents with  . Cough    x 2weeks   . Sinus Problem    HPI Comments: Danny [name] is a 65 y.o. male who presents to the [facility] complaining of dry "hacking" cough  for 2 weeks after his cruise to [place]. Pt mentions his cough gets worse at night. Pt reports wheezing, SOB with his coughing, nasal congestion with clear sputum, rhinorrhea, and sinus HA. Pt has had a few sick contacts since he started coughing. Pt took NyQuil, and DayQuil for the past week and a half with no relief to his symptoms. Pt denies PMHx of heart problems, HTN, asthma/never used an inhaler, and blood clots. Pt denies chest pain, calf pain, leg pain, and leg swelling.  Patient Active Problem List   Diagnosis Date Noted  . Hematospermia 11/18/2015  . Epididymal cyst 05/25/2015  . Parkinson disease ([place]) 01/14/2014  . Obesity (BMI 30-39.9) 07/14/2013  . Confusional arousals 02/13/2013  . Disorder of ligament of right wrist 01/29/2012  . Diabetes mellitus type 2, uncomplicated ([place]) 99991111  . Mixed hyperlipidemia 10/09/2011  . OA (osteoarthritis) of knee 10/09/2011  . Essential hypertension, benign 10/09/2011   Past Medical History:  Diagnosis Date  . Cataract   . Essential hypertension, benign   . Mixed hyperlipidemia   . OA (osteoarthritis) of knee    right  . Obesity, unspecified   . Parkinsons ([place])   . Type II or unspecified type diabetes mellitus without mention of complication, not stated as uncontrolled    Past Surgical History:  Procedure Laterality Date  . GANGLION CYST EXCISION  age 7 years   right  .  KNEE ARTHROSCOPY     right  . WRIST SURGERY  04/2012   Allergies  Allergen Reactions  . Codeine Nausea And Vomiting   Prior to Admission medications   Medication Sig Start Date End Date Taking? Authorizing Provider  aspirin 81 MG tablet Take 81 mg by mouth daily.     Yes Historical Provider, MD  Cinnamon 500 MG capsule Take 500 mg by mouth daily.   Yes Historical Provider, MD  enalapril (VASOTEC) 2.5 MG tablet Take 1 tablet by mouth  daily 05/08/16  Yes Wardell Honour, MD  Ginger, Zingiber officinalis, 550 MG CAPS Take 550 mg by mouth daily.     Yes Historical Provider, MD  glipiZIDE (GLUCOTROL XL) 5 MG 24 hr tablet TAKE 1 TABLET BY MOUTH DAILY 02/21/16  Yes Wardell Honour, MD  Glucosamine-Chondroit-Vit C-Mn (GLUCOSAMINE CHONDR 1500 COMPLX PO) Take 1,500 mg by mouth daily.     Yes Historical Provider, MD  glucose blood test strip Use as instructed 09/15/13  Yes Ryan M Dunn, PA-C  Multiple Vitamin (MULTIVITAMIN) tablet Take 1 tablet by mouth daily.   Yes Historical Provider, MD  pramipexole (MIRAPEX) 0.75 MG tablet Take 1 tablet (0.75 mg total) by mouth 3 (three) times daily. 02/27/16  Yes [name], MD  Pseudoephedrine-APAP-DM (DAYQUIL MULTI-SYMPTOM COLD/FLU PO) Take by mouth.   Yes Historical Provider, MD  rasagiline (AZILECT) 1 MG TABS tablet 1 pill once daily 02/27/16  Yes Saima  Rexene Alberts, MD  simvastatin (ZOCOR) 40 MG tablet Take one-half tablet by  mouth daily 08/15/15  Yes Chelle Jeffery, PA-C  sitaGLIPtin-metformin (JANUMET) 50-1000 MG per tablet TAKE 1 TABLET BY MOUTH TWICE A DAY WITH MEALS 08/15/15  Yes [name], PA-C   Social History   Social History  . Marital status: Married    Spouse name: Danny [name]  . Number of children: 3  . Years of education: 63   Occupational History  . Engineer    Social History Main Topics  . Smoking status: Former Smoker    Packs/day: 1.50    Years: 15.00    Types: Cigarettes    Quit date: 12/17/1982  . Smokeless tobacco: Never Used     Comment:  over 40 yrs  . Alcohol use No     Comment: very rare  . Drug use: No  . Sexual activity: Yes    Partners: Female   Other Topics Concern  . Not on file   Social History Narrative   Danny [name], motorcycle rider.  He lives with his wife Danny [name]).  He has 3 adult children.  His wife had 2 children, one of whom is deceased.   Patient is working full-time.   Patient has a Bachelor's degree.   Patient is right-handed.   Patient drinks 6 cups of coffee daily.   Depression screen [facility] 2/9 07/24/2016 07/10/2016 03/27/2016 12/13/2015 10/12/2015  Decreased Interest 0 0 0 0 0  Down, Depressed, Hopeless 0 0 0 0 0  PHQ - 2 Score 0 0 0 0 0  Tired, decreased energy - - - - -   Review of Systems  HENT: Positive for congestion and rhinorrhea.   Respiratory: Positive for cough, shortness of breath and wheezing.   Cardiovascular: Negative for chest pain and leg swelling.  Musculoskeletal: Negative for myalgias.  Neurological: Positive for headaches.    Objective:  Physical Exam  Constitutional: He appears well-developed and well-nourished. No distress.  HENT:  Head: Normocephalic and atraumatic.  Minimal clear nasal discharge bilaterally Sinuses non tender  Eyes: Conjunctivae are normal.  Neck: Neck supple.  Cardiovascular: Normal rate.   Pulmonary/Chest: Effort normal.  Breath sounds distant but clear.  Neurological: He is alert.  Skin: Skin is warm and dry.  Psychiatric: He has a normal mood and affect. His behavior is normal.  Nursing note and vitals reviewed.  BP 118/78 (BP Location: Left Arm, Patient Position: Sitting, Cuff Size: Normal)   Pulse 98   Temp 97.6 F (36.4 C) (Oral)   Resp 18   Ht 5' 8.75" (1.746 m)   Wt 230 lb (104.3 kg)   SpO2 95%   BMI 34.21 kg/m    Dg Chest 2 View  Result Date: 07/24/2016 CLINICAL DATA:  Cough. EXAM: CHEST  2 VIEW COMPARISON:  05/25/2012. FINDINGS: Mediastinum and hilar structures are normal. Heart size normal. Low lung volumes with mild  bibasilar atelectasis and/or infiltrates. No pleural effusion or pneumothorax . IMPRESSION: Low lung volumes with mild bibasilar atelectasis and/or infiltrates. Electronically Signed   By: Marcello Moores  Register   On: 07/24/2016 14:41   Assessment & Plan:    STEPHENSON GOINGS is a 65 y.o. male Cough - Plan: DG Chest 2 View, benzonatate (TESSALON) 100 MG capsule, azithromycin (ZITHROMAX) 250 MG tablet  LRTI (lower respiratory tract infection) - Plan: benzonatate (TESSALON) 100 MG capsule, azithromycin (ZITHROMAX) 250 MG tablet  Initially suspected viral illness with secondary cough. Now with 2 week hx of cough, some recent worsening.  Possible inferior infiltrates vs atelectasis.    -start Zpak, tessalon if needed for cough or Mucinex.  - sx care and rtc precautions discussed.  Meds ordered this encounter  Medications  .       . benzonatate (TESSALON) 100 MG capsule    Sig: Take 1 capsule (100 mg total) by mouth 3 (three) times daily as needed for cough.    Dispense:  20 capsule    Refill:  0  . azithromycin (ZITHROMAX) 250 MG tablet    Sig: Take 2 pills by mouth on day 1, then 1 pill by mouth per day on days 2 through 5.    Dispense:  6 tablet    Refill:  0   Patient Instructions    Start azithromycin, use mucinex or tessalon for cough. If not improving in next 2-3 days, return for recheck.  If any increased shortness of breath or worsening sooner - return here or emergency room.   Cough, Adult Coughing is a reflex that clears your throat and your airways. Coughing helps to heal and protect your lungs. It is normal to cough occasionally, but a cough that happens with other symptoms or lasts a long time may be a sign of a condition that needs treatment. A cough may last only 2-3 weeks (acute), or it may last longer than 8 weeks (chronic). CAUSES Coughing is commonly caused by:  Breathing in substances that irritate your lungs.  A viral or bacterial respiratory  infection.  Allergies.  Asthma.  Postnasal drip.  Smoking.  Acid backing up from the stomach into the esophagus (gastroesophageal reflux).  Certain medicines.  Chronic lung problems, including COPD (or rarely, lung cancer).  Other medical conditions such as heart failure. HOME CARE INSTRUCTIONS  Pay attention to any changes in your symptoms. Take these actions to help with your discomfort:  Take medicines only as told by your health care provider.  If you were prescribed an antibiotic medicine, take it as told by your health care provider. Do not stop taking the antibiotic even if you start to feel better.  Talk with your health care provider before you take a cough suppressant medicine.  Drink enough fluid to keep your urine clear or pale yellow.  If the air is dry, use a cold steam vaporizer or humidifier in your bedroom or your home to help loosen secretions.  Avoid anything that causes you to cough at work or at home.  If your cough is worse at night, try sleeping in a semi-upright position.  Avoid cigarette smoke. If you smoke, quit smoking. If you need help quitting, ask your health care provider.  Avoid caffeine.  Avoid alcohol.  Rest as needed. SEEK MEDICAL CARE IF:   You have new symptoms.  You cough up pus.  Your cough does not get better after 2-3 weeks, or your cough gets worse.  You cannot control your cough with suppressant medicines and you are losing sleep.  You develop pain that is getting worse or pain that is not controlled with pain medicines.  You have a fever.  You have unexplained weight loss.  You have night sweats. SEEK IMMEDIATE MEDICAL CARE IF:  You cough up blood.  You have difficulty breathing.  Your heartbeat is very fast.   This information is not intended to replace advice given to you by your health care provider. Make sure you discuss any questions you have with your health care provider.   Document Released:  06/01/2011 Document Revised:  08/24/2015 Document Reviewed: 02/09/2015 Elsevier Interactive Patient Education 2016 [place] Pneumonia, Adult Pneumonia is an infection of the lungs. There are different types of pneumonia. One type can develop while a person is in a hospital. A different type, called community-acquired pneumonia, develops in people who are not, or have not recently been, in the hospital or other health care facility.  CAUSES Pneumonia may be caused by bacteria, viruses, or funguses. Community-acquired pneumonia is often caused by Streptococcus pneumonia bacteria. These bacteria are often passed from one person to another by breathing in droplets from the cough or sneeze of an infected person. RISK FACTORS The condition is more likely to develop in:  People who havechronic diseases, such as chronic obstructive pulmonary disease (COPD), asthma, congestive heart failure, cystic fibrosis, diabetes, or kidney disease.  People who haveearly-stage or late-stage HIV.  People who havesickle cell disease.  People who havehad their spleen removed (splenectomy).  People who havepoor Human resources officer.  People who havemedical conditions that increase the risk of breathing in (aspirating) secretions their own mouth and nose.   People who havea weakened immune system (immunocompromised).  People who smoke.  People whotravel to areas where pneumonia-causing germs commonly exist.  People whoare around animal habitats or animals that have pneumonia-causing germs, including birds, bats, rabbits, cats, and farm animals. SYMPTOMS Symptoms of this condition include:  Adry cough.  A wet (productive) cough.  Fever.  Sweating.  Chest pain, especially when breathing deeply or coughing.  Rapid breathing or difficulty breathing.  Shortness of breath.  Shaking chills.  Fatigue.  Muscle aches. DIAGNOSIS Your health care provider will take a  medical history and perform a physical exam. You may also have other tests, including:  Imaging studies of your chest, including X-rays.  Tests to check your blood oxygen level and other blood gases.  Other tests on blood, mucus (sputum), fluid around your lungs (pleural fluid), and urine. If your pneumonia is severe, other tests may be done to identify the specific cause of your illness. TREATMENT The type of treatment that you receive depends on many factors, such as the cause of your pneumonia, the medicines you take, and other medical conditions that you have. For most adults, treatment and recovery from pneumonia may occur at home. In some cases, treatment must happen in a hospital. Treatment may include:  Antibiotic medicines, if the pneumonia was caused by bacteria.  Antiviral medicines, if the pneumonia was caused by a virus.  Medicines that are given by mouth or through an IV tube.  Oxygen.  Respiratory therapy. Although rare, treating severe pneumonia may include:  Mechanical ventilation. This is done if you are not breathing well on your own and you cannot maintain a safe blood oxygen level.  Thoracentesis. This procedureremoves fluid around one lung or both lungs to help you breathe better. HOME CARE INSTRUCTIONS  Take over-the-counter and prescription medicines only as told by your health care provider.  Only takecough medicine if you are losing sleep. Understand that cough medicine can prevent your body's natural ability to remove mucus from your lungs.  If you were prescribed an antibiotic medicine, take it as told by your health care provider. Do not stop taking the antibiotic even if you start to feel better.  Sleep in a semi-upright position at night. Try sleeping in a reclining chair, or place a few pillows under your head.  Do not use tobacco products, including cigarettes, chewing tobacco, and e-cigarettes. If you need help quitting,  ask your health care  provider.  Drink enough water to keep your urine clear or pale yellow. This will help to thin out mucus secretions in your lungs. PREVENTION There are ways that you can decrease your risk of developing community-acquired pneumonia. Consider getting a pneumococcal vaccine if:  You are older than 65 years of age.  You are older than 65 years of age and are undergoing cancer treatment, have chronic lung disease, or have other medical conditions that affect your immune system. Ask your health care provider if this applies to you. There are different types and schedules of pneumococcal vaccines. Ask your health care provider which vaccination option is best for you. You may also prevent community-acquired pneumonia if you take these actions:  Get an influenza vaccine every year. Ask your health care provider which type of influenza vaccine is best for you.  Go to the dentist on a regular basis.  Wash your hands often. Use hand sanitizer if soap and water are not available. SEEK MEDICAL CARE IF:  You have a fever.  You are losing sleep because you cannot control your cough with cough medicine. SEEK IMMEDIATE MEDICAL CARE IF:  You have worsening shortness of breath.  You have increased chest pain.  Your sickness becomes worse, especially if you are an older adult or have a weakened immune system.  You cough up blood.   This information is not intended to replace advice given to you by your health care provider. Make sure you discuss any questions you have with your health care provider.   Document Released: 12/03/2005 Document Revised: 08/24/2015 Document Reviewed: 03/30/2015 Elsevier Interactive Patient Education 2016 [employer].   IF you received an x-[name] today, you will receive an invoice from [facility]. Please contact [facility] at [phone] with questions or concerns regarding your invoice.   IF you received labwork today, you will receive an invoice  from [employer]. Please contact [facility] at [phone] with questions or concerns regarding your invoice.   Our billing staff will not be able to assist you with questions regarding bills from these companies.  You will be contacted with the lab results as soon as they are available. The fastest way to get your results is to activate your My Chart account. Instructions are located on the last page of this paperwork. If you have not heard from [place] regarding the results in 2 weeks, please contact this office.       I personally performed the services described in this documentation, which was scribed in my presence. The recorded information has been reviewed and considered, and addended by me as needed.   Signed,   Danny Ray, MD [facility].  07/24/16 3:24 PM

## 2016-08-02 ENCOUNTER — Encounter: Payer: Self-pay | Admitting: Physician Assistant

## 2016-08-02 ENCOUNTER — Telehealth: Payer: Self-pay

## 2016-08-02 NOTE — Telephone Encounter (Signed)
Pt is needing a refill on his z-pack  Best number [phone] or [phone]

## 2016-08-07 NOTE — Telephone Encounter (Signed)
Dr Carlota Raspberry already addressed this by [place] email.

## 2016-09-03 ENCOUNTER — Encounter: Payer: Self-pay | Admitting: Neurology

## 2016-09-03 ENCOUNTER — Ambulatory Visit (INDEPENDENT_AMBULATORY_CARE_PROVIDER_SITE_OTHER): Payer: 59 | Admitting: Neurology

## 2016-09-03 DIAGNOSIS — G20 Parkinson's disease: Secondary | ICD-10-CM | POA: Diagnosis not present

## 2016-09-03 DIAGNOSIS — G4752 REM sleep behavior disorder: Secondary | ICD-10-CM | POA: Diagnosis not present

## 2016-09-03 MED ORDER — RASAGILINE MESYLATE 1 MG PO TABS: ORAL_TABLET | ORAL | 3 refills | Status: DC

## 2016-09-03 MED ORDER — PRAMIPEXOLE DIHYDROCHLORIDE 0.75 MG PO TABS: 0.7500 mg | ORAL_TABLET | Freq: Three times a day (TID) | ORAL | 3 refills | Status: DC

## 2016-09-03 NOTE — Progress Notes (Signed)
Subjective:    Patient ID: Danny [name] is a 65 y.o. male.  HPI     Interim history:   Danny [name] is a very pleasant 65 year old right-handed gentleman with an underlying medical history of type 2 diabetes, osteoarthritis, hypertension, obesity, hyperlipidemia, and cataracts, who presents for follow-up consultation of his Parkinson's disease, with history of RBD and on treatment with Mirapex low-dose. The patient is accompanied by his wife today. I last saw him on 02/27/2016, at which time he reported more difficulty with his walking, he had become slower. Wife had noted that it would take him longer to do things. He was dragging his feet at times. He was not swinging his arms as well. He was working full-time as a Land, avoiding stairs and heights. His A1c in December 2016 was a little up at 7.6. Memory was stable, with some mild forgetfulness. Mood was stable, RBD was infrequent. He reported no recent falls. I suggested he continue with Azilect and we increased his Mirapex to 0.75 mg 3 times a day.   Today, 09/03/2016: He reports doing well. No big change after the increase in the Mirapex. Gave up driving a couple of months ago, will be retiring by the end of December. His wife's mom is 35 years old and may be moving in soon with them. Memory stable, sleep good, no mood issues. He has had no recent falls, overall feels quite stable.   Previously:   I saw him on 08/29/2015 at which time he reported doing fairly well. He was able to tolerate Azilect. His Mirapex was 0.5 mg 3 times a day. He was working at the computer most of the time. He was not always drinking enough water. He had a good appetite. He felt his memory was stable. RBD was less frequent but still occurring. His wife was able to sleep in the same bed with him. He had no recent falls. He had some recent blurry vision was going to see his ophthalmologist. We mutually agreed to continue his current medication regimen.   I saw him on 04/25/2015, at which time he reported doing fairly well overall, but had noted smaller changes, including more slowness, difficulty with climbing ladders. He was advised by his boss to not climb ladders or walk over obstacles at work. Thankfully, his work environment is understanding. He had another business trip coming up and a colleague was to drive and do the climbing on the sites. He reported mild forgetfulness and some problems with depth perception. His diplopia had improved since he was given prism glasses by his ophthalmologist. I suggested, we try him on Azilect and keep his Mirapex the same.    I saw him on 02/04/2015, at which time he reported ongoing issues with diplopia, particularly at night while driving. He was still working full-time. He was still on low-dose Mirapex, 0.25 mg 3 times a day. He had no side effects. He had had a sleep study which per his report did not show any OSA. I asked him to make an appointment with his eye doctor. I suggested an increase in Mirapex to 0.5 mg 3 times a day.   I first met him on 12/28/2014, at which time he reported that he was having more fatigue. However, he also had reduce his caffeine intake. He had no recent falls and mood stable. He was working full-time. He reported that he had intermittent double vision in the last 3 months. His PCP requested a sooner than scheduled  appointment for diplopia. His exam was stable at the time and I suggested no new medication changes.   He has had some short-term memory issues. He works full-time. He is an Chief Financial Officer. He likes square dance but his wife has noted that he has had more problems with his nighttime driving and his squared hands. In the recent 3 months he has had some intermittent double vision at night. He has drooling at night. He rarely drinks alcohol and usually drinks 4-5 cups of coffee per day, occasional sodas. He quit smoking in 1978. Blood pressure is low for him today. It was recently  noted to be trending lower and his blood pressure medication was reduced. He takes his cholesterol medication every other day.   He had a brain MRI without contrast on 11/04/2013: Normal MRI scan of the brain. Incidental findings of a chronic paranasal sinusitis with deviated nasal septum to the left.  He previously followed with Dr. Jim Like and was last seen by him on 08/30/2014, at which time his Mirapex was kept at 0.25 mg 3 times a day. He reported no side effects, in particular no impulse control disorder. I reviewed Dr. Hazle Quant notes. He first met Dr. Janann Colonel on 10/26/13, at which time the patient reported a right hand tremor noticed over the previous 4-6 months. He had changes in his handwriting, slowness, some stiffness and changes in his walking.  His wife reported a longer standing history of REM behavior disorder. He has been in outpatient physical therapy.  His Past Medical History Is Significant For: Past Medical History:  Diagnosis Date  . Cataract   . Essential hypertension, benign   . Mixed hyperlipidemia   . OA (osteoarthritis) of knee    right  . Obesity, unspecified   . Parkinsons ([place])   . Type II or unspecified type diabetes mellitus without mention of complication, not stated as uncontrolled     His Past Surgical History Is Significant For: Past Surgical History:  Procedure Laterality Date  . GANGLION CYST EXCISION  age 19 years   right  . KNEE ARTHROSCOPY     right  . WRIST SURGERY  04/2012    His Family History Is Significant For: Family History  Problem Relation Age of Onset  . Diabetes Sister   . Diabetes Daughter   . Cancer Father   . Heart disease Father   . Diabetes Mother     His Social History Is Significant For: Social History   Social History  . Marital status: Married    Spouse name: [name]  . Number of children: 3  . Years of education: 51   Occupational History  . Engineer    Social History Main Topics  . Smoking status: Former  Smoker    Packs/day: 1.50    Years: 15.00    Types: Cigarettes    Quit date: 12/17/1982  . Smokeless tobacco: Never Used     Comment: over 40 yrs  . Alcohol use No     Comment: very rare  . Drug use: No  . Sexual activity: Yes    Partners: Female   Other Topics Concern  . None   Social History Narrative   Research officer, trade union, motorcycle rider.  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  He has 3 adult children.  His wife had 2 children, one of whom is deceased.   Patient is working full-time.   Patient has a Bachelor's degree.   Patient is right-handed.   Patient drinks 6  cups of coffee daily.    His Allergies Are:  Allergies  Allergen Reactions  . Codeine Nausea And Vomiting  :   His Current Medications Are:  Outpatient Encounter Prescriptions as of 09/03/2016  Medication Sig  . aspirin 81 MG tablet Take 81 mg by mouth daily.    . Cinnamon 500 MG capsule Take 500 mg by mouth daily.  . enalapril (VASOTEC) 2.5 MG tablet Take 1 tablet by mouth  daily  . Ginger, Zingiber officinalis, 550 MG CAPS Take 550 mg by mouth daily.    Marland Kitchen glipiZIDE (GLUCOTROL XL) 5 MG 24 hr tablet TAKE 1 TABLET BY MOUTH DAILY  . Glucosamine-Chondroit-Vit C-Mn (GLUCOSAMINE CHONDR 1500 COMPLX PO) Take 1,500 mg by mouth daily.    Marland Kitchen glucose blood test strip Use as instructed  . JANUMET 50-1000 MG tablet Take 1 tablet by mouth two  times daily with meals  . Multiple Vitamin (MULTIVITAMIN) tablet Take 1 tablet by mouth daily.  . pramipexole (MIRAPEX) 0.75 MG tablet Take 1 tablet (0.75 mg total) by mouth 3 (three) times daily.  . rasagiline (AZILECT) 1 MG TABS tablet 1 pill once daily  . simvastatin (ZOCOR) 40 MG tablet Take one-half tablet by  mouth daily  . [DISCONTINUED] azithromycin (ZITHROMAX) 250 MG tablet Take 2 pills by mouth on day 1, then 1 pill by mouth per day on days 2 through 5.  . [DISCONTINUED] benzonatate (TESSALON) 100 MG capsule Take 1 capsule (100 mg total) by mouth 3 (three) times daily as needed for cough.   . [DISCONTINUED] Pseudoephedrine-APAP-DM (DAYQUIL MULTI-SYMPTOM COLD/FLU PO) Take by mouth.   No facility-administered encounter medications on file as of 09/03/2016.   :  Review of Systems:  Out of a complete 14 point review of systems, all are reviewed and negative with the exception of these symptoms as listed below:  Review of Systems  Neurological:       Patient has not noticed any difference since the increase in Mirapex. No new concerns. Patient will retire in December.     Objective:  Neurologic Exam  Physical Exam Physical Examination:   Vitals:   09/03/16 0817  BP: 122/68  Pulse: 80  Resp: 16    General Examination: The patient is a very pleasant 65 y.o. male in no acute distress. He is in good spirits today.   HEENT: Normocephalic, atraumatic, pupils are equal, round and reactive to light and accommodation. Extraocular tracking shows mild saccadic breakdown without nystagmus noted. There is limitation to upper gaze. There is mild decrease in eye blink rate. He had bilateral cataract repairs. Hearing is intact. Face is symmetric with mild facial masking and normal facial sensation. There is no lip, neck or jaw tremor. Neck is mild to moderately rigid with intact passive ROM. There are no carotid bruits on auscultation. Oropharynx exam reveals moderate mouth dryness. No significant airway crowding is noted. Mallampati is class II. Tongue protrudes centrally and palate elevates symmetrically. There is no drooling.   Chest: is clear to auscultation without wheezing, rhonchi or crackles noted.  Heart: sounds are regular and normal without murmurs, rubs or gallops noted.   Abdomen: is soft, non-tender and non-distended with normal bowel sounds appreciated on auscultation.  Extremities: There is no pitting edema in the distal lower extremities bilaterally. Pedal pulses are intact.   Skin: is warm and dry with no trophic changes noted.   Musculoskeletal: exam reveals no  obvious joint deformities, tenderness, joint swelling or erythema, with the exception of right  wrist decrease in range of motion which is not new.  Neurologically:  Mental status: The patient is awake and alert, paying good  attention. He is able to completely provide the history. His wife provides details He is oriented to: person, place, time/date, situation, day of week, month of year and year. His memory, attention, language and knowledge are intact. There is no aphasia, agnosia, apraxia or anomia. There is a no significant degree of bradyphrenia. Speech is mildly hypophonic with no dysarthria noted. Mood is congruent and affect is normal.   Cranial nerves are as described above under HEENT exam. In addition, shoulder shrug is normal with equal shoulder height noted.  Motor exam: Normal bulk, and strength for age is noted. There are no dyskinesias.  Tone is mildly rigid with presence of cogwheeling in the right upper extremity. There is overall mild bradykinesia. There is no drift or rebound.  There is a very slight intermittent right upper extremity resting tremor, not changed. He has no postural or action tremor. Romberg is negative. Reflexes are 2+ throughout, trace in the ankles.  Fine motor skills exam: Finger taps are mild to moderately impaired on the right and Minimally impaired on the left. Hand movements are mildly impaired on the right and Minimally impaired on the left. RAP (rapid alternating patting) is Minimally impaired on the right and Not impaired on the left. Foot taps are mildly impaired on the right and Minimally impaired on the left. Foot agility (in the form of heel stomping) is Minimally to mildly impaired on the right and Not impaired on the left.    Cerebellar testing shows no dysmetria or intention tremor on finger to nose testing. Heel to shin is unremarkable bilaterally. There is no truncal or gait ataxia.   Sensory exam is intact to light touch in the upper and lower  extremities.   Gait, station and balance: He stands up from the seated position with mild difficulty and does need to push up with His hands. He needs no assistance. No veering to one side is noted. He is not noted to lean to the side. Posture is mild to moderately stooped, stable. Stance is slightly wide-based. He walks with decrease in stride length and pace and decrseased arm swing on the right. He turns in 3 steps. Tandem walk is not possible safely. Balance is mildly impaired, but stable.  Assessment and Plan:   In summary, Danny [name] is a very pleasant 65 year old male with an underlying medical history of type 2 diabetes, osteoarthritis, hypertension, obesity, hyperlipidemia, and cataracts, who presents for follow-up consultation of his right-sided predominant Parkinson's disease with mildly progressive symptoms, Stable lately. He has had diplopia off and on but this improved after he got his prism glasses. We increase the Mirapex to 0.75 mg 3 times a day 6 months ago in March 2017 and he has been stable, has not noticed much in the way of significant change in so we increase the Mirapex slightly. We will continue with the current regimen of Azilect 1 mg once daily and Mirapex 0.75 mg 3 times a day, at some point in the future we will likely add levodopa therapy to his regimen. For now, I suggested he try to increase his water intake, decrease his coffee intake, and stay active mentally and physically especially in light of retirement coming up soon, I would like for him to stay physically active as he will be at risk for more sedentary lifestyle after retirement which is  natural but not good for his Parkinson's disease. He has not been driving and gave this up on his own a couple months ago, I appreciate his judgment in that regard.  I would like to see him back in 6 months, sooner if the need arises. He is encouraged to call or email with any interim questions or concerns. I answered all  their  questions today and the patient and his wife were in agreement.  I spent 25 minutes in total face-to-face time with the patient, more than 50% of which was spent in counseling and coordination of care, reviewing test results, reviewing medication and discussing or reviewing the diagnosis of PD, its prognosis and treatment options.

## 2016-09-03 NOTE — Patient Instructions (Signed)
Your exam is stable, we will keep the meds the same for now.

## 2016-09-21 ENCOUNTER — Encounter: Payer: Self-pay | Admitting: Physician Assistant

## 2016-10-02 ENCOUNTER — Ambulatory Visit (INDEPENDENT_AMBULATORY_CARE_PROVIDER_SITE_OTHER): Payer: 59 | Admitting: Physician Assistant

## 2016-10-02 ENCOUNTER — Encounter: Payer: Self-pay | Admitting: Physician Assistant

## 2016-10-02 VITALS — BP 124/80 | HR 98 | Temp 97.9°F | Resp 18 | Ht 68.75 in | Wt 232.0 lb

## 2016-10-02 DIAGNOSIS — E119 Type 2 diabetes mellitus without complications: Secondary | ICD-10-CM | POA: Diagnosis not present

## 2016-10-02 DIAGNOSIS — E669 Obesity, unspecified: Secondary | ICD-10-CM

## 2016-10-02 DIAGNOSIS — E782 Mixed hyperlipidemia: Secondary | ICD-10-CM | POA: Diagnosis not present

## 2016-10-02 DIAGNOSIS — I10 Essential (primary) hypertension: Secondary | ICD-10-CM | POA: Diagnosis not present

## 2016-10-02 LAB — CBC WITH DIFFERENTIAL/PLATELET
BASOS ABS: 0 {cells}/uL (ref 0–200)
Basophils Relative: 0 %
EOS PCT: 3 %
Eosinophils Absolute: 252 cells/uL (ref 15–500)
HCT: 42.3 % (ref 38.5–50.0)
Hemoglobin: 14.3 g/dL (ref 13.2–17.1)
LYMPHS PCT: 22 %
Lymphs Abs: 1848 cells/uL (ref 850–3900)
MCH: 30.9 pg (ref 27.0–33.0)
MCHC: 33.8 g/dL (ref 32.0–36.0)
MCV: 91.4 fL (ref 80.0–100.0)
MONOS PCT: 7 %
MPV: 10.9 fL (ref 7.5–12.5)
Monocytes Absolute: 588 cells/uL (ref 200–950)
NEUTROS ABS: 5712 {cells}/uL (ref 1500–7800)
Neutrophils Relative %: 68 %
PLATELETS: 197 10*3/uL (ref 140–400)
RBC: 4.63 MIL/uL (ref 4.20–5.80)
RDW: 14.6 % (ref 11.0–15.0)
WBC: 8.4 10*3/uL (ref 3.8–10.8)

## 2016-10-02 LAB — TSH: TSH: 4.34 m[IU]/L (ref 0.40–4.50)

## 2016-10-02 NOTE — Progress Notes (Signed)
Patient ID: Danny [name], male    DOB: 07-22-1951, 65 y.o.   MRN: [id number]  PCP: [name], PA-C  Subjective:   Chief Complaint  Patient presents with  . Follow-up    DIABETES    HPI Presents for evaluation of diabetes.  Overall, doing well. Parkinsons continues to be the primary problem for him, with significant impact on his lifestyle. No longer square dances or rides a motorcycle, two of his favorite activities prior to diagnosis.  No CP, SOB, HA, dizziness. No GI/GU symptoms. No new muscle or joint pains.    Review of Systems Denies chest pain, shortness of breath, HA, dizziness, vision change, nausea, vomiting, diarrhea, constipation, melena, hematochezia, dysuria, increased urinary urgency or frequency, increased hunger or thirst, unintentional weight change, unexplained myalgias or arthralgias, rash.     Patient Active Problem List   Diagnosis Date Noted  . Hematospermia 11/18/2015  . Epididymal cyst 05/25/2015  . Parkinson disease ([place]) 01/14/2014  . Obesity (BMI 30-39.9) 07/14/2013  . Confusional arousals 02/13/2013  . Disorder of ligament of right wrist 01/29/2012  . Diabetes mellitus type 2, uncomplicated ([place]) 99991111  . Mixed hyperlipidemia 10/09/2011  . OA (osteoarthritis) of knee 10/09/2011  . Essential hypertension, benign 10/09/2011     Prior to Admission medications   Medication Sig Start Date End Date Taking? Authorizing Provider  aspirin 81 MG tablet Take 81 mg by mouth daily.     Yes Historical Provider, MD  Cinnamon 500 MG capsule Take 500 mg by mouth daily.   Yes Historical Provider, MD  enalapril (VASOTEC) 2.5 MG tablet Take 1 tablet by mouth  daily 05/08/16  Yes Wardell Honour, MD  Ginger, Zingiber officinalis, 550 MG CAPS Take 550 mg by mouth daily.     Yes Historical Provider, MD  glipiZIDE (GLUCOTROL XL) 5 MG 24 hr tablet TAKE 1 TABLET BY MOUTH DAILY 02/21/16  Yes Wardell Honour, MD  Glucosamine-Chondroit-Vit C-Mn  (GLUCOSAMINE CHONDR 1500 COMPLX PO) Take 1,500 mg by mouth daily.     Yes Historical Provider, MD  glucose blood test strip Use as instructed 09/15/13  Yes Ryan Lyn Hollingshead, PA-C  JANUMET 50-1000 MG tablet Take 1 tablet by mouth two  times daily with meals 07/26/16  Yes Wendie Agreste, MD  Multiple Vitamin (MULTIVITAMIN) tablet Take 1 tablet by mouth daily.   Yes Historical Provider, MD  pramipexole (MIRAPEX) 0.75 MG tablet Take 1 tablet (0.75 mg total) by mouth 3 (three) times daily. 09/03/16  Yes [name], MD  rasagiline (AZILECT) 1 MG TABS tablet 1 pill once daily 09/03/16  Yes [name], MD  simvastatin (ZOCOR) 40 MG tablet Take one-half tablet by  mouth daily 07/26/16  Yes Wendie Agreste, MD     Allergies  Allergen Reactions  . Codeine Nausea And Vomiting       Objective:  Physical Exam  Constitutional: He is oriented to person, place, and time. He appears well-developed and well-nourished. He is active and cooperative. No distress.  BP 124/80 (BP Location: Right Arm, Patient Position: Sitting, Cuff Size: Small)   Pulse 98   Temp 97.9 F (36.6 C) (Oral)   Resp 18   Ht 5' 8.75" (1.746 m)   Wt 232 lb (105.2 kg)   SpO2 95%   BMI 34.51 kg/m   HENT:  Head: Normocephalic and atraumatic.  Right Ear: Hearing normal.  Left Ear: Hearing normal.  Eyes: Conjunctivae are normal. No scleral icterus.  Neck: Normal range of  motion. Neck supple. No thyromegaly present.  Cardiovascular: Normal rate, regular rhythm and normal heart sounds.   Pulses:      Radial pulses are 2+ on the right side, and 2+ on the left side.  Pulmonary/Chest: Effort normal and breath sounds normal.  Lymphadenopathy:       Head (right side): No tonsillar, no preauricular, no posterior auricular and no occipital adenopathy present.       Head (left side): No tonsillar, no preauricular, no posterior auricular and no occipital adenopathy present.    He has no cervical adenopathy.       Right: No supraclavicular  adenopathy present.       Left: No supraclavicular adenopathy present.  Neurological: He is alert and oriented to person, place, and time. No sensory deficit.  Skin: Skin is warm, dry and intact. No rash noted. No cyanosis or erythema. Nails show no clubbing.  Psychiatric: His speech is normal and behavior is normal. His affect is blunt.           Assessment & Plan:   1. Type 2 diabetes mellitus without complication, without long-term current use of insulin ([place]) Await labs. Adjust regimen as indicated by results. - Comprehensive metabolic panel - Hemoglobin A1c - Microalbumin, urine  2. Essential hypertension, benign Controlled. - Comprehensive metabolic panel - CBC with Differential/Platelet - TSH  3. Mixed hyperlipidemia Await labs. Adjust regimen as indicated by results. - Comprehensive metabolic panel - Lipid panel  4. Obesity (BMI 30-39.9) Healthy eating. Stay as active as his mobility allows him to be.   [name], PA-C Physician Assistant-Certified [facility]

## 2016-10-02 NOTE — Patient Instructions (Signed)
     IF you received an x-ray today, you will receive an invoice from [facility]. Please contact [facility] at [phone] with questions or concerns regarding your invoice.   IF you received labwork today, you will receive an invoice from [facility]. Please contact [facility] at [phone] with questions or concerns regarding your invoice.   Our billing staff will not be able to assist you with questions regarding bills from these companies.  You will be contacted with the lab results as soon as they are available. The fastest way to get your results is to activate your My Chart account. Instructions are located on the last page of this paperwork. If you have not heard from us regarding the results in 2 weeks, please contact this office.      

## 2016-10-03 LAB — COMPREHENSIVE METABOLIC PANEL
ALBUMIN: 3.9 g/dL (ref 3.6–5.1)
ALK PHOS: 47 U/L (ref 40–115)
ALT: 33 U/L (ref 9–46)
AST: 24 U/L (ref 10–35)
BILIRUBIN TOTAL: 1.2 mg/dL (ref 0.2–1.2)
BUN: 15 mg/dL (ref 7–25)
CALCIUM: 9.9 mg/dL (ref 8.6–10.3)
CO2: 23 mmol/L (ref 20–31)
CREATININE: 1.03 mg/dL (ref 0.70–1.25)
Chloride: 104 mmol/L (ref 98–110)
GLUCOSE: 103 mg/dL — AB (ref 65–99)
Potassium: 4.5 mmol/L (ref 3.5–5.3)
Sodium: 138 mmol/L (ref 135–146)
Total Protein: 6.7 g/dL (ref 6.1–8.1)

## 2016-10-03 LAB — LIPID PANEL
Cholesterol: 115 mg/dL — ABNORMAL LOW (ref 125–200)
HDL: 53 mg/dL (ref >=40)
LDL CALC: 42 mg/dL (ref <130)
TRIGLYCERIDES: 98 mg/dL (ref <150)
Total CHOL/HDL Ratio: 2.2 Ratio (ref <=5.0)
VLDL: 20 mg/dL (ref <30)

## 2016-10-03 LAB — HEMOGLOBIN A1C
HEMOGLOBIN A1C: 7.1 % — AB (ref <5.7)
Mean Plasma Glucose: 157 mg/dL

## 2016-10-03 LAB — MICROALBUMIN, URINE: Microalb, Ur: 0.8 mg/dL

## 2016-12-06 ENCOUNTER — Ambulatory Visit (INDEPENDENT_AMBULATORY_CARE_PROVIDER_SITE_OTHER): Payer: 59 | Admitting: Family Medicine

## 2016-12-06 VITALS — BP 128/76 | HR 96 | Temp 98.7°F | Resp 16 | Ht 68.75 in | Wt 228.4 lb

## 2016-12-06 DIAGNOSIS — R059 Cough, unspecified: Secondary | ICD-10-CM

## 2016-12-06 DIAGNOSIS — R05 Cough: Secondary | ICD-10-CM

## 2016-12-06 MED ORDER — BENZONATATE 100 MG PO CAPS
100.0000 mg | ORAL_CAPSULE | Freq: Three times a day (TID) | ORAL | 0 refills | Status: DC | PRN
Start: 2016-12-06 — End: 2017-03-04

## 2016-12-06 MED ORDER — ALBUTEROL SULFATE HFA 108 (90 BASE) MCG/ACT IN AERS: 2.0000 | INHALATION_SPRAY | RESPIRATORY_TRACT | 1 refills | Status: DC | PRN

## 2016-12-06 NOTE — Progress Notes (Signed)
Patient ID: Danny [name], male    DOB: 12-10-1951, 65 y.o.   MRN: [id number]  PCP: [name], PA-C  Chief Complaint  Patient presents with  . Cough    Dry non productive x 1week following 2 days of unspecified fever    Subjective:  HPI 65 year old male present for evaluation of non-produce dry cough times 2 days. Everybody in household ill. He started taking Mucinex DM, seemed to improve, complains of residual cough, runny nose with nasal congestion. No sore throat, fever, although he complains of wheezing and shortness of breath intermittently. Cough is patients most worrisome symptom.  Social History   Social History  . Marital status: Married    Spouse name: [name]  . Number of children: 3  . Years of education: 81   Occupational History  . Engineer    Social History Main Topics  . Smoking status: Former Smoker    Packs/day: 1.50    Years: 15.00    Types: Cigarettes    Quit date: 12/17/1982  . Smokeless tobacco: Never Used     Comment: over 40 yrs  . Alcohol use No     Comment: very rare  . Drug use: No  . Sexual activity: Yes    Partners: Female   Other Topics Concern  . Not on file   Social History Narrative   [name], motorcycle rider.  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  He has 3 adult children.  His wife had 2 children, one of whom is deceased.   Patient is working full-time.   Patient has a Bachelor's degree.   Patient is right-handed.   Patient drinks 6 cups of coffee daily.    Family History  Problem Relation Age of Onset  . Diabetes Sister   . Diabetes Daughter   . Cancer Father   . Heart disease Father   . Diabetes Mother    Review of Systems  See HPI  Patient Active Problem List   Diagnosis Date Noted  . Hematospermia 11/18/2015  . Epididymal cyst 05/25/2015  . Parkinson disease ([place]) 01/14/2014  . Obesity (BMI 30-39.9) 07/14/2013  . Confusional arousals 02/13/2013  . Disorder of ligament of right wrist 01/29/2012  .  Diabetes mellitus type 2, uncomplicated ([place]) 99991111  . Mixed hyperlipidemia 10/09/2011  . OA (osteoarthritis) of knee 10/09/2011  . Essential hypertension, benign 10/09/2011    Allergies  Allergen Reactions  . Codeine Nausea And Vomiting    Prior to Admission medications   Medication Sig Start Date End Date Taking? Authorizing Provider  aspirin 81 MG tablet Take 81 mg by mouth daily.     Yes Historical Provider, MD  Cinnamon 500 MG capsule Take 500 mg by mouth daily.   Yes Historical Provider, MD  enalapril (VASOTEC) 2.5 MG tablet Take 1 tablet by mouth  daily 05/08/16  Yes Wardell Honour, MD  Ginger, Zingiber officinalis, 550 MG CAPS Take 550 mg by mouth daily.     Yes Historical Provider, MD  glipiZIDE (GLUCOTROL XL) 5 MG 24 hr tablet TAKE 1 TABLET BY MOUTH DAILY 02/21/16  Yes Wardell Honour, MD  Glucosamine-Chondroit-Vit C-Mn (GLUCOSAMINE CHONDR 1500 COMPLX PO) Take 1,500 mg by mouth daily.     Yes Historical Provider, MD  glucose blood test strip Use as instructed 09/15/13  Yes Ryan Lyn Hollingshead, PA-C  JANUMET 50-1000 MG tablet Take 1 tablet by mouth two  times daily with meals 07/26/16  Yes Wendie Agreste, MD  Multiple Vitamin (MULTIVITAMIN) tablet Take 1 tablet by mouth daily.   Yes Historical Provider, MD  pramipexole (MIRAPEX) 0.75 MG tablet Take 1 tablet (0.75 mg total) by mouth 3 (three) times daily. 09/03/16  Yes [name], MD  rasagiline (AZILECT) 1 MG TABS tablet 1 pill once daily 09/03/16  Yes [name], MD  simvastatin (ZOCOR) 40 MG tablet Take one-half tablet by  mouth daily 07/26/16  Yes Wendie Agreste, MD  Past Medical, Surgical Family and Social History reviewed and updated.  Objective:   Today's Vitals   12/06/16 1544  BP: 128/76  Pulse: 96  Resp: 16  Temp: 98.7 F (37.1 C)  TempSrc: Oral  SpO2: 94%  Weight: 228 lb 6.4 oz (103.6 kg)  Height: 5' 8.75" (1.746 m)    Wt Readings from Last 3 Encounters:  12/06/16 228 lb 6.4 oz (103.6 kg)  10/02/16 232 lb  (105.2 kg)  09/03/16 230 lb (104.3 kg)   Physical Exam  Constitutional: He is oriented to person, place, and time. He appears well-developed and well-nourished.  HENT:  Head: Normocephalic and atraumatic.  Nose: Nose normal.  Mouth/Throat: Oropharynx is clear and moist.  Eyes: Conjunctivae and EOM are normal. Pupils are equal, round, and reactive to light.  Neck: Normal range of motion. Neck supple.  Cardiovascular: Normal rate, regular rhythm, normal heart sounds and intact distal pulses.   Pulmonary/Chest: Effort normal. He has wheezes.  Lymphadenopathy:    He has no cervical adenopathy.  Neurological: He is alert and oriented to person, place, and time.  Skin: Skin is warm and dry.  Psychiatric: He has a normal mood and affect. His behavior is normal. Judgment and thought content normal.     Assessment & Plan:  1. Cough -Benzonatate (Tessalon) 100-200 mg 3 times daily, prn   2. Wheeze -Albuterol (Proventil HFA;Ventolin HFA) 108 (90 Base) MCG/ACT  Follow-up as needed.  Carroll Sage. Kenton Kingfisher, MSN, FNP-C [facility]

## 2016-12-06 NOTE — Patient Instructions (Addendum)
May use 2 puffs albuterol 4-6 hours as needed for shortness of breath or wheezing.  Benzonatate 100-200 mg up to 3 times daily for cough.   IF you received an x-ray today, you will receive an invoice from [facility]. Please contact [facility] at [phone] with questions or concerns regarding your invoice.   IF you received labwork today, you will receive an invoice from [facility]. Please contact [facility] at [phone] with questions or concerns regarding your invoice.   Our billing staff will not be able to assist you with questions regarding bills from these companies.  You will be contacted with the lab results as soon as they are available. The fastest way to get your results is to activate your My Chart account. Instructions are located on the last page of this paperwork. If you have not heard from [place] regarding the results in 2 weeks, please contact this office.      Cough, Adult Introduction A cough helps to clear your throat and lungs. A cough may last only 2-3 weeks (acute), or it may last longer than 8 weeks (chronic). Many different things can cause a cough. A cough may be a sign of an illness or another medical condition. Follow these instructions at home:  Pay attention to any changes in your cough.  Take medicines only as told by your doctor.  If you were prescribed an antibiotic medicine, take it as told by your doctor. Do not stop taking it even if you start to feel better.  Talk with your doctor before you try using a cough medicine.  Drink enough fluid to keep your pee (urine) clear or pale yellow.  If the air is dry, use a cold steam vaporizer or humidifier in your home.  Stay away from things that make you cough at work or at home.  If your cough is worse at night, try using extra pillows to raise your head up higher while you sleep.  Do not smoke, and try not to be around smoke. If you need help quitting, ask your doctor.  Do not have  caffeine.  Do not drink alcohol.  Rest as needed. Contact a doctor if:  You have new problems (symptoms).  You cough up yellow fluid (pus).  Your cough does not get better after 2-3 weeks, or your cough gets worse.  Medicine does not help your cough and you are not sleeping well.  You have pain that gets worse or pain that is not helped with medicine.  You have a fever.  You are losing weight and you do not know why.  You have night sweats. Get help right away if:  You cough up blood.  You have trouble breathing.  Your heartbeat is very fast. This information is not intended to replace advice given to you by your health care provider. Make sure you discuss any questions you have with your health care provider. Document Released: 08/16/2011 Document Revised: 05/10/2016 Document Reviewed: 02/09/2015  2017 Elsevier

## 2017-01-08 ENCOUNTER — Ambulatory Visit: Payer: 59 | Admitting: Physician Assistant

## 2017-01-10 ENCOUNTER — Encounter: Payer: Self-pay | Admitting: Physician Assistant

## 2017-01-10 ENCOUNTER — Ambulatory Visit (INDEPENDENT_AMBULATORY_CARE_PROVIDER_SITE_OTHER): Payer: Medicare Other | Admitting: Physician Assistant

## 2017-01-10 VITALS — BP 108/64 | HR 78 | Temp 98.0°F | Resp 16 | Wt 227.4 lb

## 2017-01-10 DIAGNOSIS — G20 Parkinson's disease: Secondary | ICD-10-CM | POA: Diagnosis not present

## 2017-01-10 DIAGNOSIS — E782 Mixed hyperlipidemia: Secondary | ICD-10-CM | POA: Diagnosis not present

## 2017-01-10 DIAGNOSIS — G20A1 Parkinson's disease without dyskinesia, without mention of fluctuations: Secondary | ICD-10-CM

## 2017-01-10 DIAGNOSIS — Z23 Encounter for immunization: Secondary | ICD-10-CM

## 2017-01-10 DIAGNOSIS — E669 Obesity, unspecified: Secondary | ICD-10-CM | POA: Diagnosis not present

## 2017-01-10 DIAGNOSIS — E119 Type 2 diabetes mellitus without complications: Secondary | ICD-10-CM

## 2017-01-10 DIAGNOSIS — I10 Essential (primary) hypertension: Secondary | ICD-10-CM | POA: Diagnosis not present

## 2017-01-10 NOTE — Progress Notes (Signed)
Patient ID: Danny [name], male    DOB: 1951/02/07, 66 y.o.   MRN: [id number]  PCP: [name], PA-C  Chief Complaint  Patient presents with  . Diabetes    3 MONTH CHECK    Subjective:   Presents for evaluation of diabetes.  He is accompanied by his wife, [name].  Reports continued tolerance to medications. No longer checks home glucose. No polydipsia, polyuria, urinary urgency or burning.  Notes that Parkinson's symptoms are progressing, specifically more difficulty walking and increased drooling.    Review of Systems Denies chest pain, shortness of breath, HA, dizziness, nausea, vomiting, diarrhea, constipation, melena, hematochezia, dysuria, increased urinary urgency or frequency, increased hunger or thirst, unintentional weight change, unexplained myalgias or arthralgias, rash.     Patient Active Problem List   Diagnosis Date Noted  . Hematospermia 11/18/2015  . Epididymal cyst 05/25/2015  . Parkinson disease ([place]) 01/14/2014  . Obesity (BMI 30-39.9) 07/14/2013  . Confusional arousals 02/13/2013  . Disorder of ligament of right wrist 01/29/2012  . Diabetes mellitus type 2, uncomplicated ([place]) 99991111  . Mixed hyperlipidemia 10/09/2011  . OA (osteoarthritis) of knee 10/09/2011  . Essential hypertension, benign 10/09/2011     Prior to Admission medications   Medication Sig Start Date End Date Taking? Authorizing Provider  benzonatate (TESSALON) 100 MG capsule Take 1-2 capsules (100-200 mg total) by mouth 3 (three) times daily as needed for cough. 12/06/16  Yes Sedalia Muta, FNP  Cinnamon 500 MG capsule Take 500 mg by mouth daily.   Yes Historical Provider, MD  enalapril (VASOTEC) 2.5 MG tablet Take 1 tablet by mouth  daily 05/08/16  Yes Wardell Honour, MD  Ginger, Zingiber officinalis, 550 MG CAPS Take 550 mg by mouth daily.     Yes Historical Provider, MD  Glucosamine-Chondroit-Vit C-Mn (GLUCOSAMINE CHONDR 1500 COMPLX PO) Take 1,500 mg  by mouth daily.     Yes Historical Provider, MD  glucose blood test strip Use as instructed 09/15/13  Yes Ryan Lyn Hollingshead, PA-C  JANUMET 50-1000 MG tablet Take 1 tablet by mouth two  times daily with meals 07/26/16  Yes Wendie Agreste, MD  Multiple Vitamin (MULTIVITAMIN) tablet Take 1 tablet by mouth daily.   Yes Historical Provider, MD  pramipexole (MIRAPEX) 0.75 MG tablet Take 1 tablet (0.75 mg total) by mouth 3 (three) times daily. 09/03/16  Yes [name], MD  rasagiline (AZILECT) 1 MG TABS tablet 1 pill once daily 09/03/16  Yes [name], MD  simvastatin (ZOCOR) 40 MG tablet Take one-half tablet by  mouth daily 07/26/16  Yes Wendie Agreste, MD  albuterol (PROVENTIL HFA;VENTOLIN HFA) 108 (90 Base) MCG/ACT inhaler Inhale 2 puffs into the lungs every 4 (four) hours as needed for wheezing or shortness of breath (cough, shortness of breath or wheezing.). Patient not taking: Reported on 01/10/2017 12/06/16   Sedalia Muta, FNP  aspirin 81 MG tablet Take 81 mg by mouth daily.      Historical Provider, MD  glipiZIDE (GLUCOTROL XL) 5 MG 24 hr tablet TAKE 1 TABLET BY MOUTH DAILY 02/21/16   Wardell Honour, MD     Allergies  Allergen Reactions  . Codeine Nausea And Vomiting       Objective:  Physical Exam  Constitutional: He is oriented to person, place, and time. He appears well-developed and well-nourished. He is active and cooperative. No distress.  BP 108/64 (Cuff Size: Large)   Pulse 78   Temp 98 F (36.7  C) (Oral)   Resp 16   Wt 227 lb 6.4 oz (103.1 kg)   SpO2 96%   BMI 33.83 kg/m   HENT:  Head: Normocephalic and atraumatic.  Right Ear: Hearing normal.  Left Ear: Hearing normal.  Eyes: Conjunctivae are normal. No scleral icterus.  Neck: Normal range of motion. Neck supple. No thyromegaly present.  Cardiovascular: Normal rate, regular rhythm and normal heart sounds.   Pulses:      Radial pulses are 2+ on the right side, and 2+ on the left side.  Pulmonary/Chest: Effort  normal and breath sounds normal.  Lymphadenopathy:       Head (right side): No tonsillar, no preauricular, no posterior auricular and no occipital adenopathy present.       Head (left side): No tonsillar, no preauricular, no posterior auricular and no occipital adenopathy present.    He has no cervical adenopathy.       Right: No supraclavicular adenopathy present.       Left: No supraclavicular adenopathy present.  Neurological: He is alert and oriented to person, place, and time. No sensory deficit.  Skin: Skin is warm, dry and intact. No rash noted. No cyanosis or erythema. Nails show no clubbing.  Psychiatric: He has a normal mood and affect. His speech is normal and behavior is normal.           Assessment & Plan:   1. Type 2 diabetes mellitus without complication, without long-term current use of insulin ([place]) Await labs. Adjust regimen as indicated by results. - Hemoglobin A1c - Comprehensive metabolic panel  2. Essential hypertension, benign Controlled.  - CBC with Differential/Platelet - Comprehensive metabolic panel  3. Mixed hyperlipidemia Await labs. Adjust regimen as indicated by results. - Lipid panel - Comprehensive metabolic panel  4. Obesity (BMI 30-39.9) Increased weight with reduced mobility. Encouraged continued exercise.  5. Parkinson disease ([place]) Continue follow-up with neurology.  6. Need for pneumococcal vaccination Needs pneumovax 23 in 12 months to complete series. - Pneumococcal conjugate vaccine 13-valent IM - Care order/instruction:   [name], PA-C Physician Assistant-Certified [facility] [place]

## 2017-01-10 NOTE — Patient Instructions (Signed)
     IF you received an x-ray today, you will receive an invoice from [facility]. Please contact [facility] at [phone] with questions or concerns regarding your invoice.   IF you received labwork today, you will receive an invoice from [facility]. Please contact [facility] at [phone] with questions or concerns regarding your invoice.   Our billing staff will not be able to assist you with questions regarding bills from these companies.  You will be contacted with the lab results as soon as they are available. The fastest way to get your results is to activate your My Chart account. Instructions are located on the last page of this paperwork. If you have not heard from us regarding the results in 2 weeks, please contact this office.     

## 2017-01-10 NOTE — Progress Notes (Signed)
Patient ID: Danny [name], male    DOB: 1951-09-22, 66 y.o.   MRN: [id number]  PCP: [name], PA-C  Chief Complaint  Patient presents with  . Diabetes    3 MONTH CHECK    Subjective:   Presents for follow up of type 2 diabetes mellitus.  Pt is a 66yo caucasian male who presents for regular 3 month follow up of type 2 diabetes mellitus. He states that he is taking his medications as directed. He does not take measure his blood glucose at home. Pt states that he's been doing well. Admits to gradual worsening vision over the last few years. Pt states that he intends on making an eye exam appointment soon. Denies fever, chills, fatigue, weight loss, cough, SOB, chest pain, palpitations, abdominal pain, nausea, vomiting, diarrhea, constipation, neuropathy, polyuria, or polydipsia.   Of note, pt states that he is having worsening Parkinsonian symptoms that include increased difficulty walking and drooling. Pt no longer drives. Pt is being followed by neurology for this condition.    Review of Systems See HPI    Patient Active Problem List   Diagnosis Date Noted  . Hematospermia 11/18/2015  . Epididymal cyst 05/25/2015  . Parkinson disease ([place]) 01/14/2014  . Obesity (BMI 30-39.9) 07/14/2013  . Confusional arousals 02/13/2013  . Disorder of ligament of right wrist 01/29/2012  . Diabetes mellitus type 2, uncomplicated ([place]) 99991111  . Mixed hyperlipidemia 10/09/2011  . OA (osteoarthritis) of knee 10/09/2011  . Essential hypertension, benign 10/09/2011     Prior to Admission medications   Medication Sig Start Date End Date Taking? Authorizing Provider  benzonatate (TESSALON) 100 MG capsule Take 1-2 capsules (100-200 mg total) by mouth 3 (three) times daily as needed for cough. 12/06/16  Yes Sedalia Muta, FNP  Cinnamon 500 MG capsule Take 500 mg by mouth daily.   Yes Historical Provider, MD  enalapril (VASOTEC) 2.5 MG tablet Take 1 tablet by mouth   daily 05/08/16  Yes Wardell Honour, MD  Ginger, Zingiber officinalis, 550 MG CAPS Take 550 mg by mouth daily.     Yes Historical Provider, MD  Glucosamine-Chondroit-Vit C-Mn (GLUCOSAMINE CHONDR 1500 COMPLX PO) Take 1,500 mg by mouth daily.     Yes Historical Provider, MD  glucose blood test strip Use as instructed 09/15/13  Yes Ryan Lyn Hollingshead, PA-C  JANUMET 50-1000 MG tablet Take 1 tablet by mouth two  times daily with meals 07/26/16  Yes Wendie Agreste, MD  Multiple Vitamin (MULTIVITAMIN) tablet Take 1 tablet by mouth daily.   Yes Historical Provider, MD  pramipexole (MIRAPEX) 0.75 MG tablet Take 1 tablet (0.75 mg total) by mouth 3 (three) times daily. 09/03/16  Yes [name], MD  rasagiline (AZILECT) 1 MG TABS tablet 1 pill once daily 09/03/16  Yes [name], MD  simvastatin (ZOCOR) 40 MG tablet Take one-half tablet by  mouth daily 07/26/16  Yes Wendie Agreste, MD  albuterol (PROVENTIL HFA;VENTOLIN HFA) 108 (90 Base) MCG/ACT inhaler Inhale 2 puffs into the lungs every 4 (four) hours as needed for wheezing or shortness of breath (cough, shortness of breath or wheezing.). Patient not taking: Reported on 01/10/2017 12/06/16   Sedalia Muta, FNP  aspirin 81 MG tablet Take 81 mg by mouth daily.      Historical Provider, MD  glipiZIDE (GLUCOTROL XL) 5 MG 24 hr tablet TAKE 1 TABLET BY MOUTH DAILY 02/21/16   Wardell Honour, MD     Allergies  Allergen Reactions  . Codeine Nausea And Vomiting       Objective:  Physical Exam HEENT: PERRLA Pulm: Good respiratory effort. CTAB. No wheezes, rales, or rhonchi. CV: RRR. No M/R/G. Abd: Soft, nontender. Neuro: A&O x 3.  Psych. Good affect.      Assessment & Plan:   1. Type 2 diabetes mellitus without complication, without long-term current use of insulin ([facility]) Pt advised to continue medications as directed, pending A1C and CMP results, and follow up in 3 months.  - Hemoglobin A1c - Comprehensive metabolic panel  2. Essential  hypertension, benign - CBC with Differential/Platelet - Comprehensive metabolic panel  3. Mixed hyperlipidemia - Lipid panel - Comprehensive metabolic panel  4. Obesity (BMI 30-39.9)  5. Parkinson disease ([place]) Pt is being followed by neurology.  6. Need for pneumococcal vaccination - Pneumococcal conjugate vaccine 13-valent IM - Care order/instruction:   Lorella Nimrod, PA-S

## 2017-01-11 LAB — LIPID PANEL
Chol/HDL Ratio: 2.4 ratio units (ref 0.0–5.0)
Cholesterol, Total: 131 mg/dL (ref 100–199)
HDL: 55 mg/dL (ref >39)
LDL CALC: 58 mg/dL (ref 0–99)
Triglycerides: 91 mg/dL (ref 0–149)
VLDL CHOLESTEROL CAL: 18 mg/dL (ref 5–40)

## 2017-01-11 LAB — COMPREHENSIVE METABOLIC PANEL
ALK PHOS: 55 IU/L (ref 39–117)
ALT: 35 IU/L (ref 0–44)
AST: 26 IU/L (ref 0–40)
Albumin/Globulin Ratio: 1.8 (ref 1.2–2.2)
Albumin: 4.5 g/dL (ref 3.6–4.8)
BILIRUBIN TOTAL: 1 mg/dL (ref 0.0–1.2)
BUN / CREAT RATIO: 18 (ref 10–24)
BUN: 19 mg/dL (ref 8–27)
CHLORIDE: 99 mmol/L (ref 96–106)
CO2: 21 mmol/L (ref 18–29)
CREATININE: 1.04 mg/dL (ref 0.76–1.27)
Calcium: 10 mg/dL (ref 8.6–10.2)
GFR calc Af Amer: 87 mL/min/{1.73_m2} (ref >59)
GFR calc non Af Amer: 75 mL/min/{1.73_m2} (ref >59)
GLOBULIN, TOTAL: 2.5 g/dL (ref 1.5–4.5)
GLUCOSE: 126 mg/dL — AB (ref 65–99)
Potassium: 5.2 mmol/L (ref 3.5–5.2)
SODIUM: 137 mmol/L (ref 134–144)
Total Protein: 7 g/dL (ref 6.0–8.5)

## 2017-01-11 LAB — CBC WITH DIFFERENTIAL/PLATELET
Basophils Absolute: 0 10*3/uL (ref 0.0–0.2)
Basos: 0 %
EOS (ABSOLUTE): 0.2 10*3/uL (ref 0.0–0.4)
Eos: 2 %
Hematocrit: 41.2 % (ref 37.5–51.0)
Hemoglobin: 13.9 g/dL (ref 13.0–17.7)
Immature Grans (Abs): 0 10*3/uL (ref 0.0–0.1)
Immature Granulocytes: 0 %
LYMPHS ABS: 1.8 10*3/uL (ref 0.7–3.1)
Lymphs: 20 %
MCH: 30.5 pg (ref 26.6–33.0)
MCHC: 33.7 g/dL (ref 31.5–35.7)
MCV: 91 fL (ref 79–97)
Monocytes Absolute: 0.6 10*3/uL (ref 0.1–0.9)
Monocytes: 6 %
NEUTROS ABS: 6.4 10*3/uL (ref 1.4–7.0)
Neutrophils: 72 %
PLATELETS: 198 10*3/uL (ref 150–379)
RBC: 4.55 x10E6/uL (ref 4.14–5.80)
RDW: 14.4 % (ref 12.3–15.4)
WBC: 9 10*3/uL (ref 3.4–10.8)

## 2017-01-11 LAB — HEMOGLOBIN A1C
Est. average glucose Bld gHb Est-mCnc: 163 mg/dL
Hgb A1c MFr Bld: 7.3 % — ABNORMAL HIGH (ref 4.8–5.6)

## 2017-01-11 MED ORDER — DAPAGLIFLOZIN PROPANEDIOL 5 MG PO TABS: 5.0000 mg | ORAL_TABLET | Freq: Every day | ORAL | 3 refills | Status: DC

## 2017-01-15 ENCOUNTER — Telehealth: Payer: Self-pay

## 2017-01-15 NOTE — Telephone Encounter (Signed)
Patient is calling in reference to his Farxiga 5mg  tablet prescription that was prescribed by Chelle.  He states that with insurance this medication is over $300.  Please advise  [phone]

## 2017-01-15 NOTE — Telephone Encounter (Signed)
Patient is calling in reference to his Farxiga 5mg  tablet prescription that was prescribed by Chelle.  He states that with insurance this medication is over $300.  Please advise  [phone]

## 2017-01-15 NOTE — Telephone Encounter (Signed)
See MyChart message

## 2017-02-04 ENCOUNTER — Telehealth: Payer: Self-pay

## 2017-02-04 DIAGNOSIS — G4752 REM sleep behavior disorder: Secondary | ICD-10-CM

## 2017-02-04 DIAGNOSIS — E119 Type 2 diabetes mellitus without complications: Secondary | ICD-10-CM

## 2017-02-04 DIAGNOSIS — G20 Parkinson's disease: Secondary | ICD-10-CM

## 2017-02-04 MED ORDER — GLIPIZIDE ER 5 MG PO TB24: ORAL_TABLET | ORAL | 1 refills | Status: DC

## 2017-02-04 MED ORDER — SITAGLIPTIN PHOS-METFORMIN HCL 50-1000 MG PO TABS: ORAL_TABLET | ORAL | 1 refills | Status: DC

## 2017-02-04 MED ORDER — GLUCOSE BLOOD VI STRP: ORAL_STRIP | 4 refills | Status: DC

## 2017-02-04 MED ORDER — RASAGILINE MESYLATE 1 MG PO TABS: ORAL_TABLET | ORAL | 1 refills | Status: DC

## 2017-02-04 MED ORDER — PRAMIPEXOLE DIHYDROCHLORIDE 0.75 MG PO TABS: 0.7500 mg | ORAL_TABLET | Freq: Three times a day (TID) | ORAL | 1 refills | Status: DC

## 2017-02-04 MED ORDER — SIMVASTATIN 40 MG PO TABS: 20.0000 mg | ORAL_TABLET | Freq: Every day | ORAL | 1 refills | Status: DC

## 2017-02-04 NOTE — Telephone Encounter (Signed)
PATIENT WOULD LIKE CHELLE TO KNOW THAT SINCE HE HAS RETIRED HE NO LONGER USES [facility]. HE NEEDS ALL OF HIS MEDICINES CALLED TO A LOCAL PHARMACY NOW. HE NEEDS: 1. SIMVASTATIN 40 MG   2. JANUMET 50-1000 MG   3. GLIPIZIDE XL 5 MG - 24 HOUR TABLET   4. PRAMIPEXOLE 0.75 MG   5. RASAGILINE 1 MG BEST PHONE [phone] (CELL) PHARMACY CHOICE IS [facility] ON [address]. [place]

## 2017-03-04 ENCOUNTER — Encounter: Payer: Self-pay | Admitting: Neurology

## 2017-03-04 ENCOUNTER — Ambulatory Visit (INDEPENDENT_AMBULATORY_CARE_PROVIDER_SITE_OTHER): Payer: Medicare Other | Admitting: Neurology

## 2017-03-04 DIAGNOSIS — G4752 REM sleep behavior disorder: Secondary | ICD-10-CM | POA: Diagnosis not present

## 2017-03-04 DIAGNOSIS — G20 Parkinson's disease: Secondary | ICD-10-CM | POA: Diagnosis not present

## 2017-03-04 MED ORDER — PRAMIPEXOLE DIHYDROCHLORIDE 1 MG PO TABS: 1.0000 mg | ORAL_TABLET | Freq: Three times a day (TID) | ORAL | 3 refills | Status: DC

## 2017-03-04 NOTE — Progress Notes (Signed)
Subjective:    Patient ID: Danny [name] is a 66 y.o. male.  HPI     Interim history:   Danny [name] is a very pleasant 66 year old right-handed gentleman with an underlying medical history of type 2 diabetes, osteoarthritis, hypertension, obesity, hyperlipidemia, and cataracts, who presents for follow-up consultation of his Parkinson's disease, with history of RBD and on treatment with Mirapex low-dose. The patient is accompanied by his wife today. I last saw Danny [name] on 09/03/2016, at which time Danny [name] reported doing well, no telltale differences after increasing the Mirapex. Danny [name] gave up driving. Danny [name] decided to retire by the end of December 2017. His mother-in-law was supposed to move in with them. His memory was stable, sleep was stable, no real mood issues, no recent falls. I suggested we keep his medications the same including Azilect 1 mg once daily, Mirapex 0.75 mg 3 times a day.  Today, 03/04/2017 (all dictated new, as well as above notes, some dictation done in note pad or Word, outside of chart, may appear as copied):   Danny [name] reports doing okay. Feels stable, retired in 12/17. Daughter lives with them with her family. Mother in law moved in with them. Wife retired in November, patient retired in December. Latest A1c in Jan. 7.3. Prior to that was 7.1. Danny [name] goes to the gym 2-3/week. Mild forgetfulness, nothing sinister. No mood issues, sleeps well, no SEs from the pramipexole. Planning a trip to [place]. No recent falls. Azilect too expensive. Some dream enactments, nothing major. Had Sx with PD dating back to early 2014, had sleep study in 1/14: AHI 4/hour, PLM index was 46/hour. Denies RLS symptoms.   The patient's allergies, current medications, family history, past medical history, past social history, past surgical history and problem list were reviewed and updated as appropriate.   Previously (copied from previous notes for reference):   I saw Danny [name] on 02/27/2016, at which time Danny [name] reported more  difficulty with his walking, Danny [name] had become slower. Wife had noted that it would take Danny [name] longer to do things. Danny [name] was dragging his feet at times. Danny [name] was not swinging his arms as well. 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 His A1c in December 2016 was a little up at 7.6. Memory was stable, with some mild forgetfulness. Mood was stable, RBD was infrequent. Danny [name] reported no recent falls. I suggested Danny [name] continue with Azilect and we increased his Mirapex to 0.75 mg 3 times a day.    I saw Danny [name] on 08/29/2015 at which time Danny [name] reported doing fairly well. Danny [name] was able to tolerate Azilect. His Mirapex was 0.5 mg 3 times a day. Danny [name] was working at the computer most of the time. Danny [name] was not always drinking enough water. Danny [name] had a good appetite. Danny [name] felt his memory was stable. RBD was less frequent but still occurring. His wife was able to sleep in the same bed with Danny [name]. Danny [name] had no recent falls. Danny [name] had some recent blurry vision was going to see his ophthalmologist. We mutually agreed to continue his current medication regimen.   I saw Danny [name] on 04/25/2015, at which time Danny [name] reported doing fairly well overall, but had noted smaller changes, including more slowness, difficulty with climbing ladders. Danny [name] was advised by his boss to not climb ladders or walk over obstacles at work. Thankfully, his work environment is understanding. Danny [name] had another business trip coming up and a colleague was to drive and do the climbing on the sites. Danny [name]  reported mild forgetfulness and some problems with depth perception. His diplopia had improved since Danny [name] was given prism glasses by his ophthalmologist. I suggested, we try Danny [name] on Azilect and keep his Mirapex the same.    I saw Danny [name] on 02/04/2015, at which time Danny [name] reported ongoing issues with diplopia, particularly at night while driving. Danny [name] was still working full-time. Danny [name] was still on low-dose Mirapex, 0.25 mg 3 times a day. Danny [name] had no side effects. Danny [name] had had a sleep study  which per his report did not show any OSA. I asked Danny [name] to make an appointment with his eye doctor. I suggested an increase in Mirapex to 0.5 mg 3 times a day.   I first met Danny [name] on 12/28/2014, at which time Danny [name] reported that Danny [name] was having more fatigue. However, Danny [name] also had reduce his caffeine intake. Danny [name] had no recent falls and mood stable. Danny [name] was working full-time. Danny [name] reported that Danny [name] had intermittent double vision in the last 3 months. His PCP requested a sooner than scheduled appointment for diplopia. His exam was stable at the time and I suggested no new medication changes.   Danny [name] has had some short-term memory issues. Danny [name] works full-time. 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 Danny [name] likes square dance but his wife has noted that Danny [name] has had more problems with his nighttime driving and his squared hands. In the recent 3 months Danny [name] has had some intermittent double vision at night. Danny [name] has drooling at night. Danny [name] rarely drinks alcohol and usually drinks 4-5 cups of coffee per day, occasional sodas. Danny [name] quit smoking in 1978. Blood pressure is low for Danny [name] today. It was recently noted to be trending lower and his blood pressure medication was reduced. Danny [name] takes his cholesterol medication every other day.   Danny [name] had a brain MRI without contrast on 11/04/2013: Normal MRI scan of the brain. Incidental findings of a chronic paranasal sinusitis with deviated nasal septum to the left.  Danny [name] previously followed with Dr. Jim Like and was last seen by Danny [name] on 08/30/2014, at which time his Mirapex was kept at 0.25 mg 3 times a day. Danny [name] reported no side effects, in particular no impulse control disorder. I reviewed Dr. Hazle Quant notes. Danny [name] first met Dr. Janann Colonel on 10/26/13, at which time the patient reported a right hand tremor noticed over the previous 4-6 months. Danny [name] had changes in his handwriting, slowness, some stiffness and changes in his walking.  His wife reported a longer standing history of REM behavior disorder. Danny [name] has been in outpatient physical  therapy.  His Past Medical History Is Significant For: Past Medical History:  Diagnosis Date  . Cataract   . Essential hypertension, benign   . Mixed hyperlipidemia   . OA (osteoarthritis) of knee    right  . Obesity, unspecified   . Parkinsons ([place])   . Type II or unspecified type diabetes mellitus without mention of complication, not stated as uncontrolled     His Past Surgical History Is Significant For: Past Surgical History:  Procedure Laterality Date  . GANGLION CYST EXCISION  age 84 years   right  . KNEE ARTHROSCOPY     right  . WRIST SURGERY  04/2012    His Family History Is Significant For: Family History  Problem Relation Age of Onset  . Diabetes Sister   . Diabetes Daughter   . Cancer Father   . Heart disease Father   . Diabetes Mother     His Social History Is Significant For:  Social History   Social History  . Marital status: Married    Spouse name: [name]  . Number of children: 3  . Years of education: 49   Occupational History  . Engineer    Social History Main Topics  . Smoking status: Former Smoker    Packs/day: 1.50    Years: 15.00    Types: Cigarettes    Quit date: 12/17/1982  . Smokeless tobacco: Never Used     Comment: over 40 yrs  . Alcohol use No     Comment: very rare  . Drug use: No  . Sexual activity: Yes    Partners: Female   Other Topics Concern  . None   Social History Narrative   Research officer, trade union, motorcycle rider.  Danny [name] lives with his wife [name]).  Danny [name] has 3 adult children.  His wife had 2 children, one of whom is deceased.   Patient is working full-time.   Patient has a Bachelor's degree.   Patient is right-handed.   Patient drinks 6 cups of coffee daily.    His Allergies Are:  Allergies  Allergen Reactions  . Codeine Nausea And Vomiting  :   His Current Medications Are:  Outpatient Encounter Prescriptions as of 03/04/2017  Medication Sig  . aspirin 81 MG tablet Take 81 mg by mouth daily.    . Cinnamon 500 MG  capsule Take 500 mg by mouth daily.  . dapagliflozin propanediol (FARXIGA) 5 MG TABS tablet Take 5 mg by mouth daily.  . enalapril (VASOTEC) 2.5 MG tablet Take 1 tablet by mouth  daily  . Ginger, Zingiber officinalis, 550 MG CAPS Take 550 mg by mouth daily.    Marland Kitchen glipiZIDE (GLUCOTROL XL) 5 MG 24 hr tablet TAKE 1 TABLET BY MOUTH DAILY  . Glucosamine-Chondroit-Vit C-Mn (GLUCOSAMINE CHONDR 1500 COMPLX PO) Take 1,500 mg by mouth daily.    Marland Kitchen glucose blood test strip Use as instructed  . Multiple Vitamin (MULTIVITAMIN) tablet Take 1 tablet by mouth daily.  . pramipexole (MIRAPEX) 0.75 MG tablet Take 1 tablet (0.75 mg total) by mouth 3 (three) times daily.  . rasagiline (AZILECT) 1 MG TABS tablet 1 pill once daily  . simvastatin (ZOCOR) 40 MG tablet Take 0.5 tablets (20 mg total) by mouth daily.  . sitaGLIPtin-metformin (JANUMET) 50-1000 MG tablet Take 1 tablet by mouth two  times daily with meals  . [DISCONTINUED] albuterol (PROVENTIL HFA;VENTOLIN HFA) 108 (90 Base) MCG/ACT inhaler Inhale 2 puffs into the lungs every 4 (four) hours as needed for wheezing or shortness of breath (cough, shortness of breath or wheezing.). (Patient not taking: Reported on 01/10/2017)  . [DISCONTINUED] benzonatate (TESSALON) 100 MG capsule Take 1-2 capsules (100-200 mg total) by mouth 3 (three) times daily as needed for cough.   No facility-administered encounter medications on file as of 03/04/2017.   :  Review of Systems:  Out of a complete 14 point review of systems, all are reviewed and negative with the exception of these symptoms as listed below: Review of Systems  Neurological:       Patient states that Danny [name] is doing well. Danny [name] has retired since we last saw Danny [name]. Danny [name] has started going to the gym 2-3 times a week. Needs refill of Azilect and Mirapex.     Objective:  Neurologic Exam  Physical Exam Physical Examination:   Vitals:   03/04/17 1350  BP: (!) 112/58  Pulse: 92  Resp: 16    General Examination: The  patient is a very pleasant  66 y.o. male in no acute distress. Danny [name] appears well-developed and well-nourished and well groomed. Good spirits.   HEENT: Normocephalic, atraumatic, pupils are equal, round and reactive to light and accommodation. Extraocular trackingShows mild saccadic breakdown, no nystagmus, no significant limitation to downgaze, slight limitation to upper gaze. Danny [name] has mild decrease in eye blink rate, status post bilateral cataract repairs, hearing is grossly intact, bilateral hearing aids in place. Face is mildly masked, neck is mild to moderately rigid, airway examination reveals mild mouth dryness, no significant airway crowding, Mallampati class II, no sialorrhea. Tongue protrudes centrally and palate elevates symmetrically. Mild hypophonia, no dysarthria noted.  Chest: Clear to auscultation without wheezing, rhonchi or crackles noted.  Heart: S1+S2+0, regular and normal without murmurs, rubs or gallops noted.   Abdomen: Soft, non-tender and non-distended with normal bowel sounds appreciated on auscultation.  Extremities: There is no pitting edema in the distal lower extremities bilaterally. Pedal pulses are intact.  Skin: Warm and dry without trophic changes noted.  Musculoskeletal: exam reveals no obvious joint deformities, tenderness or joint swelling or erythema.   Neurologically:  Mental status: The patient is awake, alert and oriented in all 4 spheres. His immediate and remote memory, attention, language skills and fund of knowledge are appropriate. There is no evidence of aphasia, agnosia, apraxia or anomia. Speech is clear with normal prosody and enunciation. Thought process is linear. Mood is normal and affect is normal.  Cranial nerves II - XII are as described above under HEENT exam. In addition: shoulder shrug is normal with equal shoulder height noted. Motor exam: Normal bulk, and strength is noted. Danny [name] has mild increase in tone on the right side. Overall mild  bradykinesia is noted, no consistent resting tremor, a slight intermittent resting tremor is noted in the right upper extremity. Danny [name] has no other resting tremor. Fine motor skills are mild to moderately impaired on the right and mildly impaired on the left. Sensory exam is intact to light touch. Cerebellar testing shows no dysmetria or intention tremor, no ataxia. Gait, station and balance: Danny [name] stands with mild difficulty, Danny [name] does not need assistance, Danny [name] does push himself up with his hands. Danny [name] has a mild lean to the left, mild to moderately stooped posture. Stance is naturally slightly wide-based. Danny [name] walks with decreased arm swing on the right. Balance is mildly impaired, but stable.  Assessment and plan:    In summary, HANNAN HUTMACHER is a very pleasant 66 y.o.-year old male with an underlying medical history of type 2 diabetes, osteoarthritis, hypertension, obesity, hyperlipidemia, cataract, status post surgeries, who presents for follow-up consultation of his right-sided predominant Parkinson's disease, Danny [name] has had evidence of mild RBD, no significant issues in that regard. Mood is stable, memory is stable. Danny [name] has been on low-dose Mirapex for some time. Danny [name] had a significant increase in co-pay when Danny [name] switched to Medicare and is no longer able to afford generic Azilect. We will phases out, Danny [name] is advised to use up his Azilect and when Danny [name] has 7 pills left Danny [name] can cut those in half and take half a pill once daily for 14 days then stop. Danny [name] is advised to increase Mirapex to 1 mg 3 times a day. Danny [name] is advised to make sure Danny [name] is well hydrated and more active physically. Danny [name] is advised to try to go to the gym on most days of the week. Danny [name] gave up driving several months ago. His exam is otherwise stable. I suggested a 6 month follow-up, sooner  as needed. I answered all their questions today and adjusted his Mirapex prescription and gave Danny [name] written instructions. I spent 25 minutes in total face-to-face time with the  patient, more than 50% of which was spent in counseling and coordination of care, reviewing test results, reviewing medication and discussing or reviewing the diagnosis of PD, its prognosis and treatment options. Pertinent laboratory and imaging test results that were available during this visit with the patient were reviewed by me and considered in my medical decision making (see chart for details).

## 2017-03-04 NOTE — Patient Instructions (Addendum)
Due to cost, we will taper you off the Azilect (rasagiline): use the pills you have and when you have 7 pills left, cut them in half and take 1/2 pill daily for 2 weeks, then stop.   We will increase your pramipexole to 1 mg strength: take 1 pill three times a day.

## 2017-04-02 DIAGNOSIS — E119 Type 2 diabetes mellitus without complications: Secondary | ICD-10-CM | POA: Diagnosis not present

## 2017-04-09 ENCOUNTER — Encounter: Payer: Self-pay | Admitting: Physician Assistant

## 2017-04-09 ENCOUNTER — Ambulatory Visit (INDEPENDENT_AMBULATORY_CARE_PROVIDER_SITE_OTHER): Payer: Medicare Other | Admitting: Physician Assistant

## 2017-04-09 VITALS — BP 124/80 | HR 90 | Temp 98.4°F | Resp 16 | Ht 68.75 in | Wt 223.8 lb

## 2017-04-09 DIAGNOSIS — E669 Obesity, unspecified: Secondary | ICD-10-CM

## 2017-04-09 DIAGNOSIS — E782 Mixed hyperlipidemia: Secondary | ICD-10-CM | POA: Diagnosis not present

## 2017-04-09 DIAGNOSIS — G20A1 Parkinson's disease without dyskinesia, without mention of fluctuations: Secondary | ICD-10-CM

## 2017-04-09 DIAGNOSIS — I10 Essential (primary) hypertension: Secondary | ICD-10-CM

## 2017-04-09 DIAGNOSIS — G20 Parkinson's disease: Secondary | ICD-10-CM

## 2017-04-09 DIAGNOSIS — E119 Type 2 diabetes mellitus without complications: Secondary | ICD-10-CM | POA: Diagnosis not present

## 2017-04-09 NOTE — Assessment & Plan Note (Signed)
Controlled. Continue current treatment. 

## 2017-04-09 NOTE — Assessment & Plan Note (Signed)
Continue statin. Await lab results.

## 2017-04-09 NOTE — Patient Instructions (Signed)
     IF you received an x-ray today, you will receive an invoice from [facility]. Please contact [facility] at [phone] with questions or concerns regarding your invoice.   IF you received labwork today, you will receive an invoice from [facility]. Please contact [facility] at [phone] with questions or concerns regarding your invoice.   Our billing staff will not be able to assist you with questions regarding bills from these companies.  You will be contacted with the lab results as soon as they are available. The fastest way to get your results is to activate your My Chart account. Instructions are located on the last page of this paperwork. If you have not heard from us regarding the results in 2 weeks, please contact this office.     

## 2017-04-09 NOTE — Progress Notes (Signed)
Patient ID: Danny [name], male    DOB: 29-Dec-1950, 66 y.o.   MRN: [id number]  PCP: [name], PA-C  Chief Complaint  Patient presents with  . Follow-up    diabetes check    Subjective:   Presents for evaluation of diabetes. He is accompanied by his wife, as usual.  Unable to afford [place]. Instead, they've been going to the gym regularly, those in the last two weeks they have not gone due to recent health problems of his 30 year old mother-in-law, who lives with them. He sold his motorcycle, and they are using the money to go to [place]. That provides increased incentive for him to increase exercise tolerance/endurance and perform his own ADLs.  He is also working to reduce sweets and junk food. Home glucose readings are 100's-120's fasting.   Continues follow-up with Dr. Rexene Alberts regarding Parkinson's.    Review of Systems Constitutional: Negative for activity change, appetite change, fatigueand unexpected weight change.  HENT: Negative for congestion, dental problem, ear pain, hearing loss, mouth sores, postnasal drip, rhinorrhea, sneezing, sore throat, tinnitusand trouble swallowing.  Eyes: Negative for photophobia, pain, rednessand visual disturbance.  Respiratory: Negative for cough, chest tightnessand shortness of breath.  Cardiovascular: Negative for chest pain, palpitationsand leg swelling.  Gastrointestinal: Negative for abdominal pain, blood in stool, constipation, diarrhea, nauseaand vomiting.  Genitourinary: Negative for dysuria, frequency, hematuriaand urgency.  Musculoskeletal: Negative for arthralgias, gait problem, myalgiasand neck stiffness.  Skin: Negative for rash.  Neurological: Negative for dizziness, speech difficulty, weakness, light-headedness, numbnessand headaches.  Hematological: Negative for adenopathy.  Psychiatric/Behavioral: Negative for confusionand sleep disturbance. The patient is not nervous/anxious.     Patient  Active Problem List   Diagnosis Date Noted  . Hematospermia 11/18/2015  . Epididymal cyst 05/25/2015  . Parkinson disease ([place]) 01/14/2014  . Obesity (BMI 30-39.9) 07/14/2013  . Confusional arousals 02/13/2013  . Disorder of ligament of right wrist 01/29/2012  . Diabetes mellitus type 2, uncomplicated ([place]) 89/21/1941  . Mixed hyperlipidemia 10/09/2011  . OA (osteoarthritis) of knee 10/09/2011  . Essential hypertension, benign 10/09/2011     Prior to Admission medications   Medication Sig Start Date End Date Taking? Authorizing Provider  aspirin 81 MG tablet Take 81 mg by mouth daily.     Yes Historical Provider, MD  Cinnamon 500 MG capsule Take 500 mg by mouth daily.   Yes Historical Provider, MD  enalapril (VASOTEC) 2.5 MG tablet Take 1 tablet by mouth  daily 05/08/16  Yes Wardell Honour, MD  Ginger, Zingiber officinalis, 550 MG CAPS Take 550 mg by mouth daily.     Yes Historical Provider, MD  glipiZIDE (GLUCOTROL XL) 5 MG 24 hr tablet TAKE 1 TABLET BY MOUTH DAILY 02/04/17  Yes Sherea Liptak, PA-C  Glucosamine-Chondroit-Vit C-Mn (GLUCOSAMINE CHONDR 1500 COMPLX PO) Take 1,500 mg by mouth daily.     Yes Historical Provider, MD  glucose blood test strip Use as instructed 02/04/17  Yes Ebonye Reade, PA-C  Multiple Vitamin (MULTIVITAMIN) tablet Take 1 tablet by mouth daily.   Yes Historical Provider, MD  pramipexole (MIRAPEX) 1 MG tablet Take 1 tablet (1 mg total) by mouth 3 (three) times daily. 03/04/17  Yes [name], MD  simvastatin (ZOCOR) 40 MG tablet Take 0.5 tablets (20 mg total) by mouth daily. 02/04/17  Yes Kit Brubacher, PA-C  sitaGLIPtin-metformin (JANUMET) 50-1000 MG tablet Take 1 tablet by mouth two  times daily with meals 02/04/17  Yes Adine Heimann, PA-C  dapagliflozin propanediol (FARXIGA) 5  MG TABS tablet Take 5 mg by mouth daily. Patient not taking: Reported on 04/09/2017 01/11/17   [name], PA-C  rasagiline (AZILECT) 1 MG TABS tablet 1 pill once daily Patient not  taking: Reported on 04/09/2017 02/04/17   [name], PA-C     Allergies  Allergen Reactions  . Codeine Nausea And Vomiting       Objective:  Physical Exam  Constitutional: He is oriented to person, place, and time. He appears well-developed and well-nourished. He is active and cooperative. No distress.  BP 124/80 (BP Location: Right Arm, Patient Position: Sitting, Cuff Size: Small)   Pulse 90   Temp 98.4 F (36.9 C) (Oral)   Resp 16   Ht 5' 8.75" (1.746 m)   Wt 223 lb 12.8 oz (101.5 kg)   SpO2 95%   BMI 33.29 kg/m   HENT:  Head: Normocephalic and atraumatic.  Right Ear: Hearing normal.  Left Ear: Hearing normal.  Eyes: Conjunctivae are normal. No scleral icterus.  Neck: Normal range of motion. Neck supple. No thyromegaly present.  Cardiovascular: Normal rate, regular rhythm and normal heart sounds.   Pulses:      Radial pulses are 2+ on the right side, and 2+ on the left side.  Pulmonary/Chest: Effort normal and breath sounds normal.  Lymphadenopathy:       Head (right side): No tonsillar, no preauricular, no posterior auricular and no occipital adenopathy present.       Head (left side): No tonsillar, no preauricular, no posterior auricular and no occipital adenopathy present.    He has no cervical adenopathy.       Right: No supraclavicular adenopathy present.       Left: No supraclavicular adenopathy present.  Neurological: He is alert and oriented to person, place, and time. No sensory deficit.  Skin: Skin is warm, dry and intact. No rash noted. No cyanosis or erythema. Nails show no clubbing.  Psychiatric: He has a normal mood and affect. His speech is normal and behavior is normal.       Diabetic Foot Exam - Simple   Simple Foot Form Diabetic Foot exam was performed with the following findings:  Yes 04/09/2017  2:21 PM  Visual Inspection No deformities, no ulcerations, no other skin breakdown bilaterally:  Yes Sensation Testing Intact to touch and  monofilament testing bilaterally:  Yes Pulse Check Posterior Tibialis and Dorsalis pulse intact bilaterally:  Yes Comments        Assessment & Plan:   Problem List Items Addressed This Visit    Diabetes mellitus type 2, uncomplicated ([place]) - Primary (Chronic)    Unable to afford Wilder Glade, so has been more intentional with healthier eating choices and regular exercise. Await A1C.       Relevant Orders   Comprehensive metabolic panel   Hemoglobin A1c   HM DIABETES EYE EXAM (Completed)   HM DIABETES FOOT EXAM (Completed)   Mixed hyperlipidemia (Chronic)    Continue statin. Await lab results.      Relevant Orders   Lipid panel   Essential hypertension, benign (Chronic)    Controlled. Continue current treatment.      Relevant Orders   CBC with Differential/Platelet   Obesity (BMI 30-39.9)    Encouraged continued exercise and healthier eating choices.      Parkinson disease ([place])    Continue treatment and follow-up per Dr. Rexene Alberts.          Return in about 3 months (around 07/09/2017).   Judith Campillo S.  [facility]

## 2017-04-09 NOTE — Assessment & Plan Note (Signed)
Unable to afford Wilder Glade, so has been more intentional with healthier eating choices and regular exercise. Await A1C.

## 2017-04-09 NOTE — Assessment & Plan Note (Signed)
Continue treatment and follow-up per Dr. Rexene Alberts.

## 2017-04-09 NOTE — Assessment & Plan Note (Signed)
Encouraged continued exercise and healthier eating choices.

## 2017-04-09 NOTE — Progress Notes (Signed)
THIS NOTE IS USED FOR EDUCATIONAL PURPOSES ONLY!!!   Name: Danny [name]  DOB: 1951/02/15  Age: 66 y.o. Sex: male  CC:  Chief Complaint  Patient presents with  . Follow-up    diabetes check    PCP: [name], PA-C  HPI: Patient reports today for f/u of HTN, T2DM, and HLD.   Patient reports he is currently going to the gym 3x weekly. Reports he is walking more. Patient reports he doesn't sweets anymore and trying to cut out junk food.   Patient reports his sugars are running 100s to 120s in the morning. He reports he is currently taking all of his medications without issues. He has not been taking his [place] because even with his coupon it is running $300.   Patient was seen by Dr. Rexene Alberts (03/04/17) for PMH of Parkinsons. Was encouraged to become more active. Stable. F/u in 6 months.   Patient is due for his annual diabetic foot exam. Completed today.  Diabetic Foot Exam - Simple   Simple Foot Form Diabetic Foot exam was performed with the following findings:  Yes 04/09/2017  2:21 PM  Visual Inspection No deformities, no ulcerations, no other skin breakdown bilaterally:  Yes Sensation Testing Intact to touch and monofilament testing bilaterally:  Yes Pulse Check Posterior Tibialis and Dorsalis pulse intact bilaterally:  Yes Comments    Patient is due for his yearly eye exam. Reports he got that done last week by Dr. Augusto Gamble.   ROS:  Constitutional: Negative for activity change, appetite change, fatigue and unexpected weight change.  HENT: Negative for congestion, dental problem, ear pain, hearing loss, mouth sores, postnasal drip, rhinorrhea, sneezing, sore throat, tinnitus and trouble swallowing.   Eyes: Negative for photophobia, pain, redness and visual disturbance.  Respiratory: Negative for cough, chest tightness and shortness of breath.   Cardiovascular: Negative for chest pain, palpitations and leg swelling.  Gastrointestinal: Negative for abdominal pain, blood in  stool, constipation, diarrhea, nausea and vomiting.  Genitourinary: Negative for dysuria, frequency, hematuria and urgency.  Musculoskeletal: Negative for arthralgias, gait problem, myalgias and neck stiffness.  Skin: Negative for rash.  Neurological: Negative for dizziness, speech difficulty, weakness, light-headedness, numbness and headaches.  Hematological: Negative for adenopathy.  Psychiatric/Behavioral: Negative for confusion and sleep disturbance. The patient is not nervous/anxious.    PMH:  Patient Active Problem List   Diagnosis Date Noted  . Hematospermia 11/18/2015  . Epididymal cyst 05/25/2015  . Parkinson disease ([place]) 01/14/2014  . Obesity (BMI 30-39.9) 07/14/2013  . Confusional arousals 02/13/2013  . Disorder of ligament of right wrist 01/29/2012  . Diabetes mellitus type 2, uncomplicated ([place]) 10/10/8526  . Mixed hyperlipidemia 10/09/2011  . OA (osteoarthritis) of knee 10/09/2011  . Essential hypertension, benign 10/09/2011    Allergies:  Allergies  Allergen Reactions  . Codeine Nausea And Vomiting    Medications:  Current Outpatient Prescriptions on File Prior to Visit  Medication Sig Dispense Refill  . aspirin 81 MG tablet Take 81 mg by mouth daily.      . Cinnamon 500 MG capsule Take 500 mg by mouth daily.    . dapagliflozin propanediol (FARXIGA) 5 MG TABS tablet Take 5 mg by mouth daily. 90 tablet 3  . enalapril (VASOTEC) 2.5 MG tablet Take 1 tablet by mouth  daily 90 tablet 1  . Ginger, Zingiber officinalis, 550 MG CAPS Take 550 mg by mouth daily.      Marland Kitchen glipiZIDE (GLUCOTROL XL) 5 MG 24 hr tablet TAKE 1 TABLET  BY MOUTH DAILY 90 tablet 1  . Glucosamine-Chondroit-Vit C-Mn (GLUCOSAMINE CHONDR 1500 COMPLX PO) Take 1,500 mg by mouth daily.      Marland Kitchen glucose blood test strip Use as instructed 300 each 4  . Multiple Vitamin (MULTIVITAMIN) tablet Take 1 tablet by mouth daily.    . pramipexole (MIRAPEX) 1 MG tablet Take 1 tablet (1 mg total) by mouth 3 (three) times  daily. 270 tablet 3  . rasagiline (AZILECT) 1 MG TABS tablet 1 pill once daily 90 tablet 1  . simvastatin (ZOCOR) 40 MG tablet Take 0.5 tablets (20 mg total) by mouth daily. 45 tablet 1  . sitaGLIPtin-metformin (JANUMET) 50-1000 MG tablet Take 1 tablet by mouth two  times daily with meals 180 tablet 1   No current facility-administered medications on file prior to visit.     PE:  GS: WDWN male sitting on exam table in NAD.  Vitals: BP 124/80 (BP Location: Right Arm, Patient Position: Sitting, Cuff Size: Small)   Pulse 90   Temp 98.4 F (36.9 C) (Oral)   Resp 16   Ht 5' 8.75" (1.746 m)   Wt 223 lb 12.8 oz (101.5 kg)   SpO2 95%   BMI 33.29 kg/m  HEENT: Normocephalic, atruamatic. PEARRL. No cervical lymphadenopathy. No thyroid nodules, normal size, and equal bilaterally.  Cardiovascular: RRR. No S3 or S4. No murmurs, rubs, or gallops. Pulses 2+ and equal bilateral in the upper and lower extremities. No pitting edema. No varicosities, clubbing, or cyanosis.  Pulm: CTA bilaterally. No expiratory muscle use while breathing.  GI: +BS. NTND. No rigidity or guarding. No rebound tenderness.  Neuro: CN 2-12 grossly intact.  Psych: A&O x 4. Mood and affect appropriate for situation.  Skin: Warm and dry. No rashes or excoriations on exposed skin.   A&P:  1. Type 2 diabetes mellitus without complication, without long-term current use of insulin (HCC) - controlled on current medications. Reevaluate today.   Plan: Comprehensive metabolic panel, Hemoglobin A1c, HM DIABETES EYE EXAM, HM DIABETES FOOT EXAM  2. Essential hypertension, benign - controlled on current medications. Reevaluate today. Plan: CBC with Differential/Platelet  3. Mixed hyperlipidemia - controlled on current medications. Reevaluate today.  Plan: Lipid panel  4. Obesity (BMI 30-39.9)- reports going to gym more.   5. Parkinson disease ([place])- followed by neurology.    Respectfully,  Delilah Shan, PA-S2

## 2017-04-10 LAB — LIPID PANEL
CHOL/HDL RATIO: 2.3 ratio (ref 0.0–5.0)
Cholesterol, Total: 118 mg/dL (ref 100–199)
HDL: 51 mg/dL (ref >39)
LDL CALC: 50 mg/dL (ref 0–99)
Triglycerides: 83 mg/dL (ref 0–149)
VLDL CHOLESTEROL CAL: 17 mg/dL (ref 5–40)

## 2017-04-10 LAB — COMPREHENSIVE METABOLIC PANEL
ALT: 32 IU/L (ref 0–44)
AST: 18 IU/L (ref 0–40)
Albumin/Globulin Ratio: 1.7 (ref 1.2–2.2)
Albumin: 4.1 g/dL (ref 3.6–4.8)
Alkaline Phosphatase: 55 IU/L (ref 39–117)
BILIRUBIN TOTAL: 0.6 mg/dL (ref 0.0–1.2)
BUN / CREAT RATIO: 17 (ref 10–24)
BUN: 18 mg/dL (ref 8–27)
CO2: 23 mmol/L (ref 18–29)
CREATININE: 1.05 mg/dL (ref 0.76–1.27)
Calcium: 10.1 mg/dL (ref 8.6–10.2)
Chloride: 100 mmol/L (ref 96–106)
GFR calc non Af Amer: 74 mL/min/{1.73_m2} (ref >59)
GFR, EST AFRICAN AMERICAN: 86 mL/min/{1.73_m2} (ref >59)
GLUCOSE: 123 mg/dL — AB (ref 65–99)
Globulin, Total: 2.4 g/dL (ref 1.5–4.5)
Potassium: 4.7 mmol/L (ref 3.5–5.2)
Sodium: 140 mmol/L (ref 134–144)
TOTAL PROTEIN: 6.5 g/dL (ref 6.0–8.5)

## 2017-04-10 LAB — CBC WITH DIFFERENTIAL/PLATELET
BASOS ABS: 0 10*3/uL (ref 0.0–0.2)
Basos: 0 %
EOS (ABSOLUTE): 0.2 10*3/uL (ref 0.0–0.4)
Eos: 2 %
Hematocrit: 42.1 % (ref 37.5–51.0)
Hemoglobin: 14.4 g/dL (ref 13.0–17.7)
IMMATURE GRANS (ABS): 0 10*3/uL (ref 0.0–0.1)
Immature Granulocytes: 0 %
LYMPHS ABS: 1.9 10*3/uL (ref 0.7–3.1)
Lymphs: 21 %
MCH: 31 pg (ref 26.6–33.0)
MCHC: 34.2 g/dL (ref 31.5–35.7)
MCV: 91 fL (ref 79–97)
MONOCYTES: 6 %
MONOS ABS: 0.5 10*3/uL (ref 0.1–0.9)
NEUTROS ABS: 6.3 10*3/uL (ref 1.4–7.0)
Neutrophils: 71 %
Platelets: 196 10*3/uL (ref 150–379)
RBC: 4.64 x10E6/uL (ref 4.14–5.80)
RDW: 14.5 % (ref 12.3–15.4)
WBC: 8.9 10*3/uL (ref 3.4–10.8)

## 2017-04-10 LAB — HEMOGLOBIN A1C
ESTIMATED AVERAGE GLUCOSE: 163 mg/dL
HEMOGLOBIN A1C: 7.3 % — AB (ref 4.8–5.6)

## 2017-04-11 ENCOUNTER — Encounter: Payer: Self-pay | Admitting: Physician Assistant

## 2017-04-11 DIAGNOSIS — E119 Type 2 diabetes mellitus without complications: Secondary | ICD-10-CM

## 2017-04-13 MED ORDER — GLIPIZIDE ER 10 MG PO TB24: ORAL_TABLET | ORAL | 3 refills | Status: DC

## 2017-04-29 ENCOUNTER — Other Ambulatory Visit: Payer: Self-pay | Admitting: Neurology

## 2017-04-29 DIAGNOSIS — G4752 REM sleep behavior disorder: Secondary | ICD-10-CM

## 2017-04-29 DIAGNOSIS — G20 Parkinson's disease: Secondary | ICD-10-CM

## 2017-05-01 NOTE — Telephone Encounter (Signed)
Refill request received by pharmacy.

## 2017-05-07 ENCOUNTER — Encounter: Payer: Self-pay | Admitting: Family Medicine

## 2017-05-07 MED ORDER — SITAGLIPTIN PHOS-METFORMIN HCL 50-1000 MG PO TABS: ORAL_TABLET | ORAL | 1 refills | Status: DC

## 2017-05-10 ENCOUNTER — Encounter: Payer: Self-pay | Admitting: Physician Assistant

## 2017-05-10 DIAGNOSIS — E119 Type 2 diabetes mellitus without complications: Secondary | ICD-10-CM

## 2017-05-12 MED ORDER — GLIPIZIDE ER 10 MG PO TB24: ORAL_TABLET | ORAL | 3 refills | Status: DC

## 2017-07-16 ENCOUNTER — Encounter: Payer: Self-pay | Admitting: Physician Assistant

## 2017-07-16 ENCOUNTER — Ambulatory Visit (INDEPENDENT_AMBULATORY_CARE_PROVIDER_SITE_OTHER): Payer: Medicare Other | Admitting: Physician Assistant

## 2017-07-16 VITALS — BP 91/63 | HR 90 | Temp 98.2°F | Resp 18 | Ht 68.75 in | Wt 223.8 lb

## 2017-07-16 DIAGNOSIS — I10 Essential (primary) hypertension: Secondary | ICD-10-CM

## 2017-07-16 DIAGNOSIS — G20 Parkinson's disease: Secondary | ICD-10-CM | POA: Diagnosis not present

## 2017-07-16 DIAGNOSIS — G20A1 Parkinson's disease without dyskinesia, without mention of fluctuations: Secondary | ICD-10-CM

## 2017-07-16 DIAGNOSIS — R12 Heartburn: Secondary | ICD-10-CM | POA: Diagnosis not present

## 2017-07-16 DIAGNOSIS — E119 Type 2 diabetes mellitus without complications: Secondary | ICD-10-CM | POA: Diagnosis not present

## 2017-07-16 DIAGNOSIS — E782 Mixed hyperlipidemia: Secondary | ICD-10-CM | POA: Diagnosis not present

## 2017-07-16 LAB — COMPREHENSIVE METABOLIC PANEL
ALBUMIN: 4.1 g/dL (ref 3.6–4.8)
ALK PHOS: 52 IU/L (ref 39–117)
ALT: 36 IU/L (ref 0–44)
AST: 20 IU/L (ref 0–40)
Albumin/Globulin Ratio: 1.7 (ref 1.2–2.2)
BUN/Creatinine Ratio: 14 (ref 10–24)
BUN: 17 mg/dL (ref 8–27)
Bilirubin Total: 0.8 mg/dL (ref 0.0–1.2)
CO2: 19 mmol/L — AB (ref 20–29)
Calcium: 9.9 mg/dL (ref 8.6–10.2)
Chloride: 103 mmol/L (ref 96–106)
Creatinine, Ser: 1.22 mg/dL (ref 0.76–1.27)
GFR calc Af Amer: 71 mL/min/{1.73_m2} (ref >59)
GFR, EST NON AFRICAN AMERICAN: 62 mL/min/{1.73_m2} (ref >59)
Globulin, Total: 2.4 g/dL (ref 1.5–4.5)
Glucose: 86 mg/dL (ref 65–99)
Potassium: 4.7 mmol/L (ref 3.5–5.2)
Sodium: 139 mmol/L (ref 134–144)
Total Protein: 6.5 g/dL (ref 6.0–8.5)

## 2017-07-16 LAB — POCT URINALYSIS DIP (MANUAL ENTRY)
GLUCOSE UA: NEGATIVE mg/dL
Leukocytes, UA: NEGATIVE
Nitrite, UA: NEGATIVE
PH UA: 5 (ref 5.0–8.0)
RBC UA: NEGATIVE
Urobilinogen, UA: 0.2 E.U./dL

## 2017-07-16 LAB — CBC WITH DIFFERENTIAL/PLATELET
BASOS: 0 %
Basophils Absolute: 0 10*3/uL (ref 0.0–0.2)
EOS (ABSOLUTE): 0.2 10*3/uL (ref 0.0–0.4)
EOS: 2 %
HEMATOCRIT: 41 % (ref 37.5–51.0)
Hemoglobin: 13.9 g/dL (ref 13.0–17.7)
IMMATURE GRANULOCYTES: 1 %
Immature Grans (Abs): 0.1 10*3/uL (ref 0.0–0.1)
Lymphocytes Absolute: 1.8 10*3/uL (ref 0.7–3.1)
Lymphs: 19 %
MCH: 30.8 pg (ref 26.6–33.0)
MCHC: 33.9 g/dL (ref 31.5–35.7)
MCV: 91 fL (ref 79–97)
MONOS ABS: 0.7 10*3/uL (ref 0.1–0.9)
Monocytes: 7 %
NEUTROS ABS: 6.6 10*3/uL (ref 1.4–7.0)
NEUTROS PCT: 71 %
Platelets: 208 10*3/uL (ref 150–379)
RBC: 4.52 x10E6/uL (ref 4.14–5.80)
RDW: 14.3 % (ref 12.3–15.4)
WBC: 9.4 10*3/uL (ref 3.4–10.8)

## 2017-07-16 LAB — LIPID PANEL
CHOL/HDL RATIO: 2.1 ratio (ref 0.0–5.0)
CHOLESTEROL TOTAL: 109 mg/dL (ref 100–199)
HDL: 53 mg/dL (ref >39)
LDL Calculated: 40 mg/dL (ref 0–99)
TRIGLYCERIDES: 80 mg/dL (ref 0–149)
VLDL CHOLESTEROL CAL: 16 mg/dL (ref 5–40)

## 2017-07-16 LAB — GLUCOSE, POCT (MANUAL RESULT ENTRY): POC Glucose: 96 mg/dl (ref 70–99)

## 2017-07-16 LAB — POCT GLYCOSYLATED HEMOGLOBIN (HGB A1C): HEMOGLOBIN A1C: 6.7

## 2017-07-16 MED ORDER — RANITIDINE HCL 150 MG PO TABS: 150.0000 mg | ORAL_TABLET | Freq: Two times a day (BID) | ORAL | 0 refills | Status: DC

## 2017-07-16 NOTE — Patient Instructions (Addendum)
1. STOP the enalapril. 2. CONTINUE your other regular medications. 3. Increase your water intake. 4. STOP the TUMS 5. START the ranitidine for heartburn (anc consider raising the head of your bed about 6 inches). 6. If you have a way to check your blood pressure, please do it 2-3 times each week. If it consistently runs >140/90, RESTART the enalapril.    IF you received an x-ray today, you will receive an invoice from [facility]. Please contact [facility] at [phone] with questions or concerns regarding your invoice.   IF you received labwork today, you will receive an invoice from [facility]. Please contact [facility] at [phone] with questions or concerns regarding your invoice.   Our billing staff will not be able to assist you with questions regarding bills from these companies.  You will be contacted with the lab results as soon as they are available. The fastest way to get your results is to activate your My Chart account. Instructions are located on the last page of this paperwork. If you have not heard from [place] regarding the results in 2 weeks, please contact this office.

## 2017-07-16 NOTE — Assessment & Plan Note (Signed)
Continue management with neurology.

## 2017-07-16 NOTE — Assessment & Plan Note (Addendum)
Well controlled. Continue current treatment. 

## 2017-07-16 NOTE — Assessment & Plan Note (Addendum)
Hypotensive today. STOP enalapril. INCREASE oral hydration. Check BP at home. If consistently >140/90, let me know, and resume enalapril.

## 2017-07-16 NOTE — Progress Notes (Signed)
Patient ID: Danny [name], male    DOB: 01/30/1951, 66 y.o.   MRN: [id number]  PCP: [name], PA-C  Chief Complaint  Patient presents with  . Diabetes    per pt states sugars have been in the 90s at home  . Follow-up    Subjective:   Presents for evaluation of diabetes. He is accompanied by his wife, as usual. They have just returned form a family vacation in [place].  A1C in 03/2017 was 7.3%. Home glucose readings in the 90's (fasting). This morning it was 70.  Heartburn. Sometimes wakes him up during the night. Uses TUMS with good relief.  Fatigue. Chronic constipation. Mild light-headedness with rapid position change. He is not drinking as much as recommended, due to not wanting to urinate so much.  Parkinson's is managed by neurology, and continues to progress.  Review of Systems Constitutional: Positive for malaise/fatigue. Negative for chills, fever and weight loss.  HENT: Negative for congestion, hearing loss, sore throat and tinnitus.   Eyes: Negative for blurred vision and double vision.  Respiratory: Negative for cough, shortness of breath and wheezing.   Cardiovascular: Negative for chest pain, palpitations and leg swelling.  Gastrointestinal: Positive for constipation (Occasionally. Take miralax) and heartburn. Negative for diarrhea, nausea and vomiting.  Genitourinary: Negative.   Musculoskeletal: Positive for back pain.  Skin: Negative for rash.  Neurological: Positive for dizziness (When he stands up to fast). Negative for tingling and headaches.  Psychiatric/Behavioral: Negative for depression and suicidal ideas.     Patient Active Problem List   Diagnosis Date Noted  . Hematospermia 11/18/2015  . Epididymal cyst 05/25/2015  . Parkinson disease ([place]) 01/14/2014  . Obesity (BMI 30-39.9) 07/14/2013  . Confusional arousals 02/13/2013  . Disorder of ligament of right wrist 01/29/2012  . Diabetes mellitus type 2, uncomplicated ([place])  99/37/1696  . Mixed hyperlipidemia 10/09/2011  . OA (osteoarthritis) of knee 10/09/2011  . Essential hypertension, benign 10/09/2011     Prior to Admission medications   Medication Sig Start Date End Date Taking? Authorizing Provider  aspirin 81 MG tablet Take 81 mg by mouth daily.     Yes [provider]  Cinnamon 500 MG capsule Take 500 mg by mouth daily.   Yes [provider]  enalapril (VASOTEC) 2.5 MG tablet Take 1 tablet by mouth  daily 05/08/16  Yes Wardell Honour, MD  Ginger, Zingiber officinalis, 550 MG CAPS Take 550 mg by mouth daily.     Yes [provider]  glipiZIDE (GLUCOTROL XL) 10 MG 24 hr tablet TAKE 1 TABLET BY MOUTH DAILY 05/12/17  Yes Addi Pak, PA-C  Glucosamine-Chondroit-Vit C-Mn (GLUCOSAMINE CHONDR 1500 COMPLX PO) Take 1,500 mg by mouth daily.     Yes [provider]  glucose blood test strip Use as instructed 02/04/17  Yes Evamae Rowen, PA-C  Multiple Vitamin (MULTIVITAMIN) tablet Take 1 tablet by mouth daily.   Yes [provider]  pramipexole (MIRAPEX) 1 MG tablet TAKE 1 TABLET (1 MG TOTAL) BY MOUTH 3 (THREE) TIMES DAILY. 05/01/17  Yes [name], MD  simvastatin (ZOCOR) 40 MG tablet Take 0.5 tablets (20 mg total) by mouth daily. 02/04/17  Yes Nolia Tschantz, PA-C  sitaGLIPtin-metformin (JANUMET) 50-1000 MG tablet Take 1 tablet by mouth two  times daily with meals 05/07/17  Yes Jene Huq, PA-C  rasagiline (AZILECT) 1 MG TABS tablet 1 pill once daily Patient not taking: Reported on 04/09/2017 02/04/17   [name], PA-C  Allergies  Allergen Reactions  . Codeine Nausea And Vomiting       Objective:  Physical Exam  Constitutional: He is oriented to person, place, and time. He appears well-developed and well-nourished. He is active and cooperative. No distress.  BP 91/63 (BP Location: Right Arm, Patient Position: Sitting, Cuff Size: Large)   Pulse 90   Temp 98.2 F (36.8 C) (Oral)   Resp 18    Ht 5' 8.75" (1.746 m)   Wt 223 lb 12.8 oz (101.5 kg)   SpO2 94%   BMI 33.29 kg/m   HENT:  Head: Normocephalic and atraumatic.  Right Ear: Hearing normal.  Left Ear: Hearing normal.  Eyes: Conjunctivae are normal. No scleral icterus.  Neck: Normal range of motion. Neck supple. No thyromegaly present.  Cardiovascular: Normal rate, regular rhythm and normal heart sounds.   Pulses:      Radial pulses are 2+ on the right side, and 2+ on the left side.  Pulmonary/Chest: Effort normal and breath sounds normal.  Lymphadenopathy:       Head (right side): No tonsillar, no preauricular, no posterior auricular and no occipital adenopathy present.       Head (left side): No tonsillar, no preauricular, no posterior auricular and no occipital adenopathy present.    He has no cervical adenopathy.       Right: No supraclavicular adenopathy present.       Left: No supraclavicular adenopathy present.  Neurological: He is alert and oriented to person, place, and time. No sensory deficit.  Skin: Skin is warm, dry and intact. No rash noted. No cyanosis or erythema. Nails show no clubbing.  Psychiatric: His speech is normal and behavior is normal. His mood appears not anxious. His affect is blunt. His affect is not angry, not labile and not inappropriate. He does not exhibit a depressed mood.       Results for orders placed or performed in visit on 07/16/17  POCT glycosylated hemoglobin (Hb A1C)  Result Value Ref Range   Hemoglobin A1C 6.7   POCT glucose (manual entry)  Result Value Ref Range   POC Glucose 96 70 - 99 mg/dl  POCT urinalysis dipstick  Result Value Ref Range   Color, UA yellow yellow   Clarity, UA clear clear   Glucose, UA negative negative mg/dL   Bilirubin, UA small (A) negative   Ketones, POC UA small (15) (A) negative mg/dL   Spec Grav, UA >=1.030 (A) 1.010 - 1.025   Blood, UA negative negative   pH, UA 5.0 5.0 - 8.0   Protein Ur, POC trace (A) negative mg/dL   Urobilinogen,  UA 0.2 0.2 or 1.0 E.U./dL   Nitrite, UA Negative Negative   Leukocytes, UA Negative Negative   Diabetic Foot Exam - Simple   Simple Foot Form Diabetic Foot exam was performed with the following findings:  Yes 07/16/2017  9:31 AM  Visual Inspection No deformities, no ulcerations, no other skin breakdown bilaterally:  Yes Sensation Testing Intact to touch and monofilament testing bilaterally:  Yes Pulse Check Posterior Tibialis and Dorsalis pulse intact bilaterally:  Yes Comments        Assessment & Plan:   Problem List Items Addressed This Visit    Diabetes mellitus type 2, uncomplicated ([place]) - Primary (Chronic)    Well controlled. Continue current treatment.      Relevant Orders   POCT glycosylated hemoglobin (Hb A1C) (Completed)   POCT glucose (manual entry) (Completed)   Comprehensive metabolic panel (Completed)  POCT urinalysis dipstick (Completed)   Mixed hyperlipidemia (Chronic)    Await labs. Adjust regimen as indicated by results. Goal LDL <70.      Relevant Orders   Lipid panel (Completed)   Essential hypertension, benign (Chronic)    Hypotensive today. STOP enalapril. INCREASE oral hydration. Check BP at home. If consistently >140/90, let me know, and resume enalapril.      Relevant Orders   CBC with Differential/Platelet (Completed)   Parkinson disease ([place])    Continue management with neurology.       Other Visit Diagnoses    Heartburn       Stop TUMS, due to constipation. Try H2 blocker.   Relevant Medications   ranitidine (ZANTAC) 150 MG tablet       Return in about 3 months (around 10/16/2017) for re-evaluation of glucose, blood pressure, etc.   [name], PA-C [facility]

## 2017-07-16 NOTE — Progress Notes (Signed)
Chief Complaint  Patient presents with  . Diabetes    per pt states sugars have been in the 90s at home  . Follow-up   HPI Danny [name] is a 58 your old man with a significant past medical history for Parkinson's Disease and Type 2 Diabetes who comes to the office today for a follow up on his diabetes. His wife is present.  Labs drawn on 04/13/2017 were stable, with a notable A1C of 7.3 and fasting blood glucose of 123mg /dl.  Diabetes [name] checks his blood sugar once a day in the morning and has reported his normal to be in the 90s. This morning (07/16/2017) prior to his appointment, his blood sugar was in the 70s. He is currently in a fasted state and drank a small cup of black coffee.  He reports seeing his eye speclist this year, and goes every 6 months.  Hypertension His blood pressure is low today, 91/63, and does not take at home readings. He blood pressure from our last visit in April was 124/80. Wife notes has noticed he seem more fatigued. He attempts to drink 32 ounces of water a day. He admits to being dizzy after standing up.  [name] admits to not exercising.  Reflux [name] also reports heartburn at night that awakes him from sleep. He does not currently elevate the head of his bed. For relief, he uses Tums.  Parkison's Disease He reports his next visit with his neurologist in August, and goes every 6 months  BP 91/63 (BP Location: Right Arm, Patient Position: Sitting, Cuff Size: Large)   Pulse 90   Temp 98.2 F (36.8 C) (Oral)   Resp 18   Ht 5' 8.75" (1.746 m)   Wt 223 lb 12.8 oz (101.5 kg)   SpO2 94%   BMI 33.29 kg/m   Review of Systems  Constitutional: Positive for malaise/fatigue. Negative for chills, fever and weight loss.  HENT: Negative for congestion, hearing loss, sore throat and tinnitus.   Eyes: Negative for blurred vision and double vision.  Respiratory: Negative for cough, shortness of breath and wheezing.   Cardiovascular: Negative for  chest pain, palpitations and leg swelling.  Gastrointestinal: Positive for constipation (Occasionally. Take miralax) and heartburn. Negative for diarrhea, nausea and vomiting.  Genitourinary: Negative.   Musculoskeletal: Positive for back pain.  Skin: Negative for rash.  Neurological: Positive for dizziness (When he stands up to fast). Negative for tingling and headaches.  Psychiatric/Behavioral: Negative for depression and suicidal ideas.    Patient Active Problem List   Diagnosis Date Noted  . Hematospermia 11/18/2015  . Epididymal cyst 05/25/2015  . Parkinson disease ([place]) 01/14/2014  . Obesity (BMI 30-39.9) 07/14/2013  . Confusional arousals 02/13/2013  . Disorder of ligament of right wrist 01/29/2012  . Diabetes mellitus type 2, uncomplicated ([place]) 19/62/2297  . Mixed hyperlipidemia 10/09/2011  . OA (osteoarthritis) of knee 10/09/2011  . Essential hypertension, benign 10/09/2011   Allergies  Allergen Reactions  . Codeine Nausea And Vomiting   Prior to Admission medications   Medication Sig Start Date End Date Taking? Authorizing Provider  aspirin 81 MG tablet Take 81 mg by mouth daily.     Yes [provider]  Cinnamon 500 MG capsule Take 500 mg by mouth daily.   Yes [provider]  enalapril (VASOTEC) 2.5 MG tablet Take 1 tablet by mouth  daily 05/08/16  Yes Wardell Honour, MD  Ginger, Zingiber officinalis, 550 MG CAPS Take 550 mg by mouth  daily.     Yes [provider]  glipiZIDE (GLUCOTROL XL) 10 MG 24 hr tablet TAKE 1 TABLET BY MOUTH DAILY 05/12/17  Yes Jeffery, Chelle, PA-C  Glucosamine-Chondroit-Vit C-Mn (GLUCOSAMINE CHONDR 1500 COMPLX PO) Take 1,500 mg by mouth daily.     Yes [provider]  glucose blood test strip Use as instructed 02/04/17  Yes Jeffery, Chelle, PA-C  Multiple Vitamin (MULTIVITAMIN) tablet Take 1 tablet by mouth daily.   Yes [provider]  pramipexole (MIRAPEX) 1 MG tablet TAKE 1 TABLET (1 MG TOTAL) BY  MOUTH 3 (THREE) TIMES DAILY. 05/01/17  Yes [name], MD  simvastatin (ZOCOR) 40 MG tablet Take 0.5 tablets (20 mg total) by mouth daily. 02/04/17  Yes Jeffery, Chelle, PA-C  sitaGLIPtin-metformin (JANUMET) 50-1000 MG tablet Take 1 tablet by mouth two  times daily with meals 05/07/17  Yes Jeffery, Chelle, PA-C   Vital Signs: BP 91/63 (BP Location: Right Arm, Patient Position: Sitting, Cuff Size: Large)   Pulse 90   Temp 98.2 F (36.8 C) (Oral)   Resp 18   Ht 5' 8.75" (1.746 m)   Wt 223 lb 12.8 oz (101.5 kg)   SpO2 94%   BMI 33.29 kg/m   Physical Exam  Eyes:  Fundoscopic exam:      The right eye shows red reflex.       The left eye shows red reflex.  Cardiovascular: Normal rate, regular rhythm and normal heart sounds.   Pulses:      Carotid pulses are 2+ on the right side, and 2+ on the left side.      Radial pulses are 2+ on the right side, and 2+ on the left side.       Dorsalis pedis pulses are 2+ on the right side, and 2+ on the left side.  Pulmonary/Chest: Effort normal and breath sounds normal.  Psychiatric:  Patient showed little emotion during the visit and at times was uninterested.    Diabetic Foot Exam - Simple   Simple Foot Form Diabetic Foot exam was performed with the following findings:  Yes 07/16/2017  9:31 AM  Visual Inspection No deformities, no ulcerations, no other skin breakdown bilaterally:  Yes Sensation Testing Intact to touch and monofilament testing bilaterally:  Yes Pulse Check Posterior Tibialis and Dorsalis pulse intact bilaterally:  Yes Comments     Results for orders placed or performed in visit on 07/16/17  POCT glycosylated hemoglobin (Hb A1C)  Result Value Ref Range   Hemoglobin A1C 6.7   POCT glucose (manual entry)  Result Value Ref Range   POC Glucose 96 70 - 99 mg/dl  POCT urinalysis dipstick  Result Value Ref Range   Color, UA yellow yellow   Clarity, UA clear clear   Glucose, UA negative negative mg/dL   Bilirubin, UA small  (A) negative   Ketones, POC UA small (15) (A) negative mg/dL   Spec Grav, UA >=1.030 (A) 1.010 - 1.025   Blood, UA negative negative   pH, UA 5.0 5.0 - 8.0   Protein Ur, POC trace (A) negative mg/dL   Urobilinogen, UA 0.2 0.2 or 1.0 E.U./dL   Nitrite, UA Negative Negative   Leukocytes, UA Negative Negative   Assessment and Plan 1. Type 2 diabetes mellitus without complication, without long-term current use of insulin (HCC) Continue healthy diet choices. Try to get 150 minutes per week of cardio  Return to office in 3 months for re-evaluation - POCT glycosylated hemoglobin (Hb A1C) - POCT  glucose (manual entry) - Comprehensive metabolic panel - POCT urinalysis dipstick  2. Essential hypertension, benign Discontinue enalaprill because it is contributing to your low BP. If you have a way to check your blood pressure, please do it 2-3 times each week. If it consistently runs >140/90, restart the enalapril. Urine results also showed dehydration. Increase water intake. Return to office in 3 months for re-evaluation - CBC with Differential/Platelet  3. Mixed hyperlipidemia - Lipid panel  4. Parkinson disease ([place])  5. Heartburn TUMS can cause constipation. Switched to an H2 blocker Elevate the head of your bed at least 6 inches - ranitidine (ZANTAC) 150 MG tablet; Take 1 tablet (150 mg total) by mouth 2 (two) times daily.  Dispense: 180 tablet; Refill: 0  Myeshia Fojtik "Mia" Amatullah Christy, PA-Student [facility] of Medicine Physician Assistant Program 07/17/17 10:30 AM

## 2017-07-18 NOTE — Assessment & Plan Note (Signed)
Await labs. Adjust regimen as indicated by results. Goal LDL <70 

## 2017-07-27 ENCOUNTER — Other Ambulatory Visit: Payer: Self-pay | Admitting: Physician Assistant

## 2017-07-27 DIAGNOSIS — E119 Type 2 diabetes mellitus without complications: Secondary | ICD-10-CM

## 2017-08-06 ENCOUNTER — Encounter: Payer: Self-pay | Admitting: Physician Assistant

## 2017-08-09 ENCOUNTER — Encounter: Payer: Self-pay | Admitting: Physician Assistant

## 2017-08-09 DIAGNOSIS — G4752 REM sleep behavior disorder: Secondary | ICD-10-CM

## 2017-08-09 DIAGNOSIS — G20 Parkinson's disease: Secondary | ICD-10-CM

## 2017-08-12 MED ORDER — PRAMIPEXOLE DIHYDROCHLORIDE 1 MG PO TABS: 1.0000 mg | ORAL_TABLET | Freq: Three times a day (TID) | ORAL | 1 refills | Status: DC

## 2017-09-09 ENCOUNTER — Encounter: Payer: Self-pay | Admitting: Neurology

## 2017-09-09 ENCOUNTER — Ambulatory Visit (INDEPENDENT_AMBULATORY_CARE_PROVIDER_SITE_OTHER): Payer: Medicare Other | Admitting: Neurology

## 2017-09-09 VITALS — BP 111/76 | HR 83 | Ht 71.0 in | Wt 223.0 lb

## 2017-09-09 DIAGNOSIS — G20 Parkinson's disease: Secondary | ICD-10-CM | POA: Diagnosis not present

## 2017-09-09 MED ORDER — CARBIDOPA-LEVODOPA 25-100 MG PO TABS: ORAL_TABLET | ORAL | 3 refills | Status: DC

## 2017-09-09 NOTE — Progress Notes (Signed)
Subjective:    Patient ID: Danny [name] is a 66 y.o. male.  HPI     Interim history:  Mr. Noone is a very pleasant 66 year old right-handed gentleman with an underlying medical history of type 2 diabetes, osteoarthritis, hypertension, obesity, hyperlipidemia, and cataracts, who presents for follow-up consultation of his Parkinson's disease, with history of RBD. The patient is accompanied by his wife today. I last saw him on 03/04/2017, at which time he felt stable. He retired in December 2017. The daughter was living with them. Mother-in-law had moved in as well. His wife retired also. He was going to the gym on a regular basis. Memory-wise, he had mild forgetfulness, no mood issues, no sleep issues, no side effects from the Mirapex. He had no recent falls thankfully. He had some mild dream enactments but no major issues. Looking back, his symptoms of peak date back to 2014, he had a sleep study in early 2014 with PLMS noted but no OSA and he denies restless leg symptoms. We mutually agreed to taper him off of Azilect as it was too expensive. I suggested in lieu of the Azilect, we increase his pramipexole to 1 mg 3 times a day.  Today, 03/04/2017 (all dictated new, as well as above notes, some dictation done in note pad or Word, outside of chart, may appear as copied):    He reports doing okay, wife reports, that he has been more sleepy. He has been more preoccupied with sex, which concerns his wife. He is not very active. Some milder depressive symptoms. Some issues with double vision. Has prisms. Has intermittent mild dream enactments, sometimes she sleeps in a different bedroom. He has not had any falls. He does feel lightheaded sometimes.   The patient's allergies, current medications, family history, past medical history, past social history, past surgical history and problem list were reviewed and updated as appropriate.    Previously (copied from previous notes for reference):    I saw  him on 09/03/2016, at which time he reported doing well, no telltale differences after increasing the Mirapex. He gave up driving. He decided to retire by the end of December 2017. His mother-in-law was supposed to move in with them. His memory was stable, sleep was stable, no real mood issues, no recent falls. I suggested we keep his medications the same including Azilect 1 mg once daily, Mirapex 0.75 mg 3 times a day.   I saw him on 02/27/2016, at which time he reported more difficulty with his walking, he had become slower. Wife had noted that it would take him longer to do things. He was dragging his feet at times. He was not swinging his arms as well. He was working full-time as a Land, avoiding stairs and heights. His A1c in December 2016 was a little up at 7.6. Memory was stable, with some mild forgetfulness. Mood was stable, RBD was infrequent. He reported no recent falls. I suggested he continue with Azilect and we increased his Mirapex to 0.75 mg 3 times a day.    I saw him on 08/29/2015 at which time he reported doing fairly well. He was able to tolerate Azilect. His Mirapex was 0.5 mg 3 times a day. He was working at the computer most of the time. He was not always drinking enough water. He had a good appetite. He felt his memory was stable. RBD was less frequent but still occurring. His wife was able to sleep in the same bed with him.  He had no recent falls. He had some recent blurry vision was going to see his ophthalmologist. We mutually agreed to continue his current medication regimen.   I saw him on 04/25/2015, at which time he reported doing fairly well overall, but had noted smaller changes, including more slowness, difficulty with climbing ladders. He was advised by his boss to not climb ladders or walk over obstacles at work. Thankfully, his work environment is understanding. He had another business trip coming up and a colleague was to drive and do the climbing on the  sites. He reported mild forgetfulness and some problems with depth perception. His diplopia had improved since he was given prism glasses by his ophthalmologist. I suggested, we try him on Azilect and keep his Mirapex the same.    I saw him on 02/04/2015, at which time he reported ongoing issues with diplopia, particularly at night while driving. He was still working full-time. He was still on low-dose Mirapex, 0.25 mg 3 times a day. He had no side effects. He had had a sleep study which per his report did not show any OSA. I asked him to make an appointment with his eye doctor. I suggested an increase in Mirapex to 0.5 mg 3 times a day.   I first met him on 12/28/2014, at which time he reported that he was having more fatigue. However, he also had reduce his caffeine intake. He had no recent falls and mood stable. He was working full-time. He reported that he had intermittent double vision in the last 3 months. His PCP requested a sooner than scheduled appointment for diplopia. His exam was stable at the time and I suggested no new medication changes.   He has had some short-term memory issues. He works full-time. He is an Chief Financial Officer. He likes square dance but his wife has noted that he has had more problems with his nighttime driving and his squared hands. In the recent 3 months he has had some intermittent double vision at night. He has drooling at night. He rarely drinks alcohol and usually drinks 4-5 cups of coffee per day, occasional sodas. He quit smoking in 1978. Blood pressure is low for him today. It was recently noted to be trending lower and his blood pressure medication was reduced. He takes his cholesterol medication every other day.   He had a brain MRI without contrast on 11/04/2013: Normal MRI scan of the brain. Incidental findings of a chronic paranasal sinusitis with deviated nasal septum to the left.  He previously followed with Dr. Jim Like and was last seen by him on 08/30/2014, at  which time his Mirapex was kept at 0.25 mg 3 times a day. He reported no side effects, in particular no impulse control disorder. I reviewed Dr. Hazle Quant notes. He first met Dr. Janann Colonel on 10/26/13, at which time the patient reported a right hand tremor noticed over the previous 4-6 months. He had changes in his handwriting, slowness, some stiffness and changes in his walking.  His wife reported a longer standing history of REM behavior disorder. He has been in outpatient physical therapy.   His Past Medical History Is Significant For: Past Medical History:  Diagnosis Date  . Cataract   . Essential hypertension, benign   . Mixed hyperlipidemia   . OA (osteoarthritis) of knee    right  . Obesity, unspecified   . Parkinsons ([place])   . Type II or unspecified type diabetes mellitus without mention of complication, not stated as  uncontrolled     His Past Surgical History Is Significant For: Past Surgical History:  Procedure Laterality Date  . GANGLION CYST EXCISION  age 36 years   right  . KNEE ARTHROSCOPY     right  . WRIST SURGERY  04/2012    His Family History Is Significant For: Family History  Problem Relation Age of Onset  . Diabetes Sister   . Diabetes Daughter   . Cancer Father   . Heart disease Father   . Diabetes Mother     His Social History Is Significant For: Social History   Social History  . Marital status: Married    Spouse name: [name]  . Number of children: 3  . Years of education: 29   Occupational History  . Engineer    Social History Main Topics  . Smoking status: Former Smoker    Packs/day: 1.50    Years: 15.00    Types: Cigarettes    Quit date: 12/17/1982  . Smokeless tobacco: Never Used     Comment: over 40 yrs  . Alcohol use No     Comment: very rare  . Drug use: No  . Sexual activity: Yes    Partners: Female   Other Topics Concern  . None   Social History Narrative   Research officer, trade union, motorcycle rider.  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  He  has 3 adult children.  His wife had 2 children, one of whom is deceased.   Patient is working full-time.   Patient has a Bachelor's degree.   Patient is right-handed.   Patient drinks 6 cups of coffee daily.    His Allergies Are:  Allergies  Allergen Reactions  . Codeine Nausea And Vomiting  :   His Current Medications Are:  Outpatient Encounter Prescriptions as of 09/09/2017  Medication Sig  . aspirin 81 MG tablet Take 81 mg by mouth daily.    . Cinnamon 500 MG capsule Take 500 mg by mouth daily.  . enalapril (VASOTEC) 2.5 MG tablet Take 1 tablet by mouth  daily (Patient taking differently: Take 2.5 mg by mouth every other day. Take 1 tablet by mouth  daily)  . Ginger, Zingiber officinalis, 550 MG CAPS Take 550 mg by mouth daily.    Marland Kitchen glipiZIDE (GLUCOTROL XL) 10 MG 24 hr tablet TAKE 1 TABLET BY MOUTH DAILY  . Glucosamine-Chondroit-Vit C-Mn (GLUCOSAMINE CHONDR 1500 COMPLX PO) Take 1,500 mg by mouth daily.    Marland Kitchen glucose blood test strip Use as instructed  . Multiple Vitamin (MULTIVITAMIN) tablet Take 1 tablet by mouth daily.  . pramipexole (MIRAPEX) 1 MG tablet Take 1 tablet (1 mg total) by mouth 3 (three) times daily.  . ranitidine (ZANTAC) 150 MG tablet Take 1 tablet (150 mg total) by mouth 2 (two) times daily.  . simvastatin (ZOCOR) 40 MG tablet TAKE 0.5 TABLETS (20 MG TOTAL) BY MOUTH DAILY.  . sitaGLIPtin-metformin (JANUMET) 50-1000 MG tablet Take 1 tablet by mouth two  times daily with meals   No facility-administered encounter medications on file as of 09/09/2017.   :  Review of Systems:  Out of a complete 14 point review of systems, all are reviewed and negative with the exception of these symptoms as listed below:  Review of Systems  Neurological:       Patient says that he has been doing fine and has not had any recent falls. Patients wife is concerned about his progression and is asking what "stage" he is in. Pt  wife reports that patient will sit down and go right to sleep,  that his eye sight is getting worse, and he has more "down days."     Objective:  Neurological Exam  Physical Exam Physical Examination:   Vitals:   09/09/17 1036  BP: 111/76  Pulse: 83   General Examination: The patient is a very pleasant 66 y.o. male in no acute distress. He appears well-developed and well-nourished and well groomed.   HEENT: Normocephalic, atraumatic, pupils are equal, round and reactive to light and accommodation. Extraocular tracking shows mild saccadic breakdown, no nystagmus, no significant limitation to downgaze, slight limitation to upper gaze. He has mild decrease in eye blink rate, status post bilateral cataract repairs, hearing is grossly intact, bilateral hearing aids in place. Prism eye glasses. Face is mildly masked, neck is mild to moderately rigid, airway examination reveals mild mouth dryness, no significant airway crowding, Mallampati class II, no sialorrhea. Tongue protrudes centrally and palate elevates symmetrically. Mild hypophonia, no dysarthria noted.  Chest: Clear to auscultation without wheezing, rhonchi or crackles noted.  Heart: S1+S2+0, regular and normal without murmurs, rubs or gallops noted.   Abdomen: Soft, non-tender and non-distended with normal bowel sounds appreciated on auscultation.  Extremities: There is no pitting edema in the distal lower extremities bilaterally. Pedal pulses are intact.  Skin: Warm and dry without trophic changes noted.  Musculoskeletal: exam reveals no obvious joint deformities, tenderness or joint swelling or erythema.   Neurologically:  Mental status: The patient is awake, alert and oriented in all 4 spheres. His immediate and remote memory, attention, language skills and fund of knowledge are appropriate. There is no evidence of aphasia, agnosia, apraxia or anomia. Speech is clear with normal prosody and enunciation. Mild hypophonia, thought process is linear. Mood is normal and affect is normal.   Cranial nerves II - XII are as described above under HEENT exam. In addition: shoulder shrug is normal with equal shoulder height noted. Motor exam: Normal bulk, and strength is noted. He has mild increase in tone on the right side. Overall mild bradykinesia is noted, no consistent resting tremor, a slight intermittent resting tremor is noted in the right upper extremity only, no other resting tremor. Fine motor skills are mild to moderately impaired on the right and mildly impaired on the left, fairly stable. Sensory exam is intact to light touch. Cerebellar testing shows no dysmetria or intention tremor, no ataxia. Gait, station and balance: He stands with mild difficulty, he does push himself up with his hands. He has a mild lean to the left, mild to moderately stooped posture, slightly worse. Stance is naturally slightly wide-based. He walks with decreased arm swing on the right. Balance is mildly impaired, but stable.  Assessment and plan:    In summary, ATILANO COVELLI is a very pleasant 66 year old male with an underlying medical history of type 2 diabetes, osteoarthritis, hypertension, obesity, hyperlipidemia, cataracts with status post surgeries, who presents for follow-up consultation of his right-sided predominant Parkinson's disease, complicated by mild RBD, more recent sleepiness. Mood has been recently mildly depressed but nothing sustained. Wife's report. He was on low-dose Mirapex for years. He could no longer afford Azilect and we tapered it off in March 2018. He is currently on Mirapex 1 mg 3 times a day. He may be experiencing side effects with the Mirapex, particularly sleepiness and at times being preoccupied with sex. Memory is stable. We will continue to monitor. At this juncture, he is advised to start  Sinemet 25-100 milligrams strength with gradual titration from half a pill twice a day for 1 week to half pill 3 times a day for 1 week then 1 pill 3 times a day thereafter. After  that we will consider tapering the Mirapex. Wife reports that she has to 0.75 mg strength at the house and we could utilize those in the near future. For now will he is going to continue with the Mirapex as well. I suggested a three-month recheck, sooner as needed. I answered all their questions today and he was given a new prescription as well as written instructions.  He gave up driving in 1655.  I spent 25 minutes in total face-to-face time with the patient, more than 50% of which was spent in counseling and coordination of care, reviewing test results, reviewing medication and discussing or reviewing the diagnosis of PD, its prognosis and treatment options. Pertinent laboratory and imaging test results that were available during this visit with the patient were reviewed by me and considered in my medical decision making (see chart for details).

## 2017-09-09 NOTE — Patient Instructions (Addendum)
As discussed, we will introduce: Sinemet (generic name: carbidopa-levodopa) 25/100 mg: Take half a pill twice daily (8 AM and noon) for one week, then half a pill 3 times a day (8 AM, noon, and 4 PM) for one week, then one pill 3 times a day thereafter. Please try to take the medication away from you mealtimes, that is, ideally either one hour before or 2 hours after your meal to ensure optimal absorption. The medication can interfere with the protein content of your meal and trying to the protein in your food and therefore not get fully absorbed.  Common side effects reported are: Nausea, vomiting, sedation, confusion, lightheadedness. Rare side effects include hallucinations, severe nausea or vomiting, diarrhea and significant drop in blood pressure especially when going from lying to standing or from sitting to standing.   As far as the Mirapex, we will try to lower the dose in the near future, as you may be experience some side effects: Common side effects reported are: Sedation, sleepiness, nausea, vomiting, and rare side effects are confusion, hallucinations, swelling in legs, and abnormal behaviors, including impulse control problems, which can manifest as excessive eating, obsessions with food or gambling, or hypersexuality.  We will do 3 month recheck.

## 2017-10-13 ENCOUNTER — Encounter: Payer: Self-pay | Admitting: Physician Assistant

## 2017-10-14 ENCOUNTER — Other Ambulatory Visit: Payer: Self-pay

## 2017-10-14 ENCOUNTER — Telehealth: Payer: Self-pay | Admitting: Neurology

## 2017-10-14 MED ORDER — SIMVASTATIN 40 MG PO TABS: 20.0000 mg | ORAL_TABLET | Freq: Every day | ORAL | 0 refills | Status: DC

## 2017-10-14 NOTE — Telephone Encounter (Signed)
Pt wife(listed under alt contacts) has called asking for a call re: pramipexole (MIRAPEX) 1 MG tablet she states Dr Rexene Alberts mentioned gradually bringing pt off of this medication and wife is asking for a call as to when she is to start.  Please call

## 2017-10-14 NOTE — Telephone Encounter (Signed)
I called pt, spoke to pt's wife and pt. They report that pt is doing well on the sinemet 1 tablet TID. They are wondering when and how the pt should start tapering off of mirapex. Pt has a follow up on 12/05/2017. I advised pt that I will speak with Dr. Rexene Alberts regarding this and call them back.

## 2017-10-15 ENCOUNTER — Encounter: Payer: Self-pay | Admitting: Physician Assistant

## 2017-10-15 ENCOUNTER — Ambulatory Visit (INDEPENDENT_AMBULATORY_CARE_PROVIDER_SITE_OTHER): Payer: Medicare Other | Admitting: Physician Assistant

## 2017-10-15 VITALS — BP 98/80 | HR 88 | Temp 98.1°F | Resp 18 | Ht 71.0 in | Wt 223.4 lb

## 2017-10-15 DIAGNOSIS — G20 Parkinson's disease: Secondary | ICD-10-CM

## 2017-10-15 DIAGNOSIS — I10 Essential (primary) hypertension: Secondary | ICD-10-CM | POA: Diagnosis not present

## 2017-10-15 DIAGNOSIS — E782 Mixed hyperlipidemia: Secondary | ICD-10-CM

## 2017-10-15 DIAGNOSIS — E119 Type 2 diabetes mellitus without complications: Secondary | ICD-10-CM

## 2017-10-15 MED ORDER — PRAMIPEXOLE DIHYDROCHLORIDE 0.75 MG PO TABS: 0.7500 mg | ORAL_TABLET | Freq: Three times a day (TID) | ORAL | 3 refills | Status: DC

## 2017-10-15 MED ORDER — SITAGLIPTIN PHOS-METFORMIN HCL 50-1000 MG PO TABS: ORAL_TABLET | ORAL | 3 refills | Status: DC

## 2017-10-15 MED ORDER — GLIPIZIDE ER 10 MG PO TB24: ORAL_TABLET | ORAL | 3 refills | Status: DC

## 2017-10-15 MED ORDER — SIMVASTATIN 40 MG PO TABS: 20.0000 mg | ORAL_TABLET | ORAL | 3 refills | Status: DC

## 2017-10-15 NOTE — Telephone Encounter (Signed)
I called pt. I advised him that Dr. Rexene Alberts has reduced his mirapex from 1mg  TID to 0.75mg  TID and sent this RX to the [facility] on [address]. Pt verbalized understanding of this change. Pt had no questions at this time but was encouraged to call back if questions arise.

## 2017-10-15 NOTE — Telephone Encounter (Signed)
Please ask them to reduce Mirapex to 0.75 mg one pill tid, reduced from 1 mg tid. I adjusted Rx and sent to pharmacy on file.

## 2017-10-15 NOTE — Patient Instructions (Addendum)
REDUCE the enalapril to 1/2 tablet. If you cannot cut it, then take 1 tablet every other day.    IF you received an x-ray today, you will receive an invoice from [facility]. Please contact [facility] at [phone] with questions or concerns regarding your invoice.   IF you received labwork today, you will receive an invoice from [facility]. Please contact [facility] at [phone] with questions or concerns regarding your invoice.   Our billing staff will not be able to assist you with questions regarding bills from these companies.  You will be contacted with the lab results as soon as they are available. The fastest way to get your results is to activate your My Chart account. Instructions are located on the last page of this paperwork. If you have not heard from [place] regarding the results in 2 weeks, please contact this office.

## 2017-10-15 NOTE — Assessment & Plan Note (Signed)
Consider memory loss due to disease progression. Continue follow-up per neurology.

## 2017-10-15 NOTE — Assessment & Plan Note (Signed)
Continue Janumet. Await labs. Adjust regimen as indicated by results.

## 2017-10-15 NOTE — Assessment & Plan Note (Signed)
Hypotensive again today. REDUCE enalapril to 1/2 tablet, or QOD, since the pill is quite small. If remains low, would D/C.

## 2017-10-15 NOTE — Assessment & Plan Note (Signed)
Tolerating reduced dose of simvastatin. Await labs. Adjust regimen as indicated by results.

## 2017-10-15 NOTE — Progress Notes (Signed)
Patient ID: Danny [name], male    DOB: 04-17-1951, 66 y.o.   MRN: [id number]  PCP: [name], PA-C  Chief Complaint  Patient presents with  . Diabetes  . Hypotension  . Hyperlipidemia  . Follow-up    Pt wants high dose flu vaccine from pharmacy.    Subjective:   Presents for evaluation of diabetes, HTN and hyperlipidemia.  He's had a change in his Parkinson's treatment, due to adverse effects. It seems to be helping, and he'll follow-up with Dr. Rexene Alberts in December.  Tolerating his other medications well. Did not reduce the enalapril-couldn't remember which one I'd advised to cut back. Taking the simvastatin QOD has resolved the muscle pain.  Diabetes has been well controlled, but A1C has been rising since his diagnosis of Parkinson's, I think primarily due to decreased physical activity (he used to be very active). Last A1C was 6.7% (07/16/2017).  Has started to forget things. For example, when playing cards, he forgets what he's meant to do. He has insight into the memory loss, and frequently comments on it to his wife.   Review of Systems  Constitutional: Positive for fatigue. Negative for activity change, appetite change and unexpected weight change.  HENT: Negative for congestion, dental problem, ear pain, hearing loss, mouth sores, postnasal drip, rhinorrhea, sneezing, sore throat, tinnitus and trouble swallowing.   Eyes: Negative for photophobia, pain, redness and visual disturbance.  Respiratory: Negative for cough, chest tightness and shortness of breath.   Cardiovascular: Negative for chest pain, palpitations and leg swelling.  Gastrointestinal: Negative for abdominal pain, blood in stool, constipation, diarrhea, nausea and vomiting.  Endocrine: Negative for cold intolerance, heat intolerance, polydipsia, polyphagia and polyuria.  Genitourinary: Negative for dysuria, frequency, hematuria and urgency.  Musculoskeletal: Negative for arthralgias, gait  problem, myalgias and neck stiffness.  Skin: Negative for rash.  Neurological: Negative for dizziness, speech difficulty, weakness, light-headedness, numbness and headaches.  Hematological: Negative for adenopathy.  Psychiatric/Behavioral: Negative for confusion (memory loss-short term-mild-has insight) and sleep disturbance. The patient is not nervous/anxious.        Patient Active Problem List   Diagnosis Date Noted  . Hematospermia 11/18/2015  . Epididymal cyst 05/25/2015  . Parkinson disease ([place]) 01/14/2014  . Obesity (BMI 30-39.9) 07/14/2013  . Confusional arousals 02/13/2013  . Disorder of ligament of right wrist 01/29/2012  . Diabetes mellitus type 2, uncomplicated ([place]) 46/56/8127  . Mixed hyperlipidemia 10/09/2011  . OA (osteoarthritis) of knee 10/09/2011  . Essential hypertension, benign 10/09/2011     Prior to Admission medications   Medication Sig Start Date End Date Taking? Authorizing Provider  aspirin 81 MG tablet Take 81 mg by mouth daily.     Yes [provider]  carbidopa-levodopa (SINEMET IR) 25-100 MG tablet Take 1/2 pill twice daily x 1 week, then 1/2 pill 3 times a day x 1 week, then 1 pill 3 times a day thereafter. 09/09/17  Yes [name], MD  Cinnamon 500 MG capsule Take 500 mg by mouth daily.   Yes [provider]  enalapril (VASOTEC) 2.5 MG tablet Take 1 tablet by mouth  daily 05/08/16  Yes Wardell Honour, MD  Ginger, Zingiber officinalis, 550 MG CAPS Take 550 mg by mouth daily.     Yes [provider]  glipiZIDE (GLUCOTROL XL) 10 MG 24 hr tablet TAKE 1 TABLET BY MOUTH DAILY 05/12/17  Yes Ruel Dimmick, PA-C  Glucosamine-Chondroit-Vit C-Mn (GLUCOSAMINE CHONDR 1500 COMPLX PO) Take 1,500 mg by mouth  daily.     Yes [provider]  glucose blood test strip Use as instructed 02/04/17  Yes Sindi Beckworth, PA-C  Multiple Vitamin (MULTIVITAMIN) tablet Take 1 tablet by mouth daily.   Yes [provider]    pramipexole (MIRAPEX) 1 MG tablet Take 1 tablet (1 mg total) by mouth 3 (three) times daily. 08/12/17  Yes Dulse Rutan, PA-C  ranitidine (ZANTAC) 150 MG tablet Take 1 tablet (150 mg total) by mouth 2 (two) times daily. 07/16/17  Yes Abia Monaco, PA-C  simvastatin (ZOCOR) 40 MG tablet Take 0.5 tablets (20 mg total) by mouth daily. Patient takes differently: QOD due to myalgias 10/14/17  Yes Belvia Gotschall, PA-C  sitaGLIPtin-metformin (JANUMET) 50-1000 MG tablet Take 1 tablet by mouth two  times daily with meals 05/07/17  Yes Clyde Zarrella, PA-C     Allergies  Allergen Reactions  . Codeine Nausea And Vomiting       Objective:  Physical Exam  Constitutional: He is oriented to person, place, and time. He appears well-developed and well-nourished. He is active and cooperative. No distress.  BP 98/80 (BP Location: Left Arm, Patient Position: Sitting, Cuff Size: Normal)   Pulse 88   Temp 98.1 F (36.7 C) (Oral)   Resp 18   Ht 5\' 11"  (1.803 m)   Wt 223 lb 6.4 oz (101.3 kg)   SpO2 96%   BMI 31.16 kg/m   HENT:  Head: Normocephalic and atraumatic.  Right Ear: Hearing normal.  Left Ear: Hearing normal.  Eyes: Conjunctivae are normal. No scleral icterus.  Neck: Normal range of motion. Neck supple. No thyromegaly present.  Cardiovascular: Normal rate, regular rhythm and normal heart sounds.   Pulses:      Radial pulses are 2+ on the right side, and 2+ on the left side.  Pulmonary/Chest: Effort normal and breath sounds normal.  Lymphadenopathy:       Head (right side): No tonsillar, no preauricular, no posterior auricular and no occipital adenopathy present.       Head (left side): No tonsillar, no preauricular, no posterior auricular and no occipital adenopathy present.    He has no cervical adenopathy.       Right: No supraclavicular adenopathy present.       Left: No supraclavicular adenopathy present.  Neurological: He is alert and oriented to person, place, and time. No  sensory deficit.  Skin: Skin is warm, dry and intact. No rash noted. No cyanosis or erythema. Nails show no clubbing.  Psychiatric: He has a normal mood and affect. His speech is normal and behavior is normal.       Assessment & Plan:   Problem List Items Addressed This Visit    Diabetes mellitus type 2, uncomplicated ([place]) - Primary (Chronic)    Continue Janumet. Await labs. Adjust regimen as indicated by results.      Relevant Medications   sitaGLIPtin-metformin (JANUMET) 50-1000 MG tablet   glipiZIDE (GLUCOTROL XL) 10 MG 24 hr tablet   simvastatin (ZOCOR) 40 MG tablet   Other Relevant Orders   Comprehensive metabolic panel   Hemoglobin A1c   Mixed hyperlipidemia (Chronic)    Tolerating reduced dose of simvastatin. Await labs. Adjust regimen as indicated by results.      Relevant Medications   simvastatin (ZOCOR) 40 MG tablet   Other Relevant Orders   Comprehensive metabolic panel   Lipid panel   Essential hypertension, benign (Chronic)    Hypotensive again today. REDUCE enalapril to 1/2 tablet, or QOD, since  the pill is quite small. If remains low, would D/C.      Relevant Medications   simvastatin (ZOCOR) 40 MG tablet   Other Relevant Orders   CBC with Differential/Platelet   Comprehensive metabolic panel   Parkinson disease ([place])    Consider memory loss due to disease progression. Continue follow-up per neurology.          Return in about 3 months (around 01/15/2018) for re-evaluation of diabetes, cholesterol and blood pressure.   [name], PA-C [facility]

## 2017-10-15 NOTE — Addendum Note (Signed)
Addended by: [name] on: 10/15/2017 01:23 PM   Modules accepted: Orders

## 2017-10-16 LAB — CBC WITH DIFFERENTIAL/PLATELET
BASOS: 0 %
Basophils Absolute: 0 10*3/uL (ref 0.0–0.2)
EOS (ABSOLUTE): 0.2 10*3/uL (ref 0.0–0.4)
Eos: 2 %
Hematocrit: 42.1 % (ref 37.5–51.0)
Hemoglobin: 14.5 g/dL (ref 13.0–17.7)
IMMATURE GRANULOCYTES: 1 %
Immature Grans (Abs): 0.1 10*3/uL (ref 0.0–0.1)
LYMPHS ABS: 1.7 10*3/uL (ref 0.7–3.1)
LYMPHS: 18 %
MCH: 30.8 pg (ref 26.6–33.0)
MCHC: 34.4 g/dL (ref 31.5–35.7)
MCV: 89 fL (ref 79–97)
MONOS ABS: 0.6 10*3/uL (ref 0.1–0.9)
Monocytes: 7 %
Neutrophils Absolute: 6.7 10*3/uL (ref 1.4–7.0)
Neutrophils: 72 %
PLATELETS: 195 10*3/uL (ref 150–379)
RBC: 4.71 x10E6/uL (ref 4.14–5.80)
RDW: 14.9 % (ref 12.3–15.4)
WBC: 9.3 10*3/uL (ref 3.4–10.8)

## 2017-10-16 LAB — COMPREHENSIVE METABOLIC PANEL
A/G RATIO: 1.6 (ref 1.2–2.2)
ALT: 25 IU/L (ref 0–44)
AST: 21 IU/L (ref 0–40)
Albumin: 4.4 g/dL (ref 3.6–4.8)
Alkaline Phosphatase: 60 IU/L (ref 39–117)
BUN/Creatinine Ratio: 16 (ref 10–24)
BUN: 18 mg/dL (ref 8–27)
Bilirubin Total: 0.9 mg/dL (ref 0.0–1.2)
CALCIUM: 10 mg/dL (ref 8.6–10.2)
CO2: 23 mmol/L (ref 20–29)
Chloride: 103 mmol/L (ref 96–106)
Creatinine, Ser: 1.13 mg/dL (ref 0.76–1.27)
GFR calc Af Amer: 78 mL/min/{1.73_m2} (ref >59)
GFR, EST NON AFRICAN AMERICAN: 68 mL/min/{1.73_m2} (ref >59)
GLOBULIN, TOTAL: 2.8 g/dL (ref 1.5–4.5)
Glucose: 90 mg/dL (ref 65–99)
POTASSIUM: 4.9 mmol/L (ref 3.5–5.2)
SODIUM: 140 mmol/L (ref 134–144)
Total Protein: 7.2 g/dL (ref 6.0–8.5)

## 2017-10-16 LAB — HEMOGLOBIN A1C
ESTIMATED AVERAGE GLUCOSE: 137 mg/dL
HEMOGLOBIN A1C: 6.4 % — AB (ref 4.8–5.6)

## 2017-10-16 LAB — LIPID PANEL
CHOLESTEROL TOTAL: 122 mg/dL (ref 100–199)
Chol/HDL Ratio: 2.3 ratio (ref 0.0–5.0)
HDL: 54 mg/dL (ref >39)
LDL Calculated: 48 mg/dL (ref 0–99)
Triglycerides: 101 mg/dL (ref 0–149)
VLDL Cholesterol Cal: 20 mg/dL (ref 5–40)

## 2017-10-17 DIAGNOSIS — Z23 Encounter for immunization: Secondary | ICD-10-CM | POA: Diagnosis not present

## 2017-12-03 DIAGNOSIS — H5111 Convergence insufficiency: Secondary | ICD-10-CM | POA: Diagnosis not present

## 2017-12-05 ENCOUNTER — Encounter: Payer: Self-pay | Admitting: Neurology

## 2017-12-05 ENCOUNTER — Ambulatory Visit (INDEPENDENT_AMBULATORY_CARE_PROVIDER_SITE_OTHER): Payer: Medicare Other | Admitting: Neurology

## 2017-12-05 VITALS — BP 128/79 | HR 88 | Ht 71.0 in | Wt 226.0 lb

## 2017-12-05 DIAGNOSIS — G20 Parkinson's disease: Secondary | ICD-10-CM

## 2017-12-05 NOTE — Progress Notes (Signed)
Subjective:    Patient ID: Danny [name] is a 66 y.o. male.  HPI     Interim history:   [name] is a very pleasant 66 year old right-handed gentleman with an underlying medical history of type 2 diabetes, osteoarthritis, hypertension, obesity, hyperlipidemia, and cataracts, who presents for follow-up consultation of his Parkinson's disease, with history of RBD. The patient is accompanied by his wife today. I last saw him on 09/09/2017 at which time he had been more sleepy during the day. His wife was worried that he was more preoccupied with sex. He had ongoing issues with intermittent double vision. He had intermittent and mild dream enactments. Suggested we gradually start him on low-dose Sinemet starting with half pill and gradually increasing this to 1 pill 3 times a day. His wife called in the interim in October reporting that he had done fairly well with Sinemet, at which time we reduced his Mirapex from 1 mg 3 times a day to 0.75 mg 3 times a day.   Today, 12/05/2017 (all dictated new, as well as above notes, some dictation done in note pad or Word, outside of chart, may appear as copied):    He reports doing okay on C/L, and reducing the mirapex was okay. No recent issues with reducing the Mirapex from 1 mg 3 times a day to 0.75 mg 3 times a day. Doing well with his Sinemet. He tries to stay active. He does not always exercise at the gym. Unfortunately he can no longer go through [employer] with his wife's membership. He tries to hydrate well. Thankfully, no recent falls.   The patient's allergies, current medications, family history, past medical history, past social history, past surgical history and problem list were reviewed and updated as appropriate.    Previously (copied from previous notes for reference):   I saw him on 03/04/2017, at which time he felt stable. He retired in December 2017. The daughter was living with them. Mother-in-law had moved in as well. His wife  retired also. He was going to the gym on a regular basis. Memory-wise, he had mild forgetfulness, no mood issues, no sleep issues, no side effects from the Mirapex. He had no recent falls thankfully. He had some mild dream enactments but no major issues. Looking back, his symptoms of peak date back to 2014, he had a sleep study in early 2014 with PLMS noted but no OSA and he denies restless leg symptoms. We mutually agreed to taper him off of Azilect as it was too expensive. I suggested in lieu of the Azilect, we increase his pramipexole to 1 mg 3 times a day.     I saw him on 09/03/2016, at which time he reported doing well, no telltale differences after increasing the Mirapex. He gave up driving. He decided to retire by the end of December 2017. His mother-in-law was supposed to move in with them. His memory was stable, sleep was stable, no real mood issues, no recent falls. I suggested we keep his medications the same including Azilect 1 mg once daily, Mirapex 0.75 mg 3 times a day.   I saw him on 02/27/2016, at which time he reported more difficulty with his walking, he had become slower. Wife had noted that it would take him longer to do things. He was dragging his feet at times. He was not swinging his arms as well. He was working full-time as a Land, avoiding stairs and heights. His A1c in December 2016 was  a little up at 7.6. Memory was stable, with some mild forgetfulness. Mood was stable, RBD was infrequent. He reported no recent falls. I suggested he continue with Azilect and we increased his Mirapex to 0.75 mg 3 times a day.    I saw him on 08/29/2015 at which time he reported doing fairly well. He was able to tolerate Azilect. His Mirapex was 0.5 mg 3 times a day. He was working at the computer most of the time. He was not always drinking enough water. He had a good appetite. He felt his memory was stable. RBD was less frequent but still occurring. His wife was able to sleep in the  same bed with him. He had no recent falls. He had some recent blurry vision was going to see his ophthalmologist. We mutually agreed to continue his current medication regimen.   I saw him on 04/25/2015, at which time he reported doing fairly well overall, but had noted smaller changes, including more slowness, difficulty with climbing ladders. He was advised by his boss to not climb ladders or walk over obstacles at work. Thankfully, his work environment is understanding. He had another business trip coming up and a colleague was to drive and do the climbing on the sites. He reported mild forgetfulness and some problems with depth perception. His diplopia had improved since he was given prism glasses by his ophthalmologist. I suggested, we try him on Azilect and keep his Mirapex the same.    I saw him on 02/04/2015, at which time he reported ongoing issues with diplopia, particularly at night while driving. He was still working full-time. He was still on low-dose Mirapex, 0.25 mg 3 times a day. He had no side effects. He had had a sleep study which per his report did not show any OSA. I asked him to make an appointment with his eye doctor. I suggested an increase in Mirapex to 0.5 mg 3 times a day.   I first met him on 12/28/2014, at which time he reported that he was having more fatigue. However, he also had reduce his caffeine intake. He had no recent falls and mood stable. He was working full-time. He reported that he had intermittent double vision in the last 3 months. His PCP requested a sooner than scheduled appointment for diplopia. His exam was stable at the time and I suggested no new medication changes.   He has had some short-term memory issues. He works full-time. He is an Chief Financial Officer. He likes square dance but his wife has noted that he has had more problems with his nighttime driving and his squared hands. In the recent 3 months he has had some intermittent double vision at night. He has drooling  at night. He rarely drinks alcohol and usually drinks 4-5 cups of coffee per day, occasional sodas. He quit smoking in 1978. Blood pressure is low for him today. It was recently noted to be trending lower and his blood pressure medication was reduced. He takes his cholesterol medication every other day.   He had a brain MRI without contrast on 11/04/2013: Normal MRI scan of the brain. Incidental findings of a chronic paranasal sinusitis with deviated nasal septum to the left.  He previously followed with Dr. Jim Like and was last seen by him on 08/30/2014, at which time his Mirapex was kept at 0.25 mg 3 times a day. He reported no side effects, in particular no impulse control disorder. I reviewed Dr. Hazle Quant notes. He first met  Dr. Janann Colonel on 10/26/13, at which time the patient reported a right hand tremor noticed over the previous 4-6 months. He had changes in his handwriting, slowness, some stiffness and changes in his walking.  His wife reported a longer standing history of REM behavior disorder. He has been in outpatient physical therapy.  His Past Medical History Is Significant For: Past Medical History:  Diagnosis Date  . Cataract   . Essential hypertension, benign   . Mixed hyperlipidemia   . OA (osteoarthritis) of knee    right  . Obesity, unspecified   . Parkinsons ([place])   . Type II or unspecified type diabetes mellitus without mention of complication, not stated as uncontrolled     His Past Surgical History Is Significant For: Past Surgical History:  Procedure Laterality Date  . GANGLION CYST EXCISION  age 6 years   right  . KNEE ARTHROSCOPY     right  . WRIST SURGERY  04/2012    His Family History Is Significant For: Family History  Problem Relation Age of Onset  . Diabetes Sister   . Diabetes Daughter   . Cancer Father   . Heart disease Father   . Diabetes Mother     His Social History Is Significant For: Social History   Socioeconomic History  . Marital  status: Married    Spouse name: [name]  . Number of children: 3  . Years of education: 49  . Highest education level: None  Social Needs  . Financial resource strain: None  . Food insecurity - worry: None  . Food insecurity - inability: None  . Transportation needs - medical: None  . Transportation needs - non-medical: None  Occupational History  . Occupation: Chief Financial Officer  Tobacco Use  . Smoking status: Former Smoker    Packs/day: 1.50    Years: 15.00    Pack years: 22.50    Types: Cigarettes    Last attempt to quit: 12/17/1982    Years since quitting: 34.9  . Smokeless tobacco: Never Used  . Tobacco comment: over 40 yrs  Substance and Sexual Activity  . Alcohol use: No    Comment: very rare  . Drug use: No  . Sexual activity: Yes    Partners: Female  Other Topics Concern  . None  Social History Narrative   Research officer, trade union, motorcycle rider.  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  He has 3 adult children.  His wife had 2 children, one of whom is deceased.   Patient is working full-time.   Patient has a Bachelor's degree.   Patient is right-handed.   Patient drinks 6 cups of coffee daily.    His Allergies Are:  Allergies  Allergen Reactions  . Codeine Nausea And Vomiting  :   His Current Medications Are:  Outpatient Encounter Medications as of 12/05/2017  Medication Sig  . aspirin 81 MG tablet Take 81 mg by mouth daily.    . carbidopa-levodopa (SINEMET IR) 25-100 MG tablet Take 1/2 pill twice daily x 1 week, then 1/2 pill 3 times a day x 1 week, then 1 pill 3 times a day thereafter.  . Cinnamon 500 MG capsule Take 500 mg by mouth daily.  . enalapril (VASOTEC) 2.5 MG tablet Take 1 tablet by mouth  daily (Patient taking differently: Take 2.5 mg by mouth every other day. Take 1 tablet by mouth  daily)  . Ginger, Zingiber officinalis, 550 MG CAPS Take 550 mg by mouth daily.    Marland Kitchen glipiZIDE (GLUCOTROL  XL) 10 MG 24 hr tablet TAKE 1 TABLET BY MOUTH DAILY  . Glucosamine-Chondroit-Vit C-Mn  (GLUCOSAMINE CHONDR 1500 COMPLX PO) Take 1,500 mg by mouth daily.    Marland Kitchen glucose blood test strip Use as instructed  . Multiple Vitamin (MULTIVITAMIN) tablet Take 1 tablet by mouth daily.  . pramipexole (MIRAPEX) 0.75 MG tablet Take 1 tablet (0.75 mg total) by mouth 3 (three) times daily.  . ranitidine (ZANTAC) 150 MG tablet Take 1 tablet (150 mg total) by mouth 2 (two) times daily.  . simvastatin (ZOCOR) 40 MG tablet Take 0.5 tablets (20 mg total) by mouth every other day.  . sitaGLIPtin-metformin (JANUMET) 50-1000 MG tablet Take 1 tablet by mouth two  times daily with meals   No facility-administered encounter medications on file as of 12/05/2017.   :  Review of Systems:  Out of a complete 14 point review of systems, all are reviewed and negative with the exception of these symptoms as listed below: Review of Systems  Neurological:       Pt presents today to discuss his PD. Pt denies any recent falls.    Objective:  Neurological Exam  Physical Exam Physical Examination:   Vitals:   12/05/17 1143  BP: 128/79  Pulse: 88   General Examination: The patient is a very pleasant 66 y.o. male in no acute distress. He appears well-developed and well-nourished and well groomed. Good spirits.  HEENT:Normocephalic, atraumatic, pupils are equal, round and reactive to light and accommodation. Extraocular tracking shows mild saccadic breakdown, no nystagmus, no significant limitation to downgaze, slight limitation to upper gaze. He has mild decrease in eye blink rate, status post bilateral cataract repairs, hearing is grossly intact, bilateral hearing aids in place. Prism eye glasses. Face is mildly masked, neck is mild to moderately rigid, airway examination reveals mild mouth dryness, no significant airway crowding, Mallampati class II, no sialorrhea. Tongue protrudes centrally and palate elevates symmetrically. Mild hypophonia, no dysarthria noted.  Chest:Clear to auscultation without  wheezing, rhonchi or crackles noted.  Heart:S1+S2+0, regular and normal without murmurs, rubs or gallops noted.   Abdomen:Soft, non-tender and non-distended with normal bowel sounds appreciated on auscultation.  Extremities:There is nopitting edema in the distal lower extremities bilaterally. Pedal pulses are intact.  Skin: Warm and dry without trophic changes noted.  Musculoskeletal: exam reveals no obvious joint deformities, tenderness or joint swelling or erythema.   Neurologically:  Mental status: The patient is awake, alert and oriented in all 4 spheres. Hisimmediate and remote memory, attention, language skills and fund of knowledge are appropriate. There is no evidence of aphasia, agnosia, apraxia or anomia. Speech is clear with normal prosody and enunciation. Mild hypophonia, thought process is linear. Mood is normaland affect is normal.  Cranial nerves II - XII are as described above under HEENT exam. In addition: shoulder shrug is normal with equal shoulder height noted. Motor exam: Normal bulk, andstrengthis noted. He has mild increase in tone on the right side. Overall mild bradykinesia is noted, no consistent resting tremor, a slight intermittent resting tremor is noted in the right upper extremity only, no other resting tremor, all findings stable. Fine motor skills are mild to moderately impaired on the right and mildly impaired on the left, fairly stable. Sensory exam is intact to light touch. Cerebellar testing shows no dysmetria or intention tremor, no ataxia. Gait, station and balance: [place] with mild difficulty, he does push himself up with his hands. He has a mild lean to the left, mild to moderately  stooped posture, slightly better than last time. Stance is naturally slightly wide-based. He walks with decreased arm swing on the right. Balance is mildly impaired, but stable.  Assessment and plan:   In summary, Danny [name] a very pleasant 55-year  old malewith anunderlying medical history of type 2 diabetes, osteoarthritis, hypertension, obesity, hyperlipidemia, cataracts with status post surgeries, who presents for follow-up consultation of his right-sided predominant Parkinson's disease, complicated by mild RBD in the past, some daytime sleepiness. He is getting no eye glasses. Mood is stable, memory is stable. He was on Azilect in the past but we tapered this off in March 2018 due to cost. He was on Mirapex 1 mg 3 times a day but he had some possible side effects with this. We reduced this to 0.75 mg more recently. We also started him about 3 months ago on Sinemet low dose. He is able to take one pill 3 times a day and feels it has helped. Able to tolerate this. We will continue with the current regimen which is Sinemet 25-100 milligrams strength one pill 3 times a day as well as Mirapex 0.75 mg strength one pill 3 times a day. I asked him to be more active physically and incorporates some form of exercise regimen to his day-to-day schedule. He is advised to stay well-hydrated. We talked about fall risk. Since he is doing quite well at this time I suggested a 6 month follow-up, sooner as needed. I answered all her questions today and the patient and his wife were in agreement. I spent 25 minutes in total face-to-face time with the patient, more than 50% of which was spent in counseling and coordination of care, reviewing test results, reviewing medication and discussing or reviewing the diagnosis of PD, its prognosis and treatment options. Pertinent laboratory and imaging test results that were available during this visit with the patient were reviewed by me and considered in my medical decision making (see chart for details).

## 2017-12-05 NOTE — Patient Instructions (Addendum)
We will continue with your 2 parkinson's medications at the current doses.   We will do a 6 month follow.

## 2018-01-06 ENCOUNTER — Encounter: Payer: Self-pay | Admitting: Neurology

## 2018-01-17 ENCOUNTER — Encounter: Payer: Self-pay | Admitting: Physician Assistant

## 2018-01-17 ENCOUNTER — Ambulatory Visit (INDEPENDENT_AMBULATORY_CARE_PROVIDER_SITE_OTHER): Payer: Medicare Other | Admitting: Physician Assistant

## 2018-01-17 ENCOUNTER — Ambulatory Visit (INDEPENDENT_AMBULATORY_CARE_PROVIDER_SITE_OTHER): Payer: Medicare Other

## 2018-01-17 ENCOUNTER — Ambulatory Visit: Payer: Medicare Other | Admitting: Physician Assistant

## 2018-01-17 VITALS — BP 108/70 | HR 92 | Temp 98.0°F | Ht 71.0 in | Wt 226.0 lb

## 2018-01-17 VITALS — BP 108/70 | HR 92 | Temp 98.0°F | Resp 18 | Ht 71.0 in | Wt 226.0 lb

## 2018-01-17 DIAGNOSIS — Z23 Encounter for immunization: Secondary | ICD-10-CM | POA: Diagnosis not present

## 2018-01-17 DIAGNOSIS — Z0000 Encounter for general adult medical examination without abnormal findings: Secondary | ICD-10-CM | POA: Diagnosis not present

## 2018-01-17 DIAGNOSIS — E119 Type 2 diabetes mellitus without complications: Secondary | ICD-10-CM | POA: Diagnosis not present

## 2018-01-17 DIAGNOSIS — E782 Mixed hyperlipidemia: Secondary | ICD-10-CM | POA: Diagnosis not present

## 2018-01-17 DIAGNOSIS — I10 Essential (primary) hypertension: Secondary | ICD-10-CM

## 2018-01-17 NOTE — Assessment & Plan Note (Signed)
Controlled.  Continue to monitor.  

## 2018-01-17 NOTE — Progress Notes (Signed)
Patient ID: Danny [name], male    DOB: 1951/01/22, 67 y.o.   MRN: [id number]  PCP: [name], PA-C  Chief Complaint  Patient presents with  . Diabetes  . Hyperlipidemia  . Hypertension  . Follow-up    Subjective:   Presents for evaluation of Diabetes, HTN and hyperlipidemia. He is accompanied by his wife, [name], as usual.  Overall, is doing well, and he has no specific problems or complaints. A1C 10/15/2017 6.4%. LDL well controlled (48 last check) for at least the past year.  Taking his medications as directed. HTN has been well controlled, such that we've reduced the enalapril to QOD to prevent hypotension. No adverse effects.  Parkinson's managed by neurology. Per his wife, he's shuffling more, and he's not remembering the square dance call patterns. Will need to do only "singing" calls going forward.  Looking for Shingrix vaccine. Their pharmacy hasn't been able to get it in stock.  "We have a lot going on." His mother is in the hospital, pulmonary effusion. Grandson needs to be picked up at [facility] [employer] today by 3 pm (he has graduated as a Manufacturing engineer and has to leave base until he is processed). Hosting a Superbowl party at their home this weekend. [name] is going to skydive (tandem) for her 70th birthday, but his neurologist has not agreed to allow him to participate.   Review of Systems  Constitutional: Negative for activity change, appetite change, fatigue and unexpected weight change.  HENT: Negative for congestion, dental problem, ear pain, hearing loss, mouth sores, postnasal drip, rhinorrhea, sneezing, sore throat, tinnitus and trouble swallowing.   Eyes: Negative for photophobia, pain, redness and visual disturbance.  Respiratory: Negative for cough, chest tightness and shortness of breath.   Cardiovascular: Negative for chest pain, palpitations and leg swelling.  Gastrointestinal: Negative for abdominal pain, blood in stool, constipation, diarrhea,  nausea and vomiting.  Endocrine: Negative for cold intolerance, heat intolerance, polydipsia, polyphagia and polyuria.  Genitourinary: Negative for dysuria, frequency, hematuria and urgency.  Musculoskeletal: Negative for arthralgias, gait problem (increased shuffling), myalgias and neck stiffness.  Skin: Negative for rash.  Neurological: Negative for dizziness, speech difficulty, weakness, light-headedness, numbness and headaches.       Memory loss, short term  Hematological: Negative for adenopathy.  Psychiatric/Behavioral: Negative for confusion and sleep disturbance. The patient is not nervous/anxious.        Patient Active Problem List   Diagnosis Date Noted  . Hematospermia 11/18/2015  . Epididymal cyst 05/25/2015  . Parkinson disease ([place]) 01/14/2014  . Obesity (BMI 30-39.9) 07/14/2013  . Confusional arousals 02/13/2013  . Disorder of ligament of right wrist 01/29/2012  . Diabetes mellitus type 2, uncomplicated ([place]) 13/24/4010  . Mixed hyperlipidemia 10/09/2011  . OA (osteoarthritis) of knee 10/09/2011  . Essential hypertension, benign 10/09/2011     Prior to Admission medications   Medication Sig Start Date End Date Taking? Authorizing Provider  aspirin 81 MG tablet Take 81 mg by mouth daily.     Yes [provider]  carbidopa-levodopa (SINEMET IR) 25-100 MG tablet Take 1/2 pill twice daily x 1 week, then 1/2 pill 3 times a day x 1 week, then 1 pill 3 times a day thereafter. 09/09/17  Yes [name], MD  Cinnamon 500 MG capsule Take 500 mg by mouth daily.   Yes [provider]  enalapril (VASOTEC) 2.5 MG tablet Take 1 tablet by mouth  daily Patient taking differently: Take 2.5 mg by mouth every other  day. Take 1 tablet by mouth  daily 05/08/16  Yes Wardell Honour, MD  Ginger, Zingiber officinalis, 550 MG CAPS Take 550 mg by mouth daily.     Yes [provider]  glipiZIDE (GLUCOTROL XL) 10 MG 24 hr tablet TAKE 1 TABLET BY MOUTH DAILY 10/15/17   Yes Ulis Kaps, PA-C  Glucosamine-Chondroit-Vit C-Mn (GLUCOSAMINE CHONDR 1500 COMPLX PO) Take 1,500 mg by mouth daily.     Yes [provider]  glucose blood test strip Use as instructed 02/04/17  Yes Intisar Claudio, PA-C  Multiple Vitamin (MULTIVITAMIN) tablet Take 1 tablet by mouth daily.   Yes [provider]  pramipexole (MIRAPEX) 0.75 MG tablet Take 1 tablet (0.75 mg total) by mouth 3 (three) times daily. 10/15/17  Yes [name], MD  ranitidine (ZANTAC) 150 MG tablet Take 1 tablet (150 mg total) by mouth 2 (two) times daily. 07/16/17  Yes Angelica Frandsen, PA-C  simvastatin (ZOCOR) 40 MG tablet Take 0.5 tablets (20 mg total) by mouth every other day. 10/15/17  Yes Toula Miyasaki, PA-C  sitaGLIPtin-metformin (JANUMET) 50-1000 MG tablet Take 1 tablet by mouth two  times daily with meals 10/15/17  Yes Ikhlas Albo, PA-C     Allergies  Allergen Reactions  . Codeine Nausea And Vomiting       Objective:  Physical Exam  Constitutional: He is oriented to person, place, and time. He appears well-developed and well-nourished. He is active and cooperative. No distress.  BP 108/70 (BP Location: Right Arm, Patient Position: Sitting, Cuff Size: Normal)   Pulse 92   Temp 98 F (36.7 C) (Oral)   Resp 18   Ht 5\' 11"  (1.803 m)   Wt 226 lb (102.5 kg)   SpO2 95%   BMI 31.52 kg/m   HENT:  Head: Normocephalic and atraumatic.  Right Ear: Hearing normal.  Left Ear: Hearing normal.  Eyes: Conjunctivae are normal. No scleral icterus.  Neck: Normal range of motion. Neck supple. No thyromegaly present.  Cardiovascular: Normal rate, regular rhythm and normal heart sounds.  Pulses:      Radial pulses are 2+ on the right side, and 2+ on the left side.  Pulmonary/Chest: Effort normal and breath sounds normal.  Lymphadenopathy:       Head (right side): No tonsillar, no preauricular, no posterior auricular and no occipital adenopathy present.       Head (left side): No  tonsillar, no preauricular, no posterior auricular and no occipital adenopathy present.    He has no cervical adenopathy.       Right: No supraclavicular adenopathy present.       Left: No supraclavicular adenopathy present.  Neurological: He is alert and oriented to person, place, and time. No sensory deficit.  Skin: Skin is warm, dry and intact. No rash noted. No cyanosis or erythema. Nails show no clubbing.  Psychiatric: He has a normal mood and affect. His speech is normal and behavior is normal.           Assessment & Plan:   Problem List Items Addressed This Visit    Diabetes mellitus type 2, uncomplicated ([place]) - Primary (Chronic)    Has been controlled. Anticipate same today. Continue current treatment and monitoring, especially as he becomes less active. Encouraged continued square dancing and consideration of boxing class.      Relevant Orders   Comprehensive metabolic panel   Hemoglobin A1c   Mixed hyperlipidemia (Chronic)    LDL has been well controlled. Continue current treatment.  Relevant Orders   Comprehensive metabolic panel   Lipid panel   Essential hypertension, benign (Chronic)    Controlled. Continue to monitor.      Relevant Orders   CBC with Differential/Platelet   Comprehensive metabolic panel    Other Visit Diagnoses    Need for pneumococcal vaccination       Relevant Orders   Pneumococcal polysaccharide vaccine 23-valent greater than or equal to 2yo subcutaneous/IM (Completed)       Return in about 3 months (around 04/16/2018) for re-evalaution of diabetes, blood pressure, cholesterol.   [name], PA-C [facility]

## 2018-01-17 NOTE — Assessment & Plan Note (Signed)
Has been controlled. Anticipate same today. Continue current treatment and monitoring, especially as he becomes less active. Encouraged continued square dancing and consideration of boxing class.

## 2018-01-17 NOTE — Patient Instructions (Addendum)
Danny [name] , Thank you for taking time to come for your Medicare Wellness Visit. I appreciate your ongoing commitment to your health goals. Please review the following plan we discussed and let me know if I can assist you in the future.   Screening recommendations/referrals: Colonoscopy: up to date, next due 08/30/2019 Recommended yearly ophthalmology/optometry visit for glaucoma screening and checkup Recommended yearly dental visit for hygiene and checkup  Vaccinations: Influenza vaccine: up to date  Pneumococcal vaccine: up to date Tdap vaccine: declined due to insurance  Shingles vaccine: Check with your pharmacy about receiving the Shingrix vaccine     Advanced directives: Please bring a copy of your POA (Power of Attorney) and/or Living Will to your next appointment.   Conditions/risks identified: Try to start back exercising more on a consistent basis.  Next appointment: today @ 9:20 am with Danny [name], next Medicare Wellness visit is 01/20/2019 @ 8:40 am with Danny [name] 65 Years and Older, Male Preventive care refers to lifestyle choices and visits with your health care provider that can promote health and wellness. What does preventive care include?  A yearly physical exam. This is also called an annual well check.  Dental exams once or twice a year.  Routine eye exams. Ask your health care provider how often you should have your eyes checked.  Personal lifestyle choices, including:  Daily care of your teeth and gums.  Regular physical activity.  Eating a healthy diet.  Avoiding tobacco and drug use.  Limiting alcohol use.  Practicing safe sex.  Taking low doses of aspirin every day.  Taking vitamin and mineral supplements as recommended by your health care provider. What happens during an annual well check? The services and screenings done by your health care provider during your annual well check will depend on your age, overall  health, lifestyle risk factors, and family history of disease. Counseling  Your health care provider may ask you questions about your:  Alcohol use.  Tobacco use.  Drug use.  Emotional well-being.  Home and relationship well-being.  Sexual activity.  Eating habits.  History of falls.  Memory and ability to understand (cognition).  Work and work Statistician. Screening  You may have the following tests or measurements:  Height, weight, and BMI.  Blood pressure.  Lipid and cholesterol levels. These may be checked every 5 years, or more frequently if you are over 70 years old.  Skin check.  Lung cancer screening. You may have this screening every year starting at age 56 if you have a 30-pack-year history of smoking and currently smoke or have quit within the past 15 years.  Fecal occult blood test (FOBT) of the stool. You may have this test every year starting at age 72.  Flexible sigmoidoscopy or colonoscopy. You may have a sigmoidoscopy every 5 years or a colonoscopy every 10 years starting at age 59.  Prostate cancer screening. Recommendations will vary depending on your family history and other risks.  Hepatitis C blood test.  Hepatitis B blood test.  Sexually transmitted disease (STD) testing.  Diabetes screening. This is done by checking your blood sugar (glucose) after you have not eaten for a while (fasting). You may have this done every 1-3 years.  Abdominal aortic aneurysm (AAA) screening. You may need this if you are a current or former smoker.  Osteoporosis. You may be screened starting at age 66 if you are at high risk. Talk with your health care provider about  your test results, treatment options, and if necessary, the need for more tests. Vaccines  Your health care provider may recommend certain vaccines, such as:  Influenza vaccine. This is recommended every year.  Tetanus, diphtheria, and acellular pertussis (Tdap, Td) vaccine. You may need a Td  booster every 10 years.  Zoster vaccine. You may need this after age 24.  Pneumococcal 13-valent conjugate (PCV13) vaccine. One dose is recommended after age 72.  Pneumococcal polysaccharide (PPSV23) vaccine. One dose is recommended after age 14. Talk to your health care provider about which screenings and vaccines you need and how often you need them. This information is not intended to replace advice given to you by your health care provider. Make sure you discuss any questions you have with your health care provider. Document Released: 12/30/2015 Document Revised: 08/22/2016 Document Reviewed: 10/04/2015 Elsevier Interactive Patient Education  2017 [place] Prevention in the Home Falls can cause injuries. They can happen to people of all ages. There are many things you can do to make your home safe and to help prevent falls. What can I do on the outside of my home?  Regularly fix the edges of walkways and driveways and fix any cracks.  Remove anything that might make you trip as you walk through a door, such as a raised step or threshold.  Trim any bushes or trees on the path to your home.  Use bright outdoor lighting.  Clear any walking paths of anything that might make someone trip, such as rocks or tools.  Regularly check to see if handrails are loose or broken. Make sure that both sides of any steps have handrails.  Any raised decks and porches should have guardrails on the edges.  Have any leaves, snow, or ice cleared regularly.  Use sand or salt on walking paths during winter.  Clean up any spills in your garage right away. This includes oil or grease spills. What can I do in the bathroom?  Use night lights.  Install grab bars by the toilet and in the tub and shower. Do not use towel bars as grab bars.  Use non-skid mats or decals in the tub or shower.  If you need to sit down in the shower, use a plastic, non-slip stool.  Keep the floor dry. Clean up  any water that spills on the floor as soon as it happens.  Remove soap buildup in the tub or shower regularly.  Attach bath mats securely with double-sided non-slip rug tape.  Do not have throw rugs and other things on the floor that can make you trip. What can I do in the bedroom?  Use night lights.  Make sure that you have a light by your bed that is easy to reach.  Do not use any sheets or blankets that are too big for your bed. They should not hang down onto the floor.  Have a firm chair that has side arms. You can use this for support while you get dressed.  Do not have throw rugs and other things on the floor that can make you trip. What can I do in the kitchen?  Clean up any spills right away.  Avoid walking on wet floors.  Keep items that you use a lot in easy-to-reach places.  If you need to reach something above you, use a strong step stool that has a grab bar.  Keep electrical cords out of the way.  Do not use floor polish or wax  that makes floors slippery. If you must use wax, use non-skid floor wax.  Do not have throw rugs and other things on the floor that can make you trip. What can I do with my stairs?  Do not leave any items on the stairs.  Make sure that there are handrails on both sides of the stairs and use them. Fix handrails that are broken or loose. Make sure that handrails are as long as the stairways.  Check any carpeting to make sure that it is firmly attached to the stairs. Fix any carpet that is loose or worn.  Avoid having throw rugs at the top or bottom of the stairs. If you do have throw rugs, attach them to the floor with carpet tape.  Make sure that you have a light switch at the top of the stairs and the bottom of the stairs. If you do not have them, ask someone to add them for you. What else can I do to help prevent falls?  Wear shoes that:  Do not have high heels.  Have rubber bottoms.  Are comfortable and fit you well.  Are  closed at the toe. Do not wear sandals.  If you use a stepladder:  Make sure that it is fully opened. Do not climb a closed stepladder.  Make sure that both sides of the stepladder are locked into place.  Ask someone to hold it for you, if possible.  Clearly mark and make sure that you can see:  Any grab bars or handrails.  First and last steps.  Where the edge of each step is.  Use tools that help you move around (mobility aids) if they are needed. These include:  Canes.  Walkers.  Scooters.  Crutches.  Turn on the lights when you go into a dark area. Replace any light bulbs as soon as they burn out.  Set up your furniture so you have a clear path. Avoid moving your furniture around.  If any of your floors are uneven, fix them.  If there are any pets around you, be aware of where they are.  Review your medicines with your doctor. Some medicines can make you feel dizzy. This can increase your chance of falling. Ask your doctor what other things that you can do to help prevent falls. This information is not intended to replace advice given to you by your health care provider. Make sure you discuss any questions you have with your health care provider. Document Released: 09/29/2009 Document Revised: 05/10/2016 Document Reviewed: 01/07/2015 Elsevier Interactive Patient Education  2017 [employer].

## 2018-01-17 NOTE — Patient Instructions (Addendum)
Look in to a Parkinson's boxing class. Keep dancing! Let folks know that you need help, so that you can keep dancing as long as possible!    IF you received an x-ray today, you will receive an invoice from [facility]. Please contact [facility] at [phone] with questions or concerns regarding your invoice.   IF you received labwork today, you will receive an invoice from [facility]. Please contact [facility] at [phone] with questions or concerns regarding your invoice.   Our billing staff will not be able to assist you with questions regarding bills from these companies.  You will be contacted with the lab results as soon as they are available. The fastest way to get your results is to activate your My Chart account. Instructions are located on the last page of this paperwork. If you have not heard from [place] regarding the results in 2 weeks, please contact this office.

## 2018-01-17 NOTE — Progress Notes (Signed)
Subjective:   Danny [name] is a 67 y.o. male who presents for an Initial Medicare Annual Wellness Visit.  Review of Systems  N/A Cardiac Risk Factors include: advanced age (>87men, >60 women);diabetes mellitus;male gender;obesity (BMI >30kg/m2);dyslipidemia;hypertension    Objective:    Today's Vitals   01/17/18 0818  BP: 108/70  Pulse: 92  Temp: 98 F (36.7 C)  TempSrc: Oral  SpO2: 95%  Weight: 226 lb (102.5 kg)  Height: 5\' 11"  (1.803 m)   Body mass index is 31.52 kg/m.  Advanced Directives 01/17/2018 11/01/2014 08/30/2014  Does Patient Have a Medical Advance Directive? Yes No No  Type of Paramedic of [place];Living will - -  Copy of [place] in Chart? No - copy requested - -  Would patient like information on creating a medical advance directive? - Yes - Educational materials given -    Current Medications (verified) Outpatient Encounter Medications as of 01/17/2018  Medication Sig  . aspirin 81 MG tablet Take 81 mg by mouth daily.    . carbidopa-levodopa (SINEMET IR) 25-100 MG tablet Take 1/2 pill twice daily x 1 week, then 1/2 pill 3 times a day x 1 week, then 1 pill 3 times a day thereafter.  . Cinnamon 500 MG capsule Take 500 mg by mouth daily.  . enalapril (VASOTEC) 2.5 MG tablet Take 1 tablet by mouth  daily (Patient taking differently: Take 2.5 mg by mouth every other day. Take 1 tablet by mouth  daily)  . Ginger, Zingiber officinalis, 550 MG CAPS Take 550 mg by mouth daily.    Marland Kitchen glipiZIDE (GLUCOTROL XL) 10 MG 24 hr tablet TAKE 1 TABLET BY MOUTH DAILY  . Glucosamine-Chondroit-Vit C-Mn (GLUCOSAMINE CHONDR 1500 COMPLX PO) Take 1,500 mg by mouth daily.    Marland Kitchen glucose blood test strip Use as instructed  . Multiple Vitamin (MULTIVITAMIN) tablet Take 1 tablet by mouth daily.  . pramipexole (MIRAPEX) 0.75 MG tablet Take 1 tablet (0.75 mg total) by mouth 3 (three) times daily.  . ranitidine (ZANTAC) 150 MG tablet Take 1  tablet (150 mg total) by mouth 2 (two) times daily.  . simvastatin (ZOCOR) 40 MG tablet Take 0.5 tablets (20 mg total) by mouth every other day.  . sitaGLIPtin-metformin (JANUMET) 50-1000 MG tablet Take 1 tablet by mouth two  times daily with meals   No facility-administered encounter medications on file as of 01/17/2018.     Allergies (verified) Codeine   History: Past Medical History:  Diagnosis Date  . Cataract   . Essential hypertension, benign   . Mixed hyperlipidemia   . OA (osteoarthritis) of knee    right  . Obesity, unspecified   . Parkinsons ([place])   . Type II or unspecified type diabetes mellitus without mention of complication, not stated as uncontrolled    Past Surgical History:  Procedure Laterality Date  . GANGLION CYST EXCISION  age 9 years   right  . KNEE ARTHROSCOPY     right  . WRIST SURGERY  04/2012   Family History  Problem Relation Age of Onset  . Diabetes Sister   . Diabetes Daughter   . Cancer Father   . Heart disease Father   . Diabetes Mother    Social History   Socioeconomic History  . Marital status: Married    Spouse name: [name]  . Number of children: 3  . Years of education: 16  . Highest education level: Bachelor's degree (e.g., BA, AB, BS)  Social Needs  . Financial resource strain: Not hard at all  . Food insecurity - worry: Never true  . Food insecurity - inability: Never true  . Transportation needs - medical: No  . Transportation needs - non-medical: No  Occupational History  . Occupation: Chief Financial Officer  Tobacco Use  . Smoking status: Former Smoker    Packs/day: 1.50    Years: 15.00    Pack years: 22.50    Types: Cigarettes    Last attempt to quit: 12/17/1982    Years since quitting: 35.1  . Smokeless tobacco: Never Used  . Tobacco comment: over 40 yrs  Substance and Sexual Activity  . Alcohol use: No    Comment: very rare  . Drug use: No  . Sexual activity: Yes    Partners: Female  Other Topics Concern  . None  Social  History Narrative   Research officer, trade union, motorcycle rider.  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  He has 3 adult children.  His wife had 2 children, one of whom is deceased.   Patient is working full-time.   Patient has a Bachelor's degree.   Patient is right-handed.   Patient drinks 6 cups of coffee daily.   Tobacco Counseling Counseling given: Not Answered Comment: over 40 yrs   Clinical Intake:  Pre-visit preparation completed: Yes  Pain : No/denies pain     Nutritional Status: BMI > 30  Obese Nutritional Risks: Nausea/ vomitting/ diarrhea(Patient has nausea spells about once a month ) Diabetes: Yes CBG done?: No Did pt. bring in CBG monitor from home?: No  How often do you need to have someone help you when you read instructions, pamphlets, or other written materials from your doctor or pharmacy?: 1 - Never What is the last grade level you completed in school?: bachelors degree  Interpreter Needed?: No  Information entered by :: Andrez Grime, LPN  Activities of Daily Living In your present state of health, do you have any difficulty performing the following activities: 01/17/2018  Hearing? N  Vision? Y  Comment Patient has double vision issues  Difficulty concentrating or making decisions? N  Walking or climbing stairs? Y  Comment Patient has trouble climbing stairs, walks with a cane  Dressing or bathing? N  Doing errands, shopping? N  Preparing Food and eating ? N  Using the Toilet? N  In the past six months, have you accidently leaked urine? N  Do you have problems with loss of bowel control? N  Managing your Medications? N  Managing your Finances? N  Housekeeping or managing your Housekeeping? N  Some recent data might be hidden     Immunizations and Health Maintenance Immunization History  Administered Date(s) Administered  . Influenza Split 09/17/2011, 09/16/2012, 10/06/2013  . Influenza-Unspecified 12/18/2011, 10/04/2014, 09/17/2015, 09/28/2016  .  Pneumococcal Conjugate-13 01/10/2017  . Pneumococcal Polysaccharide-23 09/02/2007  . Tdap 11/25/2007  . Zoster 11/11/2013   Health Maintenance Due  Topic Date Due  . PNA vac Low Risk Adult (2 of 2 - PPSV23) 01/10/2018    Patient Care Team: [name], PA-C as PCP - General (Physician Assistant) [place], Heather, [place] (Optometry) [name], MD as Attending Physician (Neurology)  Indicate any recent Medical Services you may have received from other than [facility] providers in the past year (date may be approximate).    Assessment:   This is a routine wellness examination for Danny [name].  Hearing/Vision screen Hearing Screening Comments: Patient has had a hearing exam in the past.  Vision Screening Comments:  Patient sees Dr. Heather [place] for his routine eye exams once a year.   Dietary issues and exercise activities discussed: Current Exercise Habits: The patient does not participate in regular exercise at present(patient cannot exercise sue to bad vision and equlibrium is off), Exercise limited by: None identified  Goals    . Exercise 3x per week (30 min per time)     Patient wants to try to start back exercising more on a consistent basis.       Depression Screen PHQ 2/9 Scores 01/17/2018 10/15/2017 07/16/2017 04/09/2017  PHQ - 2 Score 0 0 0 0    Fall Risk Fall Risk  01/17/2018 10/15/2017 07/16/2017 04/09/2017 03/04/2017  Falls in the past year? No No No No No    Is the patient's home free of loose throw rugs in walkways, pet beds, electrical cords, etc?   yes      Grab bars in the bathroom? yes      Handrails on the stairs?   yes      Adequate lighting?   yes  Timed Get Up and Go performed: yes, completed within 30 seconds, Patient uses cane  Cognitive Function:     6CIT Screen 01/17/2018  What Year? 0 points  What month? 0 points  What time? 0 points  Count back from 20 0 points  Months in reverse 0 points  Repeat phrase 6 points  Total Score 6    Screening Tests Health  Maintenance  Topic Date Due  . PNA vac Low Risk Adult (2 of 2 - PPSV23) 01/10/2018  . TETANUS/TDAP  01/17/2019 (Originally 11/24/2017)  . OPHTHALMOLOGY EXAM  04/09/2018  . HEMOGLOBIN A1C  04/15/2018  . FOOT EXAM  07/16/2018  . COLONOSCOPY  08/30/2019  . INFLUENZA VACCINE  Completed  . Hepatitis C Screening  Completed    Qualifies for Shingles Vaccine? Advised patient to check with is pharmacy about receiving the Shingrix vaccine   Cancer Screenings: Lung: Low Dose CT Chest recommended if Age 55-80 years, 30 pack-year currently smoking OR have quit w/in 15years. Patient does not qualify. Colorectal: colonoscopy completed 08/29/2009  Additional Screenings:  Hepatitis B/HIV/Syphillis: not indicated  Hepatitis C Screening: completed 04/07/2013      Plan:   I have personally reviewed and noted the following in the patient's chart:   . Medical and social history . Use of alcohol, tobacco or illicit drugs  . Current medications and supplements . Functional ability and status . Nutritional status . Physical activity . Advanced directives . List of other physicians . Hospitalizations, surgeries, and ER visits in previous 12 months . Vitals . Screenings to include cognitive, depression, and falls . Referrals and appointments  In addition, I have reviewed and discussed with patient certain preventive protocols, quality metrics, and best practice recommendations. A written personalized care plan for preventive services as well as general preventive health recommendations were provided to patient.  Jerl Santos, LPN   01/21/4269

## 2018-01-17 NOTE — Assessment & Plan Note (Signed)
LDL has been well controlled. Continue current treatment.

## 2018-01-18 LAB — CBC WITH DIFFERENTIAL/PLATELET
BASOS ABS: 0 10*3/uL (ref 0.0–0.2)
Basos: 0 %
EOS (ABSOLUTE): 0.1 10*3/uL (ref 0.0–0.4)
Eos: 1 %
Hematocrit: 43.9 % (ref 37.5–51.0)
Hemoglobin: 14.7 g/dL (ref 13.0–17.7)
Immature Grans (Abs): 0 10*3/uL (ref 0.0–0.1)
Immature Granulocytes: 0 %
LYMPHS ABS: 1.7 10*3/uL (ref 0.7–3.1)
Lymphs: 19 %
MCH: 31.1 pg (ref 26.6–33.0)
MCHC: 33.5 g/dL (ref 31.5–35.7)
MCV: 93 fL (ref 79–97)
Monocytes Absolute: 0.6 10*3/uL (ref 0.1–0.9)
Monocytes: 7 %
Neutrophils Absolute: 6.7 10*3/uL (ref 1.4–7.0)
Neutrophils: 73 %
Platelets: 221 10*3/uL (ref 150–379)
RBC: 4.72 x10E6/uL (ref 4.14–5.80)
RDW: 14.4 % (ref 12.3–15.4)
WBC: 9.2 10*3/uL (ref 3.4–10.8)

## 2018-01-18 LAB — COMPREHENSIVE METABOLIC PANEL
ALBUMIN: 4.5 g/dL (ref 3.6–4.8)
ALK PHOS: 65 IU/L (ref 39–117)
ALT: 15 IU/L (ref 0–44)
AST: 16 IU/L (ref 0–40)
Albumin/Globulin Ratio: 1.6 (ref 1.2–2.2)
BILIRUBIN TOTAL: 0.9 mg/dL (ref 0.0–1.2)
BUN/Creatinine Ratio: 19 (ref 10–24)
BUN: 22 mg/dL (ref 8–27)
CO2: 22 mmol/L (ref 20–29)
CREATININE: 1.17 mg/dL (ref 0.76–1.27)
Calcium: 10.5 mg/dL — ABNORMAL HIGH (ref 8.6–10.2)
Chloride: 101 mmol/L (ref 96–106)
GFR calc Af Amer: 75 mL/min/{1.73_m2} (ref >59)
GFR calc non Af Amer: 65 mL/min/{1.73_m2} (ref >59)
GLOBULIN, TOTAL: 2.9 g/dL (ref 1.5–4.5)
Glucose: 123 mg/dL — ABNORMAL HIGH (ref 65–99)
Potassium: 5.3 mmol/L — ABNORMAL HIGH (ref 3.5–5.2)
Sodium: 138 mmol/L (ref 134–144)
Total Protein: 7.4 g/dL (ref 6.0–8.5)

## 2018-01-18 LAB — LIPID PANEL
CHOL/HDL RATIO: 2.2 ratio (ref 0.0–5.0)
Cholesterol, Total: 123 mg/dL (ref 100–199)
HDL: 55 mg/dL (ref >39)
LDL Calculated: 52 mg/dL (ref 0–99)
Triglycerides: 80 mg/dL (ref 0–149)
VLDL CHOLESTEROL CAL: 16 mg/dL (ref 5–40)

## 2018-01-18 LAB — HEMOGLOBIN A1C
Est. average glucose Bld gHb Est-mCnc: 154 mg/dL
HEMOGLOBIN A1C: 7 % — AB (ref 4.8–5.6)

## 2018-02-05 ENCOUNTER — Other Ambulatory Visit: Payer: Self-pay | Admitting: Neurology

## 2018-02-06 ENCOUNTER — Other Ambulatory Visit: Payer: Self-pay | Admitting: Physician Assistant

## 2018-03-24 ENCOUNTER — Encounter: Payer: Self-pay | Admitting: Physician Assistant

## 2018-04-16 ENCOUNTER — Ambulatory Visit (INDEPENDENT_AMBULATORY_CARE_PROVIDER_SITE_OTHER): Payer: Medicare Other

## 2018-04-16 ENCOUNTER — Ambulatory Visit (INDEPENDENT_AMBULATORY_CARE_PROVIDER_SITE_OTHER): Payer: Medicare Other | Admitting: Physician Assistant

## 2018-04-16 ENCOUNTER — Encounter: Payer: Self-pay | Admitting: Physician Assistant

## 2018-04-16 ENCOUNTER — Other Ambulatory Visit: Payer: Self-pay

## 2018-04-16 VITALS — BP 118/72 | HR 80 | Temp 98.0°F | Resp 16 | Ht 70.87 in | Wt 222.0 lb

## 2018-04-16 DIAGNOSIS — E782 Mixed hyperlipidemia: Secondary | ICD-10-CM | POA: Diagnosis not present

## 2018-04-16 DIAGNOSIS — M25512 Pain in left shoulder: Secondary | ICD-10-CM

## 2018-04-16 DIAGNOSIS — E119 Type 2 diabetes mellitus without complications: Secondary | ICD-10-CM | POA: Diagnosis not present

## 2018-04-16 DIAGNOSIS — G8929 Other chronic pain: Secondary | ICD-10-CM | POA: Diagnosis not present

## 2018-04-16 DIAGNOSIS — M25561 Pain in right knee: Secondary | ICD-10-CM | POA: Diagnosis not present

## 2018-04-16 DIAGNOSIS — I10 Essential (primary) hypertension: Secondary | ICD-10-CM | POA: Diagnosis not present

## 2018-04-16 DIAGNOSIS — M1711 Unilateral primary osteoarthritis, right knee: Secondary | ICD-10-CM | POA: Diagnosis not present

## 2018-04-16 DIAGNOSIS — M19019 Primary osteoarthritis, unspecified shoulder: Secondary | ICD-10-CM

## 2018-04-16 DIAGNOSIS — M19012 Primary osteoarthritis, left shoulder: Secondary | ICD-10-CM | POA: Diagnosis not present

## 2018-04-16 LAB — LIPID PANEL
CHOLESTEROL TOTAL: 126 mg/dL (ref 100–199)
Chol/HDL Ratio: 2.3 ratio (ref 0.0–5.0)
HDL: 54 mg/dL (ref >39)
LDL Calculated: 53 mg/dL (ref 0–99)
Triglycerides: 93 mg/dL (ref 0–149)
VLDL Cholesterol Cal: 19 mg/dL (ref 5–40)

## 2018-04-16 LAB — COMPREHENSIVE METABOLIC PANEL
ALBUMIN: 4.3 g/dL (ref 3.6–4.8)
ALT: 14 IU/L (ref 0–44)
AST: 22 IU/L (ref 0–40)
Albumin/Globulin Ratio: 1.7 (ref 1.2–2.2)
Alkaline Phosphatase: 56 IU/L (ref 39–117)
BUN / CREAT RATIO: 16 (ref 10–24)
BUN: 21 mg/dL (ref 8–27)
Bilirubin Total: 0.8 mg/dL (ref 0.0–1.2)
CO2: 21 mmol/L (ref 20–29)
CREATININE: 1.32 mg/dL — AB (ref 0.76–1.27)
Calcium: 10.4 mg/dL — ABNORMAL HIGH (ref 8.6–10.2)
Chloride: 100 mmol/L (ref 96–106)
GFR calc non Af Amer: 56 mL/min/{1.73_m2} — ABNORMAL LOW (ref >59)
GFR, EST AFRICAN AMERICAN: 65 mL/min/{1.73_m2} (ref >59)
GLUCOSE: 101 mg/dL — AB (ref 65–99)
Globulin, Total: 2.5 g/dL (ref 1.5–4.5)
Potassium: 4.9 mmol/L (ref 3.5–5.2)
Sodium: 138 mmol/L (ref 134–144)
TOTAL PROTEIN: 6.8 g/dL (ref 6.0–8.5)

## 2018-04-16 LAB — CBC WITH DIFFERENTIAL/PLATELET
BASOS: 0 %
Basophils Absolute: 0 10*3/uL (ref 0.0–0.2)
EOS (ABSOLUTE): 0.1 10*3/uL (ref 0.0–0.4)
EOS: 2 %
HEMATOCRIT: 42.2 % (ref 37.5–51.0)
HEMOGLOBIN: 14.7 g/dL (ref 13.0–17.7)
Immature Grans (Abs): 0.1 10*3/uL (ref 0.0–0.1)
Immature Granulocytes: 1 %
LYMPHS ABS: 1.6 10*3/uL (ref 0.7–3.1)
Lymphs: 18 %
MCH: 31.1 pg (ref 26.6–33.0)
MCHC: 34.8 g/dL (ref 31.5–35.7)
MCV: 89 fL (ref 79–97)
MONOCYTES: 8 %
Monocytes Absolute: 0.7 10*3/uL (ref 0.1–0.9)
NEUTROS ABS: 6.3 10*3/uL (ref 1.4–7.0)
Neutrophils: 71 %
Platelets: 220 10*3/uL (ref 150–379)
RBC: 4.72 x10E6/uL (ref 4.14–5.80)
RDW: 15.4 % (ref 12.3–15.4)
WBC: 8.7 10*3/uL (ref 3.4–10.8)

## 2018-04-16 LAB — HEMOGLOBIN A1C
ESTIMATED AVERAGE GLUCOSE: 151 mg/dL
HEMOGLOBIN A1C: 6.9 % — AB (ref 4.8–5.6)

## 2018-04-16 NOTE — Patient Instructions (Addendum)
Call your insurance plan to find out if you qualify for Silver Sneakers, or some other kind of fitness program. Call to find out about Boxing classes for people with Parkinson's.  Call [facility] [phone] to schedule an appointment in 3 months.    IF you received an x-ray today, you will receive an invoice from [facility]. Please contact [facility] at [phone] with questions or concerns regarding your invoice.   IF you received labwork today, you will receive an invoice from [facility]. Please contact [facility] at [phone] with questions or concerns regarding your invoice.   Our billing staff will not be able to assist you with questions regarding bills from these companies.  You will be contacted with the lab results as soon as they are available. The fastest way to get your results is to activate your My Chart account. Instructions are located on the last page of this paperwork. If you have not heard from [place] regarding the results in 2 weeks, please contact this office.

## 2018-04-16 NOTE — Assessment & Plan Note (Signed)
Anticipate continued control.  Goal LDL is less than 70.

## 2018-04-16 NOTE — Assessment & Plan Note (Addendum)
At his last visit we saw the rise in A1c to 7%.  Would need to add an additional agent for improved control.  Encouraged increased physical activity, reduced dietary starch and sugar. Eye exam scheduled for 05/14/2018.

## 2018-04-16 NOTE — Progress Notes (Signed)
Patient ID: Danny [name], male    DOB: May 30, 1951, 67 y.o.   MRN: [id number]  PCP: [name], PA-C  Chief Complaint  Patient presents with  . Diabetes    3 month follow-up  . Hypertension    Subjective:   Presents for evaluation of diabetes, hypertension and hyperlipidemia.  He is accompanied by his wife, [name], as usual.  [name] has noticed increased shuffling and declining memory. Parkinson's is managed by neurology.  Home glucose readings 90-120's. Not exercising. His insurance does not cover Silver Sneakers. Has not pursued other ways to exercise. Previously active with [employer]. Tolerating medications without difficulty.  Reports RIGHT knee pain. Previous injury with fall, had arthroscopic ligamentous repair.  Intermittent LEFT arm/shoulder pain. Feels like a muscle sprain. Worse with pushing up when he rises from a chair and with pushing down, as in pumping the soap dispenser. No trauma/injury..    Review of Systems Constitutional: Positive for activity change.  HENT: Negative.   Eyes: Negative.   Respiratory: Negative.   Cardiovascular: Negative.   Gastrointestinal: Negative.   Endocrine: Negative.   Genitourinary: Negative.   Musculoskeletal: Positive for arthralgias and gait problem. Negative for joint swelling, myalgias, neck pain and neck stiffness.  Skin: Negative.   Allergic/Immunologic: Negative.   Neurological: Negative for dizziness, tremors, seizures, syncope, facial asymmetry, speech difficulty, light-headedness, numbness and headaches.  Hematological: Negative.   Psychiatric/Behavioral: Negative.        Patient Active Problem List   Diagnosis Date Noted  . Hematospermia 11/18/2015  . Epididymal cyst 05/25/2015  . Parkinson disease ([place]) 01/14/2014  . Obesity (BMI 30-39.9) 07/14/2013  . Confusional arousals 02/13/2013  . Disorder of ligament of right wrist 01/29/2012  . Diabetes mellitus type 2, uncomplicated ([place])  92/42/6834  . Mixed hyperlipidemia 10/09/2011  . OA (osteoarthritis) of knee 10/09/2011  . Essential hypertension, benign 10/09/2011     Prior to Admission medications   Medication Sig Start Date End Date Taking? Authorizing Provider  aspirin 81 MG tablet Take 81 mg by mouth daily.     Yes [provider]  carbidopa-levodopa (SINEMET IR) 25-100 MG tablet Take 1/2 pill twice daily x 1 week, then 1/2 pill 3 times a day x 1 week, then 1 pill 3 times a day thereafter. 09/09/17  Yes [name], MD  Cinnamon 500 MG capsule Take 500 mg by mouth daily.   Yes [provider]  enalapril (VASOTEC) 2.5 MG tablet Take 1 tablet by mouth  daily Patient taking differently: Take 2.5 mg by mouth every other day. Take 1 tablet by mouth  daily 05/08/16  Yes Wardell Honour, MD  Ginger, Zingiber officinalis, 550 MG CAPS Take 550 mg by mouth daily.     Yes [provider]  glipiZIDE (GLUCOTROL XL) 10 MG 24 hr tablet TAKE 1 TABLET BY MOUTH DAILY 10/15/17  Yes Salmaan Patchin, PA-C  Glucosamine-Chondroit-Vit C-Mn (GLUCOSAMINE CHONDR 1500 COMPLX PO) Take 1,500 mg by mouth daily.     Yes [provider]  glucose blood test strip Use as instructed 02/04/17  Yes Stacy Deshler, PA-C  Multiple Vitamin (MULTIVITAMIN) tablet Take 1 tablet by mouth daily.   Yes [provider]  pramipexole (MIRAPEX) 0.75 MG tablet TAKE 1 TABLET BY MOUTH 3 TIMES DAILY 02/05/18  Yes [name], MD  ranitidine (ZANTAC) 150 MG tablet Take 1 tablet (150 mg total) by mouth 2 (two) times daily. 07/16/17  Yes Zahlia Deshazer, PA-C  simvastatin (ZOCOR) 40 MG  tablet Take 0.5 tablets (20 mg total) by mouth every other day. 10/15/17  Yes Devone Bonilla, PA-C  simvastatin (ZOCOR) 40 MG tablet TAKE 0.5 TABLETS (20 MG TOTAL) BY MOUTH DAILY. 02/06/18  Yes Estellar Cadena, PA-C  sitaGLIPtin-metformin (JANUMET) 50-1000 MG tablet Take 1 tablet by mouth two  times daily with meals 10/15/17  Yes Raidon Swanner, PA-C       Allergies  Allergen Reactions  . Codeine Nausea And Vomiting       Objective:  Physical Exam  Constitutional: He is oriented to person, place, and time. He appears well-developed and well-nourished. He is active and cooperative. No distress.  BP 118/72   Pulse 80   Temp 98 F (36.7 C) (Oral)   Resp 16   Ht 5' 10.87" (1.8 m)   Wt 222 lb (100.7 kg)   SpO2 97%   BMI 31.08 kg/m   HENT:  Head: Normocephalic and atraumatic.  Right Ear: Hearing normal.  Left Ear: Hearing normal.  Eyes: Conjunctivae are normal. No scleral icterus.  Neck: Normal range of motion. Neck supple. No thyromegaly present.  Cardiovascular: Normal rate, regular rhythm and normal heart sounds.  Pulses:      Radial pulses are 2+ on the right side, and 2+ on the left side.  Pulmonary/Chest: Effort normal and breath sounds normal.  Musculoskeletal:       Right shoulder: Normal.       Left shoulder: He exhibits decreased range of motion, tenderness (AC) and pain. He exhibits no swelling, no effusion, no crepitus, no deformity, no laceration, no spasm, normal pulse and normal strength.       Left elbow: Normal.       Right knee: He exhibits normal range of motion, no swelling, no effusion, no ecchymosis, no deformity, no laceration, no erythema, normal alignment, no LCL laxity, normal patellar mobility, no bony tenderness, normal meniscus and no MCL laxity. Tenderness found. Medial joint line (pain with Varus stress) tenderness noted. No lateral joint line, no MCL, no LCL and no patellar tendon tenderness noted.       Cervical back: Normal.       Left upper arm: Normal.  Lymphadenopathy:       Head (right side): No tonsillar, no preauricular, no posterior auricular and no occipital adenopathy present.       Head (left side): No tonsillar, no preauricular, no posterior auricular and no occipital adenopathy present.    He has no cervical adenopathy.       Right: No supraclavicular adenopathy present.        Left: No supraclavicular adenopathy present.  Neurological: He is alert and oriented to person, place, and time. No sensory deficit.  Skin: Skin is warm, dry and intact. No rash noted. No cyanosis or erythema. Nails show no clubbing.  Psychiatric: He has a normal mood and affect. His speech is normal and behavior is normal.   Wt Readings from Last 3 Encounters:  05/05/18 218 lb (98.9 kg)  04/24/18 220 lb (99.8 kg)  04/16/18 222 lb (100.7 kg)      Assessment & Plan:   Problem List Items Addressed This Visit    Mixed hyperlipidemia (Chronic)    Anticipate continued control.  Goal LDL is less than 70.      Relevant Orders   Lipid panel (Completed)   Comprehensive metabolic panel (Completed)   Essential hypertension, benign (Chronic)    Well-controlled on current regimen.  No changes.      Relevant Orders  CBC with Differential/Platelet (Completed)   Diabetes mellitus type 2, uncomplicated ([place]) - Primary (Chronic)    At his last visit we saw the rise in A1c to 7%.  Would need to add an additional agent for improved control.  Encouraged increased physical activity, reduced dietary starch and sugar. Eye exam scheduled for 05/14/2018.      Relevant Orders   Hemoglobin A1c (Completed)   Microalbumin / creatinine urine ratio (Completed)    Other Visit Diagnoses    Left shoulder pain, unspecified chronicity       Await radiographs. Anticipate referral to orthopedics.   Relevant Orders   DG Shoulder Left (Completed)   Chronic pain of right knee       Await radiographs. Anticipate referral to orthopedics.   Relevant Orders   DG Knee Complete 4 Views Right (Completed)       Return in about 3 months (around 07/17/2018) for re-evaluation of diabetes, blood pressure and cholesterol.   [name], PA-C [facility]

## 2018-04-16 NOTE — Assessment & Plan Note (Signed)
Well controlled on current regimen.  No changes.  

## 2018-04-16 NOTE — Progress Notes (Signed)
Subjective:    Patient ID: Danny [name], male    DOB: Oct 03, 1951, 67 y.o.   MRN: [id number] Chief Complaint  Patient presents with  . Diabetes    3 month follow-up  . Hypertension   HPI  67 yo male with history of parkinson's, diabetes, hyperlipidemia, and hypertension presents for follow up of diabetes, HTN and HLD. He is accompanied by his wife, Danny [name]. Scheduled eye exam may 29th.   Recent family issues, his wife's sister broke her hip and her other sister recently in a car crash, broken sternum/fingers, otherwise well.  Wife notes she has noticed him shuffling more and his memory is slowly getting worse.  Blood sugars: . AM averages between 90-120s. Diet: Consistent diet, low carb, low sugar. Exercise:Parkinson's progressing, patient unable to participate in square dancing and his exercise level has greatly diminished. Will try to attend a gym. Wife has membership to silver sneakers but his insurance will not cover, and they will not allow him to attend with her. Medications: Sitagliptin - metformin combo - 50-1000mg  and Glipizide XL 10mg . Side effects: Denies any side effects.  Last A1c: 7.0%   Knee pain - 20 years ago he fell and tore ligaments and had an arthroscopy. Increased pain with walking up steps. Sharp pain in the medial aspect.  History of OA.  Left arm pain - He denies arm pain today but normally it feels like a muscle sprain, intermittent, extend into his shoulder. Denies any trauma or known cause. He reports the pain radiates from his wrist into his shoulder. Pain worse with pushing himself up in a chair or pushing down soap dispenser.   Review of Systems  Constitutional: Positive for activity change.  HENT: Negative.   Eyes: Negative.   Respiratory: Negative.   Cardiovascular: Negative.   Gastrointestinal: Negative.   Endocrine: Negative.   Genitourinary: Negative.   Musculoskeletal: Positive for arthralgias and gait problem. Negative for joint swelling,  myalgias, neck pain and neck stiffness.  Skin: Negative.   Allergic/Immunologic: Negative.   Neurological: Negative for dizziness, tremors, seizures, syncope, facial asymmetry, speech difficulty, light-headedness, numbness and headaches.  Hematological: Negative.   Psychiatric/Behavioral: Negative.        Objective:   Physical Exam  Constitutional: He is oriented to person, place, and time. He appears well-developed and well-nourished. No distress.  BP 118/72   Pulse 80   Temp 98 F (36.7 C) (Oral)   Resp 16   Ht 5' 10.87" (1.8 m)   Wt 222 lb (100.7 kg)   SpO2 97%   BMI 31.08 kg/m    HENT:  Head: Normocephalic and atraumatic.  Eyes: Right eye exhibits no discharge. Left eye exhibits no discharge. No scleral icterus.  Cardiovascular: Normal rate, regular rhythm, normal heart sounds and intact distal pulses.  Pulmonary/Chest: Effort normal and breath sounds normal.  Musculoskeletal: He exhibits no edema.       Left shoulder: He exhibits tenderness and pain (AC joint ). He exhibits normal range of motion, no bony tenderness, no swelling, no effusion, no crepitus, no deformity, no laceration, no spasm, normal pulse and normal strength.       Right knee: He exhibits normal range of motion, no swelling, no effusion, no ecchymosis, no deformity, normal patellar mobility and no bony tenderness. Tenderness found. Medial joint line tenderness noted. No lateral joint line, no MCL, no LCL and no patellar tendon tenderness noted.  Shoulder: Increased pain with flexion and extension.  Anterior pain, increased with palpation  Knee: Increased pain with varus stress.  Neurological: He is alert and oriented to person, place, and time. Gait (shuffle) abnormal.  Skin: Skin is warm and dry. No rash noted. He is not diaphoretic. No erythema. No pallor.  Psychiatric: He has a normal mood and affect. His behavior is normal. Judgment and thought content normal.      Assessment & Plan:  1. Type 2  diabetes mellitus without complication, without long-term current use of insulin (HCC) Last visit, A1c up to 7% from 6.4% Encouraged to exercise and continue healthy eating habits. Await labs and adjust medications as needed. Currently on max dose for 2 meds. - Hemoglobin A1c - Microalbumin / creatinine urine ratio  2. Essential hypertension, benign Controlled, continue current treatment plan. - CBC with Differential/Platelet  3. Mixed hyperlipidemia Controlled at this time, await labs and adjust. Adjust dose if LDL > 70. - Lipid panel - Comprehensive metabolic panel  4. Left shoulder pain, unspecified chronicity. Exam consistent with AC joint pain, await x-ray. - DG Shoulder Left; Future  5. Chronic pain of right knee Chronic pain, history of OA.  Await x-ray to determine progression of chronic or acute etiology. - DG Knee Complete 4 Views Right; Future

## 2018-04-17 DIAGNOSIS — E119 Type 2 diabetes mellitus without complications: Secondary | ICD-10-CM | POA: Diagnosis not present

## 2018-04-18 LAB — MICROALBUMIN / CREATININE URINE RATIO
CREATININE, UR: 344.4 mg/dL
MICROALB/CREAT RATIO: 15.2 mg/g{creat} (ref 0.0–30.0)
MICROALBUM., U, RANDOM: 52.2 ug/mL

## 2018-04-24 ENCOUNTER — Encounter (INDEPENDENT_AMBULATORY_CARE_PROVIDER_SITE_OTHER): Payer: Self-pay | Admitting: Orthopaedic Surgery

## 2018-04-24 ENCOUNTER — Ambulatory Visit (INDEPENDENT_AMBULATORY_CARE_PROVIDER_SITE_OTHER): Payer: Medicare Other | Admitting: Orthopaedic Surgery

## 2018-04-24 VITALS — BP 105/69 | HR 88 | Resp 18 | Ht 71.0 in | Wt 220.0 lb

## 2018-04-24 DIAGNOSIS — M25512 Pain in left shoulder: Secondary | ICD-10-CM | POA: Diagnosis not present

## 2018-04-24 DIAGNOSIS — G8929 Other chronic pain: Secondary | ICD-10-CM | POA: Diagnosis not present

## 2018-04-24 DIAGNOSIS — M25561 Pain in right knee: Secondary | ICD-10-CM | POA: Diagnosis not present

## 2018-04-24 MED ORDER — METHYLPREDNISOLONE ACETATE 40 MG/ML IJ SUSP
80.0000 mg | INTRAMUSCULAR | Status: AC | PRN
  Administered 2018-04-24: 80 mg

## 2018-04-24 MED ORDER — LIDOCAINE HCL 2 % IJ SOLN
2.0000 mL | INTRAMUSCULAR | Status: AC | PRN
  Administered 2018-04-24: 2 mL

## 2018-04-24 MED ORDER — BUPIVACAINE HCL 0.5 % IJ SOLN
2.0000 mL | INTRAMUSCULAR | Status: AC | PRN
  Administered 2018-04-24: 2 mL via INTRA_ARTICULAR

## 2018-04-24 NOTE — Progress Notes (Signed)
Office Visit Note   Patient: Danny [name]           Date of Birth: [birth year]           MRN: [id number] Visit Date: 04/24/2018              Requested by: Danny Mons, PA-C [address] [place], [place] [address] PCP: Danny Mons, PA-C   Assessment & Plan: Visit Diagnoses:  1. Chronic pain of right knee   2. Chronic left shoulder pain     Plan: Left shoulder pain consistent with impingement.  Certainly could have a rotator cuff tear.  Will inject the subacromial region and have him return in 2 weeks.  He is fully aware that this may elevate his blood sugars may be sore and stiff for several days  The right knee pain appears to be related to the osteoarthritis of the medial compartment I would consider injecting his knee when he returns.  Long discussion 45 minutes regarding problem with his left shoulder and right knee 50% of the time was in counseling gnosis and treatment options in detail  Follow-Up Instructions: Return in about 2 weeks (around 05/08/2018).   Orders:  Orders Placed This Encounter  Procedures  . Large Joint Inj: L subacromial bursa   No orders of the defined types were placed in this encounter.     Procedures: Large Joint Inj: L subacromial bursa on 04/24/2018 12:20 PM Indications: pain and diagnostic evaluation Details: 25 G 1.5 in needle, anterolateral approach  Arthrogram: No  Medications: 2 mL lidocaine 2 %; 2 mL bupivacaine 0.5 %; 80 mg methylPREDNISolone acetate 40 MG/ML Consent was given by the patient. Immediately prior to procedure a time out was called to verify the correct patient, procedure, equipment, support staff and site/side marked as required. Patient was prepped and draped in the usual sterile fashion.       Clinical Data: No additional findings.   Subjective: Chief Complaint  Patient presents with  . Right Knee - Pain  . Left Shoulder - Pain  . Knee Pain    Right knee pain x 1 month, sharp,  arthroscopic x 21  years - Dr. Telford [name], no injury, popping, difficulty walking, difficulty sleeping, giving way, difficulty bearning weight, difficulty with steps, diabetic, Tylenol, IBU helps some  . Shoulder Pain    Left shoulder pain x 2 months, pain from shoulder to wrist, radiates, no injury, no surgery, limited range of motion,  [name] is accompanied by his wife and here for evaluation of left shoulder and right knee pain.  Terms of his right knee Dr. Tommie [name] performed an arthroscopy many years ago and was doing well up until just recently.  Without injury or trauma he has had recurrent pain in his knee predominantly along the medial compartment.  He is having difficulty maneuvering.  He does walk with a limp.  He has a history of Parkinson's also is a non-insulin diabetic. Left shoulder pain with insidious onset.  Having difficulty sleeping on his left shoulder and raising it overhead.  No injury or trauma.  He notes that the pain is left shoulder is worse than his right knee.  Occasionally left some numbness and tingling radiating to his forearm denies any neck pain. I did review films of his left shoulder and right knee on the PACS system.  There is irregularity along the medial joint of the right knee particularly of the femur with subchondral sclerosis and narrowing of the joint space.  There is about 3 degrees of varus.  No ectopic ossification.  Films of the left shoulder reveal degenerative changes of the acromioclavicular joint.  There is a normal space between the humeral head and the acromion.  No ectopic calcification.  Humeral head is centered about the glenoid.  No arthritic changes about the glenohumeral joint  HPI  Review of Systems  Constitutional: Positive for activity change.  HENT: Negative for trouble swallowing.   Eyes: Negative for pain.  Respiratory: Negative for shortness of breath.   Cardiovascular: Negative for leg swelling.  Gastrointestinal: Negative for constipation.  Endocrine:  Negative for cold intolerance.  Genitourinary: Negative for difficulty urinating.  Musculoskeletal: Positive for gait problem and joint swelling.  Skin: Negative for rash.  Allergic/Immunologic: Negative for food allergies.  Neurological: Positive for weakness.  Psychiatric/Behavioral: Positive for sleep disturbance.     Objective: Vital Signs: BP 105/69 (BP Location: Left Arm, Patient Position: Sitting, Cuff Size: Normal)   Pulse 88   Resp 18   Ht 5\' 11"  (1.803 m)   Wt 220 lb (99.8 kg)   BMI 30.68 kg/m   Physical Exam  Constitutional: He is oriented to person, place, and time. He appears well-developed and well-nourished.  HENT:  Mouth/Throat: Oropharynx is clear and moist.  Eyes: Pupils are equal, round, and reactive to light. EOM are normal.  Pulmonary/Chest: Effort normal.  Neurological: He is alert and oriented to person, place, and time.  Skin: Skin is warm and dry.  Psychiatric: He has a normal mood and affect. His behavior is normal.  Has some generalized rigidity and tremor related to his Parkinson's.  His speech is a little slow.  Ortho Exam left shoulder exam with positive impingement and positive empty can testing.  I could raise the left arm fully overhead but with a circuitous motion.  Tenderness along the anterior subacromial region.  No popping or clicking.  No pain at the acromioclavicular joint.  Biceps intact.  Skin intact.  No pain referable to the cervical spine.  No swelling of left hand.  Good sensibility. Right knee with incomplete extension lacking approximately 10 degrees.  Diffuse medial joint tenderness predominantly anteriorly.  Mild patellar crepitation.  No effusion.  No lateral joint pain no calf pain.  No popliteal fullness.  Sensory exam intact.  Flexed over 100 degrees without instability  Specialty Comments:  No specialty comments available.  Imaging: No results found.   PMFS History: Patient Active Problem List   Diagnosis Date Noted  .  Hematospermia 11/18/2015  . Epididymal cyst 05/25/2015  . Parkinson disease ([place]) 01/14/2014  . Obesity (BMI 30-39.9) 07/14/2013  . Confusional arousals 02/13/2013  . Disorder of ligament of right wrist 01/29/2012  . Diabetes mellitus type 2, uncomplicated ([place]) 96/29/5284  . Mixed hyperlipidemia 10/09/2011  . OA (osteoarthritis) of knee 10/09/2011  . Essential hypertension, benign 10/09/2011   Past Medical History:  Diagnosis Date  . Cataract   . Essential hypertension, benign   . Mixed hyperlipidemia   . OA (osteoarthritis) of knee    right  . Obesity, unspecified   . Parkinsons ([place])   . Type II or unspecified type diabetes mellitus without mention of complication, not stated as uncontrolled     Family History  Problem Relation Age of Onset  . Diabetes Sister   . Diabetes Daughter   . Cancer Father   . Heart disease Father   . Diabetes Mother     Past Surgical History:  Procedure Laterality Date  .  CATARACT EXTRACTION, BILATERAL    . GANGLION CYST EXCISION  age 32 years   right  . KNEE ARTHROSCOPY     right  . KNEE ARTHROSCOPY    . WRIST FRACTURE SURGERY    . WRIST SURGERY  04/2012   Social History   Occupational History  . Occupation: Chief Financial Officer  Tobacco Use  . Smoking status: Former Smoker    Packs/day: 1.50    Years: 15.00    Pack years: 22.50    Types: Cigarettes    Last attempt to quit: 12/17/1982    Years since quitting: 35.3  . Smokeless tobacco: Never Used  . Tobacco comment: over 40 yrs  Substance and Sexual Activity  . Alcohol use: Yes    Comment: very rare  . Drug use: No  . Sexual activity: Yes    Partners: Female

## 2018-05-05 ENCOUNTER — Ambulatory Visit (INDEPENDENT_AMBULATORY_CARE_PROVIDER_SITE_OTHER): Payer: Medicare Other | Admitting: Orthopaedic Surgery

## 2018-05-05 ENCOUNTER — Encounter (INDEPENDENT_AMBULATORY_CARE_PROVIDER_SITE_OTHER): Payer: Self-pay | Admitting: Orthopaedic Surgery

## 2018-05-05 VITALS — BP 115/75 | HR 86 | Ht 71.0 in | Wt 218.0 lb

## 2018-05-05 DIAGNOSIS — M1711 Unilateral primary osteoarthritis, right knee: Secondary | ICD-10-CM | POA: Diagnosis not present

## 2018-05-05 MED ORDER — LIDOCAINE HCL 1 % IJ SOLN
2.0000 mL | INTRAMUSCULAR | Status: AC | PRN
  Administered 2018-05-05: 2 mL

## 2018-05-05 MED ORDER — BUPIVACAINE HCL 0.5 % IJ SOLN
2.0000 mL | INTRAMUSCULAR | Status: AC | PRN
  Administered 2018-05-05: 2 mL via INTRA_ARTICULAR

## 2018-05-05 MED ORDER — METHYLPREDNISOLONE ACETATE 40 MG/ML IJ SUSP
80.0000 mg | INTRAMUSCULAR | Status: AC | PRN
  Administered 2018-05-05: 80 mg

## 2018-05-05 NOTE — Progress Notes (Signed)
Office Visit Note   Patient: Danny [name]           Date of Birth: 11/14/1951           MRN: [id number] Visit Date: 05/05/2018              Requested by: [name], PA-C [place], [place] [address] PCP: [name], PA-C   Assessment & Plan: Visit Diagnoses:  1. Primary osteoarthritis of right knee     Plan: Will inject right knee with cortisone.  Aware that he may be sore for several days.  Serum glucose may elevate for several days.  We will plan to see back as needed  Follow-Up Instructions: Return if symptoms worsen or fail to improve.   Orders:  Orders Placed This Encounter  Procedures  . Large Joint Inj: R knee   No orders of the defined types were placed in this encounter.     Procedures: Large Joint Inj: R knee on 05/05/2018 10:27 AM Indications: pain and diagnostic evaluation Details: 25 G 1.5 in needle, anteromedial approach  Arthrogram: No  Medications: 2 mL lidocaine 1 %; 2 mL bupivacaine 0.5 %; 80 mg methylPREDNISolone acetate 40 MG/ML Procedure, treatment alternatives, risks and benefits explained, specific risks discussed. Consent was given by the patient. Immediately prior to procedure a time out was called to verify the correct patient, procedure, equipment, support staff and site/side marked as required. Patient was prepped and draped in the usual sterile fashion.       Clinical Data: No additional findings.   Subjective: Chief Complaint  Patient presents with  . Right Knee - Pain  . Follow-up    R KNEE PAIN WOULD LIE INJECTION IN KNEE, PT DIABETIC HAD INJ IN LEFT SHOULDER 2 WEEKS AGO, DOING WELL FOR NOW  Danny [name] was seen 2 weeks ago for evaluation of left shoulder and right knee pain.  I injected the subacromial space of his left shoulder he notes it is made a big difference.  He was sore for a day.  His blood sugar is elevated slightly.  But he is feeling much better.  He also was diagnosed with osteoarthritis of  his right knee.  He would like to proceed with a cortisone injection  HPI  Review of Systems  Constitutional: Negative for fatigue and fever.  HENT: Negative for ear pain.   Eyes: Negative for pain.  Respiratory: Negative for cough and shortness of breath.   Cardiovascular: Negative for leg swelling.  Gastrointestinal: Negative for constipation and diarrhea.  Genitourinary: Negative for difficulty urinating.  Musculoskeletal: Positive for back pain. Negative for neck pain.  Skin: Negative for rash.  Allergic/Immunologic: Negative for food allergies.  Neurological: Positive for weakness. Negative for numbness.  Hematological: Bruises/bleeds easily.  Psychiatric/Behavioral: Positive for sleep disturbance.     Objective: Vital Signs: BP 115/75 (BP Location: Left Arm, Patient Position: Sitting, Cuff Size: Normal)   Pulse 86   Ht 5\' 11"  (1.803 m)   Wt 218 lb (98.9 kg)   BMI 30.40 kg/m   Physical Exam  Constitutional: He is oriented to person, place, and time. He appears well-developed and well-nourished.  HENT:  Mouth/Throat: Oropharynx is clear and moist.  Eyes: Pupils are equal, round, and reactive to light. EOM are normal.  Pulmonary/Chest: Effort normal.  Neurological: He is alert and oriented to person, place, and time.  Skin: Skin is warm and dry.  Psychiatric: He has a normal mood and affect. His behavior is  normal.    Ortho Exam awake alert and oriented x3 comfortable sitting.  Has history of Parkinson's and does use a cane in his right hand.  No significant tremors today minimal effusion right knee.  Predominant medial joint pain.  Lacks just a few degrees to full extension and flexed over 105 degrees without instability.  No distal edema.  No popliteal mass.  No evidence of instability. Specialty Comments:  No specialty comments available.  Imaging: No results found.   PMFS History: Patient Active Problem List   Diagnosis Date Noted  . Hematospermia 11/18/2015    . Epididymal cyst 05/25/2015  . Parkinson disease ([place]) 01/14/2014  . Obesity (BMI 30-39.9) 07/14/2013  . Confusional arousals 02/13/2013  . Disorder of ligament of right wrist 01/29/2012  . Diabetes mellitus type 2, uncomplicated ([place]) 87/68/1157  . Mixed hyperlipidemia 10/09/2011  . OA (osteoarthritis) of knee 10/09/2011  . Essential hypertension, benign 10/09/2011   Past Medical History:  Diagnosis Date  . Cataract   . Essential hypertension, benign   . Mixed hyperlipidemia   . OA (osteoarthritis) of knee    right  . Obesity, unspecified   . Parkinsons ([place])   . Type II or unspecified type diabetes mellitus without mention of complication, not stated as uncontrolled     Family History  Problem Relation Age of Onset  . Diabetes Sister   . Diabetes Daughter   . Cancer Father   . Heart disease Father   . Diabetes Mother     Past Surgical History:  Procedure Laterality Date  . CATARACT EXTRACTION, BILATERAL    . GANGLION CYST EXCISION  age 12 years   right  . KNEE ARTHROSCOPY     right  . KNEE ARTHROSCOPY    . WRIST FRACTURE SURGERY    . WRIST SURGERY  04/2012   Social History   Occupational History  . Occupation: Chief Financial Officer  Tobacco Use  . Smoking status: Former Smoker    Packs/day: 1.50    Years: 15.00    Pack years: 22.50    Types: Cigarettes    Last attempt to quit: 12/17/1982    Years since quitting: 35.4  . Smokeless tobacco: Never Used  . Tobacco comment: over 40 yrs  Substance and Sexual Activity  . Alcohol use: Yes    Comment: very rare  . Drug use: No  . Sexual activity: Yes    Partners: Female

## 2018-05-14 ENCOUNTER — Encounter: Payer: Self-pay | Admitting: Physician Assistant

## 2018-05-14 DIAGNOSIS — E119 Type 2 diabetes mellitus without complications: Secondary | ICD-10-CM | POA: Diagnosis not present

## 2018-05-14 LAB — HM DIABETES EYE EXAM

## 2018-05-20 ENCOUNTER — Other Ambulatory Visit: Payer: Self-pay | Admitting: Neurology

## 2018-05-27 ENCOUNTER — Other Ambulatory Visit: Payer: Self-pay

## 2018-05-27 MED ORDER — PRAMIPEXOLE DIHYDROCHLORIDE 0.75 MG PO TABS: 0.7500 mg | ORAL_TABLET | Freq: Three times a day (TID) | ORAL | 3 refills | Status: DC

## 2018-06-05 ENCOUNTER — Ambulatory Visit (INDEPENDENT_AMBULATORY_CARE_PROVIDER_SITE_OTHER): Payer: Medicare Other | Admitting: Neurology

## 2018-06-05 ENCOUNTER — Encounter: Payer: Self-pay | Admitting: Neurology

## 2018-06-05 VITALS — BP 127/80 | HR 96 | Ht 71.0 in | Wt 223.0 lb

## 2018-06-05 DIAGNOSIS — G20 Parkinson's disease: Secondary | ICD-10-CM

## 2018-06-05 MED ORDER — CARBIDOPA-LEVODOPA 25-100 MG PO TABS: 1.0000 | ORAL_TABLET | Freq: Three times a day (TID) | ORAL | 3 refills | Status: DC

## 2018-06-05 MED ORDER — PRAMIPEXOLE DIHYDROCHLORIDE 0.75 MG PO TABS: 0.7500 mg | ORAL_TABLET | Freq: Three times a day (TID) | ORAL | 3 refills | Status: DC

## 2018-06-05 NOTE — Progress Notes (Signed)
Subjective:    Patient ID: Danny [name] is a 67 y.o. male.  HPI     Interim history:   Mr. Keelan is a very pleasant 67 year old right-handed gentleman with an underlying medical history of type 2 diabetes, osteoarthritis, hypertension, obesity, hyperlipidemia, and cataracts, who presents for follow-up consultation of his Parkinson's disease, with history of RBD. The patient is accompanied by his wife again today. I last saw him on 12/05/2017, at which time he felt fairly stable on Sinemet, we had reduced his Mirapex to 0.75 mg 3 times a day. He is trying to stay active. He had no recent falls. I suggested we continue with his Sinemet and Mirapex at the current doses.  Today, 06/05/2018 (all dictated new, as well as above notes, some dictation done in note pad or Word, outside of chart, may appear as copied):    He reports doing alright, some more shuffling noted by wife, less facial expression, more irritable, no falls thankfully. Some forgetfulness. Overall, the more noticeable issues with his walking and slowness. He does seem to be more sleepy during the day especially if he is not active.    The patient's allergies, current medications, family history, past medical history, past social history, past surgical history and problem list were reviewed and updated as appropriate.    Previously (copied from previous notes for reference):   I saw him on 09/09/2017 at which time he had been more sleepy during the day. His wife was worried that he was more preoccupied with sex. He had ongoing issues with intermittent double vision. He had intermittent and mild dream enactments. Suggested we gradually start him on low-dose Sinemet starting with half pill and gradually increasing this to 1 pill 3 times a day. His wife called in the interim in October reporting that he had done fairly well with Sinemet, at which time we reduced his Mirapex from 1 mg 3 times a day to 0.75 mg 3 times a day.      I  saw him on 03/04/2017, at which time he felt stable. He retired in December 2017. The daughter was living with them. Mother-in-law had moved in as well. His wife retired also. He was going to the gym on a regular basis. Memory-wise, he had mild forgetfulness, no mood issues, no sleep issues, no side effects from the Mirapex. He had no recent falls thankfully. He had some mild dream enactments but no major issues. Looking back, his symptoms of peak date back to 2014, he had a sleep study in early 2014 with PLMS noted but no OSA and he denies restless leg symptoms. We mutually agreed to taper him off of Azilect as it was too expensive. I suggested in lieu of the Azilect, we increase his pramipexole to 1 mg 3 times a day.     I saw him on 09/03/2016, at which time he reported doing well, no telltale differences after increasing the Mirapex. He gave up driving. He decided to retire by the end of December 2017. His mother-in-law was supposed to move in with them. His memory was stable, sleep was stable, no real mood issues, no recent falls. I suggested we keep his medications the same including Azilect 1 mg once daily, Mirapex 0.75 mg 3 times a day.   I saw him on 02/27/2016, at which time he reported more difficulty with his walking, he had become slower. Wife had noted that it would take him longer to do things. He was dragging his  feet at times. He was not swinging his arms as well. He was working full-time as a Land, avoiding stairs and heights. His A1c in December 2016 was a little up at 7.6. Memory was stable, with some mild forgetfulness. Mood was stable, RBD was infrequent. He reported no recent falls. I suggested he continue with Azilect and we increased his Mirapex to 0.75 mg 3 times a day.    I saw him on 08/29/2015 at which time he reported doing fairly well. He was able to tolerate Azilect. His Mirapex was 0.5 mg 3 times a day. He was working at the computer most of the time. He was not  always drinking enough water. He had a good appetite. He felt his memory was stable. RBD was less frequent but still occurring. His wife was able to sleep in the same bed with him. He had no recent falls. He had some recent blurry vision was going to see his ophthalmologist. We mutually agreed to continue his current medication regimen.   I saw him on 04/25/2015, at which time he reported doing fairly well overall, but had noted smaller changes, including more slowness, difficulty with climbing ladders. He was advised by his boss to not climb ladders or walk over obstacles at work. Thankfully, his work environment is understanding. He had another business trip coming up and a colleague was to drive and do the climbing on the sites. He reported mild forgetfulness and some problems with depth perception. His diplopia had improved since he was given prism glasses by his ophthalmologist. I suggested, we try him on Azilect and keep his Mirapex the same.    I saw him on 02/04/2015, at which time he reported ongoing issues with diplopia, particularly at night while driving. He was still working full-time. He was still on low-dose Mirapex, 0.25 mg 3 times a day. He had no side effects. He had had a sleep study which per his report did not show any OSA. I asked him to make an appointment with his eye doctor. I suggested an increase in Mirapex to 0.5 mg 3 times a day.   I first met him on 12/28/2014, at which time he reported that he was having more fatigue. However, he also had reduce his caffeine intake. He had no recent falls and mood stable. He was working full-time. He reported that he had intermittent double vision in the last 3 months. His PCP requested a sooner than scheduled appointment for diplopia. His exam was stable at the time and I suggested no new medication changes.   He has had some short-term memory issues. He works full-time. He is an Chief Financial Officer. He likes square dance but his wife has noted that he  has had more problems with his nighttime driving and his squared hands. In the recent 3 months he has had some intermittent double vision at night. He has drooling at night. He rarely drinks alcohol and usually drinks 4-5 cups of coffee per day, occasional sodas. He quit smoking in 1978. Blood pressure is low for him today. It was recently noted to be trending lower and his blood pressure medication was reduced. He takes his cholesterol medication every other day.   He had a brain MRI without contrast on 11/04/2013: Normal MRI scan of the brain. Incidental findings of a chronic paranasal sinusitis with deviated nasal septum to the left.  He previously followed with Dr. Jim Like and was last seen by him on 08/30/2014, at which time his  Mirapex was kept at 0.25 mg 3 times a day. He reported no side effects, in particular no impulse control disorder. I reviewed Dr. Hazle Quant notes. He first met Dr. Janann Colonel on 10/26/13, at which time the patient reported a right hand tremor noticed over the previous 4-6 months. He had changes in his handwriting, slowness, some stiffness and changes in his walking.  His wife reported a longer standing history of REM behavior disorder. He has been in outpatient physical therapy.  His Past Medical History Is Significant For: Past Medical History:  Diagnosis Date  . Cataract   . Essential hypertension, benign   . Mixed hyperlipidemia   . OA (osteoarthritis) of knee    right  . Obesity, unspecified   . Parkinsons ([place])   . Type II or unspecified type diabetes mellitus without mention of complication, not stated as uncontrolled     His Past Surgical History Is Significant For: Past Surgical History:  Procedure Laterality Date  . CATARACT EXTRACTION, BILATERAL    . GANGLION CYST EXCISION  age 12 years   right  . KNEE ARTHROSCOPY     right  . KNEE ARTHROSCOPY    . WRIST FRACTURE SURGERY    . WRIST SURGERY  04/2012    His Family History Is Significant For: Family  History  Problem Relation Age of Onset  . Diabetes Sister   . Diabetes Daughter   . Cancer Father   . Heart disease Father   . Diabetes Mother     His Social History Is Significant For: Social History   Socioeconomic History  . Marital status: Married    Spouse name: [name]  . Number of children: 3  . Years of education: 16  . Highest education level: Bachelor's degree (e.g., BA, AB, BS)  Occupational History  . Occupation: Glass blower/designer  . Financial resource strain: Not hard at all  . Food insecurity:    Worry: Never true    Inability: Never true  . Transportation needs:    Medical: No    Non-medical: No  Tobacco Use  . Smoking status: Former Smoker    Packs/day: 1.50    Years: 15.00    Pack years: 22.50    Types: Cigarettes    Last attempt to quit: 12/17/1982    Years since quitting: 35.4  . Smokeless tobacco: Never Used  . Tobacco comment: over 40 yrs  Substance and Sexual Activity  . Alcohol use: Yes    Comment: very rare  . Drug use: No  . Sexual activity: Yes    Partners: Female  Lifestyle  . Physical activity:    Days per week: 0 days    Minutes per session: 0 min  . Stress: Not at all  Relationships  . Social connections:    Talks on phone: More than three times a week    Gets together: More than three times a week    Attends religious service: More than 4 times per year    Active member of club or organization: Yes    Attends meetings of clubs or organizations: More than 4 times per year    Relationship status: Married  Other Topics Concern  . Not on file  Social History Narrative   [name], motorcycle rider.  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  He has 3 adult children.  His wife had 2 children, one of whom is deceased.   Patient is working full-time.   Patient has a Bachelor's degree.  Patient is right-handed.   Patient drinks 6 cups of coffee daily.    His Allergies Are:  Allergies  Allergen Reactions  . Codeine Nausea And  Vomiting  :   His Current Medications Are:  Outpatient Encounter Medications as of 06/05/2018  Medication Sig  . aspirin 81 MG tablet Take 81 mg by mouth daily.    . carbidopa-levodopa (SINEMET IR) 25-100 MG tablet Take 1 tablet by mouth 3 (three) times daily.  . Cinnamon 500 MG capsule Take 500 mg by mouth daily.  . enalapril (VASOTEC) 2.5 MG tablet Take 1 tablet by mouth  daily (Patient taking differently: Take 2.5 mg by mouth every other day. Take 1 tablet by mouth  daily)  . Ginger, Zingiber officinalis, 550 MG CAPS Take 550 mg by mouth daily.    Marland Kitchen glipiZIDE (GLUCOTROL XL) 10 MG 24 hr tablet TAKE 1 TABLET BY MOUTH DAILY  . Glucosamine-Chondroit-Vit C-Mn (GLUCOSAMINE CHONDR 1500 COMPLX PO) Take 1,500 mg by mouth daily.    Marland Kitchen glucose blood test strip Use as instructed  . Multiple Vitamin (MULTIVITAMIN) tablet Take 1 tablet by mouth daily.  . pramipexole (MIRAPEX) 0.75 MG tablet Take 1 tablet (0.75 mg total) by mouth 3 (three) times daily.  . ranitidine (ZANTAC) 150 MG tablet Take 1 tablet (150 mg total) by mouth 2 (two) times daily.  . simvastatin (ZOCOR) 40 MG tablet TAKE 0.5 TABLETS (20 MG TOTAL) BY MOUTH DAILY.  . sitaGLIPtin-metformin (JANUMET) 50-1000 MG tablet Take 1 tablet by mouth two  times daily with meals  . [DISCONTINUED] simvastatin (ZOCOR) 40 MG tablet Take 0.5 tablets (20 mg total) by mouth every other day.   No facility-administered encounter medications on file as of 06/05/2018.   :  Review of Systems:  Out of a complete 14 point review of systems, all are reviewed and negative with the exception of these symptoms as listed below: Review of Systems  Neurological:       Patient reports that he is doing well.     Objective:  Neurological Exam  Physical Exam Physical Examination:   Vitals:   06/05/18 1138  BP: 127/80  Pulse: 96    General Examination: The patient is a very pleasant 67 y.o. male in no acute distress. He appears well-developed and well-nourished  and well groomed.   HEENT:Normocephalic, atraumatic, pupils are equal, round and reactive to light and accommodation. Extraocular trackingshows mild saccadic breakdown, no nystagmus, no significant limitation to downgaze, slight limitation to upper gaze. He has mild decrease in eye blink rate, status post bilateral cataract repairs, hearing is grossly intact, bilateral hearing aids in place.Prism eye glasses.Face is mildly masked, neck is mild to moderately rigid, airway examination reveals mild mouth dryness, no significant airway crowding, Mallampati class II, no sialorrhea. Tongue protrudes centrally and palate elevates symmetrically. Mild hypophonia, slight dysarthria noted.  Chest:Clear to auscultation without wheezing, rhonchi or crackles noted.  Heart:S1+S2+0, regular and normal without murmurs, rubs or gallops noted.   Abdomen:Soft, non-tender and non-distended with normal bowel sounds appreciated on auscultation.  Extremities:There is nopitting edema in the distal lower extremities bilaterally. Pedal pulses are intact.  Skin: Warm and dry without trophic changes noted.  Musculoskeletal: exam reveals no obvious joint deformities, tenderness or joint swelling or erythema.   Neurologically:  Mental status: The patient is awake, alert and oriented in all 4 spheres. Hisimmediate and remote memory, attention, language skills and fund of knowledge are appropriate. There is no evidence of aphasia, agnosia, apraxia or anomia.  Speech is clear with normal prosody and enunciation.Mild hypophonia, thought process is linear. Mood is normaland affect is normal.  Cranial nerves II - XII are as described above under HEENT exam. In addition: shoulder shrug is normal with equal shoulder height noted. Motor exam: Normal bulk, andstrengthis noted. He has mild increase in tone on the right side. Overall mild to moderate bradykinesia is noted, no consistent resting tremor, a slight  intermittent resting tremor is noted in the right upper extremityonly, no other resting tremor, all findings stable. Fine motor skills are mild to moderately impaired on the right and mildly impaired on the left, fairly stable. Sensory exam is intact to light touch. Cerebellar testing shows no dysmetria or intention tremor, no ataxia. Gait, station and balance: [place] with mild difficulty, he does push himself up with his hands. He has a mild lean to the left, mild to moderately stooped posture, stable. Stance is naturally slightly wide-based. He walks with decreased arm swing on the right>left. Balance is mildly impaired, but stable.  Assessment and plan:   In summary, MENASHE KAFER a very pleasant 67 year old malewith anunderlying medical history of type 2 diabetes, osteoarthritis, hypertension, obesity, hyperlipidemia, cataracts withstatus post surgeries, who presents for follow-up consultation of his right-sided predominant Parkinson's disease,complicated by mild RBD in the past, some daytime sleepiness.  he has done fairly well over the last couple of years. Nevertheless, his wife has noticed changes in his gait and there is a little bit more slowness noted, in keeping with mild progression. He is currently on Mirapex 0.75 mg 3 times a day as well as Sinemet 1 pill 3 times a day, typically at 8, 12 and 4 PM. At this juncture, I would like to increase his Sinemet to 4 times a day, namely at 8 AM, 12, 4 PM and 8 PM. He can continue with Mirapex at the current dose of 0.75 mg 3 times a day, at 8 AM, 12 and 8 PM. He was on Mirapex 1 mg 3 times a day as a monotherapy but when we started Sinemet we've reduced his Mirapex a little bit. He was also on Azilect in the past but this was discontinued in March 2018 due to cost. I renewed his prescriptions, I suggested a 6 month follow-up, sooner if needed, I answered all his questions today and he and his wife were in agreement.  I spent 25 minutes in  total face-to-face time with the patient, more than 50% of which was spent in counseling and coordination of care, reviewing test results, reviewing medication and discussing or reviewing the diagnosis of PD, its prognosis and treatment options. Pertinent laboratory and imaging test results that were available during this visit with the patient were reviewed by me and considered in my medical decision making (see chart for details).

## 2018-06-05 NOTE — Patient Instructions (Signed)
We will increase the Sinemet to 1 pill 4 times a day, at 8 AM, 12, 4 PM and 8 PM.  We will keep the Mirapex at 0.75 mg 3 times a day, at 8 AM, 12 and 8 PM.

## 2018-08-06 DIAGNOSIS — Z136 Encounter for screening for cardiovascular disorders: Secondary | ICD-10-CM | POA: Diagnosis not present

## 2018-08-06 DIAGNOSIS — E782 Mixed hyperlipidemia: Secondary | ICD-10-CM | POA: Diagnosis not present

## 2018-08-06 DIAGNOSIS — I10 Essential (primary) hypertension: Secondary | ICD-10-CM | POA: Diagnosis not present

## 2018-08-06 DIAGNOSIS — E669 Obesity, unspecified: Secondary | ICD-10-CM | POA: Diagnosis not present

## 2018-08-06 DIAGNOSIS — E119 Type 2 diabetes mellitus without complications: Secondary | ICD-10-CM | POA: Diagnosis not present

## 2018-08-06 DIAGNOSIS — G20 Parkinson's disease: Secondary | ICD-10-CM | POA: Diagnosis not present

## 2018-08-08 ENCOUNTER — Other Ambulatory Visit: Payer: Self-pay | Admitting: Physician Assistant

## 2018-08-08 DIAGNOSIS — Z136 Encounter for screening for cardiovascular disorders: Secondary | ICD-10-CM

## 2018-09-04 ENCOUNTER — Other Ambulatory Visit: Payer: Self-pay | Admitting: Physician Assistant

## 2018-09-04 DIAGNOSIS — Z136 Encounter for screening for cardiovascular disorders: Secondary | ICD-10-CM

## 2018-09-18 DIAGNOSIS — Z23 Encounter for immunization: Secondary | ICD-10-CM | POA: Diagnosis not present

## 2018-09-24 ENCOUNTER — Ambulatory Visit
Admission: RE | Admit: 2018-09-24 | Discharge: 2018-09-24 | Disposition: A | Discharge status: Home / Self Care | Payer: Medicare Other | Source: Ambulatory Visit | Attending: Physician Assistant | Admitting: Physician Assistant

## 2018-09-24 DIAGNOSIS — Z136 Encounter for screening for cardiovascular disorders: Secondary | ICD-10-CM

## 2018-09-24 DIAGNOSIS — Z87891 Personal history of nicotine dependence: Secondary | ICD-10-CM | POA: Diagnosis not present

## 2018-10-02 ENCOUNTER — Telehealth: Payer: Self-pay | Admitting: Neurology

## 2018-10-02 DIAGNOSIS — H6123 Impacted cerumen, bilateral: Secondary | ICD-10-CM | POA: Diagnosis not present

## 2018-10-02 NOTE — Telephone Encounter (Signed)
Pt wife (on [place]pr-[place]) has called re: a timeshare they have.  Wife states due to pt's diagnosis of Parkinson's it has been years since they have been able to use the Timeshare.  Wife has been told in order to cancel the time share they need a note from pt's doctor stating pt's diagnosis, how bad it is, how long he has had it.  Wife states the letter needs to state pt vision has declined so much that he is unable to drive.  Please call re: this request as it is needed as soon as possible(pt wife aware Dr Rexene Alberts is out of office this and next week).

## 2018-10-02 NOTE — Telephone Encounter (Signed)
Spoke with Danny [name]. She sts. they are not able to use their timeshare b/c  pt. is not able to drive. I have explained that Dr. Rexene Alberts is out of the office for the next 2 weeks and will address this when she returns/fim

## 2018-10-13 NOTE — Telephone Encounter (Signed)
I called pt's wife to discuss. No answer, left a message asking her to call me back. 

## 2018-10-13 NOTE — Telephone Encounter (Signed)
Please furnish a letter of support as requested by patient's wife, stating that patient has a diagnosis of Parkinson's disease. He has been a patient of mine for over 5 years, and has had a progressive decline in his mobility, and fine motor skills, rendering the patient unable to drive longer distances. In addition, he has had progressive visual decline, which has essentially left him unable to drive. For that reason, I support the patient's effort to cancel their timeshare, as he has not been able to utilize it.

## 2018-10-13 NOTE — Telephone Encounter (Signed)
Pt's wife returned my call. She asked that this letter be mailed and I confirmed that the address we have on file is accurate. Will place letter in mail today. Pt's wife verbalized understanding.

## 2018-11-06 DIAGNOSIS — R351 Nocturia: Secondary | ICD-10-CM | POA: Diagnosis not present

## 2018-11-06 DIAGNOSIS — I10 Essential (primary) hypertension: Secondary | ICD-10-CM | POA: Diagnosis not present

## 2018-11-06 DIAGNOSIS — E119 Type 2 diabetes mellitus without complications: Secondary | ICD-10-CM | POA: Diagnosis not present

## 2018-11-06 DIAGNOSIS — G20 Parkinson's disease: Secondary | ICD-10-CM | POA: Diagnosis not present

## 2018-11-06 DIAGNOSIS — E782 Mixed hyperlipidemia: Secondary | ICD-10-CM | POA: Diagnosis not present

## 2018-11-06 DIAGNOSIS — Z125 Encounter for screening for malignant neoplasm of prostate: Secondary | ICD-10-CM | POA: Diagnosis not present

## 2018-11-06 DIAGNOSIS — I77811 Abdominal aortic ectasia: Secondary | ICD-10-CM | POA: Diagnosis not present

## 2018-12-03 ENCOUNTER — Encounter: Payer: Self-pay | Admitting: Neurology

## 2018-12-03 ENCOUNTER — Ambulatory Visit (INDEPENDENT_AMBULATORY_CARE_PROVIDER_SITE_OTHER): Payer: Medicare Other | Admitting: Neurology

## 2018-12-03 VITALS — BP 116/74 | HR 97 | Ht 70.0 in | Wt 224.0 lb

## 2018-12-03 DIAGNOSIS — G20 Parkinson's disease: Secondary | ICD-10-CM | POA: Diagnosis not present

## 2018-12-03 MED ORDER — CARBIDOPA-LEVODOPA 25-100 MG PO TABS: 1.0000 | ORAL_TABLET | Freq: Four times a day (QID) | ORAL | 3 refills | Status: DC

## 2018-12-03 NOTE — Progress Notes (Signed)
Subjective:    Patient ID: Danny [name] is a 67 y.o. male.  HPI     Interim history:   Danny [name] is a very pleasant 67 year old right-handed gentleman with an underlying medical history of type 2 diabetes, osteoarthritis, hypertension, obesity, hyperlipidemia, and cataracts, who presents for follow-up consultation of his Parkinson's disease, associated with a history of RBD. The patient is accompanied by his wife again today. I last saw him on 06/05/2018, at which time he felt fairly stable but his wife had noticed more shuffling and also some mood irritability. He had no falls thankfully and some forgetfulness was noted. He was more sleepy during the day. Suggested he continue with generic Mirapex 0.75 mg 3 times a day but increase his Sinemet from 3 times a day to 1 pill 4 times a day.  His wife called in the interim in October 2019 requesting a letter to support their cancellation of their timeshare as he was not able to drive and they were not able to utilize their timeshare.   Today, 12/03/2018 (all dictated new, as well as above notes, some dictation done in note pad or Word, outside of chart, may appear as copied):    He reports doing okay, she has noted no significant improvement in the shuffling, with the increase in the C/L to 4 pills a day. He seems to tolerate it well, denies any headache, nausea, lightheadedness and no falls with the exception of one time where he was backing into his chair and missed the chair, landed on the floor, thankfully no injuries. Memory is stable, has had some mild forgetfulness, we will do an MMSE next time. He has had no significant mood related issues with the exception of occasional frustration and mood irritability per wife's report. He does not always exercise as much, he does like to still go to square dancing and bowling. He has had worsening vision and has an appointment with his ophthalmologist soon. He does not always hydrate well with water.  Takes his medication only with sips of water. Weight has been stable, appetite good.   The patient's allergies, current medications, family history, past medical history, past social history, past surgical history and problem list were reviewed and updated as appropriate.    I saw him on 12/05/2017, at which time he felt fairly stable on Sinemet, we had reduced his Mirapex to 0.75 mg 3 times a day. He is trying to stay active. He had no recent falls. I suggested we continue with his Sinemet and Mirapex at the current doses.     Previously (copied from previous notes for reference):    I saw him on 09/09/2017 at which time he had been more sleepy during the day. His wife was worried that he was more preoccupied with sex. He had ongoing issues with intermittent double vision. He had intermittent and mild dream enactments. Suggested we gradually start him on low-dose Sinemet starting with half pill and gradually increasing this to 1 pill 3 times a day. His wife called in the interim in October reporting that he had done fairly well with Sinemet, at which time we reduced his Mirapex from 1 mg 3 times a day to 0.75 mg 3 times a day.      I saw him on 03/04/2017, at which time he felt stable. He retired in December 2017. The daughter was living with them. Mother-in-law had moved in as well. His wife retired also. He was going to the gym on  a regular basis. Memory-wise, he had mild forgetfulness, no mood issues, no sleep issues, no side effects from the Mirapex. He had no recent falls thankfully. He had some mild dream enactments but no major issues. Looking back, his symptoms of peak date back to 2014, he had a sleep study in early 2014 with PLMS noted but no OSA and he denies restless leg symptoms. We mutually agreed to taper him off of Azilect as it was too expensive. I suggested in lieu of the Azilect, we increase his pramipexole to 1 mg 3 times a day.     I saw him on 09/03/2016, at which time he  reported doing well, no telltale differences after increasing the Mirapex. He gave up driving. He decided to retire by the end of December 2017. His mother-in-law was supposed to move in with them. His memory was stable, sleep was stable, no real mood issues, no recent falls. I suggested we keep his medications the same including Azilect 1 mg once daily, Mirapex 0.75 mg 3 times a day.   I saw him on 02/27/2016, at which time he reported more difficulty with his walking, he had become slower. Wife had noted that it would take him longer to do things. He was dragging his feet at times. He was not swinging his arms as well. He was working full-time as a Land, avoiding stairs and heights. His A1c in December 2016 was a little up at 7.6. Memory was stable, with some mild forgetfulness. Mood was stable, RBD was infrequent. He reported no recent falls. I suggested he continue with Azilect and we increased his Mirapex to 0.75 mg 3 times a day.    I saw him on 08/29/2015 at which time he reported doing fairly well. He was able to tolerate Azilect. His Mirapex was 0.5 mg 3 times a day. He was working at the computer most of the time. He was not always drinking enough water. He had a good appetite. He felt his memory was stable. RBD was less frequent but still occurring. His wife was able to sleep in the same bed with him. He had no recent falls. He had some recent blurry vision was going to see his ophthalmologist. We mutually agreed to continue his current medication regimen.   I saw him on 04/25/2015, at which time he reported doing fairly well overall, but had noted smaller changes, including more slowness, difficulty with climbing ladders. He was advised by his boss to not climb ladders or walk over obstacles at work. Thankfully, his work environment is understanding. He had another business trip coming up and a colleague was to drive and do the climbing on the sites. He reported mild forgetfulness  and some problems with depth perception. His diplopia had improved since he was given prism glasses by his ophthalmologist. I suggested, we try him on Azilect and keep his Mirapex the same.    I saw him on 02/04/2015, at which time he reported ongoing issues with diplopia, particularly at night while driving. He was still working full-time. He was still on low-dose Mirapex, 0.25 mg 3 times a day. He had no side effects. He had had a sleep study which per his report did not show any OSA. I asked him to make an appointment with his eye doctor. I suggested an increase in Mirapex to 0.5 mg 3 times a day.   I first met him on 12/28/2014, at which time he reported that he was having more fatigue.  However, he also had reduce his caffeine intake. He had no recent falls and mood stable. He was working full-time. He reported that he had intermittent double vision in the last 3 months. His PCP requested a sooner than scheduled appointment for diplopia. His exam was stable at the time and I suggested no new medication changes.   He has had some short-term memory issues. He works full-time. He is an Chief Financial Officer. He likes square dance but his wife has noted that he has had more problems with his nighttime driving and his squared hands. In the recent 3 months he has had some intermittent double vision at night. He has drooling at night. He rarely drinks alcohol and usually drinks 4-5 cups of coffee per day, occasional sodas. He quit smoking in 1978. Blood pressure is low for him today. It was recently noted to be trending lower and his blood pressure medication was reduced. He takes his cholesterol medication every other day.   He had a brain MRI without contrast on 11/04/2013: Normal MRI scan of the brain. Incidental findings of a chronic paranasal sinusitis with deviated nasal septum to the left.  He previously followed with Dr. Jim Like and was last seen by him on 08/30/2014, at which time his Mirapex was kept at 0.25  mg 3 times a day. He reported no side effects, in particular no impulse control disorder. I reviewed Dr. Hazle Quant notes. He first met Dr. Janann Colonel on 10/26/13, at which time the patient reported a right hand tremor noticed over the previous 4-6 months. He had changes in his handwriting, slowness, some stiffness and changes in his walking.  His wife reported a longer standing history of REM behavior disorder. He has been in outpatient physical therapy.  His Past Medical History Is Significant For: Past Medical History:  Diagnosis Date  . Cataract   . Essential hypertension, benign   . Mixed hyperlipidemia   . OA (osteoarthritis) of knee    right  . Obesity, unspecified   . Parkinsons ([place])   . Type II or unspecified type diabetes mellitus without mention of complication, not stated as uncontrolled     His Past Surgical History Is Significant For: Past Surgical History:  Procedure Laterality Date  . CATARACT EXTRACTION, BILATERAL    . GANGLION CYST EXCISION  age 47 years   right  . KNEE ARTHROSCOPY     right  . KNEE ARTHROSCOPY    . WRIST FRACTURE SURGERY    . WRIST SURGERY  04/2012    His Family History Is Significant For: Family History  Problem Relation Age of Onset  . Diabetes Sister   . Diabetes Daughter   . Cancer Father   . Heart disease Father   . Diabetes Mother     His Social History Is Significant For: Social History   Socioeconomic History  . Marital status: Married    Spouse name: [name]  . Number of children: 3  . Years of education: 16  . Highest education level: Bachelor's degree (e.g., BA, AB, BS)  Occupational History  . Occupation: Glass blower/designer  . Financial resource strain: Not hard at all  . Food insecurity:    Worry: Never true    Inability: Never true  . Transportation needs:    Medical: No    Non-medical: No  Tobacco Use  . Smoking status: Former Smoker    Packs/day: 1.50    Years: 15.00    Pack years: 22.50    Types:  Cigarettes     Last attempt to quit: 12/17/1982    Years since quitting: 35.9  . Smokeless tobacco: Never Used  . Tobacco comment: over 40 yrs  Substance and Sexual Activity  . Alcohol use: Yes    Comment: very rare  . Drug use: No  . Sexual activity: Yes    Partners: Female  Lifestyle  . Physical activity:    Days per week: 0 days    Minutes per session: 0 min  . Stress: Not at all  Relationships  . Social connections:    Talks on phone: More than three times a week    Gets together: More than three times a week    Attends religious service: More than 4 times per year    Active member of club or organization: Yes    Attends meetings of clubs or organizations: More than 4 times per year    Relationship status: Married  Other Topics Concern  . Not on file  Social History Narrative   [name], motorcycle rider.  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  He has 3 adult children.  His wife had 2 children, one of whom is deceased.   Patient is working full-time.   Patient has a Bachelor's degree.   Patient is right-handed.   Patient drinks 6 cups of coffee daily.    His Allergies Are:  Allergies  Allergen Reactions  . Codeine Nausea And Vomiting  :   His Current Medications Are:  Outpatient Encounter Medications as of 12/03/2018  Medication Sig  . aspirin 81 MG tablet Take 81 mg by mouth daily.    . carbidopa-levodopa (SINEMET IR) 25-100 MG tablet Take 1 tablet by mouth 3 (three) times daily.  . Cinnamon 500 MG capsule Take 500 mg by mouth daily.  . enalapril (VASOTEC) 2.5 MG tablet Take 1 tablet by mouth  daily (Patient taking differently: Take 2.5 mg by mouth every other day. Take 1 tablet by mouth  daily)  . Ginger, Zingiber officinalis, 550 MG CAPS Take 550 mg by mouth daily.    Marland Kitchen glipiZIDE (GLUCOTROL XL) 10 MG 24 hr tablet TAKE 1 TABLET BY MOUTH DAILY  . Glucosamine-Chondroit-Vit C-Mn (GLUCOSAMINE CHONDR 1500 COMPLX PO) Take 1,500 mg by mouth daily.    Marland Kitchen glucose blood test strip Use as  instructed  . Multiple Vitamin (MULTIVITAMIN) tablet Take 1 tablet by mouth daily.  . pramipexole (MIRAPEX) 0.75 MG tablet Take 1 tablet (0.75 mg total) by mouth 3 (three) times daily.  . ranitidine (ZANTAC) 150 MG tablet Take 1 tablet (150 mg total) by mouth 2 (two) times daily.  . simvastatin (ZOCOR) 40 MG tablet TAKE 0.5 TABLETS (20 MG TOTAL) BY MOUTH DAILY.  . sitaGLIPtin-metformin (JANUMET) 50-1000 MG tablet Take 1 tablet by mouth two  times daily with meals   No facility-administered encounter medications on file as of 12/03/2018.   :  Review of Systems:  Out of a complete 14 point review of systems, all are reviewed and negative with the exception of these symptoms as listed below: Review of Systems  Neurological:       Pt presents today for PD follow up. Pt reports that he has increased his C/L to 4 times daily. Pt reports that his eyesight has worsened. Pt's wife reports that he shuffles his feet more often.    Objective:  Neurological Exam  Physical Exam Physical Examination:   Vitals:   12/03/18 1259  BP: 116/74  Pulse: 97  General Examination: The patient is a very pleasant 67 y.o. male in no acute distress. He appears well-developed and well-nourished and well groomed.   HEENT:Normocephalic, atraumatic, pupils are equal, round and reactive to light and accommodation. Extraocular trackingshows mild saccadic breakdown, no nystagmus, no significant limitation to downgaze, slight limitation to upper gaze. He has mild decrease in eye blink rate, status post bilateral cataract repairs, hearing is grossly intact, bilateral hearing aids in place.Prism eye glasses in place.Face is mildly masked, neck is mild to moderately rigid, airway examination reveals mild mouth dryness, no significant airway crowding, Mallampati class II, no sialorrhea. Tongue protrudes centrally and palate elevates symmetrically. Mild hypophonia, with mild dysarthria noted.  Chest:Clear to  auscultation without wheezing, rhonchi or crackles noted.  Heart:S1+S2+0, regular and normal without murmurs, rubs or gallops noted.   Abdomen:Soft, non-tender and non-distended with normal bowel sounds appreciated on auscultation.  Extremities:There is nopitting edema in the distal lower extremities bilaterally, with the exception of mild R ankle swelling today.   Skin: Warm and dry without trophic changes noted.  Musculoskeletal: exam reveals no obvious joint deformities, tenderness or joint swelling or erythema.   Neurologically:  Mental status: The patient is awake, alert and oriented in all 4 spheres. Hisimmediate and remote memory, attention, language skills and fund of knowledge are appropriate. There is no evidence of aphasia, agnosia, apraxia or anomia. Speech is clear with normal prosody and enunciation.Mild hypophonia, thought process is linear. Mood is normaland affect is normal.  Cranial nerves II - XII are as described above under HEENT exam. In addition: shoulder shrug is normal with equal shoulder height noted. Motor exam: Normal bulk, andstrengthis noted. He has mild increase in tone on the right side. Overall mild to moderate bradykinesia is noted, no consistent resting tremor, a slight intermittent resting tremor is noted in the right upper extremityonly, no other resting tremor, all findings stable. Fine motor skills are moderately impaired on the right and mild to moderately impaired on the left.  Sensory exam is intact to light touch.  Cerebellar testing shows no dysmetria or intention tremor, no ataxia. Gait, station and balance: Hestands with mild difficulty, he does push himself up with his hands. He has a mild lean to the left, mild to moderately stooped posture, stable. Stance is naturally slightly wide-based. He walks with decreased arm swing bilaterally on the right>left. Balance is mildly impaired, but stable.  Assessment and plan:   In summary,  Danny [name] a very pleasant 67 year old malewith anunderlying medical history of type 2 diabetes, osteoarthritis, hypertension, obesity, hyperlipidemia, cataracts withstatus post surgeries, who presents for follow-up consultation of his right-sided predominant Parkinson's disease,complicated by mild RBDin the past, some daytimesleepiness, mild memory loss with Sx dating back to around 5/14 and RBD symptoms preceding motor symptoms by perhaps 12-15 years even. He has done fairly well over the last few years. Nevertheless, he has had some progression. His wife has noticed or shuffling, more posture changes, and more slowness, it takes him a long time to get dressed. I reduced his Mirapex from 1 mg 3 times a day to 0.75 mg 3 times a day (primarily d/t preoccupation with sex in the past, which had been disturbing to his wife) and he has done well with the generic Mirapex 0.75 mg 3 times a day. I increased his Sinemet from 1 pill 3 times a day to 1 pill 4 times a day, he has had no telltale improvement after increasing it but feels overall quite  stable, has not fallen thankfully, has had some mild memory issues and we will do a Mini-Mental state exam next time. He is reminded to be proactive about constipation issues and hydrate better with water as he tends to under hydrate with water. He is furthermore advised to get back on a more formal exercise regimen. He was also on Azilect in the past but this was discontinued in March 2018 due to cost. I renewed his Sinemet prescription, he did not need a refill on the Mirapex. I suggested a 6 month follow-up, sooner if needed. I answered all their questions today and the patient and his wife were in agreement.

## 2018-12-03 NOTE — Patient Instructions (Addendum)
Please be really proactive with your constipation medication regimen, titrating as needed to where you have a formed stool at least every other day.   Please drink more water.   Please try to exercise regularly, use your cane for safety.   We will keep your meds the same.

## 2019-01-20 ENCOUNTER — Ambulatory Visit: Payer: Medicare Other

## 2019-01-20 ENCOUNTER — Ambulatory Visit: Payer: Self-pay

## 2019-02-11 DIAGNOSIS — I10 Essential (primary) hypertension: Secondary | ICD-10-CM | POA: Diagnosis not present

## 2019-02-11 DIAGNOSIS — Z6832 Body mass index (BMI) 32.0-32.9, adult: Secondary | ICD-10-CM | POA: Diagnosis not present

## 2019-02-11 DIAGNOSIS — E785 Hyperlipidemia, unspecified: Secondary | ICD-10-CM | POA: Diagnosis not present

## 2019-02-11 DIAGNOSIS — I77811 Abdominal aortic ectasia: Secondary | ICD-10-CM | POA: Diagnosis not present

## 2019-02-11 DIAGNOSIS — Z0000 Encounter for general adult medical examination without abnormal findings: Secondary | ICD-10-CM | POA: Diagnosis not present

## 2019-02-11 DIAGNOSIS — E1169 Type 2 diabetes mellitus with other specified complication: Secondary | ICD-10-CM | POA: Diagnosis not present

## 2019-02-11 DIAGNOSIS — B372 Candidiasis of skin and nail: Secondary | ICD-10-CM | POA: Diagnosis not present

## 2019-02-11 DIAGNOSIS — G20 Parkinson's disease: Secondary | ICD-10-CM | POA: Diagnosis not present

## 2019-02-11 DIAGNOSIS — E1159 Type 2 diabetes mellitus with other circulatory complications: Secondary | ICD-10-CM | POA: Diagnosis not present

## 2019-05-14 DIAGNOSIS — E1169 Type 2 diabetes mellitus with other specified complication: Secondary | ICD-10-CM | POA: Diagnosis not present

## 2019-05-14 DIAGNOSIS — E119 Type 2 diabetes mellitus without complications: Secondary | ICD-10-CM | POA: Diagnosis not present

## 2019-05-14 DIAGNOSIS — R5383 Other fatigue: Secondary | ICD-10-CM | POA: Diagnosis not present

## 2019-05-14 DIAGNOSIS — E785 Hyperlipidemia, unspecified: Secondary | ICD-10-CM | POA: Diagnosis not present

## 2019-05-14 DIAGNOSIS — I10 Essential (primary) hypertension: Secondary | ICD-10-CM | POA: Diagnosis not present

## 2019-05-14 DIAGNOSIS — K30 Functional dyspepsia: Secondary | ICD-10-CM | POA: Diagnosis not present

## 2019-05-14 DIAGNOSIS — E1159 Type 2 diabetes mellitus with other circulatory complications: Secondary | ICD-10-CM | POA: Diagnosis not present

## 2019-05-28 ENCOUNTER — Telehealth: Payer: Self-pay | Admitting: Neurology

## 2019-05-28 NOTE — Telephone Encounter (Signed)
Due to current COVID 19 pandemic, our office is severely reducing in office visits until further notice, in order to minimize the risk to our patients and healthcare providers.   Called patient and offered a virtual visit for his 6/18 appt. Patient declined and prefers in office appt. I explained the precautions we are taking for in office visits. Patient verbalized understanding, and is aware that upon arrival he will need to park next to entrance and wait for instructions from a staff member.

## 2019-06-01 IMAGING — DX DG SHOULDER 2+V*L*
2 series · 2 of 2 positions shown · non-contrast
Comparison: None.

CLINICAL DATA: Shoulder pain.  Unspecified chronicity.

EXAM:
LEFT SHOULDER - 2+ VIEW

[shoulder ap]
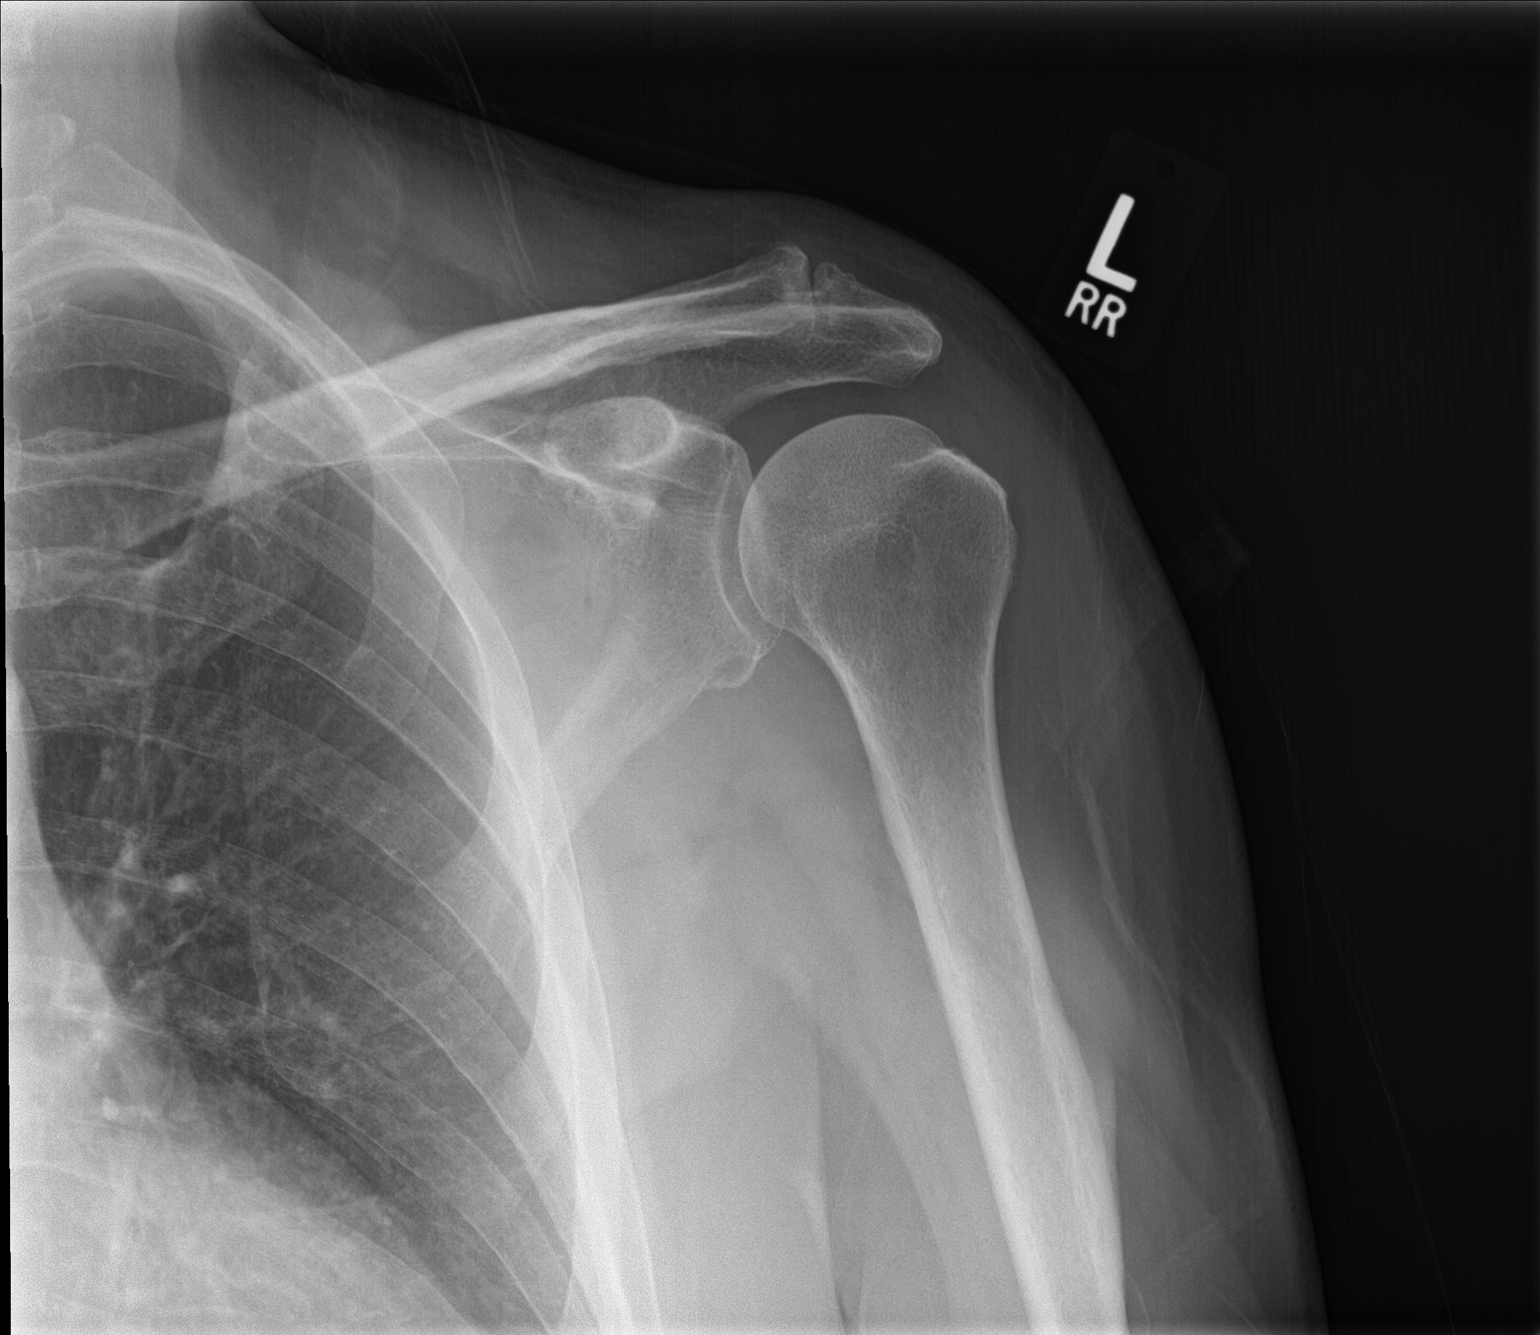

[shoulder y-view]
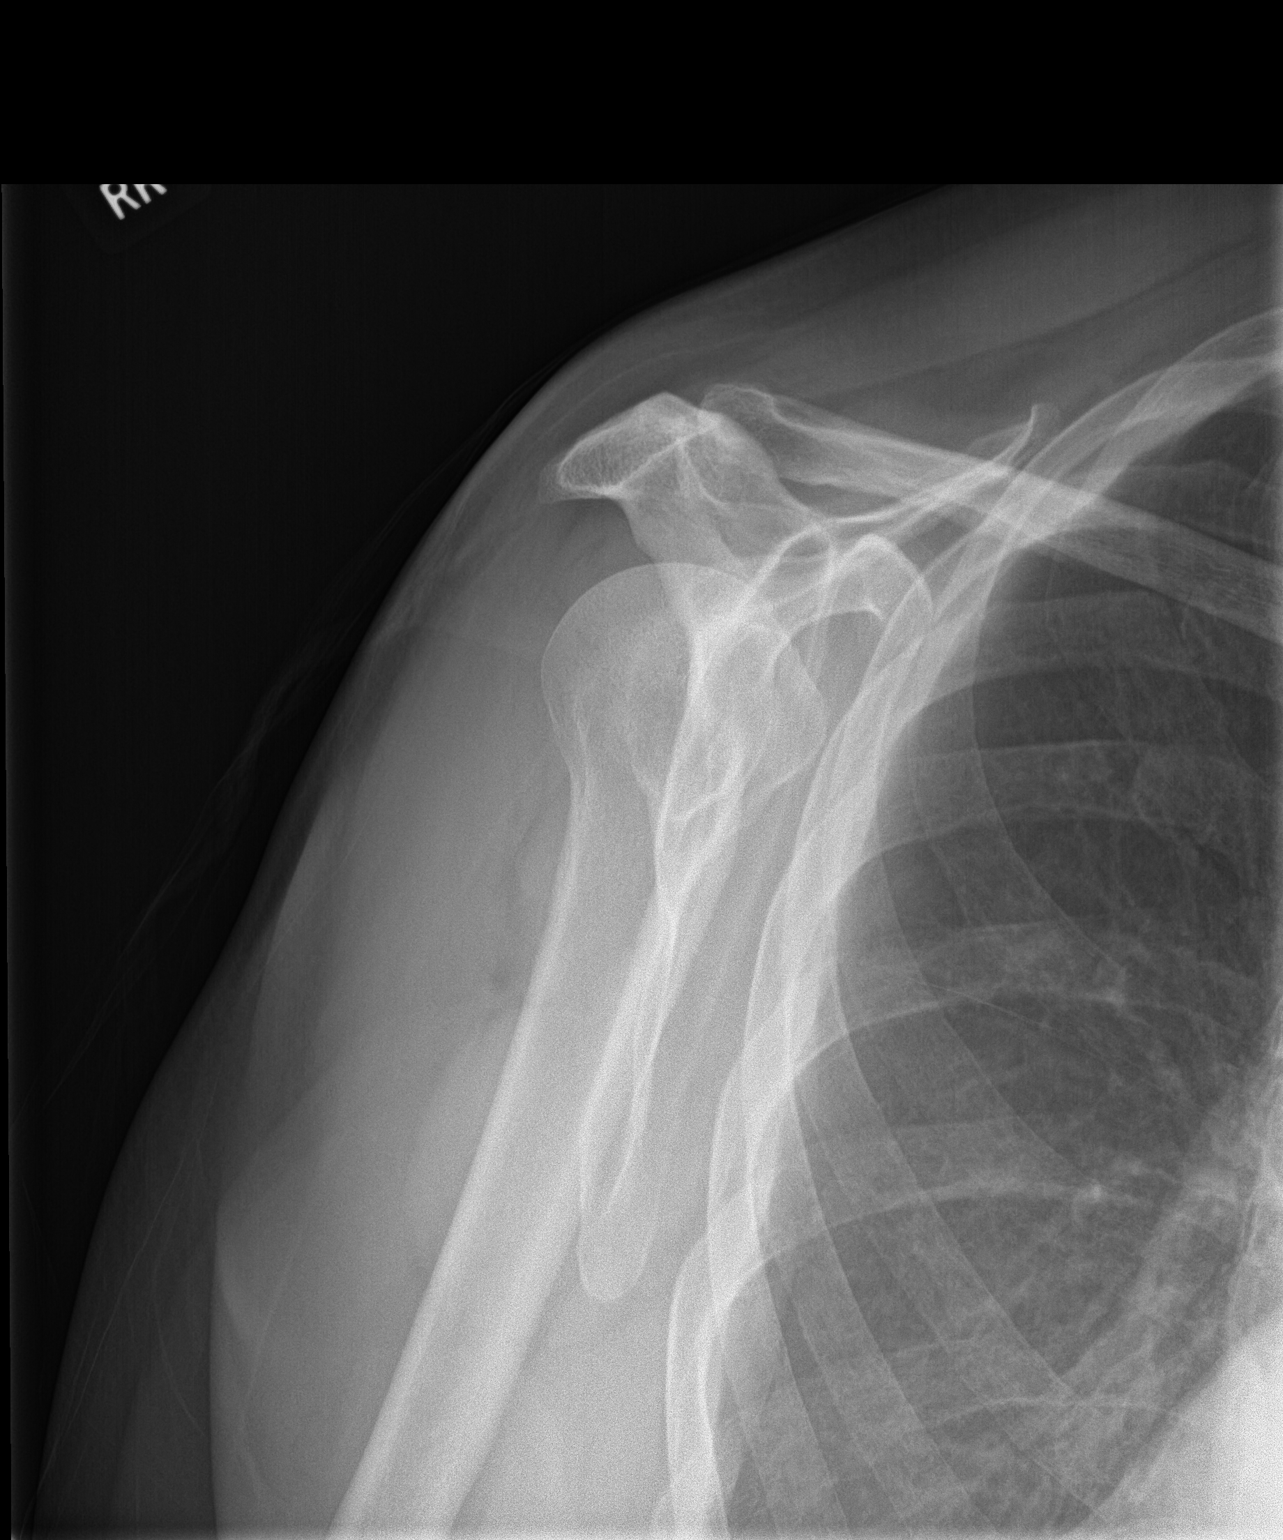

[2 of 2 positions shown; findings below may reference images not displayed]

FINDINGS: Left shoulder is located. Degenerative changes are present at the
left AC joint. No acute or healing fracture is present. Left
hemithorax is clear.
IMPRESSION: 1. Degenerative changes of the left AC joint.
2. No other acute or focal abnormality.

## 2019-06-04 ENCOUNTER — Other Ambulatory Visit: Payer: Self-pay

## 2019-06-04 ENCOUNTER — Ambulatory Visit (INDEPENDENT_AMBULATORY_CARE_PROVIDER_SITE_OTHER): Payer: Medicare Other | Admitting: Neurology

## 2019-06-04 ENCOUNTER — Encounter: Payer: Self-pay | Admitting: Neurology

## 2019-06-04 VITALS — BP 123/77 | HR 99 | Ht 70.0 in | Wt 223.0 lb

## 2019-06-04 DIAGNOSIS — R6889 Other general symptoms and signs: Secondary | ICD-10-CM

## 2019-06-04 DIAGNOSIS — R441 Visual hallucinations: Secondary | ICD-10-CM

## 2019-06-04 DIAGNOSIS — G20 Parkinson's disease: Secondary | ICD-10-CM | POA: Diagnosis not present

## 2019-06-04 DIAGNOSIS — K5909 Other constipation: Secondary | ICD-10-CM

## 2019-06-04 MED ORDER — CARBIDOPA-LEVODOPA 25-100 MG PO TABS: 1.0000 | ORAL_TABLET | Freq: Every day | ORAL | 3 refills | Status: DC

## 2019-06-04 MED ORDER — PRAMIPEXOLE DIHYDROCHLORIDE 0.75 MG PO TABS: 0.7500 mg | ORAL_TABLET | Freq: Three times a day (TID) | ORAL | 3 refills | Status: DC

## 2019-06-04 NOTE — Patient Instructions (Signed)
Please be proactive about constipation issues, try to hydrate well with water, drink 6 to 8 cups of water per day, try to stay active physically.  Use your walker or cane for safety.  As discussed we will increase your Sinemet to 1 pill 5 times a day at 8 AM, 11 AM, 2 PM, 5 PM and 8 PM.  Please continue with pramipexole 3 times a day at 8 AM, 2 PM and 8 PM.  We will monitor your memory.  Follow-up in 4 months.

## 2019-06-04 NOTE — Progress Notes (Signed)
Subjective:    Patient ID: Danny [name] is a 68 y.o. male.  HPI     Interim history:   Danny [name] is a very pleasant 68 year old right-handed gentleman with an underlying medical history of type 2 diabetes, osteoarthritis, hypertension, obesity, hyperlipidemia, and cataracts, who presents for follow-up consultation of his Parkinson's disease, associated with RBD and mild forgetfulness.  He is accompanied by his wife again today. I last saw him on 12/03/2018, at which time he was advised to continue with Sinemet 1 pill 4 times a day.  He did have her fall incident after he missed the chair when sitting down.  His memory was stable for the most part.  He was advised to hydrate well and be proactive about constipation issues, we talked about fall prevention.  Today, 06/04/2019: He reports Feeling a little worse as far as his mobility.  He has fallen a couple of times, mostly with turns, he tends to turn too quickly at times. His vision is not as good.  He has a cane.  They do have a walker available from his mother.  He is forgetful and sometimes more confused especially in the evenings.  He has more fatigue and tends to be sleepy during the day at times.  He takes carbidopa-levodopa 4 times a day at 8, 12, 4 PM and 8 PM.  It is difficult for him to get moving in the mornings.  Continues to take Mirapex 3 times a day.  He has had intermittent worsening of constipation, he takes MiraLAX as needed.  Sometimes he has gone 2 or 3 days without a bowel movement.  He has had fleeting hallucinations, nothing disturbing or anxiety provoking.   The patient's allergies, current medications, family history, past medical history, past social history, past surgical history and problem list were reviewed and updated as appropriate.      Previously (copied from previous notes for reference):    I saw him on 06/05/2018, at which time he felt fairly stable but his wife had noticed more shuffling and also some  mood irritability. He had no falls thankfully and some forgetfulness was noted. He was more sleepy during the day. Suggested he continue with generic Mirapex 0.75 mg 3 times a day but increase his Sinemet from 3 times a day to 1 pill 4 times a day.   His wife called in the interim in October 2019 requesting a letter to support their cancellation of their timeshare as he was not able to drive and they were not able to utilize their timeshare.    I saw him on 12/05/2017, at which time he felt fairly stable on Sinemet, we had reduced his Mirapex to 0.75 mg 3 times a day. He is trying to stay active. He had no recent falls. I suggested we continue with his Sinemet and Mirapex at the current doses.       I saw him on 09/09/2017 at which time he had been more sleepy during the day. His wife was worried that he was more preoccupied with sex. He had ongoing issues with intermittent double vision. He had intermittent and mild dream enactments. Suggested we gradually start him on low-dose Sinemet starting with half pill and gradually increasing this to 1 pill 3 times a day. His wife called in the interim in October reporting that he had done fairly well with Sinemet, at which time we reduced his Mirapex from 1 mg 3 times a day to 0.75 mg 3 times  a day.      I saw him on 03/04/2017, at which time he felt stable. He retired in December 2017. The daughter was living with them. Mother-in-law had moved in as well. His wife retired also. He was going to the gym on a regular basis. Memory-wise, he had mild forgetfulness, no mood issues, no sleep issues, no side effects from the Mirapex. He had no recent falls thankfully. He had some mild dream enactments but no major issues. Looking back, his symptoms of peak date back to 2014, he had a sleep study in early 2014 with PLMS noted but no OSA and he denies restless leg symptoms. We mutually agreed to taper him off of Azilect as it was too expensive. I suggested in lieu of the  Azilect, we increase his pramipexole to 1 mg 3 times a day.     I saw him on 09/03/2016, at which time he reported doing well, no telltale differences after increasing the Mirapex. He gave up driving. He decided to retire by the end of December 2017. His mother-in-law was supposed to move in with them. His memory was stable, sleep was stable, no real mood issues, no recent falls. I suggested we keep his medications the same including Azilect 1 mg once daily, Mirapex 0.75 mg 3 times a day.   I saw him on 02/27/2016, at which time he reported more difficulty with his walking, he had become slower. Wife had noted that it would take him longer to do things. He was dragging his feet at times. He was not swinging his arms as well. He was working full-time as a Land, avoiding stairs and heights. His A1c in December 2016 was a little up at 7.6. Memory was stable, with some mild forgetfulness. Mood was stable, RBD was infrequent. He reported no recent falls. I suggested he continue with Azilect and we increased his Mirapex to 0.75 mg 3 times a day.    I saw him on 08/29/2015 at which time he reported doing fairly well. He was able to tolerate Azilect. His Mirapex was 0.5 mg 3 times a day. He was working at the computer most of the time. He was not always drinking enough water. He had a good appetite. He felt his memory was stable. RBD was less frequent but still occurring. His wife was able to sleep in the same bed with him. He had no recent falls. He had some recent blurry vision was going to see his ophthalmologist. We mutually agreed to continue his current medication regimen.   I saw him on 04/25/2015, at which time he reported doing fairly well overall, but had noted smaller changes, including more slowness, difficulty with climbing ladders. He was advised by his boss to not climb ladders or walk over obstacles at work. Thankfully, his work environment is understanding. He had another business  trip coming up and a colleague was to drive and do the climbing on the sites. He reported mild forgetfulness and some problems with depth perception. His diplopia had improved since he was given prism glasses by his ophthalmologist. I suggested, we try him on Azilect and keep his Mirapex the same.    I saw him on 02/04/2015, at which time he reported ongoing issues with diplopia, particularly at night while driving. He was still working full-time. He was still on low-dose Mirapex, 0.25 mg 3 times a day. He had no side effects. He had had a sleep study which per his report did not  show any OSA. I asked him to make an appointment with his eye doctor. I suggested an increase in Mirapex to 0.5 mg 3 times a day.   I first met him on 12/28/2014, at which time he reported that he was having more fatigue. However, he also had reduce his caffeine intake. He had no recent falls and mood stable. He was working full-time. He reported that he had intermittent double vision in the last 3 months. His PCP requested a sooner than scheduled appointment for diplopia. His exam was stable at the time and I suggested no new medication changes.   He has had some short-term memory issues. He works full-time. He is an Chief Financial Officer. He likes square dance but his wife has noted that he has had more problems with his nighttime driving and his squared hands. In the recent 3 months he has had some intermittent double vision at night. He has drooling at night. He rarely drinks alcohol and usually drinks 4-5 cups of coffee per day, occasional sodas. He quit smoking in 1978. Blood pressure is low for him today. It was recently noted to be trending lower and his blood pressure medication was reduced. He takes his cholesterol medication every other day.   He had a brain MRI without contrast on 11/04/2013: Normal MRI scan of the brain. Incidental findings of a chronic paranasal sinusitis with deviated nasal septum to the left.  He previously  followed with Dr. Jim Like and was last seen by him on 08/30/2014, at which time his Mirapex was kept at 0.25 mg 3 times a day. He reported no side effects, in particular no impulse control disorder. I reviewed Dr. Hazle Quant notes. He first met Dr. Janann Colonel on 10/26/13, at which time the patient reported a right hand tremor noticed over the previous 4-6 months. He had changes in his handwriting, slowness, some stiffness and changes in his walking.  His wife reported a longer standing history of REM behavior disorder. He has been in outpatient physical therapy.  His Past Medical History Is Significant For: Past Medical History:  Diagnosis Date  . Cataract   . Essential hypertension, benign   . Mixed hyperlipidemia   . OA (osteoarthritis) of knee    right  . Obesity, unspecified   . Parkinsons ([place])   . Type II or unspecified type diabetes mellitus without mention of complication, not stated as uncontrolled     His Past Surgical History Is Significant For: Past Surgical History:  Procedure Laterality Date  . CATARACT EXTRACTION, BILATERAL    . GANGLION CYST EXCISION  age 40 years   right  . KNEE ARTHROSCOPY     right  . KNEE ARTHROSCOPY    . WRIST FRACTURE SURGERY    . WRIST SURGERY  04/2012    His Family History Is Significant For: Family History  Problem Relation Age of Onset  . Diabetes Sister   . Diabetes Daughter   . Cancer Father   . Heart disease Father   . Diabetes Mother     His Social History Is Significant For: Social History   Socioeconomic History  . Marital status: Married    Spouse name: [name]  . Number of children: 3  . Years of education: 16  . Highest education level: Bachelor's degree (e.g., BA, AB, BS)  Occupational History  . Occupation: Glass blower/designer  . Financial resource strain: Not hard at all  . Food insecurity    Worry: Never true  Inability: Never true  . Transportation needs    Medical: No    Non-medical: No  Tobacco Use  .  Smoking status: Former Smoker    Packs/day: 1.50    Years: 15.00    Pack years: 22.50    Types: Cigarettes    Quit date: 12/17/1982    Years since quitting: 36.4  . Smokeless tobacco: Never Used  . Tobacco comment: over 40 yrs  Substance and Sexual Activity  . Alcohol use: Yes    Comment: very rare  . Drug use: No  . Sexual activity: Yes    Partners: Female  Lifestyle  . Physical activity    Days per week: 0 days    Minutes per session: 0 min  . Stress: Not at all  Relationships  . Social connections    Talks on phone: More than three times a week    Gets together: More than three times a week    Attends religious service: More than 4 times per year    Active member of club or organization: Yes    Attends meetings of clubs or organizations: More than 4 times per year    Relationship status: Married  Other Topics Concern  . Not on file  Social History Narrative   [name], motorcycle rider.  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  He has 3 adult children.  His wife had 2 children, one of whom is deceased.   Patient is working full-time.   Patient has a Bachelor's degree.   Patient is right-handed.   Patient drinks 6 cups of coffee daily.    His Allergies Are:  Allergies  Allergen Reactions  . Codeine Nausea And Vomiting  :   His Current Medications Are:  Outpatient Encounter Medications as of 06/04/2019  Medication Sig  . aspirin 81 MG tablet Take 81 mg by mouth daily.    . carbidopa-levodopa (SINEMET IR) 25-100 MG tablet Take 1 tablet by mouth 4 (four) times daily.  . Cinnamon 500 MG capsule Take 500 mg by mouth daily.  . enalapril (VASOTEC) 2.5 MG tablet Take 1 tablet by mouth  daily (Patient taking differently: Take 2.5 mg by mouth every other day. Take 1 tablet by mouth  daily)  . FAMOTIDINE PO Take by mouth.  . Ginger, Zingiber officinalis, 550 MG CAPS Take 550 mg by mouth daily.    Marland Kitchen glipiZIDE (GLUCOTROL XL) 10 MG 24 hr tablet TAKE 1 TABLET BY MOUTH DAILY  .  Glucosamine-Chondroit-Vit C-Mn (GLUCOSAMINE CHONDR 1500 COMPLX PO) Take 1,500 mg by mouth daily.    Marland Kitchen glucose blood test strip Use as instructed  . Multiple Vitamin (MULTIVITAMIN) tablet Take 1 tablet by mouth daily.  . pramipexole (MIRAPEX) 0.75 MG tablet Take 1 tablet (0.75 mg total) by mouth 3 (three) times daily.  . simvastatin (ZOCOR) 40 MG tablet TAKE 0.5 TABLETS (20 MG TOTAL) BY MOUTH DAILY.  . sitaGLIPtin-metformin (JANUMET) 50-1000 MG tablet Take 1 tablet by mouth two  times daily with meals  . [DISCONTINUED] ranitidine (ZANTAC) 150 MG tablet Take 1 tablet (150 mg total) by mouth 2 (two) times daily.   No facility-administered encounter medications on file as of 06/04/2019.   :  Review of Systems:  Out of a complete 14 point review of systems, all are reviewed and negative with the exception of these symptoms as listed below: Review of Systems  Neurological:       Pt presents today to discuss his PD. Pt's wife believes that  pt has worsened.    Objective:  Neurological Exam  Physical Exam Physical Examination:   Vitals:   06/04/19 1358  BP: 123/77  Pulse: 99    General Examination: The patient is a very pleasant 68 y.o. male in no acute distress. He appears well-developed and well-nourished and well groomed. Good spirits.   HEENT:Normocephalic, atraumatic, pupils are equal, round and reactive to light and accommodation. Extraocular trackingshows mild saccadic breakdown, no nystagmus, no significant limitation to downgaze, slight limitation to upper gaze. He has mild decrease in eye blink rate, status post bilateral cataract repairs, hearing is grossly intact, bilateral hearing aids in place.Prism eye glasses in place.Face is moderately masked, neck is mild to moderately rigid, airway examination reveals mild mouth dryness, no significant airway crowding, Mallampati class II, no sialorrhea. Tongue protrudes centrally and palate elevates symmetrically. Mild to moderate  hypophonia, with milddysarthria noted.  Chest:Clear to auscultation without wheezing, rhonchi or crackles noted.  Heart:S1+S2+0, regular and normal without murmurs, rubs or gallops noted.   Abdomen:Soft, non-tender and non-distended with normal bowel sounds appreciated on auscultation.  Extremities:There is nopitting edema in the distal lower extremities bilaterally.   Skin: Warm and dry without trophic changes noted.  Musculoskeletal: exam reveals no obvious joint deformities, tenderness or joint swelling or erythema.   Neurologically:  Mental status: The patient is awake, alert and oriented in all 4 spheres. Hisimmediate and remote memory, attention, language skills and fund of knowledge are mildly impaired. There is no evidence of aphasia, agnosia, apraxia or anomia. Speech with mild hypophonia, thought process is linear. Mood is normaland affect is normal.   On 06/04/2019: MMSE: 25/30, CDT: 2/4, AFT: 14/min Cranial nerves II - XII are as described above under HEENT exam. In addition: shoulder shrug is normal with equal shoulder height noted. Motor exam: Normal bulk, andstrengthis noted. He has mild increase in tone on the right side. Overall mildto moderatebradykinesia is noted, no consistent resting tremor, a slight intermittent resting tremor is noted in the right upper extremityonly, no other resting tremor, all findings stable. Fine motor skills are moderately impaired on the right and mild to moderately impaired on the left.  Sensory exam is intact to light touch.  Cerebellar testing shows no dysmetria or intention tremor, no ataxia. Gait, station and balance: Hestands with mild difficulty, he does push himself up with his hands. He has a mild lean to the left, mild to moderately stooped posture,stable.Stance is naturally slightly wide-based. He walks with decreased arm swing bilaterally on the right>left. Balance is mildly impaired, but stable. He has a single  point cane.  Assessment and plan:   In summary, ALEEM ELZA a very pleasant 68 year old malewith anunderlying medical history of type 2 diabetes, osteoarthritis, hypertension, obesity, hyperlipidemia, cataracts withstatus post surgeries, who presents for follow-up consultation of his right-sided predominant Parkinson's disease,complicated by mild RBDin the past, some daytimesleepiness, mild memory loss with Sx dating back to around 5/14 and RBD symptoms preceding motor symptoms by perhaps 12-15 years even. He has done fairly well over the last few years. Nevertheless, he has had some progression. He has had some falls, thankfully without major injuries.  His wife has noted more memory issues and confusion especially later in the day and more fatigue. His wife has noticed or shuffling, more posture changes, and more slowness, it takes him a long time to get dressed. I reduced his Mirapex from 1 mg 3 times a day to 0.75 mg 3 times a day (primarily d/t  preoccupation with sex in the past, which had been disturbing to his wife) and he has done well with the generic Mirapex 0.75 mg 3 times a day. I increased his Sinemet from 1 pill 3 times a day to 1 pill 4 times a day. We will monitor his memory complaints.  We will monitor his memory scores as well.  He is encouraged to stay well-hydrated and be really proactive about constipation issues, adding MiraLAX even Daily or every other day if needed.  We will try to increase the Sinemet to 1 pill 5 times a day at this point, every 3 hours starting at 8:00 in the morning.  He is advised to continue with Mirapex 3 times a day at the current dose.  Prescriptions were renewed today. He was also on Azilect in the past but this was discontinued in March 2018 due to cost. I suggested a 4 month follow-up, sooner if needed. I answered all their questions today and the patient and his wife were in agreement. I spent 30 minutes in total face-to-face time with the  patient, more than 50% of which was spent in counseling and coordination of care, reviewing test results, reviewing medication and discussing or reviewing the diagnosis of PD its prognosis and treatment options. Pertinent laboratory and imaging test results that were available during this visit with the patient were reviewed by me and considered in my medical decision making (see chart for details).

## 2019-08-12 DIAGNOSIS — G20 Parkinson's disease: Secondary | ICD-10-CM | POA: Diagnosis not present

## 2019-08-12 DIAGNOSIS — E785 Hyperlipidemia, unspecified: Secondary | ICD-10-CM | POA: Diagnosis not present

## 2019-08-12 DIAGNOSIS — Z1211 Encounter for screening for malignant neoplasm of colon: Secondary | ICD-10-CM | POA: Diagnosis not present

## 2019-08-12 DIAGNOSIS — S61211A Laceration without foreign body of left index finger without damage to nail, initial encounter: Secondary | ICD-10-CM | POA: Diagnosis not present

## 2019-08-12 DIAGNOSIS — E119 Type 2 diabetes mellitus without complications: Secondary | ICD-10-CM | POA: Diagnosis not present

## 2019-08-12 DIAGNOSIS — E1169 Type 2 diabetes mellitus with other specified complication: Secondary | ICD-10-CM | POA: Diagnosis not present

## 2019-08-12 DIAGNOSIS — I10 Essential (primary) hypertension: Secondary | ICD-10-CM | POA: Diagnosis not present

## 2019-08-12 DIAGNOSIS — Z23 Encounter for immunization: Secondary | ICD-10-CM | POA: Diagnosis not present

## 2019-08-12 DIAGNOSIS — E1159 Type 2 diabetes mellitus with other circulatory complications: Secondary | ICD-10-CM | POA: Diagnosis not present

## 2019-08-17 ENCOUNTER — Ambulatory Visit (AMBULATORY_SURGERY_CENTER): Payer: Self-pay | Admitting: *Deleted

## 2019-08-17 ENCOUNTER — Other Ambulatory Visit: Payer: Self-pay

## 2019-08-17 VITALS — Temp 97.5°F | Ht 70.0 in | Wt 224.4 lb

## 2019-08-17 DIAGNOSIS — Z1211 Encounter for screening for malignant neoplasm of colon: Secondary | ICD-10-CM

## 2019-08-17 NOTE — Progress Notes (Signed)
No egg or soy allergy known to patient  No issues with past sedation with any surgeries  or procedures, no intubation problems  No diet pills per patient No home 02 use per patient  No blood thinners per patient  Pt denies issues with constipation  No A fib or A flutter  EMMI video sent to pt's e mail  

## 2019-08-26 ENCOUNTER — Telehealth: Payer: Self-pay

## 2019-08-26 DIAGNOSIS — E119 Type 2 diabetes mellitus without complications: Secondary | ICD-10-CM | POA: Diagnosis not present

## 2019-08-26 NOTE — Telephone Encounter (Signed)
Covid-19 screening questions   Do you now or have you had a fever in the last 14 days?  Do you have any respiratory symptoms of shortness of breath or cough now or in the last 14 days?  Do you have any family members or close contacts with diagnosed or suspected Covid-19 in the past 14 days?  Have you been tested for Covid-19 and found to be positive?       

## 2019-08-27 ENCOUNTER — Encounter: Payer: Self-pay | Admitting: Internal Medicine

## 2019-08-27 ENCOUNTER — Other Ambulatory Visit: Payer: Self-pay

## 2019-08-27 ENCOUNTER — Ambulatory Visit (AMBULATORY_SURGERY_CENTER): Payer: Medicare Other | Admitting: Internal Medicine

## 2019-08-27 VITALS — BP 123/83 | HR 77 | Temp 98.2°F | Resp 15 | Ht 70.0 in | Wt 224.0 lb

## 2019-08-27 DIAGNOSIS — D123 Benign neoplasm of transverse colon: Secondary | ICD-10-CM | POA: Diagnosis not present

## 2019-08-27 DIAGNOSIS — Z1211 Encounter for screening for malignant neoplasm of colon: Secondary | ICD-10-CM | POA: Diagnosis not present

## 2019-08-27 DIAGNOSIS — D122 Benign neoplasm of ascending colon: Secondary | ICD-10-CM

## 2019-08-27 MED ORDER — SODIUM CHLORIDE 0.9 % IV SOLN: 500.0000 mL | Freq: Once | INTRAVENOUS | Status: DC

## 2019-08-27 NOTE — Op Note (Signed)
[facility] Patient Name: Danny [name] Procedure Date: 08/27/2019 9:32 AM MRN: [id number] Endoscopist: Gatha Mayer , MD Age: 68 Referring MD:  Date of Birth: 08/24/1951 Gender: Male Account #: [id number] Procedure:                Colonoscopy Indications:              Screening for colorectal malignant neoplasm, Last                            colonoscopy: 2010 Medicines:                Propofol per Anesthesia, Monitored Anesthesia Care Procedure:                Pre-Anesthesia Assessment:                           - Prior to the procedure, a History and Physical                            was performed, and patient medications and                            allergies were reviewed. The patient's tolerance of                            previous anesthesia was also reviewed. The risks                            and benefits of the procedure and the sedation                            options and risks were discussed with the patient.                            All questions were answered, and informed consent                            was obtained. Prior Anticoagulants: The patient has                            taken no previous anticoagulant or antiplatelet                            agents. ASA Grade Assessment: III - A patient with                            severe systemic disease. After reviewing the risks                            and benefits, the patient was deemed in                            satisfactory condition to undergo the procedure.  After obtaining informed consent, the colonoscope                            was passed under direct vision. Throughout the                            procedure, the patient's blood pressure, pulse, and                            oxygen saturations were monitored continuously. The                            Colonoscope was introduced through the anus and                            advanced to the  the cecum, identified by                            appendiceal orifice and ileocecal valve. The                            colonoscopy was performed without difficulty. The                            patient tolerated the procedure well. The quality                            of the bowel preparation was good. The bowel                            preparation used was Miralax via split dose                            instruction. The appendiceal orifice and the rectum                            were photographed. Scope In: 9:42:21 AM Scope Out: 9:57:50 AM Scope Withdrawal Time: 0 hours 9 minutes 54 seconds  Total Procedure Duration: 0 hours 15 minutes 29 seconds  Findings:                 The perianal and digital rectal examinations were                            normal. Pertinent negatives include normal prostate                            (size, shape, and consistency).                           A diminutive polyp was found in the transverse                            colon. The polyp was sessile. The polyp was removed  with a cold snare. Resection and retrieval were                            complete. Verification of patient identification                            for the specimen was done. Estimated blood loss was                            minimal.                           The exam was otherwise without abnormality on                            direct and retroflexion views. Complications:            No immediate complications. Estimated Blood Loss:     Estimated blood loss was minimal. Impression:               - One diminutive polyp in the transverse colon,                            removed with a cold snare. Resected and retrieved.                           - The examination was otherwise normal on direct                            and retroflexion views. Recommendation:           - Patient has a contact number available for                             emergencies. The signs and symptoms of potential                            delayed complications were discussed with the                            patient. Return to normal activities tomorrow.                            Written discharge instructions were provided to the                            patient.                           - Resume previous diet.                           - Continue present medications.                           - Repeat colonoscopy may be recommended. The  colonoscopy date will be determined after pathology                            results from today's exam become available for                            review. Gatha Mayer, MD 08/27/2019 10:03:47 AM This report has been signed electronically.

## 2019-08-27 NOTE — Patient Instructions (Addendum)
I found and removed one tiny polyp.  I will let you know pathology results and when to have another routine colonoscopy by mail and/or My Chart.  I appreciate the opportunity to care for you. Gatha Mayer, MD, FACG YOU HAD AN ENDOSCOPIC PROCEDURE TODAY AT [facility]:   Refer to the procedure report that was given to you for any specific questions about what was found during the examination.  If the procedure report does not answer your questions, please call your gastroenterologist to clarify.  If you requested that your care partner not be given the details of your procedure findings, then the procedure report has been included in a sealed envelope for you to review at your convenience later.  YOU SHOULD EXPECT: Some feelings of bloating in the abdomen. Passage of more gas than usual.  Walking can help get rid of the air that was put into your GI tract during the procedure and reduce the bloating. If you had a lower endoscopy (such as a colonoscopy or flexible sigmoidoscopy) you may notice spotting of blood in your stool or on the toilet paper. If you underwent a bowel prep for your procedure, you may not have a normal bowel movement for a few days.  Please Note:  You might notice some irritation and congestion in your nose or some drainage.  This is from the oxygen used during your procedure.  There is no need for concern and it should clear up in a day or so.  SYMPTOMS TO REPORT IMMEDIATELY:   Following lower endoscopy (colonoscopy or flexible sigmoidoscopy):  Excessive amounts of blood in the stool  Significant tenderness or worsening of abdominal pains  Swelling of the abdomen that is new, acute  Fever of 100F or higher   For urgent or emergent issues, a gastroenterologist can be reached at any hour by calling [phone].   DIET:  We do recommend a small meal at first, but then you may proceed to your regular diet.  Drink plenty of fluids but you should avoid  alcoholic beverages for 24 hours.  MEDICATIONS: Continue present medications.  Please see handouts given to you by your recovery nurse.  ACTIVITY:  You should plan to take it easy for the rest of today and you should NOT DRIVE or use heavy machinery until tomorrow (because of the sedation medicines used during the test).    FOLLOW UP: Our staff will call the number listed on your records 48-72 hours following your procedure to check on you and address any questions or concerns that you may have regarding the information given to you following your procedure. If we do not reach you, we will leave a message.  We will attempt to reach you two times.  During this call, we will ask if you have developed any symptoms of COVID 19. If you develop any symptoms (ie: fever, flu-like symptoms, shortness of breath, cough etc.) before then, please call [phone].  If you test positive for Covid 19 in the 2 weeks post procedure, please call and report this information to [place].    If any biopsies were taken you will be contacted by phone or by letter within the next 1-3 weeks.  Please call us at [phone] if you have not heard about the biopsies in 3 weeks.   Thank you for allowing [place] to provide for your healthcare needs today.   SIGNATURES/CONFIDENTIALITY: You and/or your care partner have signed paperwork which will be entered into  your electronic medical record.  These signatures attest to the fact that that the information above on your After Visit Summary has been reviewed and is understood.  Full responsibility of the confidentiality of this discharge information lies with you and/or your care-partner.

## 2019-08-27 NOTE — Progress Notes (Signed)
Called to room to assist during endoscopic procedure.  Patient ID and intended procedure confirmed with present staff. Received instructions for my participation in the procedure from the performing physician.  

## 2019-08-27 NOTE — Progress Notes (Signed)
Temperature- Danny [name]  Pt's states no medical or surgical changes since previsit or office visit.  Pt has Parkinson's disease- wife says he may hallucinate at time.

## 2019-08-27 NOTE — Progress Notes (Signed)
A and O x3. Report to RN. Tolerated MAC anesthesia well.

## 2019-08-31 ENCOUNTER — Telehealth: Payer: Self-pay

## 2019-08-31 NOTE — Telephone Encounter (Signed)
  Follow up Call-  Call back number 08/27/2019  Post procedure Call Back phone  # [phone]  Permission to leave phone message Yes  Some recent data might be hidden     Patient questions:  Do you have a fever, pain , or abdominal swelling? No. Pain Score  0 *  Have you tolerated food without any problems? Yes.    Have you been able to return to your normal activities? Yes.    Do you have any questions about your discharge instructions: Diet   No. Medications  No. Follow up visit  No.  Do you have questions or concerns about your Care? No.  Actions: * If pain score is 4 or above: No action needed, pain <4. 1. Have you developed a fever since your procedure? no  2.   Have you had an respiratory symptoms (SOB or cough) since your procedure? no  3.   Have you tested positive for COVID 19 since your procedure no  4.   Have you had any family members/close contacts diagnosed with the COVID 19 since your procedure?  no   If yes to any of these questions please route to Joylene John, RN and Alphonsa Gin, Therapist, sports.

## 2019-09-04 ENCOUNTER — Encounter: Payer: Self-pay | Admitting: Internal Medicine

## 2019-09-04 DIAGNOSIS — Z86010 Personal history of colonic polyps: Secondary | ICD-10-CM

## 2019-09-04 DIAGNOSIS — Z860101 Personal history of adenomatous and serrated colon polyps: Secondary | ICD-10-CM

## 2019-09-04 HISTORY — DX: Personal history of colonic polyps: Z86.010

## 2019-09-04 HISTORY — DX: Personal history of adenomatous and serrated colon polyps: Z86.0101

## 2019-09-04 NOTE — Progress Notes (Signed)
Diminutive adenoma Recall would be 7-10 years - but given current age + findings no recall  My Chart

## 2019-10-05 ENCOUNTER — Ambulatory Visit (INDEPENDENT_AMBULATORY_CARE_PROVIDER_SITE_OTHER): Payer: Medicare Other | Admitting: Neurology

## 2019-10-05 ENCOUNTER — Other Ambulatory Visit: Payer: Self-pay

## 2019-10-05 ENCOUNTER — Encounter: Payer: Self-pay | Admitting: Neurology

## 2019-10-05 ENCOUNTER — Telehealth: Payer: Self-pay | Admitting: Neurology

## 2019-10-05 VITALS — BP 88/60 | HR 98 | Temp 97.1°F | Ht 70.0 in | Wt 225.0 lb

## 2019-10-05 DIAGNOSIS — G20 Parkinson's disease: Secondary | ICD-10-CM | POA: Diagnosis not present

## 2019-10-05 DIAGNOSIS — G249 Dystonia, unspecified: Secondary | ICD-10-CM

## 2019-10-05 DIAGNOSIS — Z9181 History of falling: Secondary | ICD-10-CM | POA: Diagnosis not present

## 2019-10-05 DIAGNOSIS — R441 Visual hallucinations: Secondary | ICD-10-CM | POA: Diagnosis not present

## 2019-10-05 DIAGNOSIS — R413 Other amnesia: Secondary | ICD-10-CM

## 2019-10-05 MED ORDER — PRAMIPEXOLE DIHYDROCHLORIDE 0.5 MG PO TABS: 0.5000 mg | ORAL_TABLET | Freq: Three times a day (TID) | ORAL | 3 refills | Status: DC

## 2019-10-05 NOTE — Progress Notes (Signed)
Subjective:    Patient ID: Danny [name] is a 68 y.o. male.  HPI    Interim history:   Danny [name] is a very pleasant 68 year old right-handed gentleman with an underlying medical history of type 2 diabetes, osteoarthritis, hypertension, obesity, hyperlipidemia, and cataracts, who presents for follow-up consultation of his Parkinson's disease, associated with RBD and mild forgetfulness.  He is accompanied by his wife again today. I last saw him on 06/04/2019, at which time he reported difficulty with his mobility.  He had fallen, thankfully without major injuries.  His wife had noticed more forgetfulness and some confusion at times.  He was more sleepy during the day.  He was taking Sinemet 4 times a day and had more difficulty in the mornings.  He was on Mirapex 3 times a day.  He had intermittent constipation.  He reported fleeting hallucinations, nothing sustained or anxiety provoking.  He was advised to increase his Sinemet to 1 pill 5 times a day at 3 hourly intervals starting at 8 AM.  He was advised to continue with Mirapex 3 times daily.  He was advised to be very proactive about constipation issues.  Today, 10/05/2019: He reports feeling tired during the day, his wife reports most of his history today.  She reports that he gets quite sleepy in the evening and slower.  Mornings also difficult, when he has to get up to use the bathroom at night he is unstable.  Of note, he has fallen twice within a week's time, about 10 days ago at her brother-in-law's place, he tripped over uneven ground and landed front forward, bruised his sternum and ribs.  He feels better in that regard, about 2 days later he fell at home in the kitchen and hit his head against a curtain holder and had a small area of skin injury on the right forehead.  He denies any headaches.  He has had intermittent visual hallucinations and she has noticed some motor dyskinesias.  He has new glasses and also strings to hold up his  glasses in place, new prisms which have helped.  Constipation is not a major issue currently.  When we first increase the Sinemet to 5 times a day she noticed improvement in his motor function.  He continues to take Mirapex 0.75 mg 3 times daily and Sinemet 5 times a day starting at 8 AM, 3 hourly.  He does not hydrate very well with water.  His memory is about the same, he is forgetful.   The patient's allergies, current medications, family history, past medical history, past social history, past surgical history and problem list were reviewed and updated as appropriate.      Previously (copied from previous notes for reference):      I saw him on 12/03/2018, at which time he was advised to continue with Sinemet 1 pill 4 times a day.  He did have her fall incident after he missed the chair when sitting down.  His memory was stable for the most part.  He was advised to hydrate well and be proactive about constipation issues, we talked about fall prevention.     I saw him on 06/05/2018, at which time he felt fairly stable but his wife had noticed more shuffling and also some mood irritability. He had no falls thankfully and some forgetfulness was noted. He was more sleepy during the day. Suggested he continue with generic Mirapex 0.75 mg 3 times a day but increase his Sinemet from 3 times  a day to 1 pill 4 times a day.   His wife called in the interim in October 2019 requesting a letter to support their cancellation of their timeshare as he was not able to drive and they were not able to utilize their timeshare.    I saw him on 12/05/2017, at which time he felt fairly stable on Sinemet, we had reduced his Mirapex to 0.75 mg 3 times a day. He is trying to stay active. He had no recent falls. I suggested we continue with his Sinemet and Mirapex at the current doses.   I saw him on 09/09/2017 at which time he had been more sleepy during the day. His wife was worried that he was more preoccupied with  sex. He had ongoing issues with intermittent double vision. He had intermittent and mild dream enactments. Suggested we gradually start him on low-dose Sinemet starting with half pill and gradually increasing this to 1 pill 3 times a day. His wife called in the interim in October reporting that he had done fairly well with Sinemet, at which time we reduced his Mirapex from 1 mg 3 times a day to 0.75 mg 3 times a day.      I saw him on 03/04/2017, at which time he felt stable. He retired in December 2017. The daughter was living with them. Mother-in-law had moved in as well. His wife retired also. He was going to the gym on a regular basis. Memory-wise, he had mild forgetfulness, no mood issues, no sleep issues, no side effects from the Mirapex. He had no recent falls thankfully. He had some mild dream enactments but no major issues. Looking back, his symptoms of peak date back to 2014, he had a sleep study in early 2014 with PLMS noted but no OSA and he denies restless leg symptoms. We mutually agreed to taper him off of Azilect as it was too expensive. I suggested in lieu of the Azilect, we increase his pramipexole to 1 mg 3 times a day.     I saw him on 09/03/2016, at which time he reported doing well, no telltale differences after increasing the Mirapex. He gave up driving. He decided to retire by the end of December 2017. His mother-in-law was supposed to move in with them. His memory was stable, sleep was stable, no real mood issues, no recent falls. I suggested we keep his medications the same including Azilect 1 mg once daily, Mirapex 0.75 mg 3 times a day.   I saw him on 02/27/2016, at which time he reported more difficulty with his walking, he had become slower. Wife had noted that it would take him longer to do things. He was dragging his feet at times. He was not swinging his arms as well. He was working full-time as a Land, avoiding stairs and heights. His A1c in December 2016 was  a little up at 7.6. Memory was stable, with some mild forgetfulness. Mood was stable, RBD was infrequent. He reported no recent falls. I suggested he continue with Azilect and we increased his Mirapex to 0.75 mg 3 times a day.    I saw him on 08/29/2015 at which time he reported doing fairly well. He was able to tolerate Azilect. His Mirapex was 0.5 mg 3 times a day. He was working at the computer most of the time. He was not always drinking enough water. He had a good appetite. He felt his memory was stable. RBD was less frequent but  still occurring. His wife was able to sleep in the same bed with him. He had no recent falls. He had some recent blurry vision was going to see his ophthalmologist. We mutually agreed to continue his current medication regimen.   I saw him on 04/25/2015, at which time he reported doing fairly well overall, but had noted smaller changes, including more slowness, difficulty with climbing ladders. He was advised by his boss to not climb ladders or walk over obstacles at work. Thankfully, his work environment is understanding. He had another business trip coming up and a colleague was to drive and do the climbing on the sites. He reported mild forgetfulness and some problems with depth perception. His diplopia had improved since he was given prism glasses by his ophthalmologist. I suggested, we try him on Azilect and keep his Mirapex the same.    I saw him on 02/04/2015, at which time he reported ongoing issues with diplopia, particularly at night while driving. He was still working full-time. He was still on low-dose Mirapex, 0.25 mg 3 times a day. He had no side effects. He had had a sleep study which per his report did not show any OSA. I asked him to make an appointment with his eye doctor. I suggested an increase in Mirapex to 0.5 mg 3 times a day.   I first met him on 12/28/2014, at which time he reported that he was having more fatigue. However, he also had reduce his  caffeine intake. He had no recent falls and mood stable. He was working full-time. He reported that he had intermittent double vision in the last 3 months. His PCP requested a sooner than scheduled appointment for diplopia. His exam was stable at the time and I suggested no new medication changes.   He has had some short-term memory issues. He works full-time. He is an Chief Financial Officer. He likes square dance but his wife has noted that he has had more problems with his nighttime driving and his squared hands. In the recent 3 months he has had some intermittent double vision at night. He has drooling at night. He rarely drinks alcohol and usually drinks 4-5 cups of coffee per day, occasional sodas. He quit smoking in 1978. Blood pressure is low for him today. It was recently noted to be trending lower and his blood pressure medication was reduced. He takes his cholesterol medication every other day.   He had a brain MRI without contrast on 11/04/2013: Normal MRI scan of the brain. Incidental findings of a chronic paranasal sinusitis with deviated nasal septum to the left.  He previously followed with Dr. Jim Like and was last seen by him on 08/30/2014, at which time his Mirapex was kept at 0.25 mg 3 times a day. He reported no side effects, in particular no impulse control disorder. I reviewed Dr. Hazle Quant notes. He first met Dr. Janann Colonel on 10/26/13, at which time the patient reported a right hand tremor noticed over the previous 4-6 months. He had changes in his handwriting, slowness, some stiffness and changes in his walking.  His wife reported a longer standing history of REM behavior disorder. He has been in outpatient physical therapy.  His Past Medical History Is Significant For: Past Medical History:  Diagnosis Date  . Allergy    SEASONAL  . Cataract    BILATERAL-REMOVED  . Essential hypertension, benign    PT.DENIES ON AS PREVENTIVE 08/17/19  . GERD (gastroesophageal reflux disease)   . Hx of  adenomatous polyp of colon 09/04/2019   08/2019 diminutive adenoma No recall given findings and age  . Mixed hyperlipidemia    PT.DENIES,STATED ON AS PREVENTIVE 07/30/19  . Neuromuscular disorder (HCC)    PARKINSONS  . OA (osteoarthritis) of knee    right  . Obesity, unspecified   . Osteoarthritis    KNEE AND SHOULDERS  . Parkinsons ([place])   . Type II or unspecified type diabetes mellitus without mention of complication, not stated as uncontrolled     His Past Surgical History Is Significant For: Past Surgical History:  Procedure Laterality Date  . CATARACT EXTRACTION, BILATERAL    . GANGLION CYST EXCISION  age 53 years   right  . KNEE ARTHROSCOPY     right  . KNEE ARTHROSCOPY    . WRIST FRACTURE SURGERY    . WRIST SURGERY  04/2012    His Family History Is Significant For: Family History  Problem Relation Age of Onset  . Diabetes Sister   . Diabetes Daughter   . Cancer Father   . Heart disease Father   . Colon polyps Son   . Diabetes Mother   . Lung cancer Mother   . Colon cancer Neg Hx   . Esophageal cancer Neg Hx   . Rectal cancer Neg Hx   . Stomach cancer Neg Hx     His Social History Is Significant For: Social History   Socioeconomic History  . Marital status: Married    Spouse name: [name]  . Number of children: 3  . Years of education: 16  . Highest education level: Bachelor's degree (e.g., BA, AB, BS)  Occupational History  . Occupation: Glass blower/designer  . Financial resource strain: Not hard at all  . Food insecurity    Worry: Never true    Inability: Never true  . Transportation needs    Medical: No    Non-medical: No  Tobacco Use  . Smoking status: Former Smoker    Packs/day: 1.50    Years: 15.00    Pack years: 22.50    Types: Cigarettes    Quit date: 12/17/1982    Years since quitting: 36.8  . Smokeless tobacco: Never Used  . Tobacco comment: over 40 yrs  Substance and Sexual Activity  . Alcohol use: Yes    Comment: very rare  . Drug  use: No  . Sexual activity: Yes    Partners: Female  Lifestyle  . Physical activity    Days per week: 0 days    Minutes per session: 0 min  . Stress: Not at all  Relationships  . Social connections    Talks on phone: More than three times a week    Gets together: More than three times a week    Attends religious service: More than 4 times per year    Active member of club or organization: Yes    Attends meetings of clubs or organizations: More than 4 times per year    Relationship status: Married  Other Topics Concern  . Not on file  Social History Narrative   [name], motorcycle rider.  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  He has 3 adult children.  His wife had 2 children, one of whom is deceased.   Patient is working full-time.   Patient has a Bachelor's degree.   Patient is right-handed.   Patient drinks 6 cups of coffee daily.    His Allergies Are:  Allergies  Allergen Reactions  .  Codeine Nausea And Vomiting  :   His Current Medications Are:  Outpatient Encounter Medications as of 10/05/2019  Medication Sig  . Ascorbic Acid (VITAMIN C ADULT GUMMIES PO) Take by mouth daily.  Marland Kitchen aspirin 81 MG tablet Take 81 mg by mouth daily.    . carbidopa-levodopa (SINEMET IR) 25-100 MG tablet Take 1 tablet by mouth 5 (five) times daily.  . Cinnamon 500 MG capsule Take 500 mg by mouth daily.  . enalapril (VASOTEC) 2.5 MG tablet Take 1 tablet by mouth  daily (Patient taking differently: Take 2.5 mg by mouth every other day. Take 1 tablet by mouth  daily)  . FAMOTIDINE PO Take by mouth.  . Ginger, Zingiber officinalis, 550 MG CAPS Take 550 mg by mouth daily.    Marland Kitchen glipiZIDE (GLUCOTROL XL) 10 MG 24 hr tablet TAKE 1 TABLET BY MOUTH DAILY  . Glucosamine-Chondroit-Vit C-Mn (GLUCOSAMINE CHONDR 1500 COMPLX PO) Take 1,500 mg by mouth daily.    Marland Kitchen glucose blood test strip Use as instructed  . ketoconazole (NIZORAL) 2 % cream Apply topically 2 (two) times daily.  . Multiple Vitamin  (MULTIVITAMIN) tablet Take 1 tablet by mouth daily.  . pramipexole (MIRAPEX) 0.75 MG tablet Take 1 tablet (0.75 mg total) by mouth 3 (three) times daily.  . simvastatin (ZOCOR) 40 MG tablet TAKE 0.5 TABLETS (20 MG TOTAL) BY MOUTH DAILY.  . sitaGLIPtin-metformin (JANUMET) 50-1000 MG tablet Take 1 tablet by mouth two  times daily with meals   Facility-Administered Encounter Medications as of 10/05/2019  Medication  . 0.9 %  sodium chloride infusion  :  Review of Systems:  Out of a complete 14 point review of systems, all are reviewed and negative with the exception of these symptoms as listed below: Review of Systems  Neurological:       Pt presents today to discuss his PD. Pt reports that he feels tired all the time. Pt had 2 falls last week.    Objective:  Neurological Exam  Physical Exam Physical Examination:   Vitals:   10/05/19 1401  BP: (!) 88/60  Pulse: 98  Temp: (!) 97.1 F (36.2 C)    General Examination: The patient is a very pleasant 68 y.o. male in no acute distress. He appears well-developed and well-nourished and well groomed.   HEENT:Normocephalic, atraumatic, pupils are equal, round and reactive to light and accommodation. Extraocular trackingshows mild saccadic breakdown, no nystagmus, no significant limitation to downgaze, slight limitation to upper gaze. He has mild decrease in eye blink rate, status post bilateral cataract repairs, hearing is grossly intact, bilateral hearing aids in place.Prism eye glassesin place.Face is moderately masked, neck is moderately rigid, airway examination reveals mild mouth dryness, no significant airway crowding, Mallampati class II, no sialorrhea. Tongue protrudes centrally and palate elevates symmetrically. Moderate hypophonia,with milddysarthria noted.  Chest:Clear to auscultation without wheezing, rhonchi or crackles noted.  Heart:S1+S2+0, regular and normal without murmurs, rubs or gallops noted.    Abdomen:Soft, non-tender and non-distended with normal bowel sounds appreciated on auscultation.  Extremities:There is nopitting edema in the distal lower extremities bilaterally.  Skin: Warm and dry without trophic changes noted.  Musculoskeletal: exam reveals no obvious joint deformities, tenderness or joint swelling or erythema.   Neurologically:  Mental status: The patient is awake, alert and oriented in all 4 spheres. Hisimmediate and remote memory, attention, language skills and fund of knowledge are mildly impaired. There is no evidence of aphasia, agnosia, apraxia or anomia. Speech as above. Thought process is linear.  Mood is normaland affect is normal.   On 06/04/2019: MMSE: 25/30, CDT: 2/4, AFT: 14/min.  Cranial nerves II - XII are as described above under HEENT exam.  Motor exam: Normal bulk, andstrengthis noted. He has mild increase in tone on the right side. Overall mildto moderatebradykinesia is noted, no consistent resting tremor. He has mild, intermittent lower body dyskinesias, more so in the right leg. Fine motor skills aremoderately impaired on the right and mildto moderately impaired on the left.  Sensory exam is intact to light touch.  Cerebellar testing shows no dysmetria or intention tremor, no ataxia. Gait, station and balance: Hestands with mild difficulty, he does push himself up with his hands. He has a mild lean to the left, more moderately stooped posture,stable.Stance is naturally slightly wide-based. He walks with decreased arm swingbilaterallyon the right>left. Balance is mildly impaired, but stable. He has a 3- point cane.  Assessment and plan:   In summary, Danny [name] a very pleasant 68 year old malewith anunderlying medical history of type 2 diabetes, osteoarthritis, hypertension, obesity, hyperlipidemia, cataracts withstatus post surgeries, who presents for follow-up consultation of his right-sided predominant  Parkinson's disease,complicated by mild RBDin the past, some daytimesleepiness, mild memory loss with Sx dating back to around 5/14 and RBD symptoms preceding motor symptoms by perhaps 12-15 years even. He has donefairly well over the lastfewyears. Nevertheless, he has had some progression. He has had some falls, thankfully without major injuries, But he did fall recently about a week ago and bumped his head against the wall on the right side.I would like to proceed with a head CT without contrast.  He has had some memory loss over time.  He has had more daytime somnolence and has developed visual hallucinations as well as mild dyskinesias.  We increase his Sinemet to 1 pill 5 times a day last time which was somewhat beneficial.  I would like to reduce his Mirapex further this time around to 0.5 mg 3 times daily.  He is currently on 0.75 mg 3 times daily and we reduced it in the past due to preoccupation with sex.  I talked to the patient and his wife regarding possible future considerations including Nuplazid for PD associated Hallucinations and delusions as well as Nourianz, A novel medication, adenosine receptor antagonist for Parkinson's disease, approved for patients experiencing off times. He was also on Azilect in the past but this was discontinued in March 2018 due to cost.  For now, we will continue with Sinemet 5 times a day and reduce the Mirapex.  He is advised to stay well-hydrated and change positions slowly.  We talked about the importance of fall prevention.  We will call with the head CT report.  I plan to see him back in about 4 months, sooner if needed.  I answered all their questions today and the patient and his wife were in agreement.

## 2019-10-05 NOTE — Telephone Encounter (Signed)
medicare/[facility] order sent to GI. No auth they will reach out to the patient to schedule.

## 2019-10-05 NOTE — Patient Instructions (Signed)
As discussed, I will order a head CT without contrast as you recently fell and hit your head on the right side against the wall.  We will call you with your head CT results.  Also, we will reduce your Mirapex from 0.75 mg 3 times daily to 0.5 mg 3 times daily because of daytime sleepiness and hallucinations.    For future consideration, we may use: Nourianz 20 mg daily for off times. Side effects may include involuntary movements called dyskinesia, constipation, Nausea, dizziness, decreased appetite, rare side effects may include rash, diarrhea and hallucinations.   Also for Parkinson's related hallucinations, for future consideration: we may use Nuplazid, which is specifically approved by the FDA for this indication.

## 2019-10-09 ENCOUNTER — Ambulatory Visit
Admission: RE | Admit: 2019-10-09 | Discharge: 2019-10-09 | Disposition: A | Discharge status: Home / Self Care | Payer: Medicare Other | Source: Ambulatory Visit | Attending: Neurology | Admitting: Neurology

## 2019-10-09 DIAGNOSIS — G20 Parkinson's disease: Secondary | ICD-10-CM | POA: Diagnosis not present

## 2019-10-09 DIAGNOSIS — Z9181 History of falling: Secondary | ICD-10-CM

## 2019-10-09 DIAGNOSIS — R441 Visual hallucinations: Secondary | ICD-10-CM

## 2019-10-12 ENCOUNTER — Telehealth: Payer: Self-pay

## 2019-10-12 NOTE — Telephone Encounter (Signed)
-----   Message from [name], MD sent at 10/12/2019 12:53 PM EDT ----- Please call patient and advise him or talk to his wife: His recent head CT without contrast showed no acute findings.

## 2019-10-12 NOTE — Progress Notes (Signed)
Please call patient and advise him or talk to his wife: His recent head CT without contrast showed no acute findings.

## 2019-10-12 NOTE — Telephone Encounter (Signed)
I called pt to discuss his CT head results. No answer, left a message asking him to call me back.

## 2019-10-13 NOTE — Telephone Encounter (Signed)
Noted, thanks!

## 2019-10-13 NOTE — Telephone Encounter (Signed)
Pt returned call, pt was advised of results

## 2019-10-19 DIAGNOSIS — Z23 Encounter for immunization: Secondary | ICD-10-CM | POA: Diagnosis not present

## 2019-11-05 DIAGNOSIS — E1159 Type 2 diabetes mellitus with other circulatory complications: Secondary | ICD-10-CM | POA: Diagnosis not present

## 2019-11-05 DIAGNOSIS — E785 Hyperlipidemia, unspecified: Secondary | ICD-10-CM | POA: Diagnosis not present

## 2019-11-05 DIAGNOSIS — E1169 Type 2 diabetes mellitus with other specified complication: Secondary | ICD-10-CM | POA: Diagnosis not present

## 2019-11-05 DIAGNOSIS — I10 Essential (primary) hypertension: Secondary | ICD-10-CM | POA: Diagnosis not present

## 2019-11-05 DIAGNOSIS — G20 Parkinson's disease: Secondary | ICD-10-CM | POA: Diagnosis not present

## 2020-01-06 ENCOUNTER — Other Ambulatory Visit: Payer: Self-pay

## 2020-01-06 ENCOUNTER — Encounter: Payer: Self-pay | Admitting: Orthopaedic Surgery

## 2020-01-06 ENCOUNTER — Ambulatory Visit (INDEPENDENT_AMBULATORY_CARE_PROVIDER_SITE_OTHER): Payer: Medicare Other

## 2020-01-06 ENCOUNTER — Ambulatory Visit (INDEPENDENT_AMBULATORY_CARE_PROVIDER_SITE_OTHER): Payer: Medicare Other | Admitting: Orthopaedic Surgery

## 2020-01-06 VITALS — Ht 71.0 in | Wt 218.0 lb

## 2020-01-06 DIAGNOSIS — M25561 Pain in right knee: Secondary | ICD-10-CM

## 2020-01-06 DIAGNOSIS — M1711 Unilateral primary osteoarthritis, right knee: Secondary | ICD-10-CM

## 2020-01-06 DIAGNOSIS — G8929 Other chronic pain: Secondary | ICD-10-CM | POA: Diagnosis not present

## 2020-01-06 MED ORDER — METHYLPREDNISOLONE ACETATE 40 MG/ML IJ SUSP
80.0000 mg | INTRAMUSCULAR | Status: AC | PRN
  Administered 2020-01-06: 17:00:00 80 mg via INTRA_ARTICULAR

## 2020-01-06 MED ORDER — LIDOCAINE HCL 1 % IJ SOLN
2.0000 mL | INTRAMUSCULAR | Status: AC | PRN
  Administered 2020-01-06: 17:00:00 2 mL

## 2020-01-06 MED ORDER — BUPIVACAINE HCL 0.5 % IJ SOLN
2.0000 mL | INTRAMUSCULAR | Status: AC | PRN
  Administered 2020-01-06: 17:00:00 2 mL via INTRA_ARTICULAR

## 2020-01-06 NOTE — Progress Notes (Signed)
Office Visit Note   Patient: Danny [name]           Date of Birth: June 23, 1951           MRN: [id number] Visit Date: 01/06/2020              Requested by: [name], [address],  [place] [id number] PCP: [name], PA   Assessment & Plan: Visit Diagnoses:  1. Chronic pain of right knee   2. Primary osteoarthritis of right knee     Plan: Danny [name] is accompanied by his wife.  He does have Parkinson's disease and is being followed by local neurologist.  Also has history of diabetes and being followed by his primary care physician.  He has end-stage osteoarthritis of his right knee and had inquired about specific treatment options including knee replacement.  Both he and his wife would like to proceed with a knee replacement.  He will need clearance forms from his physicians.  I am happy to proceed if his physicians think he is a good candidate.  He obviously is compromised in his activities related to his knee.  I did inject the knee with cortisone today as a temporizing procedure.  Because of Covid elective surgeries will be postponed for several months  Follow-Up Instructions: Return if symptoms worsen or fail to improve.   Orders:  Orders Placed This Encounter  Procedures  . Large Joint Inj: R knee  . XR KNEE 3 VIEW RIGHT   No orders of the defined types were placed in this encounter.     Procedures: Large Joint Inj: R knee on 01/06/2020 5:13 PM Indications: pain and diagnostic evaluation Details: 25 G 1.5 in needle, anteromedial approach  Arthrogram: No  Medications: 2 mL lidocaine 1 %; 2 mL bupivacaine 0.5 %; 80 mg methylPREDNISolone acetate 40 MG/ML Procedure, treatment alternatives, risks and benefits explained, specific risks discussed. Consent was given by the patient. Immediately prior to procedure a time out was called to verify the correct patient, procedure, equipment, support staff and site/side marked as required.  Patient was prepped and draped in the usual sterile fashion.       Clinical Data: No additional findings.   Subjective: Chief Complaint  Patient presents with  . Right Knee - Pain  Patient presents today for recurrent right knee pain. He was last evaluated in May of 2019 and received a cortisone injection. The injection did not last long. He said that it has been hurting for a year, but worsening recently. He is taking Tylenol. He also states that his knee gives way at time, but has not fallen due to his knee. His knee swells and grinds. The pain is worse in the morning and at night. He has Parkinson's and walks with a cane.  Both he and his wife relate that he is compromised in his activities because of his knee.  He gets catching sensations and a feeling like his knee may give way.  He thinks it separate from his Parkinson's.  HPI  Review of Systems   Objective: Vital Signs: Ht 5\' 11"  (1.803 m)   Wt 218 lb (98.9 kg)   BMI 30.40 kg/m   Physical Exam Constitutional:      Appearance: He is well-developed.  Eyes:     Pupils: Pupils are equal, round, and reactive to light.  Pulmonary:     Effort: Pulmonary effort is normal.  Skin:    General: Skin is  warm and dry.  Neurological:     Mental Status: He is alert and oriented to person, place, and time.  Psychiatric:        Behavior: Behavior normal.     Ortho Exam awake alert and oriented x3.  Comfortable sitting.  Wearing a mask.  Did not appear to have tremor today.  Has increased varus right knee with slight decrease in full extension.  Minimal effusion.  The knee was not hot.  Predominately medial joint pain.  There are some palpable osteophytes.  Also patella crepitation with flexion extension.  Flexion over 100 degrees without instability.  No popliteal pain.  No pain with range of motion of his right hip.  No calf discomfort.  No distal edema  Specialty Comments:  No specialty comments available.  Imaging: XR KNEE 3  VIEW RIGHT  Result Date: 01/06/2020 Films of the right knee were obtained in several projections standing.  There is essentially bone-on-bone in the medial compartment with subchondral sclerosis particularly in the subchondral bone on the femoral side and peripheral osteophytes.  No soft tissue calcification.  Possibly loose body in the posterior recess medially.  Degenerative changes are also identified in the lateral compartment and patellofemoral joint.  No acute change or fracture.  Films are consistent with advanced osteoarthritis with about 2 degrees of varus    PMFS History: Patient Active Problem List   Diagnosis Date Noted  . Hematospermia 11/18/2015  . Epididymal cyst 05/25/2015  . Parkinson disease ([place]) 01/14/2014  . Obesity (BMI 30-39.9) 07/14/2013  . Confusional arousals 02/13/2013  . Disorder of ligament of right wrist 01/29/2012  . Diabetes mellitus type 2, uncomplicated ([place]) 99991111  . Mixed hyperlipidemia 10/09/2011  . OA (osteoarthritis) of knee 10/09/2011  . Essential hypertension, benign 10/09/2011   Past Medical History:  Diagnosis Date  . Allergy    SEASONAL  . Cataract    BILATERAL-REMOVED  . Essential hypertension, benign    PT.DENIES ON AS PREVENTIVE 08/17/19  . GERD (gastroesophageal reflux disease)   . Hx of adenomatous polyp of colon 09/04/2019   08/2019 diminutive adenoma No recall given findings and age  . Mixed hyperlipidemia    PT.DENIES,STATED ON AS PREVENTIVE 07/30/19  . Neuromuscular disorder ([facility])    PARKINSONS  . OA (osteoarthritis) of knee    right  . Obesity, unspecified   . Osteoarthritis    KNEE AND SHOULDERS  . Parkinsons ([place])   . Type II or unspecified type diabetes mellitus without mention of complication, not stated as uncontrolled     Family History  Problem Relation Age of Onset  . Diabetes Sister   . Diabetes Daughter   . Cancer Father   . Heart disease Father   . Colon polyps Son   . Diabetes Mother   . Lung cancer  Mother   . Colon cancer Neg Hx   . Esophageal cancer Neg Hx   . Rectal cancer Neg Hx   . Stomach cancer Neg Hx     Past Surgical History:  Procedure Laterality Date  . CATARACT EXTRACTION, BILATERAL    . GANGLION CYST EXCISION  age 41 years   right  . KNEE ARTHROSCOPY     right  . KNEE ARTHROSCOPY    . WRIST FRACTURE SURGERY    . WRIST SURGERY  04/2012   Social History   Occupational History  . Occupation: Chief Financial Officer  Tobacco Use  . Smoking status: Former Smoker    Packs/day: 1.50    Years:  15.00    Pack years: 22.50    Types: Cigarettes    Quit date: 12/17/1982    Years since quitting: 37.0  . Smokeless tobacco: Never Used  . Tobacco comment: over 40 yrs  Substance and Sexual Activity  . Alcohol use: Yes    Comment: very rare  . Drug use: No  . Sexual activity: Yes    Partners: Female

## 2020-01-29 ENCOUNTER — Ambulatory Visit: Payer: Medicare Other | Attending: Internal Medicine

## 2020-01-29 DIAGNOSIS — Z23 Encounter for immunization: Secondary | ICD-10-CM | POA: Insufficient documentation

## 2020-01-29 NOTE — Progress Notes (Signed)
   Covid-19 Vaccination Clinic  Name:  Danny [name]    MRN: [id number] DOB: [birth year]  01/29/2020  [name] was observed post Covid-19 immunization for 15 minutes without incidence. He was provided with Vaccine Information Sheet and instruction to access the V-Safe system.   [name] was instructed to call 911 with any severe reactions post vaccine: Marland Kitchen Difficulty breathing  . Swelling of your face and throat  . A fast heartbeat  . A bad rash all over your body  . Dizziness and weakness    Immunizations Administered    Name Date Dose VIS Date Route   Pfizer COVID-19 Vaccine 01/29/2020 12:08 PM 0.3 mL 11/27/2019 Intramuscular   Manufacturer: [employer]   Lot: [id number]   Sharp: [id number]

## 2020-02-11 ENCOUNTER — Ambulatory Visit (INDEPENDENT_AMBULATORY_CARE_PROVIDER_SITE_OTHER): Payer: Medicare Other | Admitting: Neurology

## 2020-02-11 ENCOUNTER — Encounter: Payer: Self-pay | Admitting: Neurology

## 2020-02-11 ENCOUNTER — Other Ambulatory Visit: Payer: Self-pay

## 2020-02-11 VITALS — BP 111/74 | HR 95 | Temp 98.0°F | Ht 70.0 in | Wt 223.0 lb

## 2020-02-11 DIAGNOSIS — R413 Other amnesia: Secondary | ICD-10-CM | POA: Diagnosis not present

## 2020-02-11 DIAGNOSIS — G20 Parkinson's disease: Secondary | ICD-10-CM

## 2020-02-11 DIAGNOSIS — M25561 Pain in right knee: Secondary | ICD-10-CM

## 2020-02-11 DIAGNOSIS — R441 Visual hallucinations: Secondary | ICD-10-CM | POA: Diagnosis not present

## 2020-02-11 DIAGNOSIS — Z9181 History of falling: Secondary | ICD-10-CM | POA: Diagnosis not present

## 2020-02-11 DIAGNOSIS — G8929 Other chronic pain: Secondary | ICD-10-CM

## 2020-02-11 NOTE — Progress Notes (Signed)
Subjective:    Patient ID: Danny [name] is a 69 y.o. male.  HPI     Interim history:    [name] is a very pleasant 69 year old right-handed gentleman with an underlying medical history of type 2 diabetes, osteoarthritis, hypertension, obesity, hyperlipidemia, and cataracts, who presents for follow-up consultation of his Parkinson's disease, associated with RBD and mild forgetfulness.  He is accompanied by his wife again today. I last saw him on 10/05/2019, at which time he reported feeling tired during the day.  His wife endorsed that he was quite sleepy during the day, particularly in the early evenings.  He has had some falls.  He had bruised his sternum and ribs.  He did fall in his kitchen and hit his head against a curtain holder and had a small area of skin injury in the right forehead.  He was advised to continue with Sinemet 1 pill 5 times a day but advised to further reduce his Mirapex to 0.5 mg 3 times daily.  I asked him to have a head CT done.  He had a head CT without contrast on 10/08/2020 and I reviewed the results: IMPRESSION:    Normal CT head (without).  We called him with his test results.  Today, 02/11/2020: He reports overall doing okay, he did have a fall at his dentist office and needed help to get up.  He also had a recent instance at home where he was looking at something that had fallen down and he was on his hands and feet but could not get up on his own.  It was not actually a fall.  He is in need for a right knee replacement surgery, under Dr. Durward Fortes.  I signed off on the surgical clearance form today.  I explained to him that there is no obvious contraindication in his getting a elective surgery like to wait for placement but he could have some motor setbacks and these are tied and typically with the need for pain medication and being off schedule with his other medications and the fact that he may have more immobility initially after surgery.  He got his  first Covid shot, he is getting his second dose on 02/21/2020.  He is tentatively scheduled for his knee replacement in mid March.  His wife is wondering if they could or should push out his surgery until 2 or 3 weeks out from his second Covid shot.  She is encouraged to call the orthopedics office and see if the surgery could be timed at a slightly later date. He has had intermittent visual hallucination auditory hallucinations, he has on a rare occasion acted out on these hallucinations by opening the front door and trying to look for someone that he had heard or seen.   The patient's allergies, current medications, family history, past medical history, past social history, past surgical history and problem list were reviewed and updated as appropriate.      Previously (copied from previous notes for reference):     I saw him on 06/04/2019, at which time he reported difficulty with his mobility.  He had fallen, thankfully without major injuries.  His wife had noticed more forgetfulness and some confusion at times.  He was more sleepy during the day.  He was taking Sinemet 4 times a day and had more difficulty in the mornings.  He was on Mirapex 3 times a day.  He had intermittent constipation.  He reported fleeting hallucinations, nothing sustained or anxiety  provoking.  He was advised to increase his Sinemet to 1 pill 5 times a day at 3 hourly intervals starting at 8 AM.  He was advised to continue with Mirapex 3 times daily.  He was advised to be very proactive about constipation issues.    I saw him on 12/03/2018, at which time he was advised to continue with Sinemet 1 pill 4 times a day.  He did have her fall incident after he missed the chair when sitting down.  His memory was stable for the most part.  He was advised to hydrate well and be proactive about constipation issues, we talked about fall prevention.     I saw him on 06/05/2018, at which time he felt fairly stable but his wife had noticed  more shuffling and also some mood irritability. He had no falls thankfully and some forgetfulness was noted. He was more sleepy during the day. Suggested he continue with generic Mirapex 0.75 mg 3 times a day but increase his Sinemet from 3 times a day to 1 pill 4 times a day.   His wife called in the interim in October 2019 requesting a letter to support their cancellation of their timeshare as he was not able to drive and they were not able to utilize their timeshare.    I saw him on 12/05/2017, at which time he felt fairly stable on Sinemet, we had reduced his Mirapex to 0.75 mg 3 times a day. He is trying to stay active. He had no recent falls. I suggested we continue with his Sinemet and Mirapex at the current doses.   I saw him on 09/09/2017 at which time he had been more sleepy during the day. His wife was worried that he was more preoccupied with sex. He had ongoing issues with intermittent double vision. He had intermittent and mild dream enactments. Suggested we gradually start him on low-dose Sinemet starting with half pill and gradually increasing this to 1 pill 3 times a day. His wife called in the interim in October reporting that he had done fairly well with Sinemet, at which time we reduced his Mirapex from 1 mg 3 times a day to 0.75 mg 3 times a day.      I saw him on 03/04/2017, at which time he felt stable. He retired in December 2017. The daughter was living with them. Mother-in-law had moved in as well. His wife retired also. He was going to the gym on a regular basis. Memory-wise, he had mild forgetfulness, no mood issues, no sleep issues, no side effects from the Mirapex. He had no recent falls thankfully. He had some mild dream enactments but no major issues. Looking back, his symptoms of peak date back to 2014, he had a sleep study in early 2014 with PLMS noted but no OSA and he denies restless leg symptoms. We mutually agreed to taper him off of Azilect as it was too expensive. I  suggested in lieu of the Azilect, we increase his pramipexole to 1 mg 3 times a day.     I saw him on 09/03/2016, at which time he reported doing well, no telltale differences after increasing the Mirapex. He gave up driving. He decided to retire by the end of December 2017. His mother-in-law was supposed to move in with them. His memory was stable, sleep was stable, no real mood issues, no recent falls. I suggested we keep his medications the same including Azilect 1 mg once daily, Mirapex 0.75  mg 3 times a day.   I saw him on 02/27/2016, at which time he reported more difficulty with his walking, he had become slower. Wife had noted that it would take him longer to do things. He was dragging his feet at times. He was not swinging his arms as well. He was working full-time as a Land, avoiding stairs and heights. His A1c in December 2016 was a little up at 7.6. Memory was stable, with some mild forgetfulness. Mood was stable, RBD was infrequent. He reported no recent falls. I suggested he continue with Azilect and we increased his Mirapex to 0.75 mg 3 times a day.    I saw him on 08/29/2015 at which time he reported doing fairly well. He was able to tolerate Azilect. His Mirapex was 0.5 mg 3 times a day. He was working at the computer most of the time. He was not always drinking enough water. He had a good appetite. He felt his memory was stable. RBD was less frequent but still occurring. His wife was able to sleep in the same bed with him. He had no recent falls. He had some recent blurry vision was going to see his ophthalmologist. We mutually agreed to continue his current medication regimen.   I saw him on 04/25/2015, at which time he reported doing fairly well overall, but had noted smaller changes, including more slowness, difficulty with climbing ladders. He was advised by his boss to not climb ladders or walk over obstacles at work. Thankfully, his work environment is understanding.  He had another business trip coming up and a colleague was to drive and do the climbing on the sites. He reported mild forgetfulness and some problems with depth perception. His diplopia had improved since he was given prism glasses by his ophthalmologist. I suggested, we try him on Azilect and keep his Mirapex the same.    I saw him on 02/04/2015, at which time he reported ongoing issues with diplopia, particularly at night while driving. He was still working full-time. He was still on low-dose Mirapex, 0.25 mg 3 times a day. He had no side effects. He had had a sleep study which per his report did not show any OSA. I asked him to make an appointment with his eye doctor. I suggested an increase in Mirapex to 0.5 mg 3 times a day.   I first met him on 12/28/2014, at which time he reported that he was having more fatigue. However, he also had reduce his caffeine intake. He had no recent falls and mood stable. He was working full-time. He reported that he had intermittent double vision in the last 3 months. His PCP requested a sooner than scheduled appointment for diplopia. His exam was stable at the time and I suggested no new medication changes.   He has had some short-term memory issues. He works full-time. He is an Chief Financial Officer. He likes square dance but his wife has noted that he has had more problems with his nighttime driving and his squared hands. In the recent 3 months he has had some intermittent double vision at night. He has drooling at night. He rarely drinks alcohol and usually drinks 4-5 cups of coffee per day, occasional sodas. He quit smoking in 1978. Blood pressure is low for him today. It was recently noted to be trending lower and his blood pressure medication was reduced. He takes his cholesterol medication every other day.   He had a brain MRI without contrast on  11/04/2013: Normal MRI scan of the brain. Incidental findings of a chronic paranasal sinusitis with deviated nasal septum to the  left.  He previously followed with Dr. Jim Like and was last seen by him on 08/30/2014, at which time his Mirapex was kept at 0.25 mg 3 times a day. He reported no side effects, in particular no impulse control disorder. I reviewed Dr. Hazle Quant notes. He first met Dr. Janann Colonel on 10/26/13, at which time the patient reported a right hand tremor noticed over the previous 4-6 months. He had changes in his handwriting, slowness, some stiffness and changes in his walking.  His wife reported a longer standing history of REM behavior disorder. He has been in outpatient physical therapy.   His Past Medical History Is Significant For: Past Medical History:  Diagnosis Date  . Allergy    SEASONAL  . Cataract    BILATERAL-REMOVED  . Essential hypertension, benign    PT.DENIES ON AS PREVENTIVE 08/17/19  . GERD (gastroesophageal reflux disease)   . Hx of adenomatous polyp of colon 09/04/2019   08/2019 diminutive adenoma No recall given findings and age  . Mixed hyperlipidemia    PT.DENIES,STATED ON AS PREVENTIVE 07/30/19  . Neuromuscular disorder (HCC)    PARKINSONS  . OA (osteoarthritis) of knee    right  . Obesity, unspecified   . Osteoarthritis    KNEE AND SHOULDERS  . Parkinsons ([place])   . Type II or unspecified type diabetes mellitus without mention of complication, not stated as uncontrolled     His Past Surgical History Is Significant For: Past Surgical History:  Procedure Laterality Date  . CATARACT EXTRACTION, BILATERAL    . GANGLION CYST EXCISION  age 79 years   right  . KNEE ARTHROSCOPY     right  . KNEE ARTHROSCOPY    . WRIST FRACTURE SURGERY    . WRIST SURGERY  04/2012    His Family History Is Significant For: Family History  Problem Relation Age of Onset  . Diabetes Sister   . Diabetes Daughter   . Cancer Father   . Heart disease Father   . Colon polyps Son   . Diabetes Mother   . Lung cancer Mother   . Colon cancer Neg Hx   . Esophageal cancer Neg Hx   . Rectal  cancer Neg Hx   . Stomach cancer Neg Hx     His Social History Is Significant For: Social History   Socioeconomic History  . Marital status: Married    Spouse name: [name]  . Number of children: 3  . Years of education: 16  . Highest education level: Bachelor's degree (e.g., BA, AB, BS)  Occupational History  . Occupation: Chief Financial Officer  Tobacco Use  . Smoking status: Former Smoker    Packs/day: 1.50    Years: 15.00    Pack years: 22.50    Types: Cigarettes    Quit date: 12/17/1982    Years since quitting: 37.1  . Smokeless tobacco: Never Used  . Tobacco comment: over 40 yrs  Substance and Sexual Activity  . Alcohol use: Yes    Comment: very rare  . Drug use: No  . Sexual activity: Yes    Partners: Female  Other Topics Concern  . Not on file  Social History Narrative   [name], motorcycle rider.  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  He has 3 adult children.  His wife had 2 children, one of whom is deceased.   Patient is working  full-time.   Patient has a Bachelor's degree.   Patient is right-handed.   Patient drinks 6 cups of coffee daily.   Social Determinants of Health   Financial Resource Strain:   . Difficulty of Paying Living Expenses: Not on file  Food Insecurity:   . Worried About Charity fundraiser in the Last Year: Not on file  . Ran Out of Food in the Last Year: Not on file  Transportation Needs:   . Lack of Transportation (Medical): Not on file  . Lack of Transportation (Non-Medical): Not on file  Physical Activity:   . Days of Exercise per Week: Not on file  . Minutes of Exercise per Session: Not on file  Stress:   . Feeling of Stress : Not on file  Social Connections:   . Frequency of Communication with Friends and Family: Not on file  . Frequency of Social Gatherings with Friends and Family: Not on file  . Attends Religious Services: Not on file  . Active Member of Clubs or Organizations: Not on file  . Attends Archivist Meetings: Not on  file  . Marital Status: Not on file    His Allergies Are:  Allergies  Allergen Reactions  . Codeine Nausea And Vomiting  :   His Current Medications Are:  Outpatient Encounter Medications as of 02/11/2020  Medication Sig  . Ascorbic Acid (VITAMIN C ADULT GUMMIES PO) Take by mouth daily.  Marland Kitchen aspirin 81 MG tablet Take 81 mg by mouth daily.    . carbidopa-levodopa (SINEMET IR) 25-100 MG tablet Take 1 tablet by mouth 5 (five) times daily.  . Cinnamon 500 MG capsule Take 500 mg by mouth daily.  . enalapril (VASOTEC) 2.5 MG tablet Take 1 tablet by mouth  daily (Patient taking differently: Take 2.5 mg by mouth every other day. Take 1 tablet by mouth  daily)  . FAMOTIDINE PO Take by mouth.  . Ginger, Zingiber officinalis, 550 MG CAPS Take 550 mg by mouth daily.    Marland Kitchen glipiZIDE (GLUCOTROL XL) 10 MG 24 hr tablet TAKE 1 TABLET BY MOUTH DAILY  . Glucosamine-Chondroit-Vit C-Mn (GLUCOSAMINE CHONDR 1500 COMPLX PO) Take 1,500 mg by mouth daily.    Marland Kitchen glucose blood test strip Use as instructed  . ketoconazole (NIZORAL) 2 % cream Apply topically 2 (two) times daily.  . Multiple Vitamin (MULTIVITAMIN) tablet Take 1 tablet by mouth daily.  . pramipexole (MIRAPEX) 0.5 MG tablet Take 1 tablet (0.5 mg total) by mouth 3 (three) times daily.  . simvastatin (ZOCOR) 40 MG tablet TAKE 0.5 TABLETS (20 MG TOTAL) BY MOUTH DAILY.  . sitaGLIPtin-metformin (JANUMET) 50-1000 MG tablet Take 1 tablet by mouth two  times daily with meals   Facility-Administered Encounter Medications as of 02/11/2020  Medication  . 0.9 %  sodium chloride infusion  :  Review of Systems:  Out of a complete 14 point review of systems, all are reviewed and negative with the exception of these symptoms as listed below: Review of Systems  Neurological:       Reports decrease in balance and 2 falls since last visit.    Objective:  Neurological Exam  Physical Exam Physical Examination:   Vitals:   02/11/20 1500  BP: 111/74  Pulse: 95   Temp: 98 F (36.7 C)  SpO2: 97%    General Examination: The patient is a very pleasant 69 y.o. male in no acute distress. He appears well-developed and well-nourished and well groomed.   HEENT:Normocephalic, atraumatic,  pupils are equal, round and reactive to light, extraocular trackingshows mild saccadic breakdown, no nystagmus, no significant limitation to downgaze, slight limitation to upper gaze. He has decrease in eye blink rate, status post bilateral cataract repairs, hearing is grossly intact, bilateral hearing aids in place.Prism eye glassesin place.Face is moderatelymasked, neck is moderately rigid, airway examination reveals mild mouth dryness, no significant airway crowding, Mallampati class II, no sialorrhea. Tongue protrudes centrally and palate elevates symmetrically. Moderatehypophonia,with milddysarthria noted.  Chest:Clear to auscultation without wheezing, rhonchi or crackles noted.  Heart:S1+S2+0, regular and normal without murmurs, rubs or gallops noted.   Abdomen:Soft, non-tender and non-distended with normal bowel sounds appreciated on auscultation.  Extremities:There is nopitting edema in the distal lower extremities bilaterally.  Skin: Warm and dry without trophic changes noted.  Musculoskeletal: exam reveals R knee swelling and discomfort.   Neurologically:  Mental status: The patient is awake, alert and oriented in all 4 spheres. Hisimmediate and remote memory, attention, language skills and fund of knowledge aremildly impaired.There is no evidence of aphasia, agnosia, apraxia or anomia. Speechas above. Thought process is linear. Mood is normaland affect is normal.  On6/18/2020: MMSE: 25/30, CDT: 2/4, AFT: 14/min.  Cranial nerves II - XII are as described above under HEENT exam.  Motor exam: Normal bulk, andstrengthis noted. He has mild increase in tone on the right side. Overall mildto moderatebradykinesia is noted, no  consistent resting tremor. He has mild, intermittent lower body dyskinesias, more so in the right leg. Fine motor skills aremoderately impaired on the right and mildto moderately impaired on the left.  Sensory exam is intact to light touch.  Cerebellar testing shows no dysmetria or intention tremor, no ataxia. Gait, station and balance: Hestands with mild difficulty, he does push himself up with his hands. He has a mild lean to the left, a moderately stooped posture,stable.Stance is naturally slightly wide-based. He walks with decreased arm swingbilaterallyon the right>left. Balance is mildly impaired, but stable.He has a 3- point cane.  Assessment and plan:   In summary, VALLIE TETERS a very pleasant 69 year old malewith anunderlying medical history of type 2 diabetes, osteoarthritis, hypertension, obesity, hyperlipidemia, cataracts withstatus post surgeries, who presents for follow-up consultation of his right-sided predominant Parkinson's disease,complicated by mild RBDin the past, some daytimesleepiness, mild memory loss with Sx dating back to around 5/14 and RBD symptoms preceding motor symptoms by perhaps 12-15 years even. He has donefairly well over the lastfewyears. But he has had progression in his motor function and balance issues, also developed mild hallucinations.  He has significant right knee osteoarthritis and is in need for a knee replacement surgery.  I explained to him that there is no obvious contraindication from the neurological standpoint in pursuing elective knee replacement surgery but he certainly can have some issues and a longer recovery time what with change in scheduling, lack of mobility, need for pain medication which can cause exacerbation and hallucinations and exacerbation and constipation. He is scheduled for his second Covid shot on 02/21/2020.  I suggested that they call his orthopedic surgeon and see if his surgery could be planned 2 to 3 weeks out  from his second shot if possible. He has had some memory loss over time.  He has had more daytime somnolence and has developed visual hallucinations as well as mild dyskinesias.  We increased his Sinemet to 1 pill 5 times a day in 2020.We reduced his Mirapex from 0.75 mg 3 times daily to 0.5 mg 3 times daily in October  2020. Possible future considerations include Nuplazid for PD associated Hallucinations and delusions as well as Nourianz, A novel medication, adenosine receptor antagonist for Parkinson's disease, approved for patients experiencing off times. He was also on Azilect in the past but this was discontinued in March 2018 due to cost.  For now, we will continue with Sinemet 5 times a day and Mirapex 0.5 mg tid. He is advised to stay well-hydrated and change positions slowly.  We talked about the importance of fall prevention. I suggested a 39-monthfollow-up, sooner if needed.  I answered all their questions today and the patient and his wife were in agreement. I spent 30 minutes in total face-to-face time and in reviewing records during pre-charting, more than 50% of which was spent in counseling and coordination of care, reviewing test results, reviewing medications and treatment regimen and/or in discussing or reviewing the diagnosis of PD, the prognosis and treatment options. Pertinent laboratory and imaging test results that were available during this visit with the patient were reviewed by me and considered in my medical decision making (see chart for details).

## 2020-02-11 NOTE — Patient Instructions (Signed)
As discussed, we will leave your medications the same for your Parkinson's disease, we will continue to monitor your memory and your hallucinations. Having Parkinson's disease is not a contraindication for surgery. However, we have to be aware, that patients with Parkinson's disease can have a longer recovery time and some setback with their Parkinson's symptoms, typically linked to irregularities to their medication timing and also due to having to take pain medication and secondary to temporarily being more immobile.  I hope you do well with your Knee replacement surgery.  I am glad you were able to get your Covid vaccine and hope you do well with the second dose coming up.

## 2020-02-15 ENCOUNTER — Telehealth: Payer: Self-pay | Admitting: Neurology

## 2020-02-15 NOTE — Telephone Encounter (Addendum)
Pt's wife called wanting to know if the forms that she left the provider have been filled out regarding his knee surgery. Form needs to be faxed to [facility] at [phone] Please call wife when forms have been sent.

## 2020-02-15 NOTE — Telephone Encounter (Signed)
Form was sent on 02/11/2020 and confirmation received.

## 2020-02-21 ENCOUNTER — Ambulatory Visit: Payer: Medicare Other | Attending: Internal Medicine

## 2020-02-21 DIAGNOSIS — Z23 Encounter for immunization: Secondary | ICD-10-CM | POA: Insufficient documentation

## 2020-02-21 NOTE — Progress Notes (Signed)
   Covid-19 Vaccination Clinic  Name:  ANWAR BAACK    MRN: [id number] DOB: 11/21/1951  02/21/2020  Mr. Adger was observed post Covid-19 immunization for 15 minutes without incident. He was provided with Vaccine Information Sheet and instruction to access the V-Safe system.   Mr. Briggs was instructed to call 911 with any severe reactions post vaccine: Marland Kitchen Difficulty breathing  . Swelling of face and throat  . A fast heartbeat  . A bad rash all over body  . Dizziness and weakness   Immunizations Administered    Name Date Dose VIS Date Route   Pfizer COVID-19 Vaccine 02/21/2020 12:27 PM 0.3 mL 11/27/2019 Intramuscular   Manufacturer: [employer]   Lot: [id number]   [place]: [id number]

## 2020-02-25 ENCOUNTER — Ambulatory Visit (INDEPENDENT_AMBULATORY_CARE_PROVIDER_SITE_OTHER): Payer: Medicare Other | Admitting: Orthopaedic Surgery

## 2020-02-25 ENCOUNTER — Telehealth: Payer: Self-pay

## 2020-02-25 ENCOUNTER — Other Ambulatory Visit: Payer: Self-pay

## 2020-02-25 DIAGNOSIS — M1711 Unilateral primary osteoarthritis, right knee: Secondary | ICD-10-CM | POA: Diagnosis not present

## 2020-02-25 NOTE — Progress Notes (Signed)
Office Visit Note   Patient: Danny [name]           Date of Birth: 05-Jul-1951           MRN: [id number] Visit Date: 02/25/2020              Requested by: [name], [place],  [place] [id number] PCP: [name], PA   TOTAL KNEE ADMISSION H&P  Patient is being admitted for right total knee arthroplasty.  Subjective:  Chief Complaint:right knee pain.  HPI: Danny [name], 69 y.o. male, has a history of pain and functional disability in the right knee due to osteoarthritis of the right knee and has failed non-surgical conservative treatments for greater than 12 weeks to includeNSAID's and/or analgesics, corticosteriod injections, use of assistive devices and activity modification.  Onset of symptoms was gradual, starting 3 years ago with gradually worsening course since that time. The patient noted no past surgery on the right knee(s).  Patient currently rates pain in the right knee(s) at 7 out of 10 with activity. Patient has night pain, worsening of pain with activity and weight bearing, pain that interferes with activities of daily living, crepitus and joint swelling.  Patient has evidence of subchondral sclerosis, periarticular osteophytes and joint space narrowing by imaging studies. There is no active infection.  Patient Active Problem List   Diagnosis Date Noted  . Hematospermia 11/18/2015  . Epididymal cyst 05/25/2015  . Parkinson disease ([place]) 01/14/2014  . Obesity (BMI 30-39.9) 07/14/2013  . Confusional arousals 02/13/2013  . Disorder of ligament of right wrist 01/29/2012  . Diabetes mellitus type 2, uncomplicated ([facility]) 99991111  . Mixed hyperlipidemia 10/09/2011  . OA (osteoarthritis) of knee 10/09/2011  . Essential hypertension, benign 10/09/2011   Past Medical History:  Diagnosis Date  . Allergy    SEASONAL  . Cataract    BILATERAL-REMOVED  . Essential hypertension, benign    PT.DENIES ON AS PREVENTIVE 08/17/19  .  GERD (gastroesophageal reflux disease)   . Hx of adenomatous polyp of colon 09/04/2019   08/2019 diminutive adenoma No recall given findings and age  . Mixed hyperlipidemia    PT.DENIES,STATED ON AS PREVENTIVE 07/30/19  . Neuromuscular disorder ([facility]CC)    PARKINSONS  . OA (osteoarthritis) of knee    right  . Obesity, unspecified   . Osteoarthritis    KNEE AND SHOULDERS  . Parkinsons ([facility])   . Type II or unspecified type diabetes mellitus without mention of complication, not stated as uncontrolled     Past Surgical History:  Procedure Laterality Date  . CATARACT EXTRACTION, BILATERAL    . GANGLION CYST EXCISION  age 16 years   right  . KNEE ARTHROSCOPY     right  . KNEE ARTHROSCOPY    . WRIST FRACTURE SURGERY    . WRIST SURGERY  04/2012    Current Facility-Administered Medications  Medication Dose Route Frequency Provider Last Rate Last Admin  . 0.9 %  sodium chloride infusion  500 mL Intravenous Once Gatha Mayer, MD       Current Outpatient Medications  Medication Sig Dispense Refill Last Dose  . Ascorbic Acid (VITAMIN C ADULT GUMMIES PO) Take 2 tablets by mouth daily.      Marland Kitchen aspirin 81 MG tablet Take 81 mg by mouth daily.       . carbidopa-levodopa (SINEMET IR) 25-100 MG tablet Take 1 tablet by mouth 5 (five) times daily. 450 tablet 3   .  Cinnamon 500 MG capsule Take 500 mg by mouth daily.     . enalapril (VASOTEC) 2.5 MG tablet Take 1 tablet by mouth  daily (Patient taking differently: Take 2.5 mg by mouth daily. ) 90 tablet 1   . famotidine (PEPCID) 20 MG tablet Take 20 mg by mouth in the morning and at bedtime.      . Ginger, Zingiber officinalis, 550 MG CAPS Take 550 mg by mouth daily.       Marland Kitchen glipiZIDE (GLUCOTROL XL) 10 MG 24 hr tablet TAKE 1 TABLET BY MOUTH DAILY (Patient taking differently: Take 10 mg by mouth at bedtime. ) 90 tablet 3   . Glucosamine-Chondroit-Vit C-Mn (GLUCOSAMINE CHONDR 1500 COMPLX PO) Take 1,500 mg by mouth daily.       Marland Kitchen ketoconazole (NIZORAL) 2 %  cream Apply 1 application topically 2 (two) times daily.      . Multiple Vitamin (MULTIVITAMIN) tablet Take 1 tablet by mouth daily.     . pramipexole (MIRAPEX) 0.5 MG tablet Take 1 tablet (0.5 mg total) by mouth 3 (three) times daily. 270 tablet 3   . simvastatin (ZOCOR) 40 MG tablet TAKE 0.5 TABLETS (20 MG TOTAL) BY MOUTH DAILY. (Patient taking differently: Take 20 mg by mouth every other day. ) 45 tablet 1   . sitaGLIPtin-metformin (JANUMET) 50-1000 MG tablet Take 1 tablet by mouth two  times daily with meals (Patient taking differently: Take 1 tablet by mouth 2 (two) times daily with a meal. ) 180 tablet 3   . glucose blood test strip Use as instructed 300 each 4   . metFORMIN (GLUCOPHAGE) 1000 MG tablet Take 1,000 mg by mouth 2 (two) times daily.      Allergies  Allergen Reactions  . Codeine Nausea And Vomiting    Social History   Tobacco Use  . Smoking status: Former Smoker    Packs/day: 1.50    Years: 15.00    Pack years: 22.50    Types: Cigarettes    Quit date: 12/17/1982    Years since quitting: 37.2  . Smokeless tobacco: Never Used  . Tobacco comment: over 40 yrs  Substance Use Topics  . Alcohol use: Yes    Comment: very rare    Family History  Problem Relation Age of Onset  . Diabetes Sister   . Diabetes Daughter   . Cancer Father   . Heart disease Father   . Colon polyps Son   . Diabetes Mother   . Lung cancer Mother   . Colon cancer Neg Hx   . Esophageal cancer Neg Hx   . Rectal cancer Neg Hx   . Stomach cancer Neg Hx      Review of Systems  Constitutional: Negative for activity change, appetite change, chills, diaphoresis, fatigue, fever and unexpected weight change.  HENT: Negative for congestion, dental problem, ear discharge, ear pain, mouth sores, postnasal drip, rhinorrhea, sinus pressure, sinus pain, sneezing, sore throat, tinnitus and voice change.  Eyes: Negative for visual disturbance.  Respiratory: Negative for cough, chest tightness, shortness of  breath and wheezing.  Cardiovascular: Negative for chest pain, palpitations and leg swelling.  Gastrointestinal: Negative for abdominal pain, blood in stool, constipation, diarrhea, nausea and vomiting.  Endocrine: Negative for cold intolerance, heat intolerance, polydipsia, polyphagia and polyuria.  Genitourinary: Negative for dysuria, frequency, hematuria and urgency.  Musculoskeletal: Positive for arthralgias. Negative for back pain, gait problem, joint swelling and myalgias.  Skin: Negative for rash.  Allergic/Immunologic: Negative for immunocompromised state.  Neurological: Positive for weakness. Negative for dizziness, light-headedness and headaches.  Hematological: Negative for adenopathy. Does not bruise/bleed easily.  Psychiatric/Behavioral: Negative for dysphoric mood and sleep disturbance. The patient is not nervous/anxious.    Objective:  Physical Exam  Constitutional: He is oriented to person, place, and time. He appears well-developed and well-nourished.  HENT:  Head: Normocephalic and atraumatic.  Eyes: Pupils are equal, round, and reactive to light. EOM are normal.  Neck: No thyromegaly present.  Cardiovascular: Normal rate, regular rhythm and intact distal pulses.  Respiratory: Effort normal and breath sounds normal.  GI: Soft. Bowel sounds are normal. There is abdominal tenderness.  Musculoskeletal:     Cervical back: Neck supple.  Neurological: He is alert and oriented to person, place, and time.  Skin: Skin is warm and dry.  Psychiatric: He has a normal mood and affect. His behavior is normal. Judgment and thought content normal.  Knee: Right mild effusion, crepitus with ROM, PSEUDOLAXITY WITH VALGUS STRESSING.  Vital signs in last 24 hours: BP 115/80  P 75      Labs:   Estimated body mass index is 32 kg/m as calculated from the following:   Height as of 02/11/20: 5\' 10"  (1.778 m).   Weight as of 02/11/20: 101.2 kg.   Imaging Review Plain radiographs  demonstrate moderate degenerative joint disease of the right knee(s). The overall alignment ismild valgus. The bone quality appears to be good for age and reported activity level.      Assessment/Plan:  End stage arthritis, right knee   The patient history, physical examination, clinical judgment of the provider and imaging studies are consistent with end stage degenerative joint disease of the right knee(s) and total knee arthroplasty is deemed medically necessary. The treatment options including medical management, injection therapy arthroscopy and arthroplasty were discussed at length. The risks and benefits of total knee arthroplasty were presented and reviewed. The risks due to aseptic loosening, infection, stiffness, patella tracking problems, thromboembolic complications and other imponderables were discussed. The patient acknowledged the explanation, agreed to proceed with the plan and consent was signed. Patient is being admitted for inpatient treatment for surgery, pain control, PT, OT, prophylactic antibiotics, VTE prophylaxis, progressive ambulation and ADL's and discharge planning. The patient is planning to be discharged home with home health services     Patient's anticipated LOS is less than 2 midnights, meeting these requirements: - Younger than 66 - Lives within 1 hour of care - Has a competent adult at home to recover with post-op recover - NO history of  - Chronic pain requiring opiods  - Diabetes  - Coronary Artery Disease  - Heart failure  - Heart attack  - Stroke  - DVT/VTE  - Cardiac arrhythmia  - Respiratory Failure/COPD  - Renal failure  - Anemia  - Advanced Liver disease  Danny Craze. Danny [name] [id number]  02/25/2020 1:41 PM

## 2020-02-25 NOTE — Telephone Encounter (Signed)
Pt wife stated she would like for you to call her back. She stated she hasnt heard anything about the Covid test. Her CB # [phone]

## 2020-02-25 NOTE — H&P (Signed)
TOTAL KNEE ADMISSION H&P  Patient is being admitted for right total knee arthroplasty.  Subjective:  Chief Complaint:right knee pain.  HPI: Danny [name], 69 y.o. male, has a history of pain and functional disability in the right knee due to osteoarthritis of the right knee and has failed non-surgical conservative treatments for greater than 12 weeks to includeNSAID's and/or analgesics, corticosteriod injections, use of assistive devices and activity modification.  Onset of symptoms was gradual, starting 3 years ago with gradually worsening course since that time. The patient noted no past surgery on the right knee(s).  Patient currently rates pain in the right knee(s) at 7 out of 10 with activity. Patient has night pain, worsening of pain with activity and weight bearing, pain that interferes with activities of daily living, crepitus and joint swelling.  Patient has evidence of subchondral sclerosis, periarticular osteophytes and joint space narrowing by imaging studies. There is no active infection.  Patient Active Problem List   Diagnosis Date Noted  . Hematospermia 11/18/2015  . Epididymal cyst 05/25/2015  . Parkinson disease ([facility]) 01/14/2014  . Obesity (BMI 30-39.9) 07/14/2013  . Confusional arousals 02/13/2013  . Disorder of ligament of right wrist 01/29/2012  . Diabetes mellitus type 2, uncomplicated ([facility]) 99991111  . Mixed hyperlipidemia 10/09/2011  . OA (osteoarthritis) of knee 10/09/2011  . Essential hypertension, benign 10/09/2011   Past Medical History:  Diagnosis Date  . Allergy    SEASONAL  . Cataract    BILATERAL-REMOVED  . Essential hypertension, benign    PT.DENIES ON AS PREVENTIVE 08/17/19  . GERD (gastroesophageal reflux disease)   . Hx of adenomatous polyp of colon 09/04/2019   08/2019 diminutive adenoma No recall given findings and age  . Mixed hyperlipidemia    PT.DENIES,STATED ON AS PREVENTIVE 07/30/19  . Neuromuscular disorder ([facility])    PARKINSONS  . OA  (osteoarthritis) of knee    right  . Obesity, unspecified   . Osteoarthritis    KNEE AND SHOULDERS  . Parkinsons ([place])   . Type II or unspecified type diabetes mellitus without mention of complication, not stated as uncontrolled     Past Surgical History:  Procedure Laterality Date  . CATARACT EXTRACTION, BILATERAL    . GANGLION CYST EXCISION  age 54 years   right  . KNEE ARTHROSCOPY     right  . KNEE ARTHROSCOPY    . WRIST FRACTURE SURGERY    . WRIST SURGERY  04/2012    Current Facility-Administered Medications  Medication Dose Route Frequency Provider Last Rate Last Admin  . 0.9 %  sodium chloride infusion  500 mL Intravenous Once Gatha Mayer, MD       Current Outpatient Medications  Medication Sig Dispense Refill Last Dose  . Ascorbic Acid (VITAMIN C ADULT GUMMIES PO) Take 2 tablets by mouth daily.      Marland Kitchen aspirin 81 MG tablet Take 81 mg by mouth daily.       . carbidopa-levodopa (SINEMET IR) 25-100 MG tablet Take 1 tablet by mouth 5 (five) times daily. 450 tablet 3   . Cinnamon 500 MG capsule Take 500 mg by mouth daily.     . enalapril (VASOTEC) 2.5 MG tablet Take 1 tablet by mouth  daily (Patient taking differently: Take 2.5 mg by mouth daily. ) 90 tablet 1   . famotidine (PEPCID) 20 MG tablet Take 20 mg by mouth in the morning and at bedtime.      . Ginger, Zingiber officinalis, 550 MG CAPS Take  550 mg by mouth daily.       Marland Kitchen glipiZIDE (GLUCOTROL XL) 10 MG 24 hr tablet TAKE 1 TABLET BY MOUTH DAILY (Patient taking differently: Take 10 mg by mouth at bedtime. ) 90 tablet 3   . Glucosamine-Chondroit-Vit C-Mn (GLUCOSAMINE CHONDR 1500 COMPLX PO) Take 1,500 mg by mouth daily.       Marland Kitchen ketoconazole (NIZORAL) 2 % cream Apply 1 application topically 2 (two) times daily.      . Multiple Vitamin (MULTIVITAMIN) tablet Take 1 tablet by mouth daily.     . pramipexole (MIRAPEX) 0.5 MG tablet Take 1 tablet (0.5 mg total) by mouth 3 (three) times daily. 270 tablet 3   . simvastatin (ZOCOR)  40 MG tablet TAKE 0.5 TABLETS (20 MG TOTAL) BY MOUTH DAILY. (Patient taking differently: Take 20 mg by mouth every other day. ) 45 tablet 1   . sitaGLIPtin-metformin (JANUMET) 50-1000 MG tablet Take 1 tablet by mouth two  times daily with meals (Patient taking differently: Take 1 tablet by mouth 2 (two) times daily with a meal. ) 180 tablet 3   . glucose blood test strip Use as instructed 300 each 4   . metFORMIN (GLUCOPHAGE) 1000 MG tablet Take 1,000 mg by mouth 2 (two) times daily.      Allergies  Allergen Reactions  . Codeine Nausea And Vomiting    Social History   Tobacco Use  . Smoking status: Former Smoker    Packs/day: 1.50    Years: 15.00    Pack years: 22.50    Types: Cigarettes    Quit date: 12/17/1982    Years since quitting: 37.2  . Smokeless tobacco: Never Used  . Tobacco comment: over 40 yrs  Substance Use Topics  . Alcohol use: Yes    Comment: very rare    Family History  Problem Relation Age of Onset  . Diabetes Sister   . Diabetes Daughter   . Cancer Father   . Heart disease Father   . Colon polyps Son   . Diabetes Mother   . Lung cancer Mother   . Colon cancer Neg Hx   . Esophageal cancer Neg Hx   . Rectal cancer Neg Hx   . Stomach cancer Neg Hx      Review of Systems  Constitutional: Negative for activity change, appetite change, chills, diaphoresis, fatigue, fever and unexpected weight change.  HENT: Negative for congestion, dental problem, ear discharge, ear pain, mouth sores, postnasal drip, rhinorrhea, sinus pressure, sinus pain, sneezing, sore throat, tinnitus and voice change.  Eyes: Negative for visual disturbance.  Respiratory: Negative for cough, chest tightness, shortness of breath and wheezing.  Cardiovascular: Negative for chest pain, palpitations and leg swelling.  Gastrointestinal: Negative for abdominal pain, blood in stool, constipation, diarrhea, nausea and vomiting.  Endocrine: Negative for cold intolerance, heat intolerance,  polydipsia, polyphagia and polyuria.  Genitourinary: Negative for dysuria, frequency, hematuria and urgency.  Musculoskeletal: Positive for arthralgias. Negative for back pain, gait problem, joint swelling and myalgias.  Skin: Negative for rash.  Allergic/Immunologic: Negative for immunocompromised state.  Neurological: Positive for weakness. Negative for dizziness, light-headedness and headaches.  Hematological: Negative for adenopathy. Does not bruise/bleed easily.  Psychiatric/Behavioral: Negative for dysphoric mood and sleep disturbance. The patient is not nervous/anxious.    Objective:  Physical Exam  Constitutional: He is oriented to person, place, and time. He appears well-developed and well-nourished.  HENT:  Head: Normocephalic and atraumatic.  Eyes: Pupils are equal, round, and reactive to light.  EOM are normal.  Neck: No thyromegaly present.  Cardiovascular: Normal rate, regular rhythm and intact distal pulses.  Respiratory: Effort normal and breath sounds normal.  GI: Soft. Bowel sounds are normal. There is abdominal tenderness.  Musculoskeletal:     Cervical back: Neck supple.  Neurological: He is alert and oriented to person, place, and time.  Skin: Skin is warm and dry.  Psychiatric: He has a normal mood and affect. His behavior is normal. Judgment and thought content normal.  Knee: Right mild effusion, crepitus with ROM, PSEUDOLAXITY WITH VALGUS STRESSING.  Vital signs in last 24 hours: BP 115/80  P 75      Labs:   Estimated body mass index is 32 kg/m as calculated from the following:   Height as of 02/11/20: 5\' 10"  (1.778 m).   Weight as of 02/11/20: 101.2 kg.   Imaging Review Plain radiographs demonstrate moderate degenerative joint disease of the right knee(s). The overall alignment ismild valgus. The bone quality appears to be good for age and reported activity level.      Assessment/Plan:  End stage arthritis, right knee   The patient history,  physical examination, clinical judgment of the provider and imaging studies are consistent with end stage degenerative joint disease of the right knee(s) and total knee arthroplasty is deemed medically necessary. The treatment options including medical management, injection therapy arthroscopy and arthroplasty were discussed at length. The risks and benefits of total knee arthroplasty were presented and reviewed. The risks due to aseptic loosening, infection, stiffness, patella tracking problems, thromboembolic complications and other imponderables were discussed. The patient acknowledged the explanation, agreed to proceed with the plan and consent was signed. Patient is being admitted for inpatient treatment for surgery, pain control, PT, OT, prophylactic antibiotics, VTE prophylaxis, progressive ambulation and ADL's and discharge planning. The patient is planning to be discharged home with home health services     Patient's anticipated LOS is less than 2 midnights, meeting these requirements: - Younger than 82 - Lives within 1 hour of care - Has a competent adult at home to recover with post-op recover - NO history of  - Chronic pain requiring opiods  - Diabetes  - Coronary Artery Disease  - Heart failure  - Heart attack  - Stroke  - DVT/VTE  - Cardiac arrhythmia  - Respiratory Failure/COPD  - Renal failure  - Anemia  - Advanced Liver disease  Mike Craze. Micheal Likens [id number]  02/25/2020 1:41 PM

## 2020-02-26 ENCOUNTER — Other Ambulatory Visit: Payer: Self-pay

## 2020-02-26 NOTE — Patient Instructions (Signed)
DUE TO COVID-19 ONLY ONE VISITOR IS ALLOWED TO COME WITH YOU AND STAY IN THE WAITING ROOM ONLY DURING PRE OP AND PROCEDURE DAY OF SURGERY. THE 1 VISITOR MAY VISIT WITH YOU AFTER SURGERY IN YOUR PRIVATE ROOM DURING VISITING HOURS ONLY!  YOU NEED TO HAVE A COVID 19 TEST ON_3/19______ @_10 :30______, THIS TEST MUST BE DONE BEFORE SURGERY, COME  [name] , [id number].  ([place]) ONCE YOUR COVID TEST IS COMPLETED, PLEASE BEGIN THE QUARANTINE INSTRUCTIONS AS OUTLINED IN YOUR HANDOUT.                [name]   Your procedure is scheduled on: 03/08/20   Report to [facility] Main  Entrance   Report to Short Stay at 5:30 AM     Call this number if you have problems the morning of surgery [name], NO CHEWING GUM [name].  Do not eat food After Midnight.   YOU MAY HAVE CLEAR LIQUIDS FROM MIDNIGHT UNTIL 4:30AM.   At 4:30AM Please finish the prescribed Pre-Surgery Gatorade drink  . Nothing by mouth after you finish the Gatorade drink !      Take these medicines the morning of surgery with A SIP OF WATER: Carbidopa-levodopa, Pramipexole  DO NOT TAKE ANY DIABETIC MEDICATIONS DAY OF YOUR SURGERY    How to Manage Your Diabetes Before and After Surgery  Why is it important to control my blood sugar before and after surgery? . Improving blood sugar levels before and after surgery helps healing and can limit problems. . A way of improving blood sugar control is eating a healthy diet by: o  Eating less sugar and carbohydrates o  Increasing activity/exercise o  Talking with your doctor about reaching your blood sugar goals . High blood sugars (greater than 180 mg/dL) can raise your risk of infections and slow your recovery, so you will need to focus on controlling your diabetes during the weeks before surgery. . Make sure that the doctor who takes care of your diabetes  knows about your planned surgery including the date and location.  How do I manage my blood sugar before surgery? . Check your blood sugar at least 4 times a day, starting 2 days before surgery, to make sure that the level is not too high or low. o Check your blood sugar the morning of your surgery when you wake up and every 2 hours until you get to the Short Stay unit. . If your blood sugar is less than 70 mg/dL, you will need to treat for low blood sugar: o Do not take insulin. o Treat a low blood sugar (less than 70 mg/dL) with  cup of clear juice (cranberry or apple), 4 glucose tablets, OR glucose gel. o Recheck blood sugar in 15 minutes after treatment (to make sure it is greater than 70 mg/dL). If your blood sugar is not greater than 70 mg/dL on recheck, call [phone] for further instructions. . Report your blood sugar to the short stay nurse when you get to Short Stay.  . If you are admitted to the hospital after surgery: o Your blood sugar will be checked by the staff and you will probably be given insulin after surgery (instead of oral diabetes medicines) to make sure you have good blood sugar levels. o The goal for blood sugar control after surgery is 80-180 mg/dL.  WHAT DO I DO ABOUT MY DIABETES MEDICATION?  Marland Kitchen Do not take oral diabetes medicines (pills) the morning of surgery.                            You may not have any metal on your body including              piercings  Do not wear jewelry,  lotions, powders or  deodorant                 Men may shave face and neck.   Do not bring valuables to the hospital. [name].  Contacts, dentures or bridgework may not be worn into surgery.       Special Instructions: N/A              Please read over the following fact sheets you were given: _____________________________________________________________________             [name]  Before surgery, you can play an important role.   Because skin is not sterile, your skin needs to be as free of germs as possible.  You can reduce the number of germs on your skin by washing with CHG (chlorahexidine gluconate) soap before surgery.   CHG is an antiseptic cleaner which kills germs and bonds with the skin to continue killing germs even after washing. Please DO NOT use if you have an allergy to CHG or antibacterial soaps.   If your skin becomes reddened/irritated stop using the CHG and inform your nurse when you arrive at Short Stay. r.  You may shave your face/neck.  Please follow these instructions carefully:  1.  Shower with CHG Soap the night before surgery and the  morning of Surgery.  2.  If you choose to wash your hair, wash your hair first as usual with your  normal  shampoo.  3.  After you shampoo, rinse your hair and body thoroughly to remove the  shampoo.                                       4.  Use CHG as you would any other liquid soap.  You can apply chg directly  to the skin and wash                       Gently with a scrungie or clean washcloth.  5.  Apply the CHG Soap to your body ONLY FROM THE NECK DOWN.   Do not use on face/ open                           Wound or open sores. Avoid contact with eyes, ears mouth and genitals (private parts).                       Wash face,  Genitals (private parts) with your normal soap.             6.  Wash thoroughly, paying special attention to the area where your surgery  will be performed.  7.  Thoroughly rinse your body with warm water from the neck down.  8.  DO NOT shower/wash with your normal soap after using and rinsing off  the CHG Soap.             9.  Pat yourself dry with a clean towel.            10.  Wear clean pajamas.            11.  Place clean sheets on your bed the night of your first shower and do not  sleep with pets. Day of Surgery : Do not apply any lotions/deodorants the morning of  surgery.  Please wear clean clothes to the hospital/surgery center.  FAILURE TO FOLLOW THESE INSTRUCTIONS MAY RESULT IN THE CANCELLATION OF YOUR SURGERY PATIENT SIGNATURE_________________________________  NURSE SIGNATURE__________________________________  ________________________________________________________________________   Danny [name]  An incentive spirometer is a tool that can help keep your lungs clear and active. This tool measures how well you are filling your lungs with each breath. Taking long deep breaths may help reverse or decrease the chance of developing breathing (pulmonary) problems (especially infection) following:  A long period of time when you are unable to move or be active. BEFORE THE PROCEDURE   If the spirometer includes an indicator to show your best effort, your nurse or respiratory therapist will set it to a desired goal.  If possible, sit up straight or lean slightly forward. Try not to slouch.  Hold the incentive spirometer in an upright position. INSTRUCTIONS FOR USE  1. Sit on the edge of your bed if possible, or sit up as far as you can in bed or on a chair. 2. Hold the incentive spirometer in an upright position. 3. Breathe out normally. 4. Place the mouthpiece in your mouth and seal your lips tightly around it. 5. Breathe in slowly and as deeply as possible, raising the piston or the ball toward the top of the column. 6. Hold your breath for 3-5 seconds or for as long as possible. Allow the piston or ball to fall to the bottom of the column. 7. Remove the mouthpiece from your mouth and breathe out normally. 8. Rest for a few seconds and repeat Steps 1 through 7 at least 10 times every 1-2 hours when you are awake. Take your time and take a few normal breaths between deep breaths. 9. The spirometer may include an indicator to show your best effort. Use the indicator as a goal to work toward during each repetition. 10. After each set of 10  deep breaths, practice coughing to be sure your lungs are clear. If you have an incision (the cut made at the time of surgery), support your incision when coughing by placing a pillow or rolled up towels firmly against it. Once you are able to get out of bed, walk around indoors and cough well. You may stop using the incentive spirometer when instructed by your caregiver.  RISKS AND COMPLICATIONS  Take your time so you do not get dizzy or light-headed.  If you are in pain, you may need to take or ask for pain medication before doing incentive spirometry. It is harder to take a deep breath if you are having pain. AFTER USE  Rest and breathe slowly and easily.  It can be helpful to keep track of a log of your progress. Your caregiver can provide you with a simple table to help with this. If you are using the spirometer at home, follow these instructions: [place] IF:   You are having  difficultly using the spirometer.  You have trouble using the spirometer as often as instructed.  Your pain medication is not giving enough relief while using the spirometer.  You develop fever of 100.5 F (38.1 C) or higher. SEEK IMMEDIATE MEDICAL CARE IF:   You cough up bloody sputum that had not been present before.  You develop fever of 102 F (38.9 C) or greater.  You develop worsening pain at or near the incision site. MAKE SURE YOU:   Understand these instructions.  Will watch your condition.  Will get help right away if you are not doing well or get worse. Document Released: 04/15/2007 Document Revised: 02/25/2012 Document Reviewed: 06/16/2007 Exit[employer] Patient Information 2014 [employer], [place].   ________________________________________________________________________  WHAT IS A BLOOD TRANSFUSION? Blood Transfusion Information  A transfusion is the replacement of blood or some of its parts. Blood is made up of multiple cells which provide different functions.  Red blood cells  carry oxygen and are used for blood loss replacement.  White blood cells fight against infection.  Platelets control bleeding.  Plasma helps clot blood.  Other blood products are available for specialized needs, such as hemophilia or other clotting disorders. BEFORE THE TRANSFUSION  Who gives blood for transfusions?   Healthy volunteers who are fully evaluated to make sure their blood is safe. This is blood bank blood. Transfusion therapy is the safest it has ever been in the practice of medicine. Before blood is taken from a donor, a complete history is taken to make sure that person has no history of diseases nor engages in risky social behavior (examples are intravenous drug use or sexual activity with multiple partners). The donor's travel history is screened to minimize risk of transmitting infections, such as malaria. The donated blood is tested for signs of infectious diseases, such as HIV and hepatitis. The blood is then tested to be sure it is compatible with you in order to minimize the chance of a transfusion reaction. If you or a relative donates blood, this is often done in anticipation of surgery and is not appropriate for emergency situations. It takes many days to process the donated blood. RISKS AND COMPLICATIONS Although transfusion therapy is very safe and saves many lives, the main dangers of transfusion include:   Getting an infectious disease.  Developing a transfusion reaction. This is an allergic reaction to something in the blood you were given. Every precaution is taken to prevent this. The decision to have a blood transfusion has been considered carefully by your caregiver before blood is given. Blood is not given unless the benefits outweigh the risks. AFTER THE TRANSFUSION  Right after receiving a blood transfusion, you will usually feel much better and more energetic. This is especially true if your red blood cells have gotten low (anemic). The transfusion raises  the level of the red blood cells which carry oxygen, and this usually causes an energy increase.  The nurse administering the transfusion will monitor you carefully for complications. HOME CARE INSTRUCTIONS  No special instructions are needed after a transfusion. You may find your energy is better. Speak with your caregiver about any limitations on activity for underlying diseases you may have. SEEK MEDICAL CARE IF:   Your condition is not improving after your transfusion.  You develop redness or irritation at the intravenous (IV) site. SEEK IMMEDIATE MEDICAL CARE IF:  Any of the following symptoms occur over the next 12 hours:  Shaking chills.  You have a temperature by mouth above  102 F (38.9 C), not controlled by medicine.  Chest, back, or muscle pain.  People around you feel you are not acting correctly or are confused.  Shortness of breath or difficulty breathing.  Dizziness and fainting.  You get a rash or develop hives.  You have a decrease in urine output.  Your urine turns a dark color or changes to pink, red, or brown. Any of the following symptoms occur over the next 10 days:  You have a temperature by mouth above 102 F (38.9 C), not controlled by medicine.  Shortness of breath.  Weakness after normal activity.  The white part of the eye turns yellow (jaundice).  You have a decrease in the amount of urine or are urinating less often.  Your urine turns a dark color or changes to pink, red, or brown. Document Released: 11/30/2000 Document Revised: 02/25/2012 Document Reviewed: 07/19/2008 [facility] Patient Information 2014 [place], [place].  _______________________________________________________________________

## 2020-02-29 ENCOUNTER — Other Ambulatory Visit: Payer: Self-pay

## 2020-02-29 ENCOUNTER — Encounter (HOSPITAL_COMMUNITY)
Admission: RE | Admit: 2020-02-29 | Discharge: 2020-02-29 | Disposition: A | Discharge status: Home / Self Care | Payer: Medicare Other | Source: Ambulatory Visit | Attending: Orthopaedic Surgery | Admitting: Orthopaedic Surgery

## 2020-02-29 ENCOUNTER — Encounter (HOSPITAL_COMMUNITY): Payer: Self-pay

## 2020-02-29 DIAGNOSIS — Z01818 Encounter for other preprocedural examination: Secondary | ICD-10-CM | POA: Diagnosis present

## 2020-02-29 NOTE — Progress Notes (Signed)
PCP - C. Jeffry PA Cardiologist - none  Chest x-ray - no EKG - 03/01/20 Stress Test - no ECHO - no Cardiac Cath -no   Sleep Study - yes- mild CPAP - no  Fasting Blood Sugar - 95-110 Checks Blood Sugar _QD____ times a day  Blood Thinner Instructions:ASA Aspirin Instructions:Dr. Durward Fortes said to stop 3/17 @! Last Dose:03/02/20  Anesthesia review:   Patient denies shortness of breath, fever, cough and chest pain at PAT appointment yes  Patient verbalized understanding of instructions that were given to them at the PAT appointment. Patient was also instructed that they will need to review over the PAT instructions again at home before surgery. yes

## 2020-03-01 ENCOUNTER — Encounter (HOSPITAL_COMMUNITY)
Admission: RE | Admit: 2020-03-01 | Discharge: 2020-03-01 | Disposition: A | Discharge status: Home / Self Care | Payer: Medicare Other | Source: Ambulatory Visit | Attending: Orthopaedic Surgery | Admitting: Orthopaedic Surgery

## 2020-03-01 DIAGNOSIS — Z01818 Encounter for other preprocedural examination: Secondary | ICD-10-CM | POA: Diagnosis not present

## 2020-03-01 LAB — COMPREHENSIVE METABOLIC PANEL
ALT: 10 U/L (ref 0–44)
AST: 20 U/L (ref 15–41)
Albumin: 3.9 g/dL (ref 3.5–5.0)
Alkaline Phosphatase: 48 U/L (ref 38–126)
Anion gap: 9 (ref 5–15)
BUN: 22 mg/dL (ref 8–23)
CO2: 24 mmol/L (ref 22–32)
Calcium: 9.1 mg/dL (ref 8.9–10.3)
Chloride: 105 mmol/L (ref 98–111)
Creatinine, Ser: 1.38 mg/dL — ABNORMAL HIGH (ref 0.61–1.24)
GFR calc Af Amer: >60 mL/min (ref >60)
GFR calc non Af Amer: 52 mL/min — ABNORMAL LOW (ref >60)
Glucose, Bld: 171 mg/dL — ABNORMAL HIGH (ref 70–99)
Potassium: 4.4 mmol/L (ref 3.5–5.1)
Sodium: 138 mmol/L (ref 135–145)
Total Bilirubin: 1.3 mg/dL — ABNORMAL HIGH (ref 0.3–1.2)
Total Protein: 6.9 g/dL (ref 6.5–8.1)

## 2020-03-01 LAB — PROTIME-INR
INR: 1 (ref 0.8–1.2)
Prothrombin Time: 13.3 seconds (ref 11.4–15.2)

## 2020-03-01 LAB — CBC WITH DIFFERENTIAL/PLATELET
Abs Immature Granulocytes: 0.05 10*3/uL (ref 0.00–0.07)
Basophils Absolute: 0.1 10*3/uL (ref 0.0–0.1)
Basophils Relative: 1 %
Eosinophils Absolute: 0.2 10*3/uL (ref 0.0–0.5)
Eosinophils Relative: 3 %
HCT: 41.7 % (ref 39.0–52.0)
Hemoglobin: 13.5 g/dL (ref 13.0–17.0)
Immature Granulocytes: 1 %
Lymphocytes Relative: 21 %
Lymphs Abs: 1.8 10*3/uL (ref 0.7–4.0)
MCH: 31.3 pg (ref 26.0–34.0)
MCHC: 32.4 g/dL (ref 30.0–36.0)
MCV: 96.8 fL (ref 80.0–100.0)
Monocytes Absolute: 0.5 10*3/uL (ref 0.1–1.0)
Monocytes Relative: 6 %
Neutro Abs: 6 10*3/uL (ref 1.7–7.7)
Neutrophils Relative %: 68 %
Platelets: 215 10*3/uL (ref 150–400)
RBC: 4.31 MIL/uL (ref 4.22–5.81)
RDW: 14.4 % (ref 11.5–15.5)
WBC: 8.7 10*3/uL (ref 4.0–10.5)
nRBC: 0 % (ref 0.0–0.2)

## 2020-03-01 LAB — APTT: aPTT: 24 seconds (ref 24–36)

## 2020-03-01 LAB — URINALYSIS, ROUTINE W REFLEX MICROSCOPIC
Bilirubin Urine: NEGATIVE
Glucose, UA: NEGATIVE mg/dL
Hgb urine dipstick: NEGATIVE
Ketones, ur: 5 mg/dL — AB
Leukocytes,Ua: NEGATIVE
Nitrite: NEGATIVE
Protein, ur: NEGATIVE mg/dL
Specific Gravity, Urine: 1.024 (ref 1.005–1.030)
pH: 5 (ref 5.0–8.0)

## 2020-03-01 LAB — SURGICAL PCR SCREEN
MRSA, PCR: NEGATIVE
Staphylococcus aureus: NEGATIVE

## 2020-03-01 LAB — GLUCOSE, CAPILLARY: Glucose-Capillary: 159 mg/dL — ABNORMAL HIGH (ref 70–99)

## 2020-03-01 LAB — ABO/RH: ABO/RH(D): A POS

## 2020-03-02 LAB — URINE CULTURE

## 2020-03-04 ENCOUNTER — Other Ambulatory Visit (HOSPITAL_COMMUNITY)
Admission: RE | Admit: 2020-03-04 | Discharge: 2020-03-04 | Disposition: A | Discharge status: Home / Self Care | Payer: Medicare Other | Source: Ambulatory Visit | Attending: Orthopaedic Surgery | Admitting: Orthopaedic Surgery

## 2020-03-04 DIAGNOSIS — Z01812 Encounter for preprocedural laboratory examination: Secondary | ICD-10-CM | POA: Diagnosis present

## 2020-03-04 DIAGNOSIS — Z20822 Contact with and (suspected) exposure to covid-19: Secondary | ICD-10-CM | POA: Diagnosis not present

## 2020-03-04 LAB — SARS CORONAVIRUS 2 (TAT 6-24 HRS): SARS Coronavirus 2: NEGATIVE

## 2020-03-07 NOTE — Anesthesia Preprocedure Evaluation (Addendum)
Anesthesia Evaluation  Patient identified by MRN, date of birth, ID band Patient awake    Reviewed: Allergy & Precautions, NPO status , Patient's Chart, lab work & pertinent test results  History of Anesthesia Complications Negative for: history of anesthetic complications  Airway Mallampati: II  TM Distance: >3 FB     Dental no notable dental hx. (+) Dental Advisory Given   Pulmonary former smoker,    Pulmonary exam normal        Cardiovascular hypertension, Pt. on medications Normal cardiovascular exam     Neuro/Psych Parkinson's Dz    GI/Hepatic Neg liver ROS, GERD  ,  Endo/Other  diabetes  Renal/GU negative Renal ROS     Musculoskeletal negative musculoskeletal ROS (+)   Abdominal   Peds  Hematology negative hematology ROS (+)   Anesthesia Other Findings Day of surgery medications reviewed with the patient.  Reproductive/Obstetrics                            Anesthesia Physical Anesthesia Plan  ASA: III  Anesthesia Plan: Spinal   Post-op Pain Management:  Regional for Post-op pain   Induction:   PONV Risk Score and Plan: 2 and Ondansetron and Propofol infusion  Airway Management Planned: Natural Airway  Additional Equipment:   Intra-op Plan:   Post-operative Plan:   Informed Consent: I have reviewed the patients History and Physical, chart, labs and discussed the procedure including the risks, benefits and alternatives for the proposed anesthesia with the patient or authorized representative who has indicated his/her understanding and acceptance.     Dental advisory given  Plan Discussed with: Anesthesiologist and CRNA  Anesthesia Plan Comments:        Anesthesia Quick Evaluation

## 2020-03-08 ENCOUNTER — Encounter (HOSPITAL_COMMUNITY): Payer: Self-pay | Admitting: Orthopaedic Surgery

## 2020-03-08 ENCOUNTER — Other Ambulatory Visit: Payer: Self-pay

## 2020-03-08 ENCOUNTER — Ambulatory Visit (HOSPITAL_COMMUNITY): Payer: Medicare Other | Admitting: Physician Assistant

## 2020-03-08 ENCOUNTER — Encounter (HOSPITAL_COMMUNITY)
Admission: RE | Disposition: A | Discharge status: Skilled Nursing Facility | Payer: Self-pay | Source: Ambulatory Visit | Attending: Orthopaedic Surgery

## 2020-03-08 ENCOUNTER — Ambulatory Visit (HOSPITAL_COMMUNITY): Payer: Medicare Other | Admitting: Anesthesiology

## 2020-03-08 ENCOUNTER — Inpatient Hospital Stay (HOSPITAL_COMMUNITY)
Admission: RE | Admit: 2020-03-08 | Discharge: 2020-03-11 | DRG: 470 | Disposition: A | Discharge status: Skilled Nursing Facility | Payer: Medicare Other | Source: Ambulatory Visit | Attending: Orthopaedic Surgery | Admitting: Orthopaedic Surgery

## 2020-03-08 DIAGNOSIS — Z885 Allergy status to narcotic agent status: Secondary | ICD-10-CM

## 2020-03-08 DIAGNOSIS — G20 Parkinson's disease: Secondary | ICD-10-CM | POA: Diagnosis present

## 2020-03-08 DIAGNOSIS — E119 Type 2 diabetes mellitus without complications: Secondary | ICD-10-CM | POA: Diagnosis present

## 2020-03-08 DIAGNOSIS — Z6832 Body mass index (BMI) 32.0-32.9, adult: Secondary | ICD-10-CM

## 2020-03-08 DIAGNOSIS — Z8371 Family history of colonic polyps: Secondary | ICD-10-CM

## 2020-03-08 DIAGNOSIS — Z7984 Long term (current) use of oral hypoglycemic drugs: Secondary | ICD-10-CM

## 2020-03-08 DIAGNOSIS — Z8249 Family history of ischemic heart disease and other diseases of the circulatory system: Secondary | ICD-10-CM

## 2020-03-08 DIAGNOSIS — I509 Heart failure, unspecified: Secondary | ICD-10-CM | POA: Diagnosis present

## 2020-03-08 DIAGNOSIS — Z96651 Presence of right artificial knee joint: Secondary | ICD-10-CM

## 2020-03-08 DIAGNOSIS — I110 Hypertensive heart disease with heart failure: Secondary | ICD-10-CM | POA: Diagnosis present

## 2020-03-08 DIAGNOSIS — M1711 Unilateral primary osteoarthritis, right knee: Secondary | ICD-10-CM

## 2020-03-08 DIAGNOSIS — Z7982 Long term (current) use of aspirin: Secondary | ICD-10-CM

## 2020-03-08 DIAGNOSIS — K219 Gastro-esophageal reflux disease without esophagitis: Secondary | ICD-10-CM | POA: Diagnosis present

## 2020-03-08 DIAGNOSIS — Z833 Family history of diabetes mellitus: Secondary | ICD-10-CM

## 2020-03-08 DIAGNOSIS — Z801 Family history of malignant neoplasm of trachea, bronchus and lung: Secondary | ICD-10-CM

## 2020-03-08 DIAGNOSIS — Z87891 Personal history of nicotine dependence: Secondary | ICD-10-CM

## 2020-03-08 DIAGNOSIS — E669 Obesity, unspecified: Secondary | ICD-10-CM | POA: Diagnosis present

## 2020-03-08 DIAGNOSIS — E782 Mixed hyperlipidemia: Secondary | ICD-10-CM | POA: Diagnosis present

## 2020-03-08 DIAGNOSIS — Z20822 Contact with and (suspected) exposure to covid-19: Secondary | ICD-10-CM | POA: Diagnosis present

## 2020-03-08 HISTORY — PX: TOTAL KNEE ARTHROPLASTY: SHX125

## 2020-03-08 LAB — TYPE AND SCREEN
ABO/RH(D): A POS
Antibody Screen: NEGATIVE

## 2020-03-08 LAB — GLUCOSE, CAPILLARY
Glucose-Capillary: 115 mg/dL — ABNORMAL HIGH (ref 70–99)
Glucose-Capillary: 137 mg/dL — ABNORMAL HIGH (ref 70–99)
Glucose-Capillary: 295 mg/dL — ABNORMAL HIGH (ref 70–99)
Glucose-Capillary: 326 mg/dL — ABNORMAL HIGH (ref 70–99)

## 2020-03-08 LAB — HEMOGLOBIN A1C
Hgb A1c MFr Bld: 6.4 % — ABNORMAL HIGH (ref 4.8–5.6)
Mean Plasma Glucose: 136.98 mg/dL

## 2020-03-08 SURGERY — ARTHROPLASTY, KNEE, TOTAL
Anesthesia: Spinal | Site: Knee | Laterality: Right

## 2020-03-08 MED ORDER — ONDANSETRON HCL 4 MG/2ML IJ SOLN
INTRAMUSCULAR | Status: AC
  Filled 2020-03-08: qty 2

## 2020-03-08 MED ORDER — BUPIVACAINE-EPINEPHRINE (PF) 0.5% -1:200000 IJ SOLN
INTRAMUSCULAR | Status: AC
  Filled 2020-03-08: qty 30

## 2020-03-08 MED ORDER — TRANEXAMIC ACID-NACL 1000-0.7 MG/100ML-% IV SOLN: 1000.0000 mg | INTRAVENOUS | Status: DC

## 2020-03-08 MED ORDER — PROPOFOL 500 MG/50ML IV EMUL
INTRAVENOUS | Status: DC | PRN
  Administered 2020-03-08: 75 ug/kg/min via INTRAVENOUS

## 2020-03-08 MED ORDER — CHLORHEXIDINE GLUCONATE 4 % EX LIQD: 60.0000 mL | Freq: Once | CUTANEOUS | Status: DC

## 2020-03-08 MED ORDER — PROPOFOL 10 MG/ML IV BOLUS
INTRAVENOUS | Status: DC | PRN
  Administered 2020-03-08: 20 mg via INTRAVENOUS

## 2020-03-08 MED ORDER — ACETAMINOPHEN 500 MG PO TABS
1000.0000 mg | ORAL_TABLET | Freq: Four times a day (QID) | ORAL | Status: AC
  Administered 2020-03-08 – 2020-03-09 (×4): 1000 mg via ORAL
  Filled 2020-03-08 (×4): qty 2

## 2020-03-08 MED ORDER — SODIUM CHLORIDE 0.9 % IR SOLN
Status: DC | PRN
  Administered 2020-03-08: 1000 mL

## 2020-03-08 MED ORDER — FENTANYL CITRATE (PF) 100 MCG/2ML IJ SOLN
INTRAMUSCULAR | Status: AC
  Filled 2020-03-08: qty 2

## 2020-03-08 MED ORDER — METHOCARBAMOL 500 MG PO TABS
500.0000 mg | ORAL_TABLET | Freq: Four times a day (QID) | ORAL | Status: DC | PRN
  Administered 2020-03-08 – 2020-03-10 (×4): 500 mg via ORAL
  Filled 2020-03-08 (×4): qty 1

## 2020-03-08 MED ORDER — ACETAMINOPHEN 500 MG PO TABS
1000.0000 mg | ORAL_TABLET | Freq: Once | ORAL | Status: AC
  Filled 2020-03-08: qty 2

## 2020-03-08 MED ORDER — INSULIN ASPART 100 UNIT/ML ~~LOC~~ SOLN
0.0000 [IU] | Freq: Three times a day (TID) | SUBCUTANEOUS | Status: DC
  Administered 2020-03-08: 8 [IU] via SUBCUTANEOUS
  Administered 2020-03-09: 10:00:00 2 [IU] via SUBCUTANEOUS
  Administered 2020-03-09: 5 [IU] via SUBCUTANEOUS
  Administered 2020-03-10: 18:00:00 2 [IU] via SUBCUTANEOUS
  Administered 2020-03-10 – 2020-03-11 (×3): 3 [IU] via SUBCUTANEOUS

## 2020-03-08 MED ORDER — ACETAMINOPHEN 500 MG PO TABS
ORAL_TABLET | ORAL | Status: AC
  Administered 2020-03-08: 1000 mg via ORAL
  Filled 2020-03-08: qty 2

## 2020-03-08 MED ORDER — CELECOXIB 200 MG PO CAPS
200.0000 mg | ORAL_CAPSULE | Freq: Once | ORAL | Status: AC
  Administered 2020-03-08: 200 mg via ORAL
  Filled 2020-03-08: qty 1

## 2020-03-08 MED ORDER — ONDANSETRON HCL 4 MG/2ML IJ SOLN: 4.0000 mg | Freq: Four times a day (QID) | INTRAMUSCULAR | Status: DC | PRN

## 2020-03-08 MED ORDER — BISACODYL 10 MG RE SUPP: 10.0000 mg | Freq: Every day | RECTAL | Status: DC | PRN

## 2020-03-08 MED ORDER — INSULIN ASPART 100 UNIT/ML ~~LOC~~ SOLN
0.0000 [IU] | Freq: Every day | SUBCUTANEOUS | Status: DC
  Administered 2020-03-08: 22:00:00 4 [IU] via SUBCUTANEOUS

## 2020-03-08 MED ORDER — PROPOFOL 10 MG/ML IV BOLUS
INTRAVENOUS | Status: AC
  Filled 2020-03-08: qty 20

## 2020-03-08 MED ORDER — FENTANYL CITRATE (PF) 100 MCG/2ML IJ SOLN
25.0000 ug | INTRAMUSCULAR | Status: DC | PRN
  Administered 2020-03-08: 10:00:00 25 ug via INTRAVENOUS

## 2020-03-08 MED ORDER — SODIUM CHLORIDE 0.9 % IV SOLN
75.0000 mL/h | INTRAVENOUS | Status: DC
  Administered 2020-03-08 – 2020-03-09 (×2): 75 mL/h via INTRAVENOUS

## 2020-03-08 MED ORDER — SODIUM CHLORIDE 0.9 % IV SOLN: INTRAVENOUS | Status: DC

## 2020-03-08 MED ORDER — MAGNESIUM CITRATE PO SOLN: 1.0000 | Freq: Once | ORAL | Status: DC | PRN

## 2020-03-08 MED ORDER — CARBIDOPA-LEVODOPA 25-100 MG PO TABS
1.0000 | ORAL_TABLET | Freq: Every day | ORAL | Status: DC
  Administered 2020-03-08 – 2020-03-11 (×15): 1 via ORAL
  Filled 2020-03-08 (×15): qty 1

## 2020-03-08 MED ORDER — CEFAZOLIN SODIUM-DEXTROSE 2-4 GM/100ML-% IV SOLN
2.0000 g | Freq: Four times a day (QID) | INTRAVENOUS | Status: AC
  Administered 2020-03-08 (×2): 2 g via INTRAVENOUS
  Filled 2020-03-08 (×2): qty 100

## 2020-03-08 MED ORDER — ONDANSETRON HCL 4 MG/2ML IJ SOLN
INTRAMUSCULAR | Status: DC | PRN
  Administered 2020-03-08: 4 mg via INTRAVENOUS

## 2020-03-08 MED ORDER — BUPIVACAINE-EPINEPHRINE 0.5% -1:200000 IJ SOLN
INTRAMUSCULAR | Status: DC | PRN
  Administered 2020-03-08: 30 mL

## 2020-03-08 MED ORDER — METOCLOPRAMIDE HCL 5 MG/ML IJ SOLN: 5.0000 mg | Freq: Three times a day (TID) | INTRAMUSCULAR | Status: DC | PRN

## 2020-03-08 MED ORDER — MAGNESIUM HYDROXIDE 400 MG/5ML PO SUSP: 30.0000 mL | Freq: Every day | ORAL | Status: DC | PRN

## 2020-03-08 MED ORDER — MIDAZOLAM HCL 2 MG/2ML IJ SOLN
INTRAMUSCULAR | Status: AC
  Filled 2020-03-08: qty 2

## 2020-03-08 MED ORDER — LIDOCAINE 2% (20 MG/ML) 5 ML SYRINGE
INTRAMUSCULAR | Status: AC
  Filled 2020-03-08: qty 5

## 2020-03-08 MED ORDER — FENTANYL CITRATE (PF) 100 MCG/2ML IJ SOLN
INTRAMUSCULAR | Status: DC | PRN
  Administered 2020-03-08 (×2): 50 ug via INTRAVENOUS

## 2020-03-08 MED ORDER — MIDAZOLAM HCL 5 MG/5ML IJ SOLN
INTRAMUSCULAR | Status: DC | PRN
  Administered 2020-03-08 (×2): 1 mg via INTRAVENOUS

## 2020-03-08 MED ORDER — HYDROMORPHONE HCL 1 MG/ML IJ SOLN: 0.5000 mg | INTRAMUSCULAR | Status: DC | PRN

## 2020-03-08 MED ORDER — PROPOFOL 500 MG/50ML IV EMUL
INTRAVENOUS | Status: AC
  Filled 2020-03-08: qty 50

## 2020-03-08 MED ORDER — PROMETHAZINE HCL 25 MG/ML IJ SOLN: 6.2500 mg | INTRAMUSCULAR | Status: DC | PRN

## 2020-03-08 MED ORDER — TRANEXAMIC ACID-NACL 1000-0.7 MG/100ML-% IV SOLN
INTRAVENOUS | Status: AC
  Filled 2020-03-08: qty 100

## 2020-03-08 MED ORDER — PRAMIPEXOLE DIHYDROCHLORIDE 0.25 MG PO TABS
0.5000 mg | ORAL_TABLET | Freq: Three times a day (TID) | ORAL | Status: DC
  Administered 2020-03-08 – 2020-03-11 (×8): 0.5 mg via ORAL
  Filled 2020-03-08 (×8): qty 2

## 2020-03-08 MED ORDER — ROPIVACAINE HCL 7.5 MG/ML IJ SOLN
INTRAMUSCULAR | Status: DC | PRN
  Administered 2020-03-08: 20 mL via PERINEURAL

## 2020-03-08 MED ORDER — METOCLOPRAMIDE HCL 5 MG PO TABS: 5.0000 mg | ORAL_TABLET | Freq: Three times a day (TID) | ORAL | Status: DC | PRN

## 2020-03-08 MED ORDER — LACTATED RINGERS IV SOLN: INTRAVENOUS | Status: DC

## 2020-03-08 MED ORDER — DOCUSATE SODIUM 100 MG PO CAPS
100.0000 mg | ORAL_CAPSULE | Freq: Two times a day (BID) | ORAL | Status: DC
  Administered 2020-03-08 – 2020-03-11 (×6): 100 mg via ORAL
  Filled 2020-03-08 (×6): qty 1

## 2020-03-08 MED ORDER — KETOROLAC TROMETHAMINE 15 MG/ML IJ SOLN
7.5000 mg | Freq: Four times a day (QID) | INTRAMUSCULAR | Status: AC
  Administered 2020-03-08 – 2020-03-09 (×4): 7.5 mg via INTRAVENOUS
  Filled 2020-03-08 (×4): qty 1

## 2020-03-08 MED ORDER — ONDANSETRON HCL 4 MG PO TABS: 4.0000 mg | ORAL_TABLET | Freq: Four times a day (QID) | ORAL | Status: DC | PRN

## 2020-03-08 MED ORDER — METHOCARBAMOL 500 MG IVPB - SIMPLE MED
INTRAVENOUS | Status: AC
  Filled 2020-03-08: qty 50

## 2020-03-08 MED ORDER — CEFAZOLIN SODIUM-DEXTROSE 2-4 GM/100ML-% IV SOLN
2.0000 g | INTRAVENOUS | Status: AC
  Administered 2020-03-08: 2 g via INTRAVENOUS
  Filled 2020-03-08: qty 100

## 2020-03-08 MED ORDER — POVIDONE-IODINE 10 % EX SWAB
2.0000 "application " | Freq: Once | CUTANEOUS | Status: AC
  Administered 2020-03-08: 2 via TOPICAL

## 2020-03-08 MED ORDER — ALUM & MAG HYDROXIDE-SIMETH 200-200-20 MG/5ML PO SUSP: 30.0000 mL | ORAL | Status: DC | PRN

## 2020-03-08 MED ORDER — METHOCARBAMOL 500 MG IVPB - SIMPLE MED
500.0000 mg | Freq: Four times a day (QID) | INTRAVENOUS | Status: DC | PRN
  Administered 2020-03-08: 10:00:00 500 mg via INTRAVENOUS
  Filled 2020-03-08: qty 50

## 2020-03-08 MED ORDER — LABETALOL HCL 5 MG/ML IV SOLN
5.0000 mg | INTRAVENOUS | Status: AC | PRN
  Administered 2020-03-08 (×4): 5 mg via INTRAVENOUS

## 2020-03-08 MED ORDER — MENTHOL 3 MG MT LOZG: 1.0000 | LOZENGE | OROMUCOSAL | Status: DC | PRN

## 2020-03-08 MED ORDER — OXYCODONE HCL 5 MG PO TABS
5.0000 mg | ORAL_TABLET | ORAL | Status: DC | PRN
  Administered 2020-03-09: 10 mg via ORAL
  Administered 2020-03-09: 5 mg via ORAL
  Administered 2020-03-09: 10 mg via ORAL
  Filled 2020-03-08: qty 2
  Filled 2020-03-08: qty 1
  Filled 2020-03-08 (×2): qty 2

## 2020-03-08 MED ORDER — DIPHENHYDRAMINE HCL 12.5 MG/5ML PO ELIX: 12.5000 mg | ORAL_SOLUTION | ORAL | Status: DC | PRN

## 2020-03-08 MED ORDER — LABETALOL HCL 5 MG/ML IV SOLN
INTRAVENOUS | Status: AC
  Filled 2020-03-08: qty 4

## 2020-03-08 MED ORDER — TRANEXAMIC ACID-NACL 1000-0.7 MG/100ML-% IV SOLN
1000.0000 mg | INTRAVENOUS | Status: AC
  Administered 2020-03-08: 1000 mg via INTRAVENOUS

## 2020-03-08 MED ORDER — PHENOL 1.4 % MT LIQD: 1.0000 | OROMUCOSAL | Status: DC | PRN

## 2020-03-08 MED ORDER — BUPIVACAINE IN DEXTROSE 0.75-8.25 % IT SOLN
INTRATHECAL | Status: DC | PRN
  Administered 2020-03-08: 1.6 mL via INTRATHECAL

## 2020-03-08 MED ORDER — DEXAMETHASONE SODIUM PHOSPHATE 10 MG/ML IJ SOLN
INTRAMUSCULAR | Status: DC | PRN
  Administered 2020-03-08: 10 mg via INTRAVENOUS

## 2020-03-08 MED ORDER — 0.9 % SODIUM CHLORIDE (POUR BTL) OPTIME
TOPICAL | Status: DC | PRN
  Administered 2020-03-08: 08:00:00 1000 mL

## 2020-03-08 MED ORDER — ASPIRIN 81 MG PO CHEW
81.0000 mg | CHEWABLE_TABLET | Freq: Two times a day (BID) | ORAL | Status: DC
  Administered 2020-03-08 – 2020-03-11 (×6): 81 mg via ORAL
  Filled 2020-03-08 (×6): qty 1

## 2020-03-08 MED ORDER — DEXAMETHASONE SODIUM PHOSPHATE 10 MG/ML IJ SOLN
INTRAMUSCULAR | Status: AC
  Filled 2020-03-08: qty 1

## 2020-03-08 SURGICAL SUPPLY — 56 items
BAG DECANTER FOR FLEXI CONT (MISCELLANEOUS) ×1 IMPLANT
BAG SPEC THK2 15X12 ZIP CLS (MISCELLANEOUS) ×1
BAG ZIPLOCK 12X15 (MISCELLANEOUS) ×3 IMPLANT
BLADE SAGITTAL 25.0X1.19X90 (BLADE) ×2 IMPLANT
BLADE SAGITTAL 25.0X1.19X90MM (BLADE) ×1
BOWL SMART MIX CTS (DISPOSABLE) ×3 IMPLANT
CEMENT HV SMART SET (Cement) ×6 IMPLANT
CEMENT TIBIA MBT SIZE 4 (Knees) IMPLANT
COMP FEM CEM STD+ RT LCS (Orthopedic Implant) ×3 IMPLANT
COMP PATELLA PEGX3 CEM STAN+ (Knees) ×3 IMPLANT
COMPONENT FEM CEM STD+ RT LCS (Orthopedic Implant) IMPLANT
COMPONENT PTLA PEGX3 CEM STAN+ (Knees) IMPLANT
COVER SURGICAL LIGHT HANDLE (MISCELLANEOUS) ×3 IMPLANT
COVER WAND RF STERILE (DRAPES) IMPLANT
CUFF TOURN SGL QUICK 34 (TOURNIQUET CUFF) ×3
CUFF TRNQT CYL 34X4.125X (TOURNIQUET CUFF) ×1 IMPLANT
DECANTER SPIKE VIAL GLASS SM (MISCELLANEOUS) ×3 IMPLANT
DRAPE IMP U-DRAPE 54X76 (DRAPES) ×3 IMPLANT
DRAPE SHEET LG 3/4 BI-LAMINATE (DRAPES) ×6 IMPLANT
DRSG ADAPTIC 3X8 NADH LF (GAUZE/BANDAGES/DRESSINGS) ×3 IMPLANT
DRSG PAD ABDOMINAL 8X10 ST (GAUZE/BANDAGES/DRESSINGS) ×3 IMPLANT
DURAPREP 26ML APPLICATOR (WOUND CARE) ×6 IMPLANT
ELECT REM PT RETURN 15FT ADLT (MISCELLANEOUS) ×3 IMPLANT
GAUZE SPONGE 4X4 12PLY STRL (GAUZE/BANDAGES/DRESSINGS) ×3 IMPLANT
GLOVE BIOGEL PI IND STRL 8 (GLOVE) ×1 IMPLANT
GLOVE BIOGEL PI IND STRL 8.5 (GLOVE) ×1 IMPLANT
GLOVE BIOGEL PI INDICATOR 8 (GLOVE) ×2
GLOVE BIOGEL PI INDICATOR 8.5 (GLOVE) ×2
GLOVE ECLIPSE 8.0 STRL XLNG CF (GLOVE) ×6 IMPLANT
GLOVE ECLIPSE 8.5 STRL (GLOVE) ×6 IMPLANT
GOWN STRL REUS W/ TWL LRG LVL3 (GOWN DISPOSABLE) ×1 IMPLANT
GOWN STRL REUS W/TWL 2XL LVL3 (GOWN DISPOSABLE) ×3 IMPLANT
GOWN STRL REUS W/TWL LRG LVL3 (GOWN DISPOSABLE) ×3
HANDPIECE INTERPULSE COAX TIP (DISPOSABLE) ×3
HOLDER FOLEY CATH W/STRAP (MISCELLANEOUS) ×2 IMPLANT
INSERT TIB LCS RP STD+ 10 (Knees) ×2 IMPLANT
KIT TURNOVER KIT A (KITS) IMPLANT
MANIFOLD NEPTUNE II (INSTRUMENTS) ×3 IMPLANT
NS IRRIG 1000ML POUR BTL (IV SOLUTION) ×3 IMPLANT
PACK TOTAL KNEE CUSTOM (KITS) ×3 IMPLANT
PADDING CAST COTTON 6X4 STRL (CAST SUPPLIES) ×6 IMPLANT
PENCIL SMOKE EVACUATOR (MISCELLANEOUS) ×2 IMPLANT
PIN STEINMAN FIXATION KNEE (PIN) ×2 IMPLANT
PROTECTOR NERVE ULNAR (MISCELLANEOUS) ×3 IMPLANT
SET HNDPC FAN SPRY TIP SCT (DISPOSABLE) ×1 IMPLANT
STAPLER VISISTAT 35W (STAPLE) ×5 IMPLANT
SUT BONE WAX W31G (SUTURE) ×3 IMPLANT
SUT ETHIBOND NAB CT1 #1 30IN (SUTURE) ×6 IMPLANT
SUT MNCRL AB 3-0 PS2 18 (SUTURE) ×3 IMPLANT
SUT VIC AB 2-0 PS2 27 (SUTURE) ×3 IMPLANT
TIBIA MBT CEMENT SIZE 4 (Knees) ×3 IMPLANT
TRAY FOLEY MTR SLVR 16FR STAT (SET/KITS/TRAYS/PACK) ×3 IMPLANT
UNDERPAD 30X36 HEAVY ABSORB (UNDERPADS AND DIAPERS) ×3 IMPLANT
WATER STERILE IRR 1000ML POUR (IV SOLUTION) ×6 IMPLANT
WRAP KNEE MAXI GEL POST OP (GAUZE/BANDAGES/DRESSINGS) ×3 IMPLANT
YANKAUER SUCT BULB TIP 10FT TU (MISCELLANEOUS) ×3 IMPLANT

## 2020-03-08 NOTE — Anesthesia Procedure Notes (Signed)
Anesthesia Regional Block: Adductor canal block   Pre-Anesthetic Checklist: ,, timeout performed, Correct Patient, Correct Site, Correct Laterality, Correct Procedure, Correct Position, site marked, Risks and benefits discussed,  Surgical consent,  Pre-op evaluation,  At surgeon's request and post-op pain management  Laterality: Right  Prep: chloraprep       Needles:  Injection technique: Single-shot  Needle Type: Stimulator Needle - 80     Needle Length: 10cm  Needle Gauge: 21     Additional Needles:   Narrative:  Start time: 03/08/2020 6:32 AM End time: 03/08/2020 6:42 AM Injection made incrementally with aspirations every 5 mL.  Performed by: Personally

## 2020-03-08 NOTE — Anesthesia Procedure Notes (Signed)
Date/Time: 03/08/2020 7:24 AM Performed by: Sharlette Dense, CRNA Oxygen Delivery Method: Simple face mask

## 2020-03-08 NOTE — Progress Notes (Signed)
Rehab Admissions Coordinator Note:  Per PT recommendation, this patient was screened by Raechel Ache for appropriateness for an Inpatient Acute Rehab Consult.  Noted pt has history of Parkinsons which may justify an IP Rehab admission. Please have MD place consult order if you would like for this patient to be further assessed for possible CI placement.   Raechel Ache 03/08/2020, 4:46 PM  I can be reached at [phone].

## 2020-03-08 NOTE — H&P (Signed)
The recent History & Physical has been reviewed. I have personally examined the patient today. There is no interval change to the documented History & Physical. The patient would like to proceed with the procedure.  Garald Balding 03/08/2020,  7:13 AM

## 2020-03-08 NOTE — Op Note (Signed)
PATIENT ID:      Danny [name]  MRN:     [id number] DOB/AGE:    [id number] / 69 y.o.       OPERATIVE REPORT    DATE OF PROCEDURE:  03/08/2020       PREOPERATIVE DIAGNOSIS:end stage   right knee osteoarthritis                                                       Estimated body mass index is 32.3 kg/m as calculated from the following:   Height as of this encounter: 5\' 10"  (1.778 m).   Weight as of this encounter: 102.1 kg.     POSTOPERATIVE DIAGNOSIS: end stage  right knee osteoarthritis                                                                     Estimated body mass index is 32.3 kg/m as calculated from the following:   Height as of this encounter: 5\' 10"  (1.778 m).   Weight as of this encounter: 102.1 kg.     PROCEDURE:  Procedure(s): RIGHT TOTAL KNEE ARTHROPLASTY     SURGEON:  Joni Fears, MD    ASSISTANT:   Biagio Borg, PA-C   (Present and scrubbed throughout the case, critical for assistance with exposure, retraction, instrumentation, and closure.)          ANESTHESIA: regional, spinal and IV sedation     DRAINS: none :      TOURNIQUET TIME:  Total Tourniquet Time Documented: Thigh (Right) - 63 minutes Total: Thigh (Right) - 63 minutes     COMPLICATIONS:  None   CONDITION:  stable  PROCEDURE IN DETAIL:    Garald Balding 03/08/2020, 9:20 AM

## 2020-03-08 NOTE — Evaluation (Signed)
Physical Therapy Evaluation Patient Details Name: Danny [name] MRN: [id number] DOB: 1951/08/22 Today's Date: 03/08/2020   History of Present Illness  Patient is 69 y.o. male s/p Rt TKA on 03/08/20 with PMH significant for DM, Parkinsons, OA, GERD, HTN, HLD.  Clinical Impression  Danny [name] is a 69 y.o. male POD 0 s/p Rt TKA. Patient reports modified independence with gait using SPC/RW and occasional assistance required for standing up from lower surfaces. Pt's wife reports she helps when needed with usually min guard/assist. Patient is now limited by functional impairments (see PT problem list below) and requires mod-max for bed mobility and transfers with RW. He was limited by poor Rt quad activation and required knee immobilizer for safety this session. Patient instructed in exercise to facilitate ROM and circulation. Patient is highly motivated to return to [facility] and be able to ambulate with AD independently in home. He will benefit from continued skilled PT interventions to address impairments. Recommending intensive skilled PT for follow up in CIR setting to progress towards PLOF and reduce fall risk in preparation for safe return home. Acute PT will follow to progress mobility as able.     Follow Up Recommendations CIR    Equipment Recommendations  None recommended by PT    Recommendations for Other Services OT consult     Precautions / Restrictions Precautions Precautions: Fall Restrictions Weight Bearing Restrictions: No      Mobility  Bed Mobility Overal bed mobility: Needs Assistance Bed Mobility: Supine to Sit     Supine to sit: Mod assist;HOB elevated     General bed mobility comments: cues for use of bed rail and assist required to reach Rt UE to rail. Pt initiating LE movement to EOB but required assist to bring BIL LE's off EOB fully. Assist to raise trunk upright and scoot forward.  Transfers Overall transfer level: Needs assistance Equipment used:  Rolling walker (2 wheeled) Transfers: Sit to/from [facility] Sit to Stand: Mod assist;+2 physical assistance;+2 safety/equipment;From elevated surface;Max assist Stand pivot transfers: Mod assist;+2 physical assistance;+2 safety/equipment;From elevated surface       General transfer comment: Pt required cues for hand placement and technique with RW. Mod assist +2 to initiate powerup and steady with rising required. Mod-Max assist required for external support to prevent LOB with stand step/pivot. Pt required blocking at Rt knee in addition to knee immobilizer to facilitate knee extension further in stance phase.  Ambulation/Gait        General Gait Details: defferred due to safety concerns secondary to poor Rt quad activation.  Stairs     Wheelchair Mobility    Modified Rankin (Stroke Patients Only)       Balance Overall balance assessment: Needs assistance;History of Falls Sitting-balance support: Bilateral upper extremity supported;Feet supported Sitting balance-Leahy Scale: Fair     Standing balance support: During functional activity;Bilateral upper extremity supported Standing balance-Leahy Scale: Poor          Pertinent Vitals/Pain Pain Assessment: Faces Faces Pain Scale: Hurts little more Pain Location: Rt knee Pain Descriptors / Indicators: Aching;Discomfort Pain Intervention(s): Limited activity within patient's tolerance;Monitored during session;Repositioned;Ice applied    Home Living Family/patient expects to be discharged to:: Private residence Living Arrangements: Spouse/significant other;Children;Other relatives Available Help at Discharge: Family Type of Home: House Home Access: Stairs to enter;Ramped entrance Entrance Stairs-Rails: Right Entrance Stairs-Number of Steps: 4-5 Home Layout: One level Home Equipment: Clinical cytogeneticist - 2 wheels;Walker - 4 wheels;Cane - single point;Bedside commode;Grab bars - tub/shower;Hand  held  shower head;Wheelchair - manual Additional Comments: pt typically enters through stairs but has ramp available if needed    Prior Function Level of Independence: Independent with assistive device(s);Needs assistance   Gait / Transfers Assistance Needed: pt has been primarily using [employer]itez for mobility and RW intermittently due to knee pain. Pt requries assist for sit<>stand occasionally from spouse when he is more unsteady.  ADL's / Homemaking Assistance Needed: pt having increased trouble with dressing recently, especialyl struggling with buttons, pt typically sits to don lower body clothes and his wife has needed to help him more recently.        Hand Dominance   Dominant Hand: Right    Extremity/Trunk Assessment   Upper Extremity Assessment Upper Extremity Assessment: Generalized weakness(pt with bil UE tremors)    Lower Extremity Assessment Lower Extremity Assessment: Generalized weakness;RLE deficits/detail RLE Deficits / Details: pt with poor quad activation and knee immobilizer required for safety RLE: Unable to fully assess due to immobilization RLE Coordination: decreased gross motor    Cervical / Trunk Assessment Cervical / Trunk Assessment: Kyphotic  Communication   Communication: No difficulties  Cognition Arousal/Alertness: Awake/alert Behavior During Therapy: WFL for tasks assessed/performed Overall Cognitive Status: Within Functional Limits for tasks assessed         General Comments General comments (skin integrity, edema, etc.): pt has history of falls, reports 2 in last 6 months while ambulting. reports SPC gets caughter occasionally.    Exercises Total Joint Exercises Ankle Circles/Pumps: AROM;Both;10 reps;Seated Quad Sets: AROM;Right;Supine;10 reps(pt had difficulty activating quad, tactile cues required)   Assessment/Plan    PT Assessment Patient needs continued PT services  PT Problem List Decreased range of motion;Decreased strength;Decreased  balance;Decreased activity tolerance;Decreased mobility;Decreased coordination;Decreased knowledge of use of DME;Decreased knowledge of precautions       PT Treatment Interventions DME instruction;Gait training;Stair training;Functional mobility training;Therapeutic activities;Therapeutic exercise;Balance training;Patient/family education    PT Goals (Current goals can be found in the Care Plan section)  Acute Rehab PT Goals Patient Stated Goal: to get back to walking without assistance; also to be able to button clothing more easily PT Goal Formulation: With patient/family Time For Goal Achievement: 03/15/20 Potential to Achieve Goals: Good    Frequency 7X/week    AM-PAC PT "6 Clicks" Mobility  Outcome Measure Help needed turning from your back to your side while in a flat bed without using bedrails?: A Little Help needed moving from lying on your back to sitting on the side of a flat bed without using bedrails?: A Lot Help needed moving to and from a bed to a chair (including a wheelchair)?: A Lot Help needed standing up from a chair using your arms (e.g., wheelchair or bedside chair)?: A Lot Help needed to walk in hospital room?: A Lot Help needed climbing 3-5 steps with a railing? : Total 6 Click Score: 12    End of Session Equipment Utilized During Treatment: Gait belt;Right knee immobilizer Activity Tolerance: Patient tolerated treatment well Patient left: in chair;with call bell/phone within reach;with chair alarm set;with family/visitor present Nurse Communication: Mobility status PT Visit Diagnosis: Muscle weakness (generalized) (M62.81);Other abnormalities of gait and mobility (R26.89);History of falling (Z91.81);Difficulty in walking, not elsewhere classified (R26.2)    Time: [id number] PT Time Calculation (min) (ACUTE ONLY): 30 min   Charges:   PT Evaluation $PT Eval Moderate Complexity: 1 Mod PT Treatments $Therapeutic Activity: 8-22 mins      Verner Mould,  DPT Physical Therapist with [facility] [phone]  03/08/2020 3:36 PM

## 2020-03-08 NOTE — Anesthesia Procedure Notes (Signed)
Spinal  Patient location during procedure: OR Start time: 03/08/2020 7:17 AM End time: 03/08/2020 7:27 AM Staffing Performed: anesthesiologist  Anesthesiologist: Duane Boston, MD Preanesthetic Checklist Completed: patient identified, IV checked, risks and benefits discussed, surgical consent, monitors and equipment checked, pre-op evaluation and timeout performed Spinal Block Patient position: sitting Prep: DuraPrep Patient monitoring: cardiac monitor, continuous pulse ox and blood pressure Approach: midline Location: L2-3 Injection technique: single-shot Needle Needle type: Pencan  Needle gauge: 24 G Needle length: 9 cm Additional Notes Functioning IV was confirmed and monitors were applied. Sterile prep and drape, including hand hygiene and sterile gloves were used. The patient was positioned and the spine was prepped. The skin was anesthetized with lidocaine.  Free flow of clear CSF was obtained prior to injecting local anesthetic into the CSF.  The spinal needle aspirated freely following injection.  The needle was carefully withdrawn.  The patient tolerated the procedure well.

## 2020-03-08 NOTE — Anesthesia Postprocedure Evaluation (Signed)
Anesthesia Post Note  Patient: Danny [name]  Procedure(s) Performed: RIGHT TOTAL KNEE ARTHROPLASTY (Right Knee)     Patient location during evaluation: PACU Anesthesia Type: Spinal Level of consciousness: awake and alert Pain management: pain level controlled Vital Signs Assessment: post-procedure vital signs reviewed and stable Respiratory status: spontaneous breathing and respiratory function stable Cardiovascular status: blood pressure returned to baseline and stable Postop Assessment: spinal receding Anesthetic complications: no    Last Vitals:  Vitals:   03/08/20 1110 03/08/20 1115  BP: (!) 170/100 (!) 179/103  Pulse: 85 88  Resp: 12 13  Temp:    SpO2: 97% 97%    Last Pain:  Vitals:   03/08/20 1100  TempSrc:   PainSc: 0-No pain                 Brenda Cowher DANIEL

## 2020-03-08 NOTE — Op Note (Signed)
NAME: JERRICK, FARVE MEDICAL RECORD [id number] ACCOUNT [id number] DATE OF BIRTH:Aug 31, 1951 FACILITY: [facility] LOCATION: [facility] PHYSICIAN:Annaclaire Walsworth Sharlotte Alamo, MD  OPERATIVE REPORT  DATE OF PROCEDURE:  03/08/2020  PREOPERATIVE DIAGNOSIS:  End-stage osteoarthritis, right knee.  POSTOPERATIVE DIAGNOSIS:  End-stage osteoarthritis, right knee.  PROCEDURE:  Right total knee replacement.  SURGEON:  Joni Fears, MD  ASSISTANT:  Biagio Borg, PA-C  ANESTHESIA:  Spinal with adductor canal block and IV sedation.  COMPLICATIONS:  None.  COMPONENTS:  DePuy LCS standard plus femoral component, a #4 rotating keeled tibial tray with a 10 mm polyethylene bridging bearing.  A metal backed 3 peg patella.  Components were secured with polymethyl methacrylate.  DESCRIPTION OF PROCEDURE:  The patient was met in the holding area and identified the right knee as the appropriate operative site and marked it accordingly.  Anesthesia performed an adductor canal block.  Any questions were answered.  I marked the right  knee as the appropriate operative site.  The patient was then transported to room #4.  A spinal anesthetic was provided by anesthesia without difficulty.  Nursing staff inserted a Foley catheter.  Urine was clear.    With IV sedation tourniquet was then applied to the right leg.  Right lower extremity was prepped with chlorhexidine scrub and DuraPrep from the tourniquet to the tips of the toes.  Sterile draping was performed.  A timeout was called.  The extremity was then elevated, was Esmarch exsanguinated with a proximal tourniquet at 325 mmHg.  A midline longitudinal incision was made centered about the patella extending from the superior pouch to the tibial tubercle.  Via sharp dissection, the incision was carried down to subcutaneous tissue.  Gross bleeders were Bovie coagulated.  Deep  capsule was then incised with the Bovie.  The joint was entered.  There was a small clear  yellow joint effusion.  Patella was everted 180 degrees laterally, the knee flexed to 90 degrees.  There were moderately large osteophytes along the medial and  lateral femoral condyle, which were removed for sizing purposes.  There was a considerable loss of articular cartilage, particularly along the medial femoral condyle where there was a varus position that was not fixed.  I did size the standard plus femoral component.  Synovectomy was performed.  There was a moderate amount of beefy red synovitis.  No loose bodies.  First bony cut was then made transversely along the proximal tibia with a 7 degree angle of declination using the external guide.  After each bony cut on both the tibia and the femur, I checked my alignment with the external guide.  Subsequent cuts were then made on the femur using the standard plus guide.  Flexion and extension gaps were perfectly symmetrical at 10 mm.  I used a 4-degree distal femoral valgus cut and then the finishing guide for tapering purposes.  Laminar spreader  was then inserted along the medial and lateral compartment.  I removed medial and lateral menisci as well as ACL and PCL.  3/4 inch curved osteotome was used to remove any osteophytes from the posterior femoral condyle.  I again checked my sizing at 10  mm and they were perfectly symmetrical in both flexion and extension.  Retractors were then placed about the tibia.  I measured a #4 tibial tray.  This was pinned in place.  The center hole was then made followed by the keeled cut.  With the tibial jig in place, a 10 mm polyethylene bridging bearing was applied followed  by  the trial standard plus femoral component.  The entire construct was reduced and through a full range of motion remained perfectly stable.  Patella was prepared by removing approximately 10.5 mm of bone, leaving just about 12.5-13 mm of thickness.  Three holes were then made.  Trial patella inserted and through a full range of motion  remained perfectly stable.  Trial components were then removed.  The joint was copiously irrigated with saline solution.  The final components were then impacted with polymethyl methacrylate.  We initially applied the tibial component followed by the 10 mm polyethylene bridging bearing and then the femur.  The knee was reduced.  Extraneous methacrylate was removed from the  periphery of the components.  Patella was then applied with methacrylate and a patellar clamp.  At approximately 16 minutes the methacrylate had matured.  During this time, we irrigated the joint and injected 0.25% Marcaine with epinephrine to the deep capsule.  Tourniquet was  deflated at 63 minutes.  Any further bleeding was controlled with the Bovie.  Deep capsule was then closed with a running #1 Ethibond, superficial capsule with 0 Vicryl, subcutaneous with 3-0 Monocryl, skin closed with skin clips.  Sterile bulky dressing was applied followed by the patient's support stocking.  The patient tolerated the procedure without any complications.  CN/NUANCE  D:03/08/2020 T:03/08/2020 JOB:[id number]

## 2020-03-08 NOTE — Transfer of Care (Signed)
Immediate Anesthesia Transfer of Care Note  Patient: Danny [name]  Procedure(s) Performed: RIGHT TOTAL KNEE ARTHROPLASTY (Right Knee)  Patient Location: PACU  Anesthesia Type:Spinal  Level of Consciousness: drowsy  Airway & Oxygen Therapy: Patient Spontanous Breathing and Patient connected to face mask oxygen  Post-op Assessment: Report given to RN and Post -op Vital signs reviewed and stable  Post vital signs: Reviewed and stable  Last Vitals:  Vitals Value Taken Time  BP 166/98 03/08/20 0938  Temp    Pulse 95 03/08/20 0938  Resp 17 03/08/20 0938  SpO2 99 % 03/08/20 0938  Vitals shown include unvalidated device data.  Last Pain:  Vitals:   03/08/20 0606  TempSrc: Oral      Patients Stated Pain Goal: 4 ([id number])  Complications: No apparent anesthesia complications

## 2020-03-08 NOTE — Anesthesia Procedure Notes (Signed)
Date/Time: 03/08/2020 6:40 AM Performed by: Sharlette Dense, CRNA Oxygen Delivery Method: Nasal cannula

## 2020-03-09 ENCOUNTER — Encounter: Payer: Self-pay | Admitting: *Deleted

## 2020-03-09 DIAGNOSIS — I509 Heart failure, unspecified: Secondary | ICD-10-CM | POA: Diagnosis present

## 2020-03-09 DIAGNOSIS — Z885 Allergy status to narcotic agent status: Secondary | ICD-10-CM | POA: Diagnosis not present

## 2020-03-09 DIAGNOSIS — I110 Hypertensive heart disease with heart failure: Secondary | ICD-10-CM | POA: Diagnosis present

## 2020-03-09 DIAGNOSIS — Z6832 Body mass index (BMI) 32.0-32.9, adult: Secondary | ICD-10-CM | POA: Diagnosis not present

## 2020-03-09 DIAGNOSIS — Z8371 Family history of colonic polyps: Secondary | ICD-10-CM | POA: Diagnosis not present

## 2020-03-09 DIAGNOSIS — E669 Obesity, unspecified: Secondary | ICD-10-CM | POA: Diagnosis present

## 2020-03-09 DIAGNOSIS — E119 Type 2 diabetes mellitus without complications: Secondary | ICD-10-CM | POA: Diagnosis present

## 2020-03-09 DIAGNOSIS — M1711 Unilateral primary osteoarthritis, right knee: Secondary | ICD-10-CM | POA: Diagnosis present

## 2020-03-09 DIAGNOSIS — Z7984 Long term (current) use of oral hypoglycemic drugs: Secondary | ICD-10-CM | POA: Diagnosis not present

## 2020-03-09 DIAGNOSIS — E782 Mixed hyperlipidemia: Secondary | ICD-10-CM | POA: Diagnosis present

## 2020-03-09 DIAGNOSIS — Z87891 Personal history of nicotine dependence: Secondary | ICD-10-CM | POA: Diagnosis not present

## 2020-03-09 DIAGNOSIS — Z7982 Long term (current) use of aspirin: Secondary | ICD-10-CM | POA: Diagnosis not present

## 2020-03-09 DIAGNOSIS — Z833 Family history of diabetes mellitus: Secondary | ICD-10-CM | POA: Diagnosis not present

## 2020-03-09 DIAGNOSIS — Z20822 Contact with and (suspected) exposure to covid-19: Secondary | ICD-10-CM | POA: Diagnosis present

## 2020-03-09 DIAGNOSIS — Z8249 Family history of ischemic heart disease and other diseases of the circulatory system: Secondary | ICD-10-CM | POA: Diagnosis not present

## 2020-03-09 DIAGNOSIS — G20 Parkinson's disease: Secondary | ICD-10-CM | POA: Diagnosis present

## 2020-03-09 DIAGNOSIS — K219 Gastro-esophageal reflux disease without esophagitis: Secondary | ICD-10-CM | POA: Diagnosis present

## 2020-03-09 DIAGNOSIS — Z801 Family history of malignant neoplasm of trachea, bronchus and lung: Secondary | ICD-10-CM | POA: Diagnosis not present

## 2020-03-09 LAB — BASIC METABOLIC PANEL
Anion gap: 8 (ref 5–15)
BUN: 25 mg/dL — ABNORMAL HIGH (ref 8–23)
CO2: 21 mmol/L — ABNORMAL LOW (ref 22–32)
Calcium: 8.8 mg/dL — ABNORMAL LOW (ref 8.9–10.3)
Chloride: 108 mmol/L (ref 98–111)
Creatinine, Ser: 0.96 mg/dL (ref 0.61–1.24)
GFR calc Af Amer: >60 mL/min (ref >60)
GFR calc non Af Amer: >60 mL/min (ref >60)
Glucose, Bld: 162 mg/dL — ABNORMAL HIGH (ref 70–99)
Potassium: 4.2 mmol/L (ref 3.5–5.1)
Sodium: 137 mmol/L (ref 135–145)

## 2020-03-09 LAB — CBC
HCT: 36.9 % — ABNORMAL LOW (ref 39.0–52.0)
Hemoglobin: 11.7 g/dL — ABNORMAL LOW (ref 13.0–17.0)
MCH: 30.5 pg (ref 26.0–34.0)
MCHC: 31.7 g/dL (ref 30.0–36.0)
MCV: 96.1 fL (ref 80.0–100.0)
Platelets: 170 10*3/uL (ref 150–400)
RBC: 3.84 MIL/uL — ABNORMAL LOW (ref 4.22–5.81)
RDW: 14.2 % (ref 11.5–15.5)
WBC: 14.9 10*3/uL — ABNORMAL HIGH (ref 4.0–10.5)
nRBC: 0 % (ref 0.0–0.2)

## 2020-03-09 LAB — GLUCOSE, CAPILLARY
Glucose-Capillary: 131 mg/dL — ABNORMAL HIGH (ref 70–99)
Glucose-Capillary: 119 mg/dL — ABNORMAL HIGH (ref 70–99)
Glucose-Capillary: 210 mg/dL — ABNORMAL HIGH (ref 70–99)
Glucose-Capillary: 191 mg/dL — ABNORMAL HIGH (ref 70–99)

## 2020-03-09 MED ORDER — GLIPIZIDE ER 5 MG PO TB24
10.0000 mg | ORAL_TABLET | Freq: Every day | ORAL | Status: DC
  Administered 2020-03-09 – 2020-03-10 (×2): 10 mg via ORAL
  Filled 2020-03-09 (×2): qty 2

## 2020-03-09 MED ORDER — METFORMIN HCL 500 MG PO TABS
1000.0000 mg | ORAL_TABLET | Freq: Two times a day (BID) | ORAL | Status: DC
  Administered 2020-03-09 – 2020-03-11 (×4): 1000 mg via ORAL
  Filled 2020-03-09 (×4): qty 2

## 2020-03-09 MED ORDER — SIMVASTATIN 20 MG PO TABS
20.0000 mg | ORAL_TABLET | ORAL | Status: DC
  Administered 2020-03-09 – 2020-03-11 (×2): 20 mg via ORAL
  Filled 2020-03-09 (×2): qty 1

## 2020-03-09 MED ORDER — FAMOTIDINE 20 MG PO TABS
20.0000 mg | ORAL_TABLET | Freq: Two times a day (BID) | ORAL | Status: DC
  Administered 2020-03-09 – 2020-03-11 (×4): 20 mg via ORAL
  Filled 2020-03-09 (×4): qty 1

## 2020-03-09 MED ORDER — LINAGLIPTIN 5 MG PO TABS
5.0000 mg | ORAL_TABLET | Freq: Every day | ORAL | Status: DC
  Administered 2020-03-09 – 2020-03-11 (×3): 5 mg via ORAL
  Filled 2020-03-09 (×3): qty 1

## 2020-03-09 MED ORDER — ENALAPRIL MALEATE 2.5 MG PO TABS
2.5000 mg | ORAL_TABLET | Freq: Every day | ORAL | Status: DC
  Administered 2020-03-10: 2.5 mg via ORAL
  Filled 2020-03-09 (×3): qty 1

## 2020-03-09 NOTE — Progress Notes (Addendum)
Inpatient Rehabilitation-Admissions Coordinator   Noted PT recommendations are now home with [facility] therapy as pt is ambulating at a Min G level 140 feet. As pt no longer requires an IP Rehab stay, AC will no longer follow.    Raechel Ache, OTR/L  Rehab Admissions Coordinator  [phone] 03/09/2020 5:06 PM

## 2020-03-09 NOTE — Plan of Care (Signed)
  Problem: Pain Managment: Goal: General experience of comfort will improve Outcome: Progressing   Problem: Activity: Goal: Risk for activity intolerance will decrease Outcome: Progressing   Problem: Nutrition: Goal: Adequate nutrition will be maintained Outcome: Progressing   Problem: Education: Goal: Knowledge of the prescribed therapeutic regimen will improve Outcome: Progressing

## 2020-03-09 NOTE — Progress Notes (Signed)
Physical Therapy Treatment Patient Details Name: Danny [name] MRN: [id number] DOB: 1951-06-01 Today's Date: 03/09/2020    History of Present Illness Patient is 69 y.o. male s/p Rt TKA on 03/08/20 with PMH significant for DM, Parkinsons, OA, GERD, HTN, HLD.    PT Comments    Pt requiring increased cues for technique as well as increased time to perform.  Pt assisted with ambulating in hallway with recliner following for safety (however not needed).  Pt also performed LE exercises.   Follow Up Recommendations  Home health PT;Supervision for mobility/OOB     Equipment Recommendations  None recommended by PT    Recommendations for Other Services       Precautions / Restrictions Precautions Precautions: Fall;Knee Precaution Comments: able to perform SLR Restrictions Weight Bearing Restrictions: No    Mobility  Bed Mobility Overal bed mobility: Needs Assistance Bed Mobility: Supine to Sit     Supine to sit: Min assist     General bed mobility comments: verbal cues for technique, pt assisted with LEs however requiring assist for trunk upright  Transfers Overall transfer level: Needs assistance Equipment used: Rolling walker (2 wheeled) Transfers: Sit to/from Stand Sit to Stand: Min assist;+2 safety/equipment;From elevated surface         General transfer comment: verbal cues for UE and LE positioning, assist to rise and steady as well as control descent  Ambulation/Gait Ambulation/Gait assistance: Min assist;+2 safety/equipment Gait Distance (Feet): 80 Feet Assistive device: Rolling walker (2 wheeled) Gait Pattern/deviations: Step-to pattern;Decreased stride length;Step-through pattern;Antalgic;Decreased weight shift to right     General Gait Details: verbal cues for sequencing, RW positioning, step length; pt able to improve with smoother step through pattern (start/stop with step to more difficult likely due to Parkinson's); initially min assist for guiding  RW   Stairs             Wheelchair Mobility    Modified Rankin (Stroke Patients Only)       Balance                                            Cognition Arousal/Alertness: Awake/alert Behavior During Therapy: WFL for tasks assessed/performed Overall Cognitive Status: Within Functional Limits for tasks assessed                                        Exercises Total Joint Exercises Ankle Circles/Pumps: AROM;Both;10 reps Quad Sets: AROM;Right;10 reps Short Arc Quad: AROM;Right;10 reps Heel Slides: AAROM;Right;10 reps;Seated Hip ABduction/ADduction: AROM;Right;10 reps Straight Leg Raises: AROM;Right;10 reps    General Comments        Pertinent Vitals/Pain Pain Assessment: Faces Faces Pain Scale: Hurts a little bit Pain Location: Rt knee Pain Descriptors / Indicators: Aching;Discomfort Pain Intervention(s): Limited activity within patient's tolerance;Premedicated before session;Ice applied;Repositioned    Home Living                      Prior Function            PT Goals (current goals can now be found in the care plan section) Progress towards PT goals: Progressing toward goals    Frequency    7X/week      PT Plan Discharge plan needs to be updated    Co-evaluation  AM-PAC PT "6 Clicks" Mobility   Outcome Measure  Help needed turning from your back to your side while in a flat bed without using bedrails?: A Little Help needed moving from lying on your back to sitting on the side of a flat bed without using bedrails?: A Little Help needed moving to and from a bed to a chair (including a wheelchair)?: A Little Help needed standing up from a chair using your arms (e.g., wheelchair or bedside chair)?: A Little Help needed to walk in hospital room?: A Little Help needed climbing 3-5 steps with a railing? : A Lot 6 Click Score: 17    End of Session Equipment Utilized During Treatment:  Gait belt Activity Tolerance: Patient tolerated treatment well Patient left: in chair;with call bell/phone within reach;with chair alarm set;with family/visitor present Nurse Communication: Mobility status PT Visit Diagnosis: Muscle weakness (generalized) (M62.81);Other abnormalities of gait and mobility (R26.89)     Time: [id number] PT Time Calculation (min) (ACUTE ONLY): 24 min  Charges:  $Gait Training: 8-22 mins $Therapeutic Exercise: 8-22 mins                     Danny [name], DPT Acute Rehabilitation Services Office: [phone]   Trena Platt 03/09/2020, 3:22 PM

## 2020-03-09 NOTE — Progress Notes (Signed)
Physical Therapy Treatment Patient Details Name: Danny [name] MRN: [id number] DOB: [birth year] Today's Date: 03/09/2020    History of Present Illness Patient is 69 y.o. male s/p Rt TKA on 03/08/20 with PMH significant for DM, Parkinsons, OA, GERD, HTN, HLD.    PT Comments    Pt up in bathroom with RN on arrival.  Pt assisted with ambulating in hallway and requires constant cues for technique and encouragement for step through pattern.  Spouse reports concern with pt discharging today.  Pt would benefit from another physical therapy session tomorrow with anticipation of d/c home.  Pt and spouse agreeable.  RN notified.     Follow Up Recommendations  Home health PT;Supervision for mobility/OOB     Equipment Recommendations  None recommended by PT    Recommendations for Other Services       Precautions / Restrictions Precautions Precautions: Fall;Knee Precaution Comments: able to perform SLR Restrictions Weight Bearing Restrictions: No    Mobility  Bed Mobility Overal bed mobility: Needs Assistance Bed Mobility: Sit to Supine     Supine to sit: Min assist Sit to supine: Min assist   General bed mobility comments: verbal cues for technique, assist for R LE  Transfers Overall transfer level: Needs assistance Equipment used: Rolling walker (2 wheeled) Transfers: Sit to/from Stand Sit to Stand: Min guard;From elevated surface         General transfer comment: verbal cues for UE and LE positioning, pt up in bathroom on arrival  Ambulation/Gait Ambulation/Gait assistance: Min guard Gait Distance (Feet): 140 Feet Assistive device: Rolling walker (2 wheeled) Gait Pattern/deviations: Decreased stride length;Step-through pattern;Antalgic;Decreased weight shift to right     General Gait Details: verbal cues for sequencing, RW positioning, step length; cues to start smoother step through pattern; close guard for safety   Stairs             Wheelchair  Mobility    Modified Rankin (Stroke Patients Only)       Balance                                            Cognition Arousal/Alertness: Awake/alert Behavior During Therapy: WFL for tasks assessed/performed Overall Cognitive Status: Within Functional Limits for tasks assessed                                        Exercises     General Comments        Pertinent Vitals/Pain Pain Assessment: Faces Faces Pain Scale: Hurts little more Pain Location: Rt knee Pain Descriptors / Indicators: Aching;Discomfort Pain Intervention(s): Repositioned;Monitored during session;Premedicated before session    Home Living                      Prior Function            PT Goals (current goals can now be found in the care plan section) Progress towards PT goals: Progressing toward goals    Frequency    7X/week      PT Plan Current plan remains appropriate    Co-evaluation              AM-PAC PT "6 Clicks" Mobility   Outcome Measure  Help needed turning from your back to your side while in  a flat bed without using bedrails?: A Little Help needed moving from lying on your back to sitting on the side of a flat bed without using bedrails?: A Little Help needed moving to and from a bed to a chair (including a wheelchair)?: A Little Help needed standing up from a chair using your arms (e.g., wheelchair or bedside chair)?: A Little Help needed to walk in hospital room?: A Little Help needed climbing 3-5 steps with a railing? : A Lot 6 Click Score: 17    End of Session Equipment Utilized During Treatment: Gait belt Activity Tolerance: Patient tolerated treatment well Patient left: in bed;with call bell/phone within reach;with bed alarm set;with family/visitor present Nurse Communication: Mobility status PT Visit Diagnosis: Muscle weakness (generalized) (M62.81);Other abnormalities of gait and mobility (R26.89)     Time:  [id number] PT Time Calculation (min) (ACUTE ONLY): 18 min  Charges:  $Gait Training: 8-22 mins                    Arlyce Dice, DPT Acute Rehabilitation Services Office: [phone]   Trena Platt 03/09/2020, 3:27 PM

## 2020-03-09 NOTE — TOC Initial Note (Signed)
Transition of Care [facility]) - Initial/Assessment Note    Patient Details  Name: Danny [name] MRN: [id number] Date of Birth: 06-Aug-1951  [facility]) CM/SW Contact:    Leeroy Cha, RN Phone Number: 03/09/2020, 10:11 AM  Clinical Narrative:                 dcd to home  Expected Discharge Plan: [place] Barriers to Discharge: No Barriers Identified   Patient Goals and CMS Choice Patient states their goals for this hospitalization and ongoing recovery are:: to go home CMS Medicare.gov Compare Post Acute Care list provided to:: Patient Choice offered to / list presented to : Patient  Expected Discharge Plan and Services Expected Discharge Plan: [place]   Discharge Planning Services: CM Consult Post Acute Care Choice: Durable Medical Equipment, Home Health Living arrangements for the past 2 months: Single Family Home                           HH Arranged: PT HH [facility]: [facility]) Date [place]: 03/09/20 Time [place]: 1010 Representative spoke with at [place]: [place] Arrangements/Services Living arrangements for the past 2 months: [place] with:: Spouse Patient language and need for interpreter reviewed:: No Do you feel safe going back to the place where you live?: Yes      Need for Family Participation in Patient Care: Yes (Comment) Care giver support system in place?: Yes (comment)   Criminal Activity/Legal Involvement Pertinent to Current Situation/Hospitalization: No - Comment as needed  Activities of Daily Living Home Assistive Devices/Equipment: Dentures (specify type), Eyeglasses, Walker (specify type), Cane (specify quad or straight), CBG Meter, Raised toilet seat with rails, Grab bars in shower ADL Screening (condition at time of admission) Patient's cognitive ability adequate to safely complete daily activities?:  Yes Is the patient deaf or have difficulty hearing?: No Does the patient have difficulty seeing, even when wearing glasses/contacts?: No Does the patient have difficulty concentrating, remembering, or making decisions?: No Patient able to express need for assistance with ADLs?: Yes Does the patient have difficulty dressing or bathing?: No Independently performs ADLs?: Yes (appropriate for developmental age) Does the patient have difficulty walking or climbing stairs?: No Weakness of Legs: Right Weakness of Arms/Hands: None  Permission Sought/Granted Permission sought to share information with : Case Manager Permission granted to share information with : Yes, Verbal Permission Granted              Emotional Assessment Appearance:: Appears stated age     Orientation: : Oriented to Self, Oriented to Place, Oriented to  Time, Oriented to Situation Alcohol / Substance Use: Not Applicable Psych Involvement: No (comment)  Admission diagnosis:  Unilateral primary osteoarthritis, right knee [M17.11] Patient Active Problem List   Diagnosis Date Noted  . Hematospermia 11/18/2015  . Epididymal cyst 05/25/2015  . Parkinson disease ([place]) 01/14/2014  . Obesity (BMI 30-39.9) 07/14/2013  . Confusional arousals 02/13/2013  . Disorder of ligament of right wrist 01/29/2012  . Diabetes mellitus type 2, uncomplicated ([place]) 99991111  . Mixed hyperlipidemia 10/09/2011  . Unilateral primary osteoarthritis, right knee 10/09/2011  . Essential hypertension, benign 10/09/2011   PCP:  [name], PA Pharmacy:   [id number] - [place], [place] [address] [place] [address] Phone: [phone] Fax: [phone]  [facility] -  [place], [place] - [address] [place] [address] Phone: [phone] Fax: [phone]     Social Determinants of Health (SDOH) Interventions    Readmission Risk  Interventions No flowsheet data found.

## 2020-03-09 NOTE — Op Note (Signed)
PATIENT ID: Danny [name]        MRN:  [id number]          DOB/AGE: 69-17-1952 / 69 y.o.    Joni Fears, MD   Biagio Borg, PA-C [address] [place], [place]  [address]                             [phone]   PROGRESS NOTE  Subjective:  negative for Chest Pain  negative for Shortness of Breath  negative for Nausea/Vomiting   negative for Calf Pain    Tolerating Diet: yes         Patient reports pain as mild.     Good night-adductor canal block still effective  Objective: Vital signs in last 24 hours:    Patient Vitals for the past 24 hrs:  BP Temp Temp src Pulse Resp SpO2  03/09/20 0537 (!) 152/84 98.2 F (36.8 C) Oral 72 18 95 %  03/09/20 0152 (!) 145/80 98.4 F (36.9 C) Oral 77 14 94 %  03/08/20 2058 (!) 147/85 98.4 F (36.9 C) Oral (!) 103 16 96 %  03/08/20 1500 (!) 165/88 97.7 F (36.5 C) Oral 88 16 99 %  03/08/20 1400 (!) 157/77 (!) 97.5 F (36.4 C) Oral 78 16 97 %  03/08/20 1207 (!) 171/98 97.8 F (36.6 C) Oral 82 16 97 %  03/08/20 1145 (!) 163/99 97.8 F (36.6 C) - 80 12 98 %  03/08/20 1140 (!) 165/95 - - 81 12 98 %  03/08/20 1135 (!) 163/102 - - 79 14 98 %  03/08/20 1130 (!) 168/93 - - 85 12 98 %  03/08/20 1125 (!) 167/101 - - 87 16 97 %  03/08/20 1120 (!) 174/98 - - 88 11 97 %  03/08/20 1115 (!) 179/103 - - 88 13 97 %  03/08/20 1110 (!) 170/100 - - 85 12 97 %  03/08/20 1101 (!) 173/98 - - 83 11 97 %  03/08/20 1100 (!) 171/99 - - 87 12 97 %  03/08/20 1045 (!) 174/91 - - 87 14 97 %  03/08/20 1030 (!) 165/89 - - 89 13 96 %  03/08/20 1015 (!) 169/92 - - 91 13 96 %  03/08/20 1000 (!) 174/89 - - 90 14 96 %  03/08/20 0945 (!) 181/93 - - 86 13 100 %  03/08/20 0938 (!) 166/98 97.7 F (36.5 C) - 95 15 99 %      Intake/Output from previous day:   03/23 0701 - 03/24 0700 In: 3055.9 [P.O.:285; I.V.:2419.3] Out: [id number] [Urine:2400]   Intake/Output this shift:   No intake/output data recorded.   Intake/Output      03/23 0701 - 03/24 0700  03/24 0701 - 03/25 0700   P.O. 285    I.V. (mL/kg) 2419.3 (23.7)    Other 0    IV Piggyback 351.7    Total Intake(mL/kg) 3055.9 (29.9)    Urine (mL/kg/hr) 2400 (1)    Blood 75    Total Output 2475    Net +580.9            LABORATORY DATA: Recent Labs    03/09/20 0348  WBC 14.9*  HGB 11.7*  HCT 36.9*  PLT 170   Recent Labs    03/09/20 0348  NA 137  K 4.2  CL 108  CO2 21*  BUN 25*  CREATININE 0.96  GLUCOSE 162*  CALCIUM 8.8*   Lab  Results  Component Value Date   INR 1.0 03/01/2020    Recent Radiographic Studies :  No results found.   Examination:  General appearance: alert, cooperative and no distress  Wound Exam: clean, dry, intact   Drainage:  None: wound tissue dry  Motor Exam: EHL, FHL, Anterior Tibial and Posterior Tibial Intact  Sensory Exam: Superficial Peroneal, Deep Peroneal and Tibial normal  Vascular Exam: Normal  Assessment:    1 Day Post-Op  Procedure(s) (LRB): RIGHT TOTAL KNEE ARTHROPLASTY (Right)  ADDITIONAL DIAGNOSIS:  Active Problems:   Unilateral primary osteoarthritis, right knee diabetes, Parkinson's    Plan: Physical Therapy as ordered Weight Bearing as Tolerated (WBAT)  DVT Prophylaxis:  Aspirin and TED hose  DISCHARGE PLAN: not sure yet-PT needs to evaluate.possible rehab as wife is unable to care at home if unsteady with his gait and Parkinson's  DISCHARGE NEEDS: HHPT, CPM, Walker and 3-in-1 comode seat  Anticipated LOS equal to or greater than 2 midnights due to - Age 69 and older with one or more of the following:  - Obesity  - Expected need for hospital services (PT, OT, Nursing) required for safe  discharge  - Anticipated need for postoperative skilled nursing care or inpatient rehab  - Active co-morbidities: Diabetes and Parkinson's OR   - Unanticipated findings during/Post Surgery: Slow post-op progression: GI, pain control, mobility  - Patient is a high risk of re-admission due to: Barriers to  post-acute care (logistical, no family support in home)   Right knee wound clean and dry-dressing changed to aquacell. Foley out. No calf pain or SOB.Pain minimal. Issue is progress in PT and wife's ability to care for at home. Will contact social services in case rehab necessary     [place], [place]  03/09/2020 7:44 AM

## 2020-03-10 ENCOUNTER — Telehealth: Payer: Self-pay | Admitting: Neurology

## 2020-03-10 LAB — GLUCOSE, CAPILLARY
Glucose-Capillary: 168 mg/dL — ABNORMAL HIGH (ref 70–99)
Glucose-Capillary: 197 mg/dL — ABNORMAL HIGH (ref 70–99)
Glucose-Capillary: 145 mg/dL — ABNORMAL HIGH (ref 70–99)
Glucose-Capillary: 134 mg/dL — ABNORMAL HIGH (ref 70–99)

## 2020-03-10 LAB — BASIC METABOLIC PANEL
Anion gap: 10 (ref 5–15)
BUN: 17 mg/dL (ref 8–23)
CO2: 23 mmol/L (ref 22–32)
Calcium: 8.9 mg/dL (ref 8.9–10.3)
Chloride: 104 mmol/L (ref 98–111)
Creatinine, Ser: 0.93 mg/dL (ref 0.61–1.24)
GFR calc Af Amer: >60 mL/min (ref >60)
GFR calc non Af Amer: >60 mL/min (ref >60)
Glucose, Bld: 152 mg/dL — ABNORMAL HIGH (ref 70–99)
Potassium: 4.7 mmol/L (ref 3.5–5.1)
Sodium: 137 mmol/L (ref 135–145)

## 2020-03-10 LAB — CBC
HCT: 41.8 % (ref 39.0–52.0)
Hemoglobin: 13.4 g/dL (ref 13.0–17.0)
MCH: 31.2 pg (ref 26.0–34.0)
MCHC: 32.1 g/dL (ref 30.0–36.0)
MCV: 97.2 fL (ref 80.0–100.0)
Platelets: 171 10*3/uL (ref 150–400)
RBC: 4.3 MIL/uL (ref 4.22–5.81)
RDW: 14.5 % (ref 11.5–15.5)
WBC: 12.5 10*3/uL — ABNORMAL HIGH (ref 4.0–10.5)
nRBC: 0 % (ref 0.0–0.2)

## 2020-03-10 LAB — SARS CORONAVIRUS 2 (TAT 6-24 HRS): SARS Coronavirus 2: NEGATIVE

## 2020-03-10 NOTE — Progress Notes (Signed)
Physical Therapy Treatment Patient Details Name: Danny [name] MRN: [id number] DOB: July 12, 1951 Today's Date: 03/10/2020    History of Present Illness Patient is 69 y.o. male s/p Rt TKA on 03/08/20 with PMH significant for DM, Parkinsons, OA, GERD, HTN, HLD.    PT Comments    Pt requiring more assist today for mobility.  Pt with stiff and painful movement.  Pt only able to tolerate 20 feet of ambulation and then assisted back to bed.  Updated d/c recommendations to SNF as spouse is not able to care for pt at current assist level.   Follow Up Recommendations  SNF     Equipment Recommendations  None recommended by PT    Recommendations for Other Services       Precautions / Restrictions Precautions Precautions: Fall;Knee Precaution Comments: unable to perform SLR Restrictions Weight Bearing Restrictions: No    Mobility  Bed Mobility Overal bed mobility: Needs Assistance Bed Mobility: Sit to Supine       Sit to supine: Max assist;+2 for safety/equipment;+2 for physical assistance   General bed mobility comments: assist for trunk descent and lower body onto bed  Transfers Overall transfer level: Needs assistance Equipment used: Rolling walker (2 wheeled) Transfers: Sit to/from Stand Sit to Stand: Max assist;Mod assist;+2 physical assistance         General transfer comment: verbal cues for UE and LE positioning, pt unable to rise without significant assist, cues for weight shifting and finding BOS upon standing  Ambulation/Gait Ambulation/Gait assistance: Min assist;+2 safety/equipment Gait Distance (Feet): 20 Feet Assistive device: Rolling walker (2 wheeled) Gait Pattern/deviations: Antalgic;Decreased weight shift to right;Step-to pattern     General Gait Details: verbal cues for sequencing, RW positioning, step length; increased time and effort; assist to stabilize, recliner following   Stairs             Wheelchair Mobility    Modified Rankin  (Stroke Patients Only)       Balance                                            Cognition Arousal/Alertness: Awake/alert Behavior During Therapy: WFL for tasks assessed/performed Overall Cognitive Status: Within Functional Limits for tasks assessed                                        Exercises      General Comments        Pertinent Vitals/Pain Pain Assessment: Faces Faces Pain Scale: Hurts even more Pain Location: Rt knee Pain Descriptors / Indicators: Aching;Discomfort Pain Intervention(s): Repositioned;Monitored during session;Ice applied    Home Living                      Prior Function            PT Goals (current goals can now be found in the care plan section) Progress towards PT goals: Progressing toward goals    Frequency    7X/week      PT Plan Discharge plan needs to be updated    Co-evaluation              AM-PAC PT "6 Clicks" Mobility   Outcome Measure  Help needed turning from your back to your side while in a flat  bed without using bedrails?: A Lot Help needed moving from lying on your back to sitting on the side of a flat bed without using bedrails?: A Lot Help needed moving to and from a bed to a chair (including a wheelchair)?: A Lot Help needed standing up from a chair using your arms ([name].g., wheelchair or bedside chair)?: A Lot Help needed to walk in hospital room?: A Little Help needed climbing 3-5 steps with a railing? : Total 6 Click Score: 12    End of Session Equipment Utilized During Treatment: Gait belt Activity Tolerance: Patient limited by fatigue;Patient limited by pain Patient left: in bed;with call bell/phone within reach;with family/visitor present   PT Visit Diagnosis: Muscle weakness (generalized) (M62.81);Other abnormalities of gait and mobility (R26.89)     Time: [id number] PT Time Calculation (min) (ACUTE ONLY): 22 min  Charges:  $Gait Training: 8-22  mins                     Danny [name], DPT Acute Rehabilitation Services Office: [phone]  Danny [name],Danny [name] 03/10/2020, 12:12 PM

## 2020-03-10 NOTE — TOC Progression Note (Signed)
Transition of Care [facility]) - Progression Note    Patient Details  Name: Danny [name] MRN: [id number] Date of Birth: [birth year]  Transition of Care [facility]) CM/SW Contact  Leeroy Cha, RN Phone Number: 03/10/2020, 9:30 AM  Clinical Narrative:    Spoke with wife she wants pt to go to [facility] or [facility] [place]. States that with his parkinson's kicking in that she can not safely handle him at home.  Message to md sent via chart.   Expected Discharge Plan: [place] Barriers to Discharge: No Barriers Identified  Expected Discharge Plan and Services Expected Discharge Plan: [place]   Discharge Planning Services: CM Consult Post Acute Care Choice: Durable Medical Equipment, Home Health Living arrangements for the past 2 months: Single Family Home                           HH Arranged: PT [place]: [facility] Date [place]: 03/09/20 Time [place]: 1010 Representative spoke with at [place]: mike   Social Determinants of Health ([place]) Interventions    Readmission Risk Interventions No flowsheet data found.

## 2020-03-10 NOTE — TOC Progression Note (Addendum)
Transition of Care [facility]) - Progression Note    Patient Details  Name: Danny [name] MRN: [id number] Date of Birth: July 10, 1951  Transition of Care [facility]) CM/SW Contact  Leeroy Cha, RN Phone Number: 03/10/2020, 10:06 AM  Clinical Narrative:    Wife eants patient to go to snf for rehab due to parkinsons condition. Faxed out the fl2 to the area ,  Would prefer [facility] and [facility] Bed is obtained at [facility] in [place] dr [place] notified awaiting covid test.  Expected Discharge Plan: Skilled Nursing Facility Barriers to Discharge: SNF Pending bed offer  Expected Discharge Plan and Services Expected Discharge Plan: [place]   Discharge Planning Services: CM Consult Post Acute Care Choice: [place] Living arrangements for the past 2 months: Single Family Home                           HH Arranged: PT HH [facility] Date [place]: 03/09/20 Time [place]: 1010 Representative spoke with at [place]: mike   Social Determinants of Health ([place]) Interventions    Readmission Risk Interventions No flowsheet data found.

## 2020-03-10 NOTE — Telephone Encounter (Signed)
Pt wife called states pt is currently admitted at [facility] post his knee replacement surgery. Pt wife states the knee is doing well but his parkinsons has kicked in worsening and would like to know what options are available to he can think straight states pt cannot walk.

## 2020-03-10 NOTE — Telephone Encounter (Signed)
Please call wife back: Unfortunately, at this point there is no new suggestions I have for them. As discussed, patients with Parkinson's disease can have a longer recovery time and some setback with their Parkinson's symptoms, typically linked to irregularities in the medication timing and also due to having to take pain medication and secondary to temporarily being more immobile.

## 2020-03-10 NOTE — NC FL2 (Signed)
[place] LEVEL OF CARE SCREENING TOOL     IDENTIFICATION  Patient Name: Danny [name] Birthdate: 01-27-1951 Sex: male Admission Date (Current Location): 03/08/2020  [facility] and [place] Number:  Herbalist and Address:  [facility],  [place], [place]      Provider Number: [id number]  Attending Physician Name and Address:  Garald Balding, MD  Relative Name and Phone Number:       Current Level of Care: Hospital Recommended Level of Care: [place] Prior Approval Number:    Date Approved/Denied:   PASRR Number: [id number]  Discharge Plan: SNF    Current Diagnoses: Patient Active Problem List   Diagnosis Date Noted  . Hematospermia 11/18/2015  . Epididymal cyst 05/25/2015  . Parkinson disease ([place]) 01/14/2014  . Obesity (BMI 30-39.9) 07/14/2013  . Confusional arousals 02/13/2013  . Disorder of ligament of right wrist 01/29/2012  . Diabetes mellitus type 2, uncomplicated ([place]) 99991111  . Mixed hyperlipidemia 10/09/2011  . Unilateral primary osteoarthritis, right knee 10/09/2011  . Essential hypertension, benign 10/09/2011    Orientation RESPIRATION BLADDER Height & Weight     Self, Time, Situation, Place  Normal Continent Weight: 102.1 kg Height:  5\' 10"  (177.8 cm)  BEHAVIORAL SYMPTOMS/MOOD NEUROLOGICAL BOWEL NUTRITION STATUS      Continent Diet(regular)  AMBULATORY STATUS COMMUNICATION OF NEEDS Skin   Extensive Assist Verbally Normal                       Personal Care Assistance Level of Assistance  Bathing, Feeding, Dressing Bathing Assistance: Limited assistance Feeding assistance: Limited assistance Dressing Assistance: Limited assistance     Functional Limitations Info  Sight, Hearing, Speech Sight Info: Adequate Hearing Info: Adequate Speech Info: Adequate    SPECIAL CARE FACTORS FREQUENCY  PT (By licensed PT)     PT Frequency: 5 x weekly               Contractures Contractures Info: Present    Additional Factors Info  Code Status Code Status Info: full             Current Medications (03/10/2020):  This is the current hospital active medication list Current Facility-Administered Medications  Medication Dose Route Frequency Provider Last Rate Last Admin  . 0.9 %  sodium chloride infusion  75 mL/hr Intravenous Continuous Cherylann Ratel, PA-C 75 mL/hr at 03/09/20 0450 75 mL/hr at 03/09/20 0450  . alum & mag hydroxide-simeth (MAALOX/MYLANTA) 200-200-20 MG/5ML suspension 30 mL  30 mL Oral Q4H PRN Petrarca, Brian D, PA-C      . aspirin chewable tablet 81 mg  81 mg Oral BID Cherylann Ratel, PA-C   81 mg at 03/10/20 0909  . bisacodyl (DULCOLAX) suppository 10 mg  10 mg Rectal Daily PRN Biagio Borg D, PA-C      . carbidopa-levodopa (SINEMET IR) 25-100 MG per tablet immediate release 1 tablet  1 tablet Oral 5 X Daily Garald Balding, MD   1 tablet at 03/10/20 [phone]  . diphenhydrAMINE (BENADRYL) 12.5 MG/5ML elixir 12.5-25 mg  12.5-25 mg Oral Q4H PRN Biagio Borg D, PA-C      . docusate sodium (COLACE) capsule 100 mg  100 mg Oral BID Biagio Borg D, PA-C   100 mg at 03/10/20 0908  . enalapril (VASOTEC) tablet 2.5 mg  2.5 mg Oral Daily Cherylann Ratel, PA-C   2.5 mg at 03/10/20 [id number]  . famotidine (PEPCID)  tablet 20 mg  20 mg Oral BID Cherylann Ratel, PA-C   20 mg at 03/10/20 [id number]  . glipiZIDE (GLUCOTROL XL) 24 hr tablet 10 mg  10 mg Oral QHS Cherylann Ratel, PA-C   10 mg at 03/09/20 2124  . HYDROmorphone (DILAUDID) injection 0.5-1 mg  0.5-1 mg Intravenous Q4H PRN Petrarca, Brian D, PA-C      . insulin aspart (novoLOG) injection 0-15 Units  0-15 Units Subcutaneous TID WC Cherylann Ratel, PA-C   3 Units at 03/10/20 0912  . insulin aspart (novoLOG) injection 0-5 Units  0-5 Units Subcutaneous QHS Cherylann Ratel, PA-C   4 Units at 03/08/20 2200  . linagliptin (TRADJENTA) tablet 5 mg  5 mg Oral Daily Garald Balding, MD    5 mg at 03/10/20 [id number]  . magnesium citrate solution 1 Bottle  1 Bottle Oral Once PRN Biagio Borg D, PA-C      . magnesium hydroxide (MILK OF MAGNESIA) suspension 30 mL  30 mL Oral Daily PRN Petrarca, Mike Craze, PA-C      . menthol-cetylpyridinium (CEPACOL) lozenge 3 mg  1 lozenge Oral PRN Biagio Borg D, PA-C       Or  . phenol (CHLORASEPTIC) mouth spray 1 spray  1 spray Mouth/Throat PRN Petrarca, Mike Craze, PA-C      . metFORMIN (GLUCOPHAGE) tablet 1,000 mg  1,000 mg Oral BID WC Biagio Borg D, PA-C   1,000 mg at 03/10/20 0909  . methocarbamol (ROBAXIN) tablet 500 mg  500 mg Oral Q6H PRN Cherylann Ratel, PA-C   500 mg at 03/10/20 0522   Or  . methocarbamol (ROBAXIN) 500 mg in dextrose 5 % 50 mL IVPB  500 mg Intravenous Q6H PRN Cherylann Ratel, PA-C   Stopped at 03/08/20 1031  . metoCLOPramide (REGLAN) tablet 5-10 mg  5-10 mg Oral Q8H PRN Biagio Borg D, PA-C       Or  . metoCLOPramide (REGLAN) injection 5-10 mg  5-10 mg Intravenous Q8H PRN Petrarca, Brian D, PA-C      . ondansetron (ZOFRAN) tablet 4 mg  4 mg Oral Q6H PRN Biagio Borg D, PA-C       Or  . ondansetron (ZOFRAN) injection 4 mg  4 mg Intravenous Q6H PRN Biagio Borg D, PA-C      . oxyCODONE (Oxy IR/ROXICODONE) immediate release tablet 5-10 mg  5-10 mg Oral Q4H PRN Biagio Borg D, PA-C   10 mg at 03/09/20 1804  . pramipexole (MIRAPEX) tablet 0.5 mg  0.5 mg Oral TID Garald Balding, MD   0.5 mg at 03/10/20 0908  . simvastatin (ZOCOR) tablet 20 mg  20 mg Oral QODAY Cherylann Ratel, PA-C   20 mg at 03/09/20 1722     Discharge Medications: Please see discharge summary for a list of discharge medications.  Relevant Imaging Results:  Relevant Lab Results:   Additional Information [id number]  Leeroy Cha, RN

## 2020-03-10 NOTE — Op Note (Signed)
PATIENT ID: Danny [name]        MRN:  [id number]          DOB/AGE: 69-Sep-1952 / 69 y.o.    Danny Fears, MD   Danny Borg, PA-C [address] [place], [place]  [address]                             [phone]   PROGRESS NOTE  Subjective:  negative for Chest Pain  negative for Shortness of Breath  negative for Nausea/Vomiting   negative for Calf Pain    Tolerating Diet: yes         Patient reports pain as moderate.     More pain as adductor canal block wears off  Objective: Vital signs in last 24 hours:    Patient Vitals for the past 24 hrs:  BP Temp Temp src Pulse Resp SpO2  03/10/20 0519 (!) 165/86 99 F (37.2 C) Oral (!) 102 16 97 %  03/09/20 2005 (!) 166/93 98.3 F (36.8 C) Oral 94 16 98 %  03/09/20 1333 118/65 97.9 F (36.6 C) Oral 91 16 98 %      Intake/Output from previous day:   03/24 0701 - 03/25 0700 In: 540 [P.O.:540] Out: 3075 [Urine:3075]   Intake/Output this shift:   No intake/output data recorded.   Intake/Output      03/24 0701 - 03/25 0700 03/25 0701 - 03/26 0700   P.O. 540    I.V. (mL/kg)     Other     IV Piggyback     Total Intake(mL/kg) 540 (5.3)    Urine (mL/kg/hr) 3075 (1.3)    Emesis/NG output 0    Stool 0    Blood     Total Output 3075    Net -2535         Urine Occurrence 4 x 1 x   Stool Occurrence 0 x 1 x   Emesis Occurrence 0 x       LABORATORY DATA: Recent Labs    03/09/20 0348 03/10/20 0305  WBC 14.9* 12.5*  HGB 11.7* 13.4  HCT 36.9* 41.8  PLT 170 171   Recent Labs    03/09/20 0348 03/10/20 0305  NA 137 137  K 4.2 4.7  CL 108 104  CO2 21* 23  BUN 25* 17  CREATININE 0.96 0.93  GLUCOSE 162* 152*  CALCIUM 8.8* 8.9   Lab Results  Component Value Date   INR 1.0 03/01/2020    Recent Radiographic Studies :  No results found.   Examination:  General appearance: alert, cooperative and no distress  Wound Exam: clean, dry, intact   Drainage:  None: wound tissue dry  Motor Exam: EHL, FHL  and Anterior Tibial Intact  Sensory Exam: Superficial Peroneal, Deep Peroneal and Tibial normal  Vascular Exam: Normal  Assessment:    2 Days Post-Op  Procedure(s) (LRB): RIGHT TOTAL KNEE ARTHROPLASTY (Right)  ADDITIONAL DIAGNOSIS:  Active Problems:   Unilateral primary osteoarthritis, right knee  Some confusion noted by wife but has had similar episodes at home   Plan: Physical Therapy as ordered Weight Bearing as Tolerated (WBAT)  DVT Prophylaxis:  Aspirin and TED hose  DISCHARGE PLAN: Skilled Nursing Facility/Rehab  DISCHARGE NEEDS: HHPT, CPM, Walker and 3-in-1 comode seat  Anticipated LOS equal to or greater than 2 midnights due to - Age 69 and older with one or more of the following:  - Obesity  -  Expected need for hospital services (PT, OT, Nursing) required for safe  discharge  - Anticipated need for postoperative skilled nursing care or inpatient rehab  - Active co-morbidities: Diabetes,Parkinson's OR   - Unanticipated findings during/Post Surgery: Slow post-op progression: GI, pain control, mobility  - Patient is a high risk of re-admission due to: Barriers to post-acute care (logistical, no family support in home)  Mrs Deso does not feel comfortable caring for [name] with his weakness and unsteady gait related to Parkinson's-will need SNF, Right knee wound clean and dry. No calf pain. Good pulses. Seems oriented this am and comfortable      Danny Borg, PA-C [place]  03/10/2020 10:38 AM

## 2020-03-10 NOTE — Progress Notes (Signed)
Physical Therapy Treatment Patient Details Name: Danny [name] MRN: [id number] DOB: May 29, 1951 Today's Date: 03/10/2020    History of Present Illness Patient is 69 y.o. male s/p Rt TKA on 03/08/20 with PMH significant for DM, Parkinsons, OA, GERD, HTN, HLD.    PT Comments    Pt requesting to get OOB with nurse tech in room on arrival.  Applied pt's [employer] for safety and assisted pt with ambulating.  Pt only able to tolerate short distance due to pain and fatigue.  Pt assisted with using urinal prior to return to bed.  Continue to recommend SNF upon d/c.    Follow Up Recommendations  SNF     Equipment Recommendations  None recommended by PT    Recommendations for Other Services       Precautions / Restrictions Precautions Precautions: Fall;Knee Precaution Comments: unable to perform SLR Required Braces or Orthoses: Knee Immobilizer - Right Restrictions Weight Bearing Restrictions: No    Mobility  Bed Mobility Overal bed mobility: Needs Assistance Bed Mobility: Sit to Supine       Sit to supine: Max assist   General bed mobility comments: pt sitting EOB with nurse tech on arrival; assist for trunk descent and lower body onto bed  Transfers Overall transfer level: Needs assistance Equipment used: Rolling walker (2 wheeled) Transfers: Sit to/from Stand Sit to Stand: Mod assist;From elevated surface;+2 safety/equipment         General transfer comment: verbal cues for UE and LE positioning, pt unable to rise without significant assist and elevated bed, cues for weight shifting and finding BOS upon standing  Ambulation/Gait Ambulation/Gait assistance: Min assist Gait Distance (Feet): 20 Feet Assistive device: Rolling walker (2 wheeled) Gait Pattern/deviations: Antalgic;Decreased weight shift to right;Step-to pattern     General Gait Details: verbal cues for sequencing, RW positioning, step length; increased time and effort; assist to stabilize; fatigues quickly;  slow stiff movements and required max cues for sequencing   Stairs             Wheelchair Mobility    Modified Rankin (Stroke Patients Only)       Balance                                            Cognition Arousal/Alertness: Awake/alert Behavior During Therapy: WFL for tasks assessed/performed Overall Cognitive Status: Within Functional Limits for tasks assessed                                 General Comments: speech has been less clear today compared to yesterday      Exercises      General Comments        Pertinent Vitals/Pain Pain Assessment: Faces Faces Pain Scale: Hurts even more Pain Location: Rt knee Pain Descriptors / Indicators: Aching;Discomfort;Sore Pain Intervention(s): Repositioned;Monitored during session;Ice applied    Home Living                      Prior Function            PT Goals (current goals can now be found in the care plan section) Progress towards PT goals: Progressing toward goals    Frequency    7X/week      PT Plan Current plan remains appropriate    Co-evaluation  AM-PAC PT "6 Clicks" Mobility   Outcome Measure  Help needed turning from your back to your side while in a flat bed without using bedrails?: A Lot Help needed moving from lying on your back to sitting on the side of a flat bed without using bedrails?: A Lot Help needed moving to and from a bed to a chair (including a wheelchair)?: A Lot Help needed standing up from a chair using your arms (e.g., wheelchair or bedside chair)?: A Lot Help needed to walk in hospital room?: A Little Help needed climbing 3-5 steps with a railing? : Total 6 Click Score: 12    End of Session Equipment Utilized During Treatment: Gait belt Activity Tolerance: Patient limited by fatigue;Patient limited by pain Patient left: in bed;with call bell/phone within reach;with family/visitor present;with bed alarm set    PT Visit Diagnosis: Muscle weakness (generalized) (M62.81);Other abnormalities of gait and mobility (R26.89)     Time: [id number] PT Time Calculation (min) (ACUTE ONLY): 20 min  Charges:  $Gait Training: 8-22 mins                     Arlyce Dice, DPT Acute Rehabilitation Services Office: [phone]  Trena Platt 03/10/2020, 3:27 PM

## 2020-03-10 NOTE — Telephone Encounter (Signed)
I call pt to give her Dr. Rexene Alberts recommendations listed below. The wife stated Dr Rexene Alberts did discuss that pt would have some set back after having surgery due to pain meds, parkinson. The wife stated her husband will probably go to a rehab facility temporary and than come home. Pt was off his simenet when he had surgery now he is back on. The appreciate the call and verbalized understanding.

## 2020-03-11 LAB — CBC
HCT: 39.1 % (ref 39.0–52.0)
Hemoglobin: 12.5 g/dL — ABNORMAL LOW (ref 13.0–17.0)
MCH: 31 pg (ref 26.0–34.0)
MCHC: 32 g/dL (ref 30.0–36.0)
MCV: 97 fL (ref 80.0–100.0)
Platelets: 168 10*3/uL (ref 150–400)
RBC: 4.03 MIL/uL — ABNORMAL LOW (ref 4.22–5.81)
RDW: 14.5 % (ref 11.5–15.5)
WBC: 12.9 10*3/uL — ABNORMAL HIGH (ref 4.0–10.5)
nRBC: 0 % (ref 0.0–0.2)

## 2020-03-11 LAB — GLUCOSE, CAPILLARY
Glucose-Capillary: 139 mg/dL — ABNORMAL HIGH (ref 70–99)
Glucose-Capillary: 196 mg/dL — ABNORMAL HIGH (ref 70–99)

## 2020-03-11 LAB — BASIC METABOLIC PANEL
Anion gap: 10 (ref 5–15)
BUN: 25 mg/dL — ABNORMAL HIGH (ref 8–23)
CO2: 22 mmol/L (ref 22–32)
Calcium: 8.8 mg/dL — ABNORMAL LOW (ref 8.9–10.3)
Chloride: 103 mmol/L (ref 98–111)
Creatinine, Ser: 1.03 mg/dL (ref 0.61–1.24)
GFR calc Af Amer: >60 mL/min (ref >60)
GFR calc non Af Amer: >60 mL/min (ref >60)
Glucose, Bld: 140 mg/dL — ABNORMAL HIGH (ref 70–99)
Potassium: 4.2 mmol/L (ref 3.5–5.1)
Sodium: 135 mmol/L (ref 135–145)

## 2020-03-11 MED ORDER — METFORMIN HCL 1000 MG PO TABS: 1000.0000 mg | ORAL_TABLET | Freq: Two times a day (BID) | ORAL | 0 refills | Status: DC

## 2020-03-11 MED ORDER — LINAGLIPTIN 5 MG PO TABS: 5.0000 mg | ORAL_TABLET | Freq: Every day | ORAL | 0 refills | Status: DC

## 2020-03-11 MED ORDER — OXYCODONE HCL 5 MG PO TABS: 5.0000 mg | ORAL_TABLET | ORAL | 0 refills | Status: DC | PRN

## 2020-03-11 MED ORDER — METHOCARBAMOL 500 MG PO TABS: 500.0000 mg | ORAL_TABLET | Freq: Three times a day (TID) | ORAL | 0 refills | Status: DC | PRN

## 2020-03-11 MED ORDER — ASPIRIN 81 MG PO CHEW: 81.0000 mg | CHEWABLE_TABLET | Freq: Two times a day (BID) | ORAL | Status: DC

## 2020-03-11 NOTE — Discharge Summary (Signed)
Joni Fears, MD   Biagio Borg, PA-C [address] [place], [place]  [address]                             [phone]  PATIENT ID: Danny [name]        MRN:  [id number]          DOB/AGE: Aug 30, 1951 / 69 y.o.    DISCHARGE SUMMARY  ADMISSION DATE:    03/08/2020 DISCHARGE DATE:   03/11/2020   ADMISSION DIAGNOSIS: Unilateral primary osteoarthritis, right knee [M17.11]    DISCHARGE DIAGNOSIS:  right knee osteoarthritis    ADDITIONAL DIAGNOSIS: Active Problems:   Unilateral primary osteoarthritis, right knee  Past Medical History:  Diagnosis Date  . Allergy    SEASONAL  . Cataract    BILATERAL-REMOVED  . Essential hypertension, benign    PT.DENIES ON AS PREVENTIVE 08/17/19  . GERD (gastroesophageal reflux disease)   . Hx of adenomatous polyp of colon 09/04/2019   08/2019 diminutive adenoma No recall given findings and age  . Mixed hyperlipidemia    PT.DENIES,STATED ON AS PREVENTIVE 07/30/19  . Neuromuscular disorder (HCC)    PARKINSONS  . OA (osteoarthritis) of knee    right  . Obesity, unspecified   . Osteoarthritis    KNEE AND SHOULDERS  . Parkinsons ([place])   . Type II or unspecified type diabetes mellitus without mention of complication, not stated as uncontrolled     PROCEDURE: Procedure(s): RIGHT TOTAL KNEE ARTHROPLASTY  on 03/08/2020  CONSULTS: none    HISTORY: Danny [name], 69 y.o. male, has a history of pain and functional disability in the right knee due to osteoarthritis of the right knee and has failed non-surgical conservative treatments for greater than 12 weeks to includeNSAID's and/or analgesics, corticosteriod injections, use of assistive devices and activity modification.  Onset of symptoms was gradual, starting 3 years ago with gradually worsening course since that time. The patient noted no past surgery on the right knee(s).  Patient currently rates pain in the right knee(s) at 7 out of 10 with activity. Patient has night pain, worsening of  pain with activity and weight bearing, pain that interferes with activities of daily living, crepitus and joint swelling.  Patient has evidence of subchondral sclerosis, periarticular osteophytes and joint space narrowing by imaging studies. There is no active infection.   HOSPITAL COURSE:  Danny [name] is a 69 y.o. admitted on 03/08/2020 and found to have a diagnosis of right knee osteoarthritis.  After appropriate laboratory studies were obtained  they were taken to the operating room on 03/08/2020 and underwent  Procedure(s): RIGHT TOTAL KNEE ARTHROPLASTY  .   They were given perioperative antibiotics:  Anti-infectives (From admission, onward)   Start     Dose/Rate Route Frequency Ordered Stop   03/08/20 1330  ceFAZolin (ANCEF) IVPB 2g/100 mL premix     2 g 200 mL/hr over 30 Minutes Intravenous Every 6 hours 03/08/20 1219 03/08/20 2056   03/08/20 0600  ceFAZolin (ANCEF) IVPB 2g/100 mL premix     2 g 200 mL/hr over 30 Minutes Intravenous On call to O.R. 03/08/20 [id number] 03/08/20 0740    .  Tolerated the procedure well.  Placed with a foley intraoperatively.    Toradol was given post op.  POD #1, allowed out of bed to a chair.  PT for ambulation and exercise program.  Foley D/C'd in morning.  IV saline locked.  O2 discontionued.  POD #2, continued PT and ambulation.  Plan for SNF since wife can not provide care at home with change in Parkinson  POD #3, continued PT.  Plan D/C to SNF since Covid testing is negative and bed is available at [facility] . The remainder of the hospital course was dedicated to ambulation and strengthening.   The patient was discharged on 3 Days Post-Op in  Stable condition.  Blood products given:none  DIAGNOSTIC STUDIES: Recent vital signs:  Patient Vitals for the past 24 hrs:  BP Temp Temp src Pulse Resp SpO2  03/11/20 0919 104/66 -- -- -- -- --  03/11/20 0608 134/78 99.1 F (37.3 C) Oral 89 18 97 %  03/11/20 0330 -- 99.2 F (37.3 C) Oral -- -- --   03/10/20 2241 113/65 100.3 F (37.9 C) Oral 100 19 98 %  03/10/20 1328 101/63 -- -- 97 -- --  03/10/20 1323 (!) 152/129 98 F (36.7 C) -- 100 14 99 %       Recent laboratory studies: Recent Labs    03/09/20 0348 03/10/20 0305 03/11/20 0329  WBC 14.9* 12.5* 12.9*  HGB 11.7* 13.4 12.5*  HCT 36.9* 41.8 39.1  PLT 170 171 168   Recent Labs    03/09/20 0348 03/10/20 0305 03/11/20 0329  NA 137 137 135  K 4.2 4.7 4.2  CL 108 104 103  CO2 21* 23 22  BUN 25* 17 25*  CREATININE 0.96 0.93 1.03  GLUCOSE 162* 152* 140*  CALCIUM 8.8* 8.9 8.8*   Lab Results  Component Value Date   INR 1.0 03/01/2020     Recent Radiographic Studies :  No results found.  DISCHARGE INSTRUCTIONS: Discharge Instructions    CPM   Complete by: As directed    Continuous passive motion machine (CPM):      Use the CPM from 0 to 60 for 6-8 hours per day.      You may increase by 5-10 degrees per day.  You may break it up into 2 or 3 sessions per day.      Use CPM for 3-4  weeks or until you are told to stop.   Call MD / Call 911   Complete by: As directed    If you experience chest pain or shortness of breath, CALL 911 and be transported to the hospital emergency room.  If you develope a fever above 101 F, pus (white drainage) or increased drainage or redness at the wound, or calf pain, call your surgeon's office.   Change dressing   Complete by: As directed    DO NOT CHANGE YOUR DRESSING   Constipation Prevention   Complete by: As directed    Drink plenty of fluids.  Prune juice may be helpful.  You may use a stool softener, such as Colace (over the counter) 100 mg twice a day.  Use MiraLax (over the counter) for constipation as needed.   Diet Carb Modified   Complete by: As directed    Discharge instructions   Complete by: As directed    INSTRUCTIONS AFTER JOINT REPLACEMENT   Remove items at home which could result in a fall. This includes throw rugs or furniture in walking pathways ICE to the  affected joint every three hours while awake for 30 minutes at a time, for at least the first 3-5 days, and then as needed for pain and swelling.  Continue to use ice for pain and swelling. You may notice swelling that will progress down to  the foot and ankle.  This is normal after surgery.  Elevate your leg when you are not up walking on it.   Continue to use the breathing machine you got in the hospital (incentive spirometer) which will help keep your temperature down.  It is common for your temperature to cycle up and down following surgery, especially at night when you are not up moving around and exerting yourself.  The breathing machine keeps your lungs expanded and your temperature down.   DIET:  As you were doing prior to hospitalization, we recommend a well-balanced diet.  DRESSING / WOUND CARE / SHOWERING  Keep the surgical dressing until follow up.  The dressing is water proof, so you can shower without any extra covering.  IF THE DRESSING FALLS OFF or the wound gets wet inside, change the dressing with sterile gauze.  Please use good hand washing techniques before changing the dressing.  Do not use any lotions or creams on the incision until instructed by your surgeon.    ACTIVITY  Increase activity slowly as tolerated, but follow the weight bearing instructions below.   No driving for 6 weeks or until further direction given by your physician.  You cannot drive while taking narcotics.  No lifting or carrying greater than 10 lbs. until further directed by your surgeon. Avoid periods of inactivity such as sitting longer than an hour when not asleep. This helps prevent blood clots.  You may return to work once you are authorized by your doctor.     WEIGHT BEARING   Partial weight bearing with assist device as directed.  50%   EXERCISES  Results after joint replacement surgery are often greatly improved when you follow the exercise, range of motion and muscle strengthening  exercises prescribed by your doctor. Safety measures are also important to protect the joint from further injury. Any time any of these exercises cause you to have increased pain or swelling, decrease what you are doing until you are comfortable again and then slowly increase them. If you have problems or questions, call your caregiver or physical therapist for advice.   Rehabilitation is important following a joint replacement. After just a few days of immobilization, the muscles of the leg can become weakened and shrink (atrophy).  These exercises are designed to build up the tone and strength of the thigh and leg muscles and to improve motion. Often times heat used for twenty to thirty minutes before working out will loosen up your tissues and help with improving the range of motion but do not use heat for the first two weeks following surgery (sometimes heat can increase post-operative swelling).   These exercises can be done on a training (exercise) mat, on the floor, on a table or on a bed. Use whatever works the best and is most comfortable for you.    Use music or television while you are exercising so that the exercises are a pleasant break in your day. This will make your life better with the exercises acting as a break in your routine that you can look forward to.   Perform all exercises about fifteen times, three times per day or as directed.  You should exercise both the operative leg and the other leg as well.   Exercises include:  Quad Sets - Tighten up the muscle on the front of the thigh (Quad) and hold for 5-10 seconds.   Straight Leg Raises - With your knee straight (if you were given a brace,  keep it on), lift the leg to 60 degrees, hold for 3 seconds, and slowly lower the leg.  Perform this exercise against resistance later as your leg gets stronger.  Leg Slides: Lying on your back, slowly slide your foot toward your buttocks, bending your knee up off the floor (only go as far as is  comfortable). Then slowly slide your foot back down until your leg is flat on the floor again.  Angel Wings: Lying on your back spread your legs to the side as far apart as you can without causing discomfort.  Hamstring Strength:  Lying on your back, push your heel against the floor with your leg straight by tightening up the muscles of your buttocks.  Repeat, but this time bend your knee to a comfortable angle, and push your heel against the floor.  You may put a pillow under the heel to make it more comfortable if necessary.   A rehabilitation program following joint replacement surgery can speed recovery and prevent re-injury in the future due to weakened muscles. Contact your doctor or a physical therapist for more information on knee rehabilitation.    CONSTIPATION  Constipation is defined medically as fewer than three stools per week and severe constipation as less than one stool per week.  Even if you have a regular bowel pattern at home, your normal regimen is likely to be disrupted due to multiple reasons following surgery.  Combination of anesthesia, postoperative narcotics, change in appetite and fluid intake all can affect your bowels.   YOU MUST use at least one of the following options; they are listed in order of increasing strength to get the job done.  They are all available over the counter, and you may need to use some, POSSIBLY even all of these options:    Drink plenty of fluids (prune juice may be helpful) and high fiber foods Colace 100 mg by mouth twice a day  Senokot for constipation as directed and as needed Dulcolax (bisacodyl), take with full glass of water  Miralax (polyethylene glycol) once or twice a day as needed.  If you have tried all these things and are unable to have a bowel movement in the first 3-4 days after surgery call either your surgeon or your primary doctor.    If you experience loose stools or diarrhea, hold the medications until you stool forms back  up.  If your symptoms do not get better within 1 week or if they get worse, check with your doctor.  If you experience "the worst abdominal pain ever" or develop nausea or vomiting, please contact the office immediately for further recommendations for treatment.   ITCHING:  If you experience itching with your medications, try taking only a single pain pill, or even half a pain pill at a time.  You can also use Benadryl over the counter for itching or also to help with sleep.   TED HOSE STOCKINGS:  Use stockings on both legs until for at least 2 weeks or as directed by physician office. They may be removed at night for sleeping.  MEDICATIONS:  See your medication summary on the "After Visit Summary" that nursing will review with you.  You may have some home medications which will be placed on hold until you complete the course of blood thinner medication.  It is important for you to complete the blood thinner medication as prescribed.  PRECAUTIONS:  If you experience chest pain or shortness of breath - call 911 immediately for  transfer to the hospital emergency department.   If you develop a fever greater that 101 F, purulent drainage from wound, increased redness or drainage from wound, foul odor from the wound/dressing, or calf pain - CONTACT YOUR SURGEON.                                                   FOLLOW-UP APPOINTMENTS:  If you do not already have a post-op appointment, please call the office for an appointment to be seen by your surgeon.  Guidelines for how soon to be seen are listed in your "After Visit Summary", but are typically between 1-4 weeks after surgery.  OTHER INSTRUCTIONS:   Knee Replacement:  Do not place pillow under knee, focus on keeping the knee straight while resting. CPM instructions: 0-90 degrees, 2 hours in the morning, 2 hours in the afternoon, and 2 hours in the evening. Place foam block, curve side up under heel at all times except when in CPM or when walking.  DO  NOT modify, tear, cut, or change the foam block in any way.  MAKE SURE YOU:  Understand these instructions.  Get help right away if you are not doing well or get worse.    Thank you for letting us be a part of your medical care team.  It is a privilege we respect greatly.  We hope these instructions will help you stay on track for a fast and full recovery!   Do not put a pillow under the knee. Place it under the heel.   Complete by: As directed    Driving restrictions   Complete by: As directed    No driving for 6 weeks   Increase activity slowly as tolerated   Complete by: As directed    Lifting restrictions   Complete by: As directed    No lifting for 6 weeks   Patient may shower   Complete by: As directed    You may shower over the brown dressing   TED hose   Complete by: As directed    Use stockings (TED hose) for 2-3 weeks on right leg.  You may remove them at night for sleeping.   Weight bearing as tolerated   Complete by: As directed       DISCHARGE MEDICATIONS:   Allergies as of 03/11/2020      Reactions   Codeine Nausea And Vomiting      Medication List    STOP taking these medications   aspirin 81 MG tablet Replaced by: aspirin 81 MG chewable tablet   glucose blood test strip   sitaGLIPtin-metformin 50-1000 MG tablet Commonly known as: Janumet     TAKE these medications   aspirin 81 MG chewable tablet Chew 1 tablet (81 mg total) by mouth 2 (two) times daily. Replaces: aspirin 81 MG tablet   carbidopa-levodopa 25-100 MG tablet Commonly known as: SINEMET IR Take 1 tablet by mouth 5 (five) times daily.   Cinnamon 500 MG capsule Take 500 mg by mouth daily.   enalapril 2.5 MG tablet Commonly known as: VASOTEC Take 1 tablet by mouth  daily What changed:   how much to take  how to take this  when to take this  additional instructions   famotidine 20 MG tablet Commonly known as: PEPCID Take 20 mg by mouth in the morning  and at bedtime.    Ginger (Zingiber officinalis) 550 MG Caps Take 550 mg by mouth daily.   glipiZIDE 10 MG 24 hr tablet Commonly known as: GLUCOTROL XL TAKE 1 TABLET BY MOUTH DAILY What changed:   how much to take  how to take this  when to take this  additional instructions   GLUCOSAMINE CHONDR 1500 COMPLX PO Take 1,500 mg by mouth daily.   ketoconazole 2 % cream Commonly known as: NIZORAL Apply 1 application topically 2 (two) times daily.   linagliptin 5 MG Tabs tablet Commonly known as: TRADJENTA Take 1 tablet (5 mg total) by mouth daily. Start taking on: March 12, 2020   metFORMIN 1000 MG tablet Commonly known as: GLUCOPHAGE Take 1 tablet (1,000 mg total) by mouth 2 (two) times daily with a meal. What changed: when to take this   methocarbamol 500 MG tablet Commonly known as: ROBAXIN Take 1 tablet (500 mg total) by mouth every 8 (eight) hours as needed for muscle spasms.   multivitamin tablet Take 1 tablet by mouth daily.   oxyCODONE 5 MG immediate release tablet Commonly known as: Oxy IR/ROXICODONE Take 1 tablet (5 mg total) by mouth every 4 (four) hours as needed for moderate pain (pain score 4-6).   pramipexole 0.5 MG tablet Commonly known as: MIRAPEX Take 1 tablet (0.5 mg total) by mouth 3 (three) times daily.   simvastatin 40 MG tablet Commonly known as: ZOCOR TAKE 0.5 TABLETS (20 MG TOTAL) BY MOUTH DAILY. What changed: when to take this   VITAMIN C ADULT GUMMIES PO Take 2 tablets by mouth daily.            Durable Medical Equipment  (From admission, onward)         Start     Ordered   03/08/20 1219  DME Walker rolling  Once    Question:  Patient needs a walker to treat with the following condition  Answer:  S/P total knee arthroplasty, right   03/08/20 1219   03/08/20 1219  DME 3 n 1  Once     03/08/20 1219   03/08/20 1219  DME Bedside commode  Once    Question:  Patient needs a bedside commode to treat with the following condition  Answer:  Status  post revision of total knee replacement, right   03/08/20 1219           Discharge Care Instructions  (From admission, onward)         Start     Ordered   03/11/20 0000  Change dressing    Comments: DO NOT CHANGE YOUR DRESSING   03/11/20 1039   03/11/20 0000  Weight bearing as tolerated     03/11/20 1039          FOLLOW UP VISIT:   Follow-up Information    Home[facility] Follow up.   Specialty: [facility] Contact information: [place] [address] [phone]           DISPOSITION:   Skilled Nursing Facility/Rehab  CONDITION:  Stable   Mike Craze. [place], [place] [phone]  03/11/2020 10:45 AM

## 2020-03-11 NOTE — Progress Notes (Signed)
Report called to [facility].  

## 2020-03-11 NOTE — Progress Notes (Signed)
Physical Therapy Treatment Patient Details Name: Danny [name] MRN: [id number] DOB: 1951-11-08 Today's Date: 03/11/2020    History of Present Illness Patient is 69 y.o. male s/p Rt TKA on 03/08/20 with PMH significant for DM, Parkinsons, OA, GERD, HTN, HLD.    PT Comments    POD # 3 am session Pt still unable to perform SLR.  Assisted OOB to amb required + 2 assist for safety.  General bed mobility comments: required increased assist this session as pt was unable to self move R LE and had difficulty self scooting and pulling up his upper body.  Once upright, pt required increased time to self adjust to upright and VC's to place "both feet on the floor". General transfer comment: from elevated bed, pt was able to self rise to standing with tendency to pull up on walker which Thetrapist secured as he was unable to "coordinate" B UE's to push up from bed.  Then required 75% VC's to "reach back" back to sit as Therapist assisted supporting R LE. General Gait Details: this session applied pt's personal shoes and did NOT use KI.  Pt was able to self weight shift and advance B LE but therapsit needed to advance walker in correct sequence.  Parkinson's gait pattern improving slowly.   Spouse assisted by following with recliner.  Pt tolerated distance but c/o B "arms are getting tired".  Returned to room in recliner and performed some TKR TE's followed by ICE.     Follow Up Recommendations  SNF     Equipment Recommendations       Recommendations for Other Services       Precautions / Restrictions Precautions Precautions: Fall;Knee Precaution Comments: Hx Parkinsons Restrictions Weight Bearing Restrictions: No    Mobility  Bed Mobility Overal bed mobility: Needs Assistance Bed Mobility: Supine to Sit     Supine to sit: Mod assist;+2 for physical assistance;+2 for safety/equipment     General bed mobility comments: required increased assist this session as pt was unable to self move R  LE and had difficulty self scooting and pulling up his upper body.  Once upright, pt required increased time to self adjust to upright and VC's to place "both feet on the floor".  Transfers Overall transfer level: Needs assistance Equipment used: Rolling walker (2 wheeled) Transfers: Sit to/from Stand Sit to Stand: Min assist;+2 physical assistance;+2 safety/equipment         General transfer comment: from elevated bed, pt was able to self rise to standing with tendency to pull up on walker which Thetrapist secured as he was unable to "coordinate" B UE's to push up from bed.  Then required 75% VC's to "reach back" back to sit as Therapist assisted supporting R LE.  Ambulation/Gait Ambulation/Gait assistance: Min guard;Min assist;+2 safety/equipment Gait Distance (Feet): 18 Feet Assistive device: Rolling walker (2 wheeled) Gait Pattern/deviations: Antalgic;Decreased weight shift to right;Step-to pattern Gait velocity: decreased   General Gait Details: this session applied pt's personal shoes and did NOT use KI.  Pt was able to self weight shift and advance B LE but therapsit needed to advance walker in correct sequence.  Spouse assisted by following with recliner.  Pt tolerated distance but c/o B "arms are getting tired".   Stairs             Wheelchair Mobility    Modified Rankin (Stroke Patients Only)       Balance  Cognition Arousal/Alertness: Awake/alert Behavior During Therapy: WFL for tasks assessed/performed Overall Cognitive Status: Within Functional Limits for tasks assessed                                 General Comments: AxO x 3 following all directions      Exercises   20 reps AP 10 reps of knee presses   5 reps HS AAROM approx 10 - 55 degrees limited by pain and tightness   General Comments        Pertinent Vitals/Pain Pain Assessment: 0-10 Faces Pain Scale: Hurts  little more Pain Location: Rt knee with activity Pain Descriptors / Indicators: Aching;Discomfort;Sore;Operative site guarding;Grimacing Pain Intervention(s): Monitored during session;Repositioned;Ice applied    Home Living                      Prior Function            PT Goals (current goals can now be found in the care plan section) Progress towards PT goals: Progressing toward goals    Frequency    7X/week      PT Plan Current plan remains appropriate    Co-evaluation              AM-PAC PT "6 Clicks" Mobility   Outcome Measure  Help needed turning from your back to your side while in a flat bed without using bedrails?: A Lot Help needed moving from lying on your back to sitting on the side of a flat bed without using bedrails?: A Lot Help needed moving to and from a bed to a chair (including a wheelchair)?: A Lot Help needed standing up from a chair using your arms (e.g., wheelchair or bedside chair)?: A Lot Help needed to walk in hospital room?: A Lot Help needed climbing 3-5 steps with a railing? : Total 6 Click Score: 11    End of Session Equipment Utilized During Treatment: Gait belt Activity Tolerance: Patient limited by fatigue;Patient limited by pain Patient left: in chair;with call bell/phone within reach;with family/visitor present Nurse Communication: Mobility status PT Visit Diagnosis: Muscle weakness (generalized) (M62.81);Other abnormalities of gait and mobility (R26.89)     Time: 1115-1140 PT Time Calculation (min) (ACUTE ONLY): 25 min  Charges:  $Gait Training: 8-22 mins $Therapeutic Exercise: 8-22 mins                     Rica Koyanagi  PTA Acute  Rehabilitation Services Pager      [phone] Office      [phone]

## 2020-03-14 ENCOUNTER — Telehealth: Payer: Self-pay | Admitting: Orthopaedic Surgery

## 2020-03-14 NOTE — Telephone Encounter (Signed)
Kelly from [facility] called. They have a few questions. Would like for someone to call. [phone]

## 2020-03-14 NOTE — Telephone Encounter (Signed)
Spoke with [employer]. She wanted to confirm that Dr.Whitfield only wanted patient taking 81mg  Aspirin twice daily. Spoke with Dr.Whitfield and he confirmed that was correct. I relayed this to [facility] in another phone call.

## 2020-03-22 ENCOUNTER — Ambulatory Visit (INDEPENDENT_AMBULATORY_CARE_PROVIDER_SITE_OTHER): Payer: Medicare Other

## 2020-03-22 ENCOUNTER — Other Ambulatory Visit: Payer: Self-pay

## 2020-03-22 ENCOUNTER — Ambulatory Visit (INDEPENDENT_AMBULATORY_CARE_PROVIDER_SITE_OTHER): Payer: Medicare Other | Admitting: Orthopaedic Surgery

## 2020-03-22 ENCOUNTER — Encounter: Payer: Self-pay | Admitting: Orthopaedic Surgery

## 2020-03-22 VITALS — Ht 70.0 in | Wt 225.0 lb

## 2020-03-22 DIAGNOSIS — G20 Parkinson's disease: Secondary | ICD-10-CM

## 2020-03-22 DIAGNOSIS — Z96651 Presence of right artificial knee joint: Secondary | ICD-10-CM

## 2020-03-22 DIAGNOSIS — M1711 Unilateral primary osteoarthritis, right knee: Secondary | ICD-10-CM

## 2020-03-22 NOTE — Progress Notes (Signed)
Office Visit Note   Patient: Danny [name]           Date of Birth: [birth year]           MRN: [id number] Visit Date: 03/22/2020              Requested by: [name], [address] [place],  [place] [address] PCP: [name], PA   Assessment & Plan: Visit Diagnoses:  1. S/P total knee arthroplasty, right   2. Unilateral primary osteoarthritis, right knee   3. Parkinson disease ([place])   4. Total knee replacement status, right     Plan: 2 weeks status post right total knee replacement doing well.  Presently staying at a rehab facility because of his Parkinson's.  Slow postop progression.  Not having much pain.  Lacks about 10 degrees to full knee extension and flexed over 90 degrees.  The wound is healing without a problem.  I remove the clips and applied Steri-Strips over benzoin.  Stop 81 mg aspirin twice a day and return to his routine of 81 mg daily.  Return to the office in 2 weeks.  Continued with weightbearing as tolerated rest of range of motion at the rehab facility.  Return home when he has reached the point in therapy where his wife can assist as she feels that his Parkinson's is a little worse since surgery and she is concerned that she cannot care for him appropriately at home  Follow-Up Instructions: Return in about 2 weeks (around 04/05/2020).   Orders:  Orders Placed This Encounter  Procedures  . XR KNEE 3 VIEW RIGHT   No orders of the defined types were placed in this encounter.     Procedures: No procedures performed   Clinical Data: No additional findings.   Subjective: Chief Complaint  Patient presents with  . Right Knee - Follow-up    Right total knee arthroplasty DOS 03/08/2020  Patient presents today for follow up on his right knee. He had a right total knee arthroplasty on 03/08/2020. He is now two weeks out from surgery.  Patient is staying in a facility and states that he does not need pain medicine.   HPI  Review  of Systems   Objective: Vital Signs: Ht 5\' 10"  (1.778 m)   Wt 225 lb (102.1 kg)   BMI 32.28 kg/m   Physical Exam  Ortho Exam awake alert and oriented x3.  Comfortable sitting.  Speech is little difficult to understand because of his Parkinson's but he relates he is doing well.  Not having much pain.  Right knee incision is healing without a problem.  The clips removed and Steri-Strips applied over benzoin.  I could flex about 95 degrees and lacked only about 10 degrees to full extension.  No instability or opening with varus valgus stress.  Motor exam intact good sensation.  Specialty Comments:  No specialty comments available.  Imaging: No results found.   PMFS History: Patient Active Problem List   Diagnosis Date Noted  . Total knee replacement status, right 03/22/2020  . Hematospermia 11/18/2015  . Epididymal cyst 05/25/2015  . Parkinson disease ([place]) 01/14/2014  . Obesity (BMI 30-39.9) 07/14/2013  . Confusional arousals 02/13/2013  . Disorder of ligament of right wrist 01/29/2012  . Diabetes mellitus type 2, uncomplicated ([place]) 99991111  . Mixed hyperlipidemia 10/09/2011  . Unilateral primary osteoarthritis, right knee 10/09/2011  . Essential hypertension, benign 10/09/2011   Past Medical History:  Diagnosis Date  .  Allergy    SEASONAL  . Cataract    BILATERAL-REMOVED  . Essential hypertension, benign    PT.DENIES ON AS PREVENTIVE 08/17/19  . GERD (gastroesophageal reflux disease)   . Hx of adenomatous polyp of colon 09/04/2019   08/2019 diminutive adenoma No recall given findings and age  . Mixed hyperlipidemia    PT.DENIES,STATED ON AS PREVENTIVE 07/30/19  . Neuromuscular disorder (HCC)    PARKINSONS  . OA (osteoarthritis) of knee    right  . Obesity, unspecified   . Osteoarthritis    KNEE AND SHOULDERS  . Parkinsons ([place])   . Type II or unspecified type diabetes mellitus without mention of complication, not stated as uncontrolled     Family History    Problem Relation Age of Onset  . Diabetes Sister   . Diabetes Daughter   . Cancer Father   . Heart disease Father   . Colon polyps Son   . Diabetes Mother   . Lung cancer Mother   . Colon cancer Neg Hx   . Esophageal cancer Neg Hx   . Rectal cancer Neg Hx   . Stomach cancer Neg Hx     Past Surgical History:  Procedure Laterality Date  . CATARACT EXTRACTION, BILATERAL    . GANGLION CYST EXCISION  age 69 years   right  . KNEE ARTHROSCOPY     right  . KNEE ARTHROSCOPY    . TOTAL KNEE ARTHROPLASTY Right 03/08/2020   Procedure: RIGHT TOTAL KNEE ARTHROPLASTY;  Surgeon: Garald Balding, MD;  Location: [facility];  Service: Orthopedics;  Laterality: Right;  . WRIST FRACTURE SURGERY    . WRIST SURGERY  04/2012   Social History   Occupational History  . Occupation: Chief Financial Officer  Tobacco Use  . Smoking status: Former Smoker    Packs/day: 1.50    Years: 15.00    Pack years: 22.50    Types: Cigarettes    Quit date: 12/17/1982    Years since quitting: 37.2  . Smokeless tobacco: Never Used  . Tobacco comment: over 40 yrs  Substance and Sexual Activity  . Alcohol use: Yes    Comment: very rare  . Drug use: No  . Sexual activity: Yes    Partners: Female

## 2020-03-23 ENCOUNTER — Telehealth: Payer: Self-pay | Admitting: Orthopaedic Surgery

## 2020-03-23 NOTE — Telephone Encounter (Signed)
Received call from Uehling from [facility] needing to know if patient can stop using CPM machine? Claiborne Billings advised patient is over 100% on it. The number to contact patient is [phone]

## 2020-03-23 NOTE — Telephone Encounter (Signed)
Ok to stop CPM-continue with aggressive ROM

## 2020-03-23 NOTE — Telephone Encounter (Signed)
Spoke with Claiborne Billings at [facility] and relayed information from Dr.Whitfield.

## 2020-03-23 NOTE — Telephone Encounter (Signed)
Patient is now two weeks out from a right total knee arthroplasty. Please advise.

## 2020-03-29 ENCOUNTER — Telehealth: Payer: Self-pay | Admitting: Orthopaedic Surgery

## 2020-03-29 NOTE — Telephone Encounter (Signed)
Please advise 

## 2020-03-29 NOTE — Telephone Encounter (Signed)
Danny [name] from [facility] called to request VO for Occupational Therapy for 1x a week for 8 weeks.  CB#[phone].  Thank you

## 2020-03-29 NOTE — Telephone Encounter (Signed)
Called and gave verbal orders as directed.

## 2020-03-29 NOTE — Telephone Encounter (Signed)
OK 

## 2020-04-05 ENCOUNTER — Ambulatory Visit (INDEPENDENT_AMBULATORY_CARE_PROVIDER_SITE_OTHER): Payer: Medicare Other | Admitting: Orthopaedic Surgery

## 2020-04-05 ENCOUNTER — Encounter: Payer: Self-pay | Admitting: Orthopaedic Surgery

## 2020-04-05 ENCOUNTER — Other Ambulatory Visit: Payer: Self-pay

## 2020-04-05 VITALS — Ht 70.0 in | Wt 225.0 lb

## 2020-04-05 DIAGNOSIS — Z96651 Presence of right artificial knee joint: Secondary | ICD-10-CM

## 2020-04-05 DIAGNOSIS — M1711 Unilateral primary osteoarthritis, right knee: Secondary | ICD-10-CM

## 2020-04-05 NOTE — Progress Notes (Signed)
Office Visit Note   Patient: Danny [name]           Date of Birth: Feb 15, 1951           MRN: [id number] Visit Date: 04/05/2020              Requested by: [name], [address] [place],  [place] [address] PCP: [name], PA   Assessment & Plan: Visit Diagnoses:  1. Unilateral primary osteoarthritis, right knee   2. Total knee replacement status, right     Plan: 1 month status post primary right total knee replacement doing quite well.  Uses a rolling walker.  Not having any pain.  Continues to participate in physical therapy.  Parkinson's is less active.  Return in 1 month and continue working on strengthening  Follow-Up Instructions: Return in about 1 month (around 05/05/2020).   Orders:  No orders of the defined types were placed in this encounter.  No orders of the defined types were placed in this encounter.     Procedures: No procedures performed   Clinical Data: No additional findings.   Subjective: Chief Complaint  Patient presents with  . Right Knee - Follow-up    Right total knee arthroplasty DOS 03/08/2020  Patient presents today for follow up on his right knee. He is now 4 weeks out from a right total knee arthroplasty. His surgery was done on 03/08/2020. He is ambulating much better today. He has been home for one week now. He is doing home therapy twice weekly. He is not taking anything for pain. No complaints.  HPI  Review of Systems   Objective: Vital Signs: Ht 5\' 10"  (1.778 m)   Wt 225 lb (102.1 kg)   BMI 32.28 kg/m   Physical Exam  Ortho Exam awake alert and oriented x3.  Is comfortable sitting and walking.  Uses a rolling walker.  Just about full right knee extension and flexed over 100 degrees.  No opening with varus valgus stress and negative anterior drawer sign.  Incision is healed without any problems.  No calf pain.  Does have some swelling of his right ankle that may be a little bit more than he had  preoperatively according to his wife.  Good capillary refill to toes  Specialty Comments:  No specialty comments available.  Imaging: No results found.   PMFS History: Patient Active Problem List   Diagnosis Date Noted  . Total knee replacement status, right 03/22/2020  . Hematospermia 11/18/2015  . Epididymal cyst 05/25/2015  . Parkinson disease ([place]) 01/14/2014  . Obesity (BMI 30-39.9) 07/14/2013  . Confusional arousals 02/13/2013  . Disorder of ligament of right wrist 01/29/2012  . Diabetes mellitus type 2, uncomplicated ([place]) 99991111  . Mixed hyperlipidemia 10/09/2011  . Unilateral primary osteoarthritis, right knee 10/09/2011  . Essential hypertension, benign 10/09/2011   Past Medical History:  Diagnosis Date  . Allergy    SEASONAL  . Cataract    BILATERAL-REMOVED  . Essential hypertension, benign    PT.DENIES ON AS PREVENTIVE 08/17/19  . GERD (gastroesophageal reflux disease)   . Hx of adenomatous polyp of colon 09/04/2019   08/2019 diminutive adenoma No recall given findings and age  . Mixed hyperlipidemia    PT.DENIES,STATED ON AS PREVENTIVE 07/30/19  . Neuromuscular disorder (HCC)    PARKINSONS  . OA (osteoarthritis) of knee    right  . Obesity, unspecified   . Osteoarthritis    KNEE AND SHOULDERS  .  Parkinsons ([place])   . Type II or unspecified type diabetes mellitus without mention of complication, not stated as uncontrolled     Family History  Problem Relation Age of Onset  . Diabetes Sister   . Diabetes Daughter   . Cancer Father   . Heart disease Father   . Colon polyps Son   . Diabetes Mother   . Lung cancer Mother   . Colon cancer Neg Hx   . Esophageal cancer Neg Hx   . Rectal cancer Neg Hx   . Stomach cancer Neg Hx     Past Surgical History:  Procedure Laterality Date  . CATARACT EXTRACTION, BILATERAL    . GANGLION CYST EXCISION  age 63 years   right  . KNEE ARTHROSCOPY     right  . KNEE ARTHROSCOPY    . TOTAL KNEE ARTHROPLASTY Right  03/08/2020   Procedure: RIGHT TOTAL KNEE ARTHROPLASTY;  Surgeon: Garald Balding, MD;  Location: [facility];  Service: Orthopedics;  Laterality: Right;  . WRIST FRACTURE SURGERY    . WRIST SURGERY  04/2012   Social History   Occupational History  . Occupation: Chief Financial Officer  Tobacco Use  . Smoking status: Former Smoker    Packs/day: 1.50    Years: 15.00    Pack years: 22.50    Types: Cigarettes    Quit date: 12/17/1982    Years since quitting: 37.3  . Smokeless tobacco: Never Used  . Tobacco comment: over 40 yrs  Substance and Sexual Activity  . Alcohol use: Yes    Comment: very rare  . Drug use: No  . Sexual activity: Yes    Partners: Female

## 2020-05-10 ENCOUNTER — Ambulatory Visit (INDEPENDENT_AMBULATORY_CARE_PROVIDER_SITE_OTHER): Payer: Medicare Other | Admitting: Orthopaedic Surgery

## 2020-05-10 ENCOUNTER — Other Ambulatory Visit: Payer: Self-pay

## 2020-05-10 ENCOUNTER — Telehealth: Payer: Self-pay | Admitting: Orthopaedic Surgery

## 2020-05-10 ENCOUNTER — Encounter: Payer: Self-pay | Admitting: Orthopaedic Surgery

## 2020-05-10 VITALS — Ht 70.0 in | Wt 225.0 lb

## 2020-05-10 DIAGNOSIS — M1711 Unilateral primary osteoarthritis, right knee: Secondary | ICD-10-CM

## 2020-05-10 DIAGNOSIS — Z96651 Presence of right artificial knee joint: Secondary | ICD-10-CM

## 2020-05-10 NOTE — Progress Notes (Signed)
Office Visit Note   Patient: Danny [name]           Date of Birth: [birth year]           MRN: [id number] Visit Date: 05/10/2020              Requested by: [name], [address] [place],   [id number] PCP: [name], PA   Assessment & Plan: Visit Diagnoses:  1. Unilateral primary osteoarthritis, right knee   2. Total knee replacement status, right     Plan: 2 months status post primary right total knee replacement.  Doing well.  Parkinson's is quite active.  Still uses a rolling walker and probably will on a chronic basis.  Not taking any pain medicine.  Lacks a few degrees to full knee extension but has is similar problem on the left which I suspect is related to his Parkinson's and his stiffness.  Flexed over 120 degrees without instability.  Continue with the strengthening exercises and we will plan to see him back in 3 months.  Patient and wife very happy with his results  Follow-Up Instructions: Return in about 3 months (around 08/10/2020).   Orders:  No orders of the defined types were placed in this encounter.  No orders of the defined types were placed in this encounter.     Procedures: No procedures performed   Clinical Data: No additional findings.   Subjective: Chief Complaint  Patient presents with  . Right Knee - Follow-up    Right TKA DOS 03/08/2020  Patient presents today for follow up on his right knee. He had a right total knee arthroplasty on 03/08/2020. He is now 9 weeks out from surgery. He is doing really well. His Parkinson's is giving him the biggest problem. His home physical therapy just finished last week. He still has one week left for occupational therapy. He is not taking anything for pain.  HPI  Review of Systems   Objective: Vital Signs: Ht 5\' 10"  (1.778 m)   Wt 225 lb (102.1 kg)   BMI 32.28 kg/m   Physical Exam  Ortho Exam right knee incision healing without problem.  I suspect he has had  some reaction to the Monocryl stitches over time as there is a bit of pink discoloration of the distal aspect of the wound but no evidence of any active infection.  Lacks just a few degrees to full extension actively passively I can get him almost straight.  Has stiffness in his other unoperated knee related to his Parkinson's.  Can flex over 120 degrees without instability.  No calf pain.  No distal edema  Specialty Comments:  No specialty comments available.  Imaging: No results found.   PMFS History: Patient Active Problem List   Diagnosis Date Noted  . Total knee replacement status, right 03/22/2020  . Hematospermia 11/18/2015  . Epididymal cyst 05/25/2015  . Parkinson disease ([place]) 01/14/2014  . Obesity (BMI 30-39.9) 07/14/2013  . Confusional arousals 02/13/2013  . Disorder of ligament of right wrist 01/29/2012  . Diabetes mellitus type 2, uncomplicated ([place]) 99991111  . Mixed hyperlipidemia 10/09/2011  . Unilateral primary osteoarthritis, right knee 10/09/2011  . Essential hypertension, benign 10/09/2011   Past Medical History:  Diagnosis Date  . Allergy    SEASONAL  . Cataract    BILATERAL-REMOVED  . Essential hypertension, benign    PT.DENIES ON AS PREVENTIVE 08/17/19  . GERD (gastroesophageal reflux disease)   . Hx of  adenomatous polyp of colon 09/04/2019   08/2019 diminutive adenoma No recall given findings and age  . Mixed hyperlipidemia    PT.DENIES,STATED ON AS PREVENTIVE 07/30/19  . Neuromuscular disorder (HCC)    PARKINSONS  . OA (osteoarthritis) of knee    right  . Obesity, unspecified   . Osteoarthritis    KNEE AND SHOULDERS  . Parkinsons ([place])   . Type II or unspecified type diabetes mellitus without mention of complication, not stated as uncontrolled     Family History  Problem Relation Age of Onset  . Diabetes Sister   . Diabetes Daughter   . Cancer Father   . Heart disease Father   . Colon polyps Son   . Diabetes Mother   . Lung cancer Mother    . Colon cancer Neg Hx   . Esophageal cancer Neg Hx   . Rectal cancer Neg Hx   . Stomach cancer Neg Hx     Past Surgical History:  Procedure Laterality Date  . CATARACT EXTRACTION, BILATERAL    . GANGLION CYST EXCISION  age 69 years   right  . KNEE ARTHROSCOPY     right  . KNEE ARTHROSCOPY    . TOTAL KNEE ARTHROPLASTY Right 03/08/2020   Procedure: RIGHT TOTAL KNEE ARTHROPLASTY;  Surgeon: Garald Balding, MD;  Location: [facility];  Service: Orthopedics;  Laterality: Right;  . WRIST FRACTURE SURGERY    . WRIST SURGERY  04/2012   Social History   Occupational History  . Occupation: Chief Financial Officer  Tobacco Use  . Smoking status: Former Smoker    Packs/day: 1.50    Years: 15.00    Pack years: 22.50    Types: Cigarettes    Quit date: 12/17/1982    Years since quitting: 37.4  . Smokeless tobacco: Never Used  . Tobacco comment: over 40 yrs  Substance and Sexual Activity  . Alcohol use: Yes    Comment: very rare  . Drug use: No  . Sexual activity: Yes    Partners: Female

## 2020-05-10 NOTE — Telephone Encounter (Signed)
Called and left message.

## 2020-05-10 NOTE — Telephone Encounter (Signed)
See below

## 2020-05-10 NOTE — Telephone Encounter (Signed)
[place] with [employer] called stating she needed orders from Dr. Durward Fortes.   [place]# [phone]

## 2020-05-19 ENCOUNTER — Ambulatory Visit
Admission: EM | Admit: 2020-05-19 | Discharge: 2020-05-19 | Disposition: A | Discharge status: Home / Self Care | Payer: Medicare Other | Attending: Emergency Medicine | Admitting: Emergency Medicine

## 2020-05-19 ENCOUNTER — Ambulatory Visit (INDEPENDENT_AMBULATORY_CARE_PROVIDER_SITE_OTHER): Payer: Medicare Other

## 2020-05-19 DIAGNOSIS — S2241XA Multiple fractures of ribs, right side, initial encounter for closed fracture: Secondary | ICD-10-CM

## 2020-05-19 DIAGNOSIS — W108XXA Fall (on) (from) other stairs and steps, initial encounter: Secondary | ICD-10-CM

## 2020-05-19 MED ORDER — TIZANIDINE HCL 2 MG PO TABS: 2.0000 mg | ORAL_TABLET | Freq: Three times a day (TID) | ORAL | 0 refills | Status: DC | PRN

## 2020-05-19 NOTE — Discharge Instructions (Addendum)
Very important to perform incentive spirometry as discussed. May take muscle relaxer and home oxycodone as needed for severe pain. Important to remember that these medications can increase your risk of falling so you should avoid drinking/eating 2-3 hours before bedtime. Very important to follow-up with PCP today via phone. Go to ER for worsening chest pain, difficulty breathing, lightheadedness.

## 2020-05-19 NOTE — ED Triage Notes (Signed)
Pt states fell down 3 brick steps on Tuesday. States having rt rib pain and rt shoulder pain. Pt c/o pain on breathing.

## 2020-05-19 NOTE — ED Provider Notes (Signed)
[facility]    CSN: [id number] Arrival date & time: 05/19/20  1309      History   Chief Complaint Chief Complaint  Patient presents with  . Fall    HPI Danny [name] is a 69 y.o. male with extensive medical history as outlined below including obesity, type 2 diabetes, Parkinson's, hypertension presenting for right anterolateral rib pain.  States he fell Tuesday: Denies head trauma, LOC.  States pain is worse with deep inspiration.  Denies palpitations, shortness of breath, swelling of legs, lightheadedness.  Has tried home oxycodone with some relief.   Past Medical History:  Diagnosis Date  . Allergy    SEASONAL  . Cataract    BILATERAL-REMOVED  . Essential hypertension, benign    PT.DENIES ON AS PREVENTIVE 08/17/19  . GERD (gastroesophageal reflux disease)   . Hx of adenomatous polyp of colon 09/04/2019   08/2019 diminutive adenoma No recall given findings and age  . Mixed hyperlipidemia    PT.DENIES,STATED ON AS PREVENTIVE 07/30/19  . Neuromuscular disorder (HCC)    PARKINSONS  . OA (osteoarthritis) of knee    right  . Obesity, unspecified   . Osteoarthritis    KNEE AND SHOULDERS  . Parkinsons ([place])   . Type II or unspecified type diabetes mellitus without mention of complication, not stated as uncontrolled     Patient Active Problem List   Diagnosis Date Noted  . Total knee replacement status, right 03/22/2020  . Hematospermia 11/18/2015  . Epididymal cyst 05/25/2015  . Parkinson disease ([place]) 01/14/2014  . Obesity (BMI 30-39.9) 07/14/2013  . Confusional arousals 02/13/2013  . Disorder of ligament of right wrist 01/29/2012  . Diabetes mellitus type 2, uncomplicated ([place]) 99991111  . Mixed hyperlipidemia 10/09/2011  . Unilateral primary osteoarthritis, right knee 10/09/2011  . Essential hypertension, benign 10/09/2011    Past Surgical History:  Procedure Laterality Date  . CATARACT EXTRACTION, BILATERAL    . GANGLION CYST EXCISION  age 43  years   right  . KNEE ARTHROSCOPY     right  . KNEE ARTHROSCOPY    . TOTAL KNEE ARTHROPLASTY Right 03/08/2020   Procedure: RIGHT TOTAL KNEE ARTHROPLASTY;  Surgeon: Garald Balding, MD;  Location: [facility];  Service: Orthopedics;  Laterality: Right;  . WRIST FRACTURE SURGERY    . WRIST SURGERY  04/2012       Home Medications    Prior to Admission medications   Medication Sig Start Date End Date Taking? Authorizing Provider  Ascorbic Acid (VITAMIN C ADULT GUMMIES PO) Take 2 tablets by mouth daily.     [provider]  aspirin 81 MG chewable tablet Chew 1 tablet (81 mg total) by mouth 2 (two) times daily. 03/11/20   Cherylann Ratel, PA-C  carbidopa-levodopa (SINEMET IR) 25-100 MG tablet Take 1 tablet by mouth 5 (five) times daily. 06/04/19   [name], MD  Cinnamon 500 MG capsule Take 500 mg by mouth daily.    [provider]  enalapril (VASOTEC) 2.5 MG tablet Take 1 tablet by mouth  daily Patient taking differently: Take 2.5 mg by mouth daily.  05/08/16   Wardell Honour, MD  famotidine (PEPCID) 20 MG tablet Take 20 mg by mouth in the morning and at bedtime.     [provider]  Ginger, Zingiber officinalis, 550 MG CAPS Take 550 mg by mouth daily.      [provider]  glipiZIDE (GLUCOTROL XL) 10 MG 24 hr tablet TAKE 1 TABLET  BY MOUTH DAILY Patient taking differently: Take 10 mg by mouth at bedtime.  10/15/17   [name], PA  Glucosamine-Chondroit-Vit C-Mn (GLUCOSAMINE CHONDR 1500 COMPLX PO) Take 1,500 mg by mouth daily.      [provider]  ketoconazole (NIZORAL) 2 % cream Apply 1 application topically 2 (two) times daily.  08/10/19   [provider]  linagliptin (TRADJENTA) 5 MG TABS tablet Take 1 tablet (5 mg total) by mouth daily. 03/12/20   Cherylann Ratel, PA-C  metFORMIN (GLUCOPHAGE) 1000 MG tablet Take 1 tablet (1,000 mg total) by mouth 2 (two) times daily with a meal. 03/11/20   Petrarca, Mike Craze, PA-C  methocarbamol  (ROBAXIN) 500 MG tablet Take 1 tablet (500 mg total) by mouth every 8 (eight) hours as needed for muscle spasms. Patient not taking: Reported on 05/10/2020 03/11/20   Cherylann Ratel, PA-C  Multiple Vitamin (MULTIVITAMIN) tablet Take 1 tablet by mouth daily.    [provider]  oxyCODONE (OXY IR/ROXICODONE) 5 MG immediate release tablet Take 1 tablet (5 mg total) by mouth every 4 (four) hours as needed for moderate pain (pain score 4-6). Patient not taking: Reported on 05/10/2020 03/11/20   Cherylann Ratel, PA-C  pramipexole (MIRAPEX) 0.5 MG tablet Take 1 tablet (0.5 mg total) by mouth 3 (three) times daily. 10/05/19   [name], MD  simvastatin (ZOCOR) 40 MG tablet TAKE 0.5 TABLETS (20 MG TOTAL) BY MOUTH DAILY. Patient taking differently: Take 20 mg by mouth every other day.  02/06/18   [name], PA  tiZANidine (ZANAFLEX) 2 MG tablet Take 1 tablet (2 mg total) by mouth every 8 (eight) hours as needed for up to 5 days for muscle spasms. 05/19/20 05/24/20  Hall-Potvin, [place], PA-C    Family History Family History  Problem Relation Age of Onset  . Diabetes Sister   . Diabetes Daughter   . Cancer Father   . Heart disease Father   . Colon polyps Son   . Diabetes Mother   . Lung cancer Mother   . Colon cancer Neg Hx   . Esophageal cancer Neg Hx   . Rectal cancer Neg Hx   . Stomach cancer Neg Hx     Social History Social History   Tobacco Use  . Smoking status: Former Smoker    Packs/day: 1.50    Years: 15.00    Pack years: 22.50    Types: Cigarettes    Quit date: 12/17/1982    Years since quitting: 37.4  . Smokeless tobacco: Never Used  . Tobacco comment: over 40 yrs  Substance Use Topics  . Alcohol use: Yes    Comment: very rare  . Drug use: No     Allergies   Codeine   Review of Systems As per HPI   Physical Exam Triage Vital Signs ED Triage Vitals  Enc Vitals Group     BP      Pulse      Resp      Temp      Temp src      SpO2      Weight        Height      Head Circumference      Peak Flow      Pain Score      Pain Loc      Pain Edu?      Excl. in [place]?    No data found.  Updated Vital Signs BP 124/88 (BP  Location: Right Arm)   Pulse 84   Temp 98.2 F (36.8 C) (Oral)   Resp 16   SpO2 93%   Visual Acuity Right Eye Distance:   Left Eye Distance:   Bilateral Distance:    Right Eye Near:   Left Eye Near:    Bilateral Near:     Physical Exam Constitutional:      General: He is not in acute distress. HENT:     Head: Normocephalic and atraumatic.  Eyes:     General: No scleral icterus.    Pupils: Pupils are equal, round, and reactive to light.  Cardiovascular:     Rate and Rhythm: Normal rate.  Pulmonary:     Effort: Pulmonary effort is normal. No respiratory distress.     Breath sounds: No wheezing.  Skin:    Coloration: Skin is not jaundiced or pale.  Neurological:     Mental Status: He is alert and oriented to person, place, and time.      UC Treatments / Results  Labs (all labs ordered are listed, but only abnormal results are displayed) Labs Reviewed - No data to display  EKG   Radiology DG Ribs Unilateral W/Chest Right  Result Date: 05/19/2020 CLINICAL DATA:  Right anterior lower rib pain after fall 2 days ago. Fell down break steps. EXAM: RIGHT RIBS AND CHEST - 3+ VIEW COMPARISON:  Radiograph 07/24/2016. FINDINGS: Acute mildly displaced fractures of right anterolateral sixth, seventh, and eighth ribs. Minimal pleural thickening and atelectasis at the right lung base. There is no evidence of pneumothorax. Heart size and mediastinal contours are within normal limits. IMPRESSION: Acute mildly displaced fractures of right anterolateral sixth, seventh, and eighth ribs. Associated pleural thickening with atelectasis at the right lung base. No evidence of pneumothorax. Electronically Signed   By: Keith Rake M.D.   On: 05/19/2020 14:46    Procedures Procedures (including critical care  time)  Medications Ordered in UC Medications - No data to display  Initial Impression / Assessment and Plan / UC Course  I have reviewed the triage vital signs and the nursing notes.  Pertinent labs & imaging results that were available during my care of the patient were reviewed by me and considered in my medical decision making (see chart for details).     Patient febrile, nontoxic, and hemodynamically stable.  Right ribs with chest x-ray done office, reviewed by me radiology.  Compared to previous from 07/24/2016: Significant for acute mildly displaced fractures of right anterolateral 6-8 ribs.  No pneumothorax noted.  Reviewed findings with patient wife who verbalized understanding.  Clinical staff provided teaching on incentive spirometry.  Patient will call PCP to schedule follow-up.  Will provide low-dose muscle relaxer as adjuvant therapy.  Reviewed at length risk/benefit of muscle relaxer, narcotic in the setting: Patient and wife verbalized understanding.  Return precautions discussed, patient and wife verbalized understanding and are agreeable to plan. Final Clinical Impressions(s) / UC Diagnoses   Final diagnoses:  Closed fracture of three ribs of right side, initial encounter     Discharge Instructions     Very important to perform incentive spirometry as discussed. May take muscle relaxer and home oxycodone as needed for severe pain. Important to remember that these medications can increase your risk of falling so you should avoid drinking/eating 2-3 hours before bedtime. Very important to follow-up with PCP today via phone. Go to ER for worsening chest pain, difficulty breathing, lightheadedness.    ED Prescriptions    Medication  Sig Dispense Auth. Provider   tiZANidine (ZANAFLEX) 2 MG tablet Take 1 tablet (2 mg total) by mouth every 8 (eight) hours as needed for up to 5 days for muscle spasms. 15 tablet Hall-Potvin, [place], PA-C     I have reviewed the PDMP during  this encounter.   Hall-Potvin, [place], [place] 05/19/20 1534

## 2020-05-23 ENCOUNTER — Other Ambulatory Visit: Payer: Self-pay

## 2020-05-23 ENCOUNTER — Ambulatory Visit
Admission: RE | Admit: 2020-05-23 | Discharge: 2020-05-23 | Disposition: A | Discharge status: Home / Self Care | Payer: Medicare Other | Source: Ambulatory Visit | Attending: Family Medicine | Admitting: Family Medicine

## 2020-05-23 ENCOUNTER — Ambulatory Visit (INDEPENDENT_AMBULATORY_CARE_PROVIDER_SITE_OTHER): Payer: Medicare Other | Admitting: Family Medicine

## 2020-05-23 ENCOUNTER — Encounter: Payer: Self-pay | Admitting: Family Medicine

## 2020-05-23 VITALS — BP 110/72 | HR 93 | Ht 70.0 in | Wt 212.0 lb

## 2020-05-23 DIAGNOSIS — G20 Parkinson's disease: Secondary | ICD-10-CM | POA: Diagnosis not present

## 2020-05-23 DIAGNOSIS — Z9181 History of falling: Secondary | ICD-10-CM | POA: Diagnosis not present

## 2020-05-23 DIAGNOSIS — G4752 REM sleep behavior disorder: Secondary | ICD-10-CM

## 2020-05-23 DIAGNOSIS — R441 Visual hallucinations: Secondary | ICD-10-CM | POA: Diagnosis not present

## 2020-05-23 DIAGNOSIS — R413 Other amnesia: Secondary | ICD-10-CM

## 2020-05-23 MED ORDER — CARBIDOPA-LEVODOPA ER 50-200 MG PO TBCR: 1.0000 | EXTENDED_RELEASE_TABLET | Freq: Every day | ORAL | 11 refills | Status: DC

## 2020-05-23 MED ORDER — NUPLAZID 34 MG PO CAPS: 34.0000 mg | ORAL_CAPSULE | Freq: Every day | ORAL | 11 refills | Status: DC

## 2020-05-23 NOTE — Progress Notes (Addendum)
PATIENT: Danny [name] DOB: 1951/06/26  REASON FOR VISIT: follow up HISTORY FROM: patient  Chief Complaint  Patient presents with  . Follow-up    rm 2 here for a f/u on pk. Pt's wife said pt has had an increase in falls.     HISTORY OF PRESENT ILLNESS: Today 05/23/20 Danny [name] is a 69 y.o. male here today for follow up for PD. He presents today with his wife who aids in history. He continues Sinemet 1 tablet 5 times daily (8am, 11am, 2pm, 5pm and 8pm). He had knee replacement surgery in March. He was sent to [facility] for rehab afterwards. [name] reports that Mirapex dose given was 0.71m TID. Dose had been decreased by by Dr ARexene Albertsin October to 0.531mTID. He has continued to have difficulty with falls and visual hallucinations. He has had 7 falls since April. Some falls are after standing from being in a seated position for an extended period time and others are after he has been in standing position for several minutes. He denies dizziness but his wife states that he did complain of "feeling like he was about to pass out" a few weeks ago after standing following dinner with friends. She feels that he feel get "tangled" at times. He had an injury last week where he fractured several ribs. He leans to the right "all the time". His wife, JuBethena Roysreports that he is constantly having visual hallucinations. Occasionally has auditory hallucinations. He has night mares frequently. Wife is not interested in starting Aricept. He can not tolerate pain medications or muscle relaxers. JuBethena Roysoes not feel that Mirapex dose increase had made any significant difference. She does not feel this is the cause of falls. He is more sleepy than normal. He is using a wheelchair for transportation. His wife is requesting an order for a aluminum wheelchair as his current chair is too heavy. He did work with PT for several weeks following surgery. PT has discharged him but OT continues due to falls. He was  previously taking Azilect but cost was a factor and it did not seem to be beneficial.   He is followed regularly by PCP. A1C and BP have been normal. BP has fluctuated from 150's/90's to 90's/60's.  He is eating normally. No concerns of difficulty swallowing or concerns of choking. He drinks at least 8 ounces of water with each Sinemet dose, total of 50-60 ounces daily.   HISTORY: (copied from Dr AtGuadelupe Sabinote on 02/11/2020)  Mr. KeAmsdens a very pleasant [age]ear old right-handed gentleman with an underlying medical history of type 2 diabetes, osteoarthritis, hypertension, obesity, hyperlipidemia, and cataracts, who presents for follow-up consultation of his Parkinson's disease, associated with RBD and mild forgetfulness. He is accompanied by his wifeagain today. I last saw him on 10/05/2019, at which time he reported feeling tired during the day.  His wife endorsed that he was quite sleepy during the day, particularly in the early evenings.  He has had some falls.  He had bruised his sternum and ribs.  He did fall in his kitchen and hit his head against a curtain holder and had a small area of skin injury in the right forehead.  He was advised to continue with Sinemet 1 pill 5 times a day but advised to further reduce his Mirapex to 0.5 mg 3 times daily.  I asked him to have a head CT done.  He had a head CT without contrast on 10/08/2020 and  I reviewed the results: IMPRESSION:   Normal CT head (without).  We called him with his test results.  Today, 02/11/2020: He reports overall doing okay, he did have a fall at his dentist office and needed help to get up.  He also had a recent instance at home where he was looking at something that had fallen down and he was on his hands and feet but could not get up on his own.  It was not actually a fall.  He is in need for a right knee replacement surgery, under Dr. Durward Fortes.  I signed off on the surgical clearance form today.  I explained to him that there is  no obvious contraindication in his getting a elective surgery like to wait for placement but he could have some motor setbacks and these are tied and typically with the need for pain medication and being off schedule with his other medications and the fact that he may have more immobility initially after surgery.  He got his first Covid shot, he is getting his second dose on 02/21/2020.  He is tentatively scheduled for his knee replacement in mid March.  His wife is wondering if they could or should push out his surgery until 2 or 3 weeks out from his second Covid shot.  She is encouraged to call the orthopedics office and see if the surgery could be timed at a slightly later date. He has had intermittent visual hallucination auditory hallucinations, he has on a rare occasion acted out on these hallucinations by opening the front door and trying to look for someone that he had heard or seen.  The patient's allergies, current medications, family history, past medical history, past social history, past surgical history and problem list were reviewed and updated as appropriate.    Previously (copied from previous notes for reference):    I saw him on 06/04/2019, at which time he reported difficulty with his mobility. He had fallen, thankfully without major injuries. His wife had noticed more forgetfulness and some confusion at times. He was more sleepy during the day. He was taking Sinemet 4 times a day and had more difficulty in the mornings. He was on Mirapex 3 times a day. He had intermittent constipation. He reported fleeting hallucinations, nothing sustained or anxiety provoking. He was advised to increase his Sinemet to 1 pill 5 times a day at 3 hourly intervals starting at 8 AM. He was advised to continue with Mirapex 3 times daily. He was advised to be very proactive about constipation issues.  I saw him on 12/03/2018, at which time hewas advised to continue with Sinemet 1 pill 4 times  a day. He did have her fall incident after he missed the chair when sitting down. His memory was stable for the most part. He was advised to hydrate well and be proactive about constipation issues, we talked about fall prevention.   I saw him on 06/05/2018, at which time he felt fairly stable but his wife had noticed more shuffling and also some mood irritability. He had no falls thankfully and some forgetfulness was noted. He was more sleepy during the day. Suggested he continue with generic Mirapex 0.75 mg 3 times a day but increase his Sinemet from 3 times a day to 1 pill 4 times a day.  His wife called in the interim in October 2019 requesting a letter to support their cancellation of their timeshare as he was not able to drive and they were not able  to utilize their timeshare.   I saw him on 12/05/2017, at which time he felt fairly stable on Sinemet, we had reduced his Mirapex to 0.75 mg 3 times a day. He is trying to stay active. He had no recent falls. I suggested we continue with his Sinemet and Mirapex at the current doses.  I saw him on 09/09/2017 at which time he had been more sleepy during the day. His wife was worried that he was more preoccupied with sex. He had ongoing issues with intermittent double vision. He had intermittent and mild dream enactments. Suggested we gradually start him on low-dose Sinemet starting with half pill and gradually increasing this to 1 pill 3 times a day. His wife called in the interim in October reporting that he had done fairly well with Sinemet, at which time we reduced his Mirapex from 1 mg 3 times a day to 0.75 mg 3 times a day.    I saw him on 03/04/2017, at which time he felt stable. He retired in December 2017. The daughter was living with them. Mother-in-law had moved in as well. His wife retired also. He was going to the gym on a regular basis. Memory-wise, he had mild forgetfulness, no mood issues, no sleep issues, no side effects from the  Mirapex. He had no recent falls thankfully. He had some mild dream enactments but no major issues. Looking back, his symptoms of peak date back to 2014, he had a sleep study in early 2014 with PLMS noted but no OSA and he denies restless leg symptoms. We mutually agreed to taper him off of Azilect as it was too expensive. I suggested in lieu of the Azilect, we increase his pramipexole to 1 mg 3 times a day.   I saw him on 09/03/2016, at which time he reported doing well, no telltale differences after increasing the Mirapex. He gave up driving. He decided to retire by the end of December 2017. His mother-in-law was supposed to move in with them. His memory was stable, sleep was stable, no real mood issues, no recent falls. I suggested we keep his medications the same including Azilect 1 mg once daily, Mirapex 0.75 mg 3 times a day.  I saw him on 02/27/2016, at which time he reported more difficulty with his walking, he had become slower. Wife had noted that it would take him longer to do things. He was dragging his feet at times. He was not swinging his arms as well. He was working full-time as a Land, avoiding [name] and heights. His A1c in December 2016 was a little up at 7.6. Memory was stable, with some mild forgetfulness. Mood was stable, RBD was infrequent. He reported no recent falls. I suggested he continue with Azilect and we increased his Mirapex to 0.75 mg 3 times a day.   I saw him on 08/29/2015 at which time he reported doing fairly well. He was able to tolerate Azilect. His Mirapex was 0.5 mg 3 times a day. He was working at the computer most of the time. He was not always drinking enough water. He had a good appetite. He felt his memory was stable. RBD was less frequent but still occurring. His wife was able to sleep in the same bed with him. He had no recent falls. He had some recent blurry vision was going to see his ophthalmologist. We mutually agreed to continue his  current medication regimen.  I saw him on 04/25/2015, at which time he  reported doing fairly well overall, but had noted smaller changes, including more slowness, difficulty with climbing ladders. He was advised by his boss to not climb ladders or walk over obstacles at work. Thankfully, his work environment is understanding. He had another business trip coming up and a colleague was to drive and do the climbing on the sites. He reported mild forgetfulness and some problems with depth perception. His diplopia had improved since he was given prism glasses by his ophthalmologist. I suggested, we try him on Azilect and keep his Mirapex the same.   I saw him on 02/04/2015, at which time he reported ongoing issues with diplopia, particularly at night while driving. He was still working full-time. He was still on low-dose Mirapex, 0.25 mg 3 times a day. He had no side effects. He had had a sleep study which per his report did not show any OSA. I asked him to make an appointment with his eye doctor. I suggested an increase in Mirapex to 0.5 mg 3 times a day.  I first met him on 12/28/2014, at which time he reported that he was having more fatigue. However, he also had reduce his caffeine intake. He had no recent falls and mood stable. He was working full-time. He reported that he had intermittent double vision in the last 3 months. His PCP requested a sooner than scheduled appointment for diplopia. His exam was stable at the time and I suggested no new medication changes.  He has had some short-term memory issues. He works full-time. He is an Chief Financial Officer. He likes square dance but his wife has noted that he has had more problems with his nighttime driving and his squared hands. In the recent 3 months he has had some intermittent double vision at night. He has drooling at night. He rarely drinks alcohol and usually drinks 4-5 cups of coffee per day, occasional sodas. He quit smoking in 1978. Blood pressure is low  for him today. It was recently noted to be trending lower and his blood pressure medication was reduced. He takes his cholesterol medication every other day.  He had a brain MRI without contrast on 11/04/2013: Normal MRI scan of the brain. Incidental findings of a chronic paranasal sinusitis with deviated nasal septum to the left. He previously followed with Dr. Jim Like and was last seen by him on 08/30/2014, at which time his Mirapex was kept at 0.25 mg 3 times a day. He reported no side effects, in particular no impulse control disorder. I reviewed Dr. Hazle Quant notes. He first met Dr. Janann Colonel on 10/26/13, at which time the patient reported a right hand tremor noticed over the previous 4-6 months. He had changes in his handwriting, slowness, some stiffness and changes in his walking.His wife reported a longer standing history of REM behavior disorder. He has been in outpatient physical therapy.   REVIEW OF SYSTEMS: Out of a complete 14 system review of symptoms, the patient complains only of the following symptoms, daytime sleepiness, memory loss, frequent falls, hallucinations, and all other reviewed systems are negative.  ALLERGIES: Allergies  Allergen Reactions  . Codeine Nausea And Vomiting    HOME MEDICATIONS: Outpatient Medications Prior to Visit  Medication Sig Dispense Refill  . Ascorbic Acid (VITAMIN C ADULT GUMMIES PO) Take 2 tablets by mouth daily.     Marland Kitchen aspirin 81 MG chewable tablet Chew 1 tablet (81 mg total) by mouth 2 (two) times daily.    . carbidopa-levodopa (SINEMET IR) 25-100 MG tablet Take  1 tablet by mouth 5 (five) times daily. 450 tablet 3  . Cinnamon 500 MG capsule Take 500 mg by mouth daily.    . famotidine (PEPCID) 20 MG tablet Take 20 mg by mouth in the morning and at bedtime.     . Ginger, Zingiber officinalis, 550 MG CAPS Take 550 mg by mouth daily.      Marland Kitchen glipiZIDE (GLUCOTROL XL) 10 MG 24 hr tablet TAKE 1 TABLET BY MOUTH DAILY (Patient taking differently:  Take 10 mg by mouth at bedtime. ) 90 tablet 3  . Glucosamine-Chondroit-Vit C-Mn (GLUCOSAMINE CHONDR 1500 COMPLX PO) Take 1,500 mg by mouth daily.      Marland Kitchen ketoconazole (NIZORAL) 2 % cream Apply 1 application topically 2 (two) times daily.     . metFORMIN (GLUCOPHAGE) 1000 MG tablet Take 1 tablet (1,000 mg total) by mouth 2 (two) times daily with a meal. 20 tablet 0  . Multiple Vitamin (MULTIVITAMIN) tablet Take 1 tablet by mouth daily.    . pramipexole (MIRAPEX) 0.5 MG tablet Take 1 tablet (0.5 mg total) by mouth 3 (three) times daily. 270 tablet 3  . simvastatin (ZOCOR) 40 MG tablet TAKE 0.5 TABLETS (20 MG TOTAL) BY MOUTH DAILY. (Patient taking differently: Take 20 mg by mouth every other day. ) 45 tablet 1  . enalapril (VASOTEC) 2.5 MG tablet Take 1 tablet by mouth  daily (Patient taking differently: Take 2.5 mg by mouth daily. ) 90 tablet 1  . linagliptin (TRADJENTA) 5 MG TABS tablet Take 1 tablet (5 mg total) by mouth daily. 30 tablet 0  . methocarbamol (ROBAXIN) 500 MG tablet Take 1 tablet (500 mg total) by mouth every 8 (eight) hours as needed for muscle spasms. (Patient not taking: Reported on 05/10/2020) 20 tablet 0  . oxyCODONE (OXY IR/ROXICODONE) 5 MG immediate release tablet Take 1 tablet (5 mg total) by mouth every 4 (four) hours as needed for moderate pain (pain score 4-6). (Patient not taking: Reported on 05/10/2020) 40 tablet 0  . tiZANidine (ZANAFLEX) 2 MG tablet Take 1 tablet (2 mg total) by mouth every 8 (eight) hours as needed for up to 5 days for muscle spasms. 15 tablet 0   No facility-administered medications prior to visit.    PAST MEDICAL HISTORY: Past Medical History:  Diagnosis Date  . Allergy    SEASONAL  . Cataract    BILATERAL-REMOVED  . Essential hypertension, benign    PT.DENIES ON AS PREVENTIVE 08/17/19  . GERD (gastroesophageal reflux disease)   . Hx of adenomatous polyp of colon 09/04/2019   08/2019 diminutive adenoma No recall given findings and age  . Mixed  hyperlipidemia    PT.DENIES,STATED ON AS PREVENTIVE 07/30/19  . Neuromuscular disorder (HCC)    PARKINSONS  . OA (osteoarthritis) of knee    right  . Obesity, unspecified   . Osteoarthritis    KNEE AND SHOULDERS  . Parkinsons ([place])   . Type II or unspecified type diabetes mellitus without mention of complication, not stated as uncontrolled     PAST SURGICAL HISTORY: Past Surgical History:  Procedure Laterality Date  . CATARACT EXTRACTION, BILATERAL    . GANGLION CYST EXCISION  age 66 years   right  . KNEE ARTHROSCOPY     right  . KNEE ARTHROSCOPY    . TOTAL KNEE ARTHROPLASTY Right 03/08/2020   Procedure: RIGHT TOTAL KNEE ARTHROPLASTY;  Surgeon: Garald Balding, MD;  Location: [facility];  Service: Orthopedics;  Laterality: Right;  . WRIST FRACTURE SURGERY    .  WRIST SURGERY  04/2012    FAMILY HISTORY: Family History  Problem Relation Age of Onset  . Diabetes Sister   . Diabetes Daughter   . Cancer Father   . Heart disease Father   . Colon polyps Son   . Diabetes Mother   . Lung cancer Mother   . Colon cancer Neg Hx   . Esophageal cancer Neg Hx   . Rectal cancer Neg Hx   . Stomach cancer Neg Hx     SOCIAL HISTORY: Social History   Socioeconomic History  . Marital status: Married    Spouse name: [name]  . Number of children: 3  . Years of education: 16  . Highest education level: Bachelor's degree (e.g., BA, AB, BS)  Occupational History  . Occupation: Chief Financial Officer  Tobacco Use  . Smoking status: Former Smoker    Packs/day: 1.50    Years: 15.00    Pack years: 22.50    Types: Cigarettes    Quit date: 12/17/1982    Years since quitting: 37.4  . Smokeless tobacco: Never Used  . Tobacco comment: over 40 yrs  Substance and Sexual Activity  . Alcohol use: Yes    Comment: very rare  . Drug use: No  . Sexual activity: Yes    Partners: Female  Other Topics Concern  . Not on file  Social History Narrative   [name], motorcycle rider.  He lives with his wife  [name]).  He has 3 adult children.  His wife had 2 children, one of whom is deceased.   Patient is working full-time.   Patient has a Bachelor's degree.   Patient is right-handed.   Patient drinks 6 cups of coffee daily.   Social Determinants of Health   Financial Resource Strain:   . Difficulty of Paying Living Expenses:   Food Insecurity:   . Worried About Charity fundraiser in the Last Year:   . Arboriculturist in the Last Year:   Transportation Needs:   . Film/video editor (Medical):   Marland Kitchen Lack of Transportation (Non-Medical):   Physical Activity:   . Days of Exercise per Week:   . Minutes of Exercise per Session:   Stress:   . Feeling of Stress :   Social Connections:   . Frequency of Communication with Friends and Family:   . Frequency of Social Gatherings with Friends and Family:   . Attends Religious Services:   . Active Member of Clubs or Organizations:   . Attends Archivist Meetings:   Marland Kitchen Marital Status:   Intimate Partner Violence:   . Fear of Current or Ex-Partner:   . Emotionally Abused:   Marland Kitchen Physically Abused:   . Sexually Abused:       PHYSICAL EXAM  Vitals:   05/23/20 1321  BP: 110/72  Pulse: 93  Weight: 212 lb (96.2 kg)  Height: _0  (1.778 m)   Body mass index is 30.42 kg/m.  Generalized: Well developed, in no acute distress  Cardiology: normal rate and rhythm, no murmur noted Respiratory: clear to auscultation bilaterally  Neurological examination  Mentation: Alert oriented to time, place, and history taking. Follows all commands speech and language fluent. Patient experiencing visual hallucinations in office.  Cranial nerve II-XII: Pupils were equal round reactive to light. Extraocular movements were full, visual field were full on confrontational test. Facial sensation and strength were normal. Uvula tongue midline. Head turning and shoulder shrug  were normal and symmetric. Motor: The  motor testing reveals 5 over 5 strength of  all 4 extremities. Good symmetric motor tone is noted throughout. No tremor noted, bradykinesia with finger and toe taps. Cogwheel rigidity of left upper extremity.  Sensory: Sensory testing is intact to soft touch on all 4 extremities. No evidence of extinction is noted.  Coordination: Cerebellar testing reveals slow finger-nose-finger and heel-to-shin bilaterally but able to complete.  Gait and station: Gait not assessed today as patient in wheelchair and not comfortable standing. No assistive device or gait belt available.   Skin: erythematous abrasion with small hematoma to right parietal region of skull, no infectious signs noted. No drainage.   DIAGNOSTIC DATA (LABS, IMAGING, TESTING) - I reviewed patient records, labs, notes, testing and imaging myself where available.  MMSE - Mini Mental State Exam 06/04/2019  Orientation to time 4  Orientation to Place 5  Registration 3  Attention/ Calculation 3  Recall 2  Language- name 2 objects 2  Language- repeat 1  Language- follow 3 step command 3  Language- read & follow direction 1  Write a sentence 1  Copy design 0  Total score 25     Lab Results  Component Value Date   WBC 12.9 (H) 03/11/2020   HGB 12.5 (L) 03/11/2020   HCT 39.1 03/11/2020   MCV 97.0 03/11/2020   PLT 168 03/11/2020      Component Value Date/Time   NA 135 03/11/2020 0329   NA 138 04/16/2018 1005   K 4.2 03/11/2020 0329   CL 103 03/11/2020 0329   CO2 22 03/11/2020 0329   GLUCOSE 140 (H) 03/11/2020 0329   BUN 25 (H) 03/11/2020 0329   BUN 21 04/16/2018 1005   CREATININE 1.03 03/11/2020 0329   CREATININE 1.03 10/02/2016 1605   CALCIUM 8.8 (L) 03/11/2020 0329   PROT 6.9 03/01/2020 1011   PROT 6.8 04/16/2018 1005   ALBUMIN 3.9 03/01/2020 1011   ALBUMIN 4.3 04/16/2018 1005   AST 20 03/01/2020 1011   ALT 10 03/01/2020 1011   ALKPHOS 48 03/01/2020 1011   BILITOT 1.3 (H) 03/01/2020 1011   BILITOT 0.8 04/16/2018 1005   GFRNONAA >60 03/11/2020 0329    GFRNONAA 76 02/01/2015 1110   GFRAA >60 03/11/2020 0329   GFRAA 88 02/01/2015 1110   Lab Results  Component Value Date   CHOL 126 04/16/2018   HDL 54 04/16/2018   LDLCALC 53 04/16/2018   TRIG 93 04/16/2018   CHOLHDL 2.3 04/16/2018   Lab Results  Component Value Date   HGBA1C 6.4 (H) 03/08/2020   No results found for: VITAMINB12 Lab Results  Component Value Date   TSH 4.34 10/02/2016       ASSESSMENT AND PLAN 69 y.o. year old male  has a past medical history of Allergy, Cataract, Essential hypertension, benign, GERD (gastroesophageal reflux disease), adenomatous polyp of colon (09/04/2019), Mixed hyperlipidemia, Neuromuscular disorder ([place]), OA (osteoarthritis) of knee, Obesity, unspecified, Osteoarthritis, Parkinsons ([place]), and Type II or unspecified type diabetes mellitus without mention of complication, not stated as uncontrolled. here with     ICD-10-CM   1. Parkinson disease ([place])  G20 CT HEAD WO CONTRAST    For home use only DME standard manual wheelchair with seat cushion  2. RBD (REM behavioral disorder)  G47.52   3. History of recent fall  Z91.81 CT HEAD WO CONTRAST    For home use only DME standard manual wheelchair with seat cushion  4. Visual hallucinations  R44.1 For home use only DME standard manual  wheelchair with seat cushion  5. Memory loss  R41.3 For home use only DME standard manual wheelchair with seat cushion    Layne has continued to have difficulty with frequent falls. He has fallen multiple times in the past month. Some falls may have been after changing positions but some have been while walking for a period of time. He did hit his head last night and has mild abrasion with small hematoma of right parietal region of skull. We will order CT for evaluation. BP readings have been somewhat labile at home with reading ranges from 99-774 systolic and 14-23 diastolic. Patient not able to perform orthostatics today. I have asked that she watch for this at home. I  have also encouraged them to speak with PCP if continued concerns of labile blood pressures. PT has signed off but OT continues to work with him due to falls. Wife reports concerns of "stiffness" when working with PT We will continue Sinemet IR dosing at 1 tablet 5 times daily. I will add Sinemet CR 50-255m every day at bedtime. May increase to BID dosing if well tolerated. JBethena Royswas educated on the need to monitor BP closely and fall precautions reviewed. I advised JBethena Roysto reduce Mirapex dose to 0.542mTID. I am hopeful that this may help with increased sleepiness. We have discussed adding Nuplazid for PD psychosis. Will send orders today. Possible side effects reviewed. Additional information provided in AVS. We will send order for aluminum wheelchair as requested by JuBethena RoysAdequate hydration, well balanced diet and regular physical activity when safe to do so advised. He will return for follow up in 6 weeks. Mr and Mrs KeForrestererbalize understanding and agreement with this plan.    Orders Placed This Encounter  Procedures  . For home use only DME standard manual wheelchair with seat cushion    Patient suffers from Parkinson's Disease which impairs their ability to perform daily activities like bathing, dressing, feeding, grooming and toileting in the home.  A cane, crutch or walker will not resolve issue with performing activities of daily living. A wheelchair will allow patient to safely perform daily activities. Patient can safely propel the wheelchair in the home or has a caregiver who can provide assistance. Length of need Lifetime. Accessories: elevating leg rests (ELRs), wheel locks, extensions and anti-tippers.  Patient family request aluminum wheelchair.  . CT HEAD WO CONTRAST    Standing Status:   Future    Number of Occurrences:   1    Standing Expiration Date:   05/23/2021    Order Specific Question:   Preferred imaging location?    Answer:   [id number]    Order Specific Question:    Radiology Contrast Protocol - do NOT remove file path    Answer:   [email or web address]     Meds ordered this encounter  Medications  . Pimavanserin Tartrate (NUPLAZID) 34 MG CAPS    Sig: Take 1 capsule (34 mg total) by mouth daily.    Dispense:  30 capsule    Refill:  11    Order Specific Question:   Supervising Provider    Answer:   AHMelvenia [id number]. carbidopa-levodopa (SINEMET CR) 50-200 MG tablet    Sig: Take 1 tablet by mouth at bedtime.    Dispense:  30 tablet    Refill:  11    Order Specific Question:   Supervising Provider    Answer:   AHMelvenia Beam1[[id number]]  I spent 45 minutes with the patient. 50% of this time was spent counseling and educating patient on plan of care and medications.     Debbora Presto, FNP-C 05/23/2020, 4:25 PM [facility] [address], [place], [place] [address] [phone]  I reviewed the above note and documentation by the Nurse Practitioner and agree with the history, exam, assessment and plan as outlined above. I was available for consultation. [name], MD, PhD [facility])

## 2020-05-23 NOTE — Patient Instructions (Addendum)
We will continue Sinemet IR 1 tablet five times daily. We will add Sinemet CR 50-200mg  daily.   We will try to get Nuplazid covered for hallucinations.   Reduce Mirapex to 0.5mg  three times daily.   Continue working with OT.   Talk with PCP about concerns of labile BP.  Follow up with Dr Rexene Alberts in 6 weeks.   Pimavanserin oral tablet or capsule What is this medicine? PIMAVANSERIN (Pi ma VAN ser in) is used to treat hallucinations (hearing voices, seeing things that are not there) and delusions (having beliefs that are not true) associated with Parkinson's disease. This medicine may be used for other purposes; ask your health care provider or pharmacist if you have questions. COMMON BRAND NAME(S): NUPLAZID What should I tell my health care provider before I take this medicine? They need to know if you have any of these conditions:  dementia  heart disease  history of irregular heartbeat  kidney disease  low levels of magnesium or potassium in the blood  an unusual or allergic reaction to pimavanserin, other medicines, foods, dyes, or preservatives  pregnant or trying to get pregnant  breast-feeding How should I use this medicine? Take this medicine by mouth with a glass of water. Follow the directions on the prescription label. You can take it with or without food. If it upsets your stomach, take it with food. Take your medicine at regular intervals. Do not take it more often than directed. Do not stop taking except on your doctor's advice. Talk to your pediatrician regarding the use of this medicine in children. This medicine is not approved for use in children. Overdosage: If you think you have taken too much of this medicine contact a poison control center or emergency room at once. NOTE: This medicine is only for you. Do not share this medicine with others. What if I miss a dose? If you miss a dose, take it as soon as you can. If it is almost time for your next dose, take  only that dose. Do not take double or extra doses. What may interact with this medicine? Do not take this medicine with any of the following medications:  certain medicines for fungal infections like fluconazole, itraconazole, ketoconazole, posaconazole, voriconazole  cisapride  dronedarone  pimozide  thioridazine This medicine may also interact with the following medications:  carbamazepine  certain antivirals for HIV or hepatitis like ritonavir  certain antibiotics like clarithromycin, gatifloxacin, moxifloxacin  certain medicines for irregular heart beat like amiodarone, disopyramide, propafenone, quinidine, sotalol  fosphenytoin  modafinil  nafcillin  phenobarbital  primidone  phenytoin  other medicines for psychotic disturbances  other medicines that prolong the QT interval (cause an abnormal heart rhythm) like dofetilide  rifampin  St. John's Wort This list may not describe all possible interactions. Give your health care provider a list of all the medicines, herbs, non-prescription drugs, or dietary supplements you use. Also tell them if you smoke, drink alcohol, or use illegal drugs. Some items may interact with your medicine. What should I watch for while using this medicine? Visit your doctor or health care professional for regular checks on your progress. Tell your doctor or healthcare professional if your symptoms do not start to get better or if they get worse. What side effects may I notice from receiving this medicine? Side effects that you should report to your doctor or health care professional as soon as possible:  allergic reactions like skin rash, itching or hives, swelling of the face,  lips, or tongue  breathing problems  confusion  falls  increase in hallucination, loss of contact with reality  loss of balance or coordination  signs and symptoms of a dangerous change in heartbeat or heart rhythm like chest pain; dizziness; fast or  irregular heartbeat; palpitations; feeling faint or lightheaded, falls; breathing problems  swelling of the ankles, feet, hands Side effects that usually do not require medical attention (report to your doctor or health care professional if they continue or are bothersome):  constipation  drowsiness  nausea  tiredness This list may not describe all possible side effects. Call your doctor for medical advice about side effects. You may report side effects to FDA at 1-800-FDA-1088. Where should I keep my medicine? Keep out of the reach of children. Store at room temperature between 15 and 30 degrees C (59 and 86 degrees F). Throw away any unused medicine after the expiration date. NOTE: This sheet is a summary. It may not cover all possible information. If you have questions about this medicine, talk to your doctor, pharmacist, or health care provider.  2020 Elsevier/Gold Standard (2018-11-25 07:50:30)   Parkinson's Disease Parkinson's disease causes problems with movements. It is a long-term condition. It gets worse over time (is progressive). It affects each person in different ways. It makes it harder for you to:  Control how your body moves.  Move your body normally. The condition can range from mild to very bad (advanced). What are the causes? This condition results from a loss of brain cells called neurons. These brain cells make a chemical called dopamine, which is needed to control body movement. As the condition gets worse, the brain cells make less dopamine. This makes it hard to move or control your movements. The exact cause of this condition is not known. What increases the risk?  Being male.  Being age 70 or older.  Having family members who had Parkinson's disease.  Having had an injury to the brain.  Being very sad (depressed).  Being around things that are harmful or poisonous. What are the signs or symptoms? Symptoms of this condition can vary. The main  symptoms have to do with movement. These include:  A tremor or shaking while you are resting that you cannot control.  Stiffness in your neck, arms, and legs.  Slowing of movement. This may include: ? Losing expressions of the face. ? Having trouble making small movements that are needed to button your clothing or brush your teeth.  Walking in a way that is not normal. You may walk with short, shuffling steps.  Loss of balance when standing. You may sway, fall backward, or have trouble making turns. Other symptoms include:  Being very sad, worried, or confused.  Seeing or hearing things that are not real.  Losing thinking abilities (dementia).  Trouble speaking or swallowing.  Having a hard time pooping (constipation).  Needing to pee right away, peeing often, or not being able to control when you pee or poop.  Sleep problems. How is this treated? There is no cure. The goal of treatment is to manage your symptoms. Treatment may include:  Medicines.  Therapy to help with talking or movement.  Surgery to reduce shaking and other movements that you cannot control. Follow these instructions at home: Medicines  Take over-the-counter and prescription medicines only as told by your doctor.  Avoid taking pain or sleeping medicines. Eating and drinking  Follow instructions from your doctor about what you cannot eat or drink.  Do not drink alcohol. Activity  Talk with your doctor about if it is safe for you to drive.  Do exercises as told by your doctor. Lifestyle      Put in grab bars and railings in your home. These help to prevent falls.  Do not use any products that contain nicotine or tobacco, such as cigarettes, e-cigarettes, and chewing tobacco. If you need help quitting, ask your doctor.  Join a support group. General instructions  Talk with your doctor about what you need help with and what your safety needs are.  Keep all follow-up visits as told by  your doctor, including any therapy visits to help with talking or moving. This is important. Contact a doctor if:  Medicines do not help your symptoms.  You feel off-balance.  You fall at home.  You need more help at home.  You have trouble swallowing.  You have a very hard time pooping.  You have a lot of side effects from your medicines.  You feel very sad, worried, or confused. Get help right away if:  You were hurt in a fall.  You see or hear things that are not real.  You cannot swallow without choking.  You have chest pain or trouble breathing.  You do not feel safe at home.  You have thoughts about hurting yourself or others. If you ever feel like you may hurt yourself or others, or have thoughts about taking your own life, get help right away. You can go to your nearest emergency department or call:  Your local emergency services (911 in the [place]  A suicide crisis helpline, such as the [place] at 786-729-0969. This is open 24 hours a day. Summary  This condition causes problems with movements.  It is a long-term condition. It gets worse over time.  There is no cure. Treatment focuses on managing your symptoms.  Talk with your doctor about what you need help with and what your safety needs are.  Keep all follow-up visits as told by your doctor. This is important. This information is not intended to replace advice given to you by your health care provider. Make sure you discuss any questions you have with your health care provider. Document Revised: 02/19/2019 Document Reviewed: 02/19/2019 Elsevier Patient Education  [place].    Psychosis Psychosis, also called thought disturbance, refers to a severe loss of contact with reality. People having a psychotic episode are not able to think clearly, and their emotions and responses do not match with what is actually happening. People having a psychotic episode may have  false beliefs about what is happening or who they are (delusions). They may see, hear, taste, smell, or feel things that are not present (hallucinations). They may also be very upset (agitated), have chaotic behavior, or be very quiet and withdrawn. What are the causes? This condition may be caused by: Very serious mental health (psychiatric) conditions such as schizophrenia, bipolar disorder, or major depression. Use of drugs such as hallucinogens or alcohol. Medical conditions such as delirium or neurological disorders. What are the signs or symptoms? Symptoms of this condition include: Delusions, such as: Feeling a lot of fear or suspicion (paranoia). Believing something that is odd, unrealistic, or false, such as believing that you are someone else. Hallucinations, such as: Hearing or seeing things, smelling odors, experiencing tastes, or feeling bodily sensations. Command hallucinations that direct you to do something that could be dangerous. Disorganized thinking, such as thoughts that  jump from one idea to another in a way that does not make sense. Disorganized speech, such as saying things that do not make sense, echoing others, or using words based on their sound rather than their meaning. Inappropriate behavior, such as talking to yourself, showing a clear increase or decrease in activity, or intruding on unfamiliar people. How is this diagnosed? This condition is diagnosed based on an assessment by a health care provider. The health care provider may ask questions about: Your thoughts, feelings, and behavior. Any medical conditions you have. Any use of alcohol or drugs. One or more of the following may also be done: A physical exam. Blood tests. Brain imaging, such as a CT scan or MRI. A brain wave study (electroencephalogram, or EEG). The health care provider may refer you to a mental health professional for further tests. How is this treated? Treatment for this condition  may depend on the cause of the psychosis. Treatment may include one or more of the following: Supportive care and monitoring in the emergency room or hospital. You may need to stay in the hospital if you are a danger to yourself or others. Taking antipsychotic medicines to reduce symptoms and to balance chemicals in the brain. Treating an underlying medical condition. Stopping or reducing drugs that are causing psychosis. Therapy and other supportive programs, such as: Ongoing treatment and care from a mental health professional. Individual or family therapy. Training to learn new skills to cope with the psychosis and prevent further episodes. Follow these instructions at home: Take over-the-counter and prescription medicines only as told by your health care provider. Consult a health care provider before taking over-the-counter medicines, herbs, or supplements. Surround yourself with people who care about you and can help manage your condition. Keep stress under control. Stress may trigger psychosis and make symptoms worse. Maintain a healthy lifestyle. This includes: Eating a healthy diet. Getting enough sleep. Exercising regularly. Avoiding alcohol, nicotine, and recreational drugs. Keep all follow-up visits as told by your health care provider. This is important. Contact a health care provider if: Medicines do not seem to be helping. You or others notice that you: Continue to see, smell, or feel things that are not there. Hear voices telling you to do things. Feel extremely fearful and suspicious that someone or something will harm you. Feel unable to leave your house. Have trouble taking care of yourself. You have side effects of medicines, such as: Changes in sleep patterns. Dizziness. Weight gain. Restlessness. Movement changes. Shaking that you cannot control (tremors). Get help right away if: You have serious side effects of medicine, such as: Swelling of the face, lips,  tongue, or throat. Fever, confusion, muscle spasms, or seizures. You have serious thoughts about harming yourself or hurting others. If you ever feel like you may hurt yourself or others, or have thoughts about taking your own life, get help right away. You can go to your nearest emergency department or call: Your local emergency services (911 in the [place] A suicide crisis helpline, such as the [place] at (623)354-2159. This is open 24 hours a day. Summary Psychosis refers to a severe loss of contact with reality. People having a psychotic episode are not able to think clearly, and they may have delusions or hallucinations. Psychosis is a serious medical condition that should be treated by a medical professional as soon as possible. Being checked and treated right away can stop or reduce symptoms. This prevents more serious problems from developing. In  some cases, treatment may include taking antipsychotic medicines to reduce symptoms and to balance chemicals in the brain. Support programs may help you learn new skills to cope with the psychosis and prevent further episodes. This information is not intended to replace advice given to you by your health care provider. Make sure you discuss any questions you have with your health care provider. Document Revised: 02/14/2018 Document Reviewed: 01/14/2018 Elsevier Patient Education  2020 [employer].

## 2020-05-25 ENCOUNTER — Telehealth: Payer: Self-pay | Admitting: Orthopaedic Surgery

## 2020-05-25 NOTE — Telephone Encounter (Signed)
Danny [name], from [facility] called requesting an extension of PT for the following:  1x a week for 6 weeks.  Patient had a fall which resulted in some broken ribs.  This is why the PT is asking for an extension.  CB#[phone].  Thank you.

## 2020-05-25 NOTE — Telephone Encounter (Signed)
Please advise 

## 2020-05-25 NOTE — Telephone Encounter (Signed)
[name] with [name] called putting in a verbal order with the frequency of once a week for 6 weeks.   Rah CB# [phone]

## 2020-05-25 NOTE — Progress Notes (Signed)
Order for Aluminum wheelchair sent to [facility] via fax. Confirmation received that the order transmitted was successful.

## 2020-05-26 ENCOUNTER — Telehealth: Payer: Self-pay | Admitting: *Deleted

## 2020-05-26 ENCOUNTER — Telehealth: Payer: Self-pay | Admitting: Orthopaedic Surgery

## 2020-05-26 ENCOUNTER — Telehealth: Payer: Self-pay | Admitting: Family Medicine

## 2020-05-26 ENCOUNTER — Other Ambulatory Visit: Payer: Self-pay | Admitting: Family Medicine

## 2020-05-26 DIAGNOSIS — G20 Parkinson's disease: Secondary | ICD-10-CM

## 2020-05-26 DIAGNOSIS — Z7409 Other reduced mobility: Secondary | ICD-10-CM

## 2020-05-26 DIAGNOSIS — R531 Weakness: Secondary | ICD-10-CM

## 2020-05-26 NOTE — Telephone Encounter (Signed)
Sandy, I have placed a referral to PT for evaluation of wheelchair needs. Can you please update his wife that we will send someone to do a formal eval so that we can get him the wheelchair that would be best for him. TY!

## 2020-05-26 NOTE — Telephone Encounter (Signed)
Received nuplazid enrollment stating [facility] verified benefits and is covered and does not require PA.  [phone] fax [phone].

## 2020-05-26 NOTE — Telephone Encounter (Signed)
I called Merrie Roof with OT [facility] back and pt is being seen on recommendation of Dr. Durward Fortes.  He will call there office.  Merrie Roof verbalized understanding.,

## 2020-05-26 NOTE — Telephone Encounter (Signed)
OT Merrie Roof has called for verbal orders for 1 time a week for 6 weeks.  Merrie Roof can be called at [phone]

## 2020-05-26 NOTE — Telephone Encounter (Signed)
Received notice from [facility] about aluminum wheelchair (notes need to have criteria).  Placed at NP desk for review and to addend note as needed.

## 2020-05-26 NOTE — Telephone Encounter (Signed)
I called wife of pt and relayed that AL/NP thought best to have PT referral for him relating to wheelchair, to be evaluated for this to get equipment that fits him and her as the caregiver.  She verbalized udnerstanding.  This referral is oupt not HH (although she may mention to [facility] OT that he will be having this done).  She verbalized understanding.

## 2020-05-26 NOTE — Telephone Encounter (Signed)
Please advise 

## 2020-05-26 NOTE — Telephone Encounter (Signed)
[name] with [name] called stating he's going to see the pt today and still needs the verbal orders cleared.   CB# [phone]

## 2020-05-31 NOTE — Progress Notes (Unsigned)
Approved.  This drug has been approved under the Member's Medicare Part D benefit. Approved quantity: 30 capsules per 30 day(s). You may fill up to a 90 day supply except for those on Specialty Tier 5, which can be filled up to a 30 day supply. Please call the pharmacy to process the prescription claim.  [employer] has been notified --- PA started through Cover my meds for Nuplazid 34mg   30 tab and 11 refills.Pt will be contacted when decision is made from [facility]

## 2020-06-02 NOTE — Telephone Encounter (Signed)
Noted  

## 2020-06-02 NOTE — Telephone Encounter (Signed)
The Patient Access Manager of [facility](Jim) has called asking that Lovey Newcomer RN be aware that pt wants to delay taking Nuplazid because the hallucinations were believed to be from another medication.  Clair Gulling can be reached at [phone]

## 2020-06-14 NOTE — Telephone Encounter (Signed)
Mia Creek from [facility] called to check the status of the pt's nuplazid. He reports that pt was supposed to have reached to discuss the nuplazid within the past week. Please call pt and Mia Creek at [phone] to discuss.

## 2020-06-14 NOTE — Telephone Encounter (Signed)
I called pts wife.  Pt not started on the nuplazid mainly because of OOP $945.00 cost to them.  She did mention that pt on 0.75 mirapex and that was decreased to 0.5mg  which has made some small difference.  They are still continuuing to hold off on nuplazid until seeing Dr. Rexene Alberts 07-06-20.  Mia Creek was updated with [facility].

## 2020-06-17 ENCOUNTER — Other Ambulatory Visit: Payer: Self-pay | Admitting: Neurology

## 2020-06-30 ENCOUNTER — Encounter: Payer: Self-pay | Admitting: Orthopaedic Surgery

## 2020-06-30 ENCOUNTER — Ambulatory Visit (INDEPENDENT_AMBULATORY_CARE_PROVIDER_SITE_OTHER): Payer: Medicare Other | Admitting: Orthopaedic Surgery

## 2020-06-30 ENCOUNTER — Ambulatory Visit (INDEPENDENT_AMBULATORY_CARE_PROVIDER_SITE_OTHER): Payer: Medicare Other

## 2020-06-30 ENCOUNTER — Other Ambulatory Visit: Payer: Self-pay

## 2020-06-30 VITALS — Ht 70.0 in | Wt 212.0 lb

## 2020-06-30 DIAGNOSIS — M25061 Hemarthrosis, right knee: Secondary | ICD-10-CM | POA: Diagnosis not present

## 2020-06-30 DIAGNOSIS — Z96651 Presence of right artificial knee joint: Secondary | ICD-10-CM | POA: Diagnosis not present

## 2020-06-30 DIAGNOSIS — M6140 Other calcification of muscle, unspecified site: Secondary | ICD-10-CM

## 2020-06-30 DIAGNOSIS — M1711 Unilateral primary osteoarthritis, right knee: Secondary | ICD-10-CM | POA: Diagnosis not present

## 2020-06-30 DIAGNOSIS — M9689 Other intraoperative and postprocedural complications and disorders of the musculoskeletal system: Secondary | ICD-10-CM

## 2020-06-30 MED ORDER — LIDOCAINE HCL 1 % IJ SOLN
2.0000 mL | INTRAMUSCULAR | Status: AC | PRN
  Administered 2020-06-30: 2 mL

## 2020-06-30 NOTE — Progress Notes (Signed)
Office Visit Note   Patient: Danny [name]           Date of Birth: [birth year]           MRN: [id number] Visit Date: 06/30/2020              Requested by: [name], [address] [place],  [place] [id number] PCP: [name], PA   Assessment & Plan: Visit Diagnoses:  1. Unilateral primary osteoarthritis, right knee   2. Total knee replacement status, right   3. Hemarthrosis of right knee   4. Heterotopic calcification, postoperative   5.      Probable proximal patella avulsion fracture right knee    Plan: #1: Aspiration of 30 mL serosanguineous fluid without fat globules noted. #2: We are going to schedule him for an MRI scan of his right total knee replacement to evaluate the quadriceps tendons attachment. #3: Use the knee immobilizer as needed for ambulation  Follow-Up Instructions: Return in about 2 weeks (around 07/14/2020) for follow up MRI.  After MRI scan   Orders:  Orders Placed This Encounter  Procedures   Large Joint Inj: R knee   Anaerobic and Aerobic Culture   XR KNEE 3 VIEW RIGHT   MR Knee Right w/o contrast   Synovial cell count + diff, w/ crystals   No orders of the defined types were placed in this encounter.     Procedures: Large Joint Inj: R knee on 06/30/2020 3:26 PM Indications: pain and diagnostic evaluation Details: 18 G 1.5 in needle, anteromedial approach  Arthrogram: No  Medications: 2 mL lidocaine 1 % Aspirate: 30 mL blood-tinged (Serosanguinous ); sent for lab analysis Outcome: tolerated well, no immediate complications Procedure, treatment alternatives, risks and benefits explained, specific risks discussed. Consent was given by the patient. Immediately prior to procedure a time out was called to verify the correct patient, procedure, equipment, support staff and site/side marked as required. Patient was prepped and draped in the usual sterile fashion.    No fat globules   Clinical Data: No  additional findings.   Subjective: Chief Complaint  Patient presents with   Right Knee - Follow-up    Right TKA 03-08-2020   HPI: Patient presents today for follow up on his right knee. He had a right total knee arthroplasty on 03-08-3020. He is now 4 months out from surgery. Patient's wife states that he can hardly walk on his knee due to pain. He is not in therapy. His wife is unsure if this change with his knee is caused by his Parkinson's. He is in a wheelchair. He has fallen 8 times in June. He fractured ribs on both sides, but never had his knee x-rayed because he was not complaining of any pain from the fall.    Review of Systems  Constitutional: Negative for fatigue.  HENT: Negative for ear pain.   Eyes: Negative for pain.  Respiratory: Negative for shortness of breath.   Cardiovascular: Negative for leg swelling.  Gastrointestinal: Negative for constipation and diarrhea.  Endocrine: Negative for cold intolerance and heat intolerance.  Genitourinary: Negative for difficulty urinating.  Musculoskeletal: Positive for joint swelling.  Skin: Negative for rash.  Allergic/Immunologic: Negative for food allergies.  Neurological: Positive for weakness.  Hematological: Does not bruise/bleed easily.  Psychiatric/Behavioral: Negative for sleep disturbance.     Objective: Vital Signs: Ht 5\' 10"  (1.778 m)    Wt 212 lb (96.2 kg)    BMI 30.42 kg/m  Physical Exam Constitutional:      Appearance: Normal appearance. He is well-developed. He is obese.  Eyes:     Pupils: Pupils are equal, round, and reactive to light.  Pulmonary:     Effort: Pulmonary effort is normal.  Skin:    General: Skin is warm and dry.  Neurological:     Mental Status: He is alert and oriented to person, place, and time.  Psychiatric:        Behavior: Behavior normal.     Ortho Exam  PROM from 3 degrees to 95 degrees. Has an extensor lag prior to aspiration of about 20 degrees.  Much improved after  aspiration. Good with Varus and valgus stress. Tender to palpation over the proximal pole of the patella.  Questionable defect mid portion of superior pole of patella but otherwise intact.   Specialty Comments:  No specialty comments available.  Imaging: XR KNEE 3 VIEW RIGHT  Result Date: 06/30/2020 Three-view x-ray of the right knee reveals total knee replacement in good position alignment.  On the lateral knee superior pole of the patella there appears to be a possible avulsion fragment of the superior patella measuring about 3 mm.  There is heterotopic ossification in the distal quad area.    PMFS History: Current Outpatient Medications  Medication Sig Dispense Refill   Ascorbic Acid (VITAMIN C ADULT GUMMIES PO) Take 2 tablets by mouth daily.      aspirin 81 MG chewable tablet Chew 1 tablet (81 mg total) by mouth 2 (two) times daily.     carbidopa-levodopa (SINEMET CR) 50-200 MG tablet Take 1 tablet by mouth at bedtime. 30 tablet 11   carbidopa-levodopa (SINEMET IR) 25-100 MG tablet TAKE 1 TABLET BY MOUTH 5 (FIVE) TIMES DAILY. 450 tablet 3   Cinnamon 500 MG capsule Take 500 mg by mouth daily.     famotidine (PEPCID) 20 MG tablet Take 20 mg by mouth in the morning and at bedtime.      Ginger, Zingiber officinalis, 550 MG CAPS Take 550 mg by mouth daily.       glipiZIDE (GLUCOTROL XL) 10 MG 24 hr tablet TAKE 1 TABLET BY MOUTH DAILY (Patient taking differently: Take 10 mg by mouth at bedtime. ) 90 tablet 3   Glucosamine-Chondroit-Vit C-Mn (GLUCOSAMINE CHONDR 1500 COMPLX PO) Take 1,500 mg by mouth daily.       ketoconazole (NIZORAL) 2 % cream Apply 1 application topically 2 (two) times daily.      metFORMIN (GLUCOPHAGE) 1000 MG tablet Take 1 tablet (1,000 mg total) by mouth 2 (two) times daily with a meal. 20 tablet 0   Multiple Vitamin (MULTIVITAMIN) tablet Take 1 tablet by mouth daily.     Pimavanserin Tartrate (NUPLAZID) 34 MG CAPS Take 1 capsule (34 mg total) by mouth  daily. 30 capsule 11   pramipexole (MIRAPEX) 0.5 MG tablet Take 1 tablet (0.5 mg total) by mouth 3 (three) times daily. 270 tablet 3   simvastatin (ZOCOR) 40 MG tablet TAKE 0.5 TABLETS (20 MG TOTAL) BY MOUTH DAILY. (Patient taking differently: Take 20 mg by mouth every other day. ) 45 tablet 1   No current facility-administered medications for this visit.    Patient Active Problem List   Diagnosis Date Noted   Hemarthrosis of right knee 06/30/2020   Heterotopic calcification, postoperative 06/30/2020   Total knee replacement status, right 03/22/2020   Hematospermia 11/18/2015   Epididymal cyst 05/25/2015   Parkinson disease ([place] [facility]) 01/14/2014   Obesity (BMI 30-39.9) 07/14/2013  Confusional arousals 02/13/2013   Disorder of ligament of right wrist 01/29/2012   Diabetes mellitus type 2, uncomplicated ([place]) 27/02/5008   Mixed hyperlipidemia 10/09/2011   Unilateral primary osteoarthritis, right knee 10/09/2011   Essential hypertension, benign 10/09/2011   Past Medical History:  Diagnosis Date   Allergy    SEASONAL   Cataract    BILATERAL-REMOVED   Essential hypertension, benign    PT.DENIES ON AS PREVENTIVE 08/17/19   GERD (gastroesophageal reflux disease)    Hx of adenomatous polyp of colon 09/04/2019   08/2019 diminutive adenoma No recall given findings and age   Mixed hyperlipidemia    PT.DENIES,STATED ON AS PREVENTIVE 07/30/19   Neuromuscular disorder (HCC)    PARKINSONS   OA (osteoarthritis) of knee    right   Obesity, unspecified    Osteoarthritis    KNEE AND SHOULDERS   Parkinsons ([place])    Type II or unspecified type diabetes mellitus without mention of complication, not stated as uncontrolled     Family History  Problem Relation Age of Onset   Diabetes Sister    Diabetes Daughter    Cancer Father    Heart disease Father    Colon polyps Son    Diabetes Mother    Lung cancer Mother    Colon cancer Neg Hx    Esophageal cancer Neg  Hx    Rectal cancer Neg Hx    Stomach cancer Neg Hx     Past Surgical History:  Procedure Laterality Date   CATARACT EXTRACTION, BILATERAL     GANGLION CYST EXCISION  age 35 years   right   KNEE ARTHROSCOPY     right   KNEE ARTHROSCOPY     TOTAL KNEE ARTHROPLASTY Right 03/08/2020   Procedure: RIGHT TOTAL KNEE ARTHROPLASTY;  Surgeon: Garald Balding, MD;  Location: [facility];  Service: Orthopedics;  Laterality: Right;   WRIST FRACTURE SURGERY     WRIST SURGERY  04/2012   Social History   Occupational History   Occupation: Chief Financial Officer  Tobacco Use   Smoking status: Former Smoker    Packs/day: 1.50    Years: 15.00    Pack years: 22.50    Types: Cigarettes    Quit date: 12/17/1982    Years since quitting: 37.5   Smokeless tobacco: Never Used   Tobacco comment: over 40 yrs  Vaping Use   Vaping Use: Never used  Substance and Sexual Activity   Alcohol use: Yes    Comment: very rare   Drug use: No   Sexual activity: Yes    Partners: Female

## 2020-07-06 ENCOUNTER — Encounter: Payer: Self-pay | Admitting: Neurology

## 2020-07-06 ENCOUNTER — Other Ambulatory Visit: Payer: Self-pay

## 2020-07-06 ENCOUNTER — Ambulatory Visit (INDEPENDENT_AMBULATORY_CARE_PROVIDER_SITE_OTHER): Payer: Medicare Other | Admitting: Neurology

## 2020-07-06 VITALS — BP 110/72 | HR 99 | Ht 70.0 in | Wt 208.3 lb

## 2020-07-06 DIAGNOSIS — Z9181 History of falling: Secondary | ICD-10-CM

## 2020-07-06 DIAGNOSIS — G4752 REM sleep behavior disorder: Secondary | ICD-10-CM

## 2020-07-06 DIAGNOSIS — R531 Weakness: Secondary | ICD-10-CM | POA: Diagnosis not present

## 2020-07-06 DIAGNOSIS — R441 Visual hallucinations: Secondary | ICD-10-CM

## 2020-07-06 DIAGNOSIS — G20 Parkinson's disease: Secondary | ICD-10-CM

## 2020-07-06 LAB — ANAEROBIC AND AEROBIC CULTURE
AER RESULT:: NO GROWTH
GRAM STAIN:: NONE SEEN
MICRO NUMBER:: 10709593
MICRO NUMBER:: 10709594
SPECIMEN QUALITY:: ADEQUATE
SPECIMEN QUALITY:: ADEQUATE

## 2020-07-06 LAB — SYNOVIAL CELL COUNT + DIFF, W/ CRYSTALS
Basophils, %: 0 %
Eosinophils-Synovial: 0 % (ref 0–2)
Lymphocytes-Synovial Fld: 62 % (ref 0–74)
Monocyte/Macrophage: 10 % (ref 0–69)
Neutrophil, Synovial: 26 % — ABNORMAL HIGH (ref 0–24)
Synoviocytes, %: 2 % (ref 0–15)
WBC, Synovial: 883 cells/uL — ABNORMAL HIGH (ref <150)

## 2020-07-06 MED ORDER — CARBIDOPA-LEVODOPA 25-100 MG PO TABS: 1.0000 | ORAL_TABLET | Freq: Every day | ORAL | 3 refills | Status: DC

## 2020-07-06 MED ORDER — PRAMIPEXOLE DIHYDROCHLORIDE 0.5 MG PO TABS: 0.5000 mg | ORAL_TABLET | Freq: Three times a day (TID) | ORAL | 3 refills | Status: DC

## 2020-07-06 NOTE — Progress Notes (Signed)
Subjective:    Patient ID: Danny [name] is a 69 y.o. male.  HPI      Interim history:   Danny [name] is a very pleasant 69 year old right-handed gentleman with an underlying medical history of type 2 diabetes, osteoarthritis, status post right knee replacement in March 2021, hypertension, obesity, hyperlipidemia, and cataracts, who presents for follow-up consultation of his Parkinson's disease, Complicated by decline in mobility, history of IBD, memory loss, hallucinations, and recurrent falls.  The patient is accompanied by his wife again today.  I last saw him on 02/11/2020, at which time we talked about his medication regimen, recent fall, and he needed clearance for his right knee replacement.  He had an appointment in the interim with Debbora Presto, nurse practitioner on 05/23/2020, at which time Sinemet CR was added at bedtime and he was advised to start Nuplazid.  However, his wife reported back that Nuplazid was not started secondary to cost.  Today, 07/06/2020: He reports very little of his own history, his history is primarily provided by his wife.  She reports that he is declining and his cognitive function but also motor function.  He feels much more exhausted and easily fatigued.  He has fallen multiple times particularly in the month of June.  He chipped a bone in his knee.  He has had knee replacement surgery and was coming along fairly well.  He now has a brace for it as he is to immobilize the need to help heal the chipped bone.  He is supposed to have a MRI of the knee next month.  He is not really able to do anything by himself and she has to help him with most tasks.  Sometimes he is just more forgetful and confused.  He is often frustrated because of this, he does not sleep very well.  He has intermittent symptoms of REM behavior disorder but not that frequently.  They did get the 2-week starter pack for the Nuplazid but because he cannot afford the prescription long-term, they have  not open the box.  She feels that his hallucinations are stable.  He agrees that his visual hallucinations are not scary and not worse.  She is wondering if motor wise it would help to increase his Sinemet, he continues to take Mirapex 0.5 mg 3 times daily and Sinemet 1 pill 5 times a day.   The patient's allergies, current medications, family history, past medical history, past social history, past surgical history and problem list were reviewed and updated as appropriate.      Previously (copied from previous notes for reference):     I saw him on 10/05/2019, at which time he reported feeling tired during the day.  His wife endorsed that he was quite sleepy during the day, particularly in the early evenings.  He has had some falls.  He had bruised his sternum and ribs.  He did fall in his kitchen and hit his head against a curtain holder and had a small area of skin injury in the right forehead.  He was advised to continue with Sinemet 1 pill 5 times a day but advised to further reduce his Mirapex to 0.5 mg 3 times daily.  I asked him to have a head CT done.  He had a head CT without contrast on 10/08/2020 and I reviewed the results: IMPRESSION:    Normal CT head (without).  We called him with his test results.     I saw him on 06/04/2019,  at which time he reported difficulty with his mobility.  He had fallen, thankfully without major injuries.  His wife had noticed more forgetfulness and some confusion at times.  He was more sleepy during the day.  He was taking Sinemet 4 times a day and had more difficulty in the mornings.  He was on Mirapex 3 times a day.  He had intermittent constipation.  He reported fleeting hallucinations, nothing sustained or anxiety provoking.  He was advised to increase his Sinemet to 1 pill 5 times a day at 3 hourly intervals starting at 8 AM.  He was advised to continue with Mirapex 3 times daily.  He was advised to be very proactive about constipation issues.    I saw  him on 12/03/2018, at which time he was advised to continue with Sinemet 1 pill 4 times a day.  He did have her fall incident after he missed the chair when sitting down.  His memory was stable for the most part.  He was advised to hydrate well and be proactive about constipation issues, we talked about fall prevention.     I saw him on 06/05/2018, at which time he felt fairly stable but his wife had noticed more shuffling and also some mood irritability. He had no falls thankfully and some forgetfulness was noted. He was more sleepy during the day. Suggested he continue with generic Mirapex 0.75 mg 3 times a day but increase his Sinemet from 3 times a day to 1 pill 4 times a day.   His wife called in the interim in October 2019 requesting a letter to support their cancellation of their timeshare as he was not able to drive and they were not able to utilize their timeshare.    I saw him on 12/05/2017, at which time he felt fairly stable on Sinemet, we had reduced his Mirapex to 0.75 mg 3 times a day. He is trying to stay active. He had no recent falls. I suggested we continue with his Sinemet and Mirapex at the current doses.   I saw him on 09/09/2017 at which time he had been more sleepy during the day. His wife was worried that he was more preoccupied with sex. He had ongoing issues with intermittent double vision. He had intermittent and mild dream enactments. Suggested we gradually start him on low-dose Sinemet starting with half pill and gradually increasing this to 1 pill 3 times a day. His wife called in the interim in October reporting that he had done fairly well with Sinemet, at which time we reduced his Mirapex from 1 mg 3 times a day to 0.75 mg 3 times a day.      I saw him on 03/04/2017, at which time he felt stable. He retired in December 2017. The daughter was living with them. Mother-in-law had moved in as well. His wife retired also. He was going to the gym on a regular basis.  Memory-wise, he had mild forgetfulness, no mood issues, no sleep issues, no side effects from the Mirapex. He had no recent falls thankfully. He had some mild dream enactments but no major issues. Looking back, his symptoms of peak date back to 2014, he had a sleep study in early 2014 with PLMS noted but no OSA and he denies restless leg symptoms. We mutually agreed to taper him off of Azilect as it was too expensive. I suggested in lieu of the Azilect, we increase his pramipexole to 1 mg 3 times a day.  I saw him on 09/03/2016, at which time he reported doing well, no telltale differences after increasing the Mirapex. He gave up driving. He decided to retire by the end of December 2017. His mother-in-law was supposed to move in with them. His memory was stable, sleep was stable, no real mood issues, no recent falls. I suggested we keep his medications the same including Azilect 1 mg once daily, Mirapex 0.75 mg 3 times a day.   I saw him on 02/27/2016, at which time he reported more difficulty with his walking, he had become slower. Wife had noted that it would take him longer to do things. He was dragging his feet at times. He was not swinging his arms as well. He was working full-time as a Land, avoiding stairs and heights. His A1c in December 2016 was a little up at 7.6. Memory was stable, with some mild forgetfulness. Mood was stable, RBD was infrequent. He reported no recent falls. I suggested he continue with Azilect and we increased his Mirapex to 0.75 mg 3 times a day.    I saw him on 08/29/2015 at which time he reported doing fairly well. He was able to tolerate Azilect. His Mirapex was 0.5 mg 3 times a day. He was working at the computer most of the time. He was not always drinking enough water. He had a good appetite. He felt his memory was stable. RBD was less frequent but still occurring. His wife was able to sleep in the same bed with him. He had no recent falls. He had some  recent blurry vision was going to see his ophthalmologist. We mutually agreed to continue his current medication regimen.   I saw him on 04/25/2015, at which time he reported doing fairly well overall, but had noted smaller changes, including more slowness, difficulty with climbing ladders. He was advised by his boss to not climb ladders or walk over obstacles at work. Thankfully, his work environment is understanding. He had another business trip coming up and a colleague was to drive and do the climbing on the sites. He reported mild forgetfulness and some problems with depth perception. His diplopia had improved since he was given prism glasses by his ophthalmologist. I suggested, we try him on Azilect and keep his Mirapex the same.    I saw him on 02/04/2015, at which time he reported ongoing issues with diplopia, particularly at night while driving. He was still working full-time. He was still on low-dose Mirapex, 0.25 mg 3 times a day. He had no side effects. He had had a sleep study which per his report did not show any OSA. I asked him to make an appointment with his eye doctor. I suggested an increase in Mirapex to 0.5 mg 3 times a day.   I first met him on 12/28/2014, at which time he reported that he was having more fatigue. However, he also had reduce his caffeine intake. He had no recent falls and mood stable. He was working full-time. He reported that he had intermittent double vision in the last 3 months. His PCP requested a sooner than scheduled appointment for diplopia. His exam was stable at the time and I suggested no new medication changes.   He has had some short-term memory issues. He works full-time. He is an Chief Financial Officer. He likes square dance but his wife has noted that he has had more problems with his nighttime driving and his squared hands. In the recent 3 months he has  had some intermittent double vision at night. He has drooling at night. He rarely drinks alcohol and usually drinks  4-5 cups of coffee per day, occasional sodas. He quit smoking in 1978. Blood pressure is low for him today. It was recently noted to be trending lower and his blood pressure medication was reduced. He takes his cholesterol medication every other day.   He had a brain MRI without contrast on 11/04/2013: Normal MRI scan of the brain. Incidental findings of a chronic paranasal sinusitis with deviated nasal septum to the left.  He previously followed with Dr. Jim Like and was last seen by him on 08/30/2014, at which time his Mirapex was kept at 0.25 mg 3 times a day. He reported no side effects, in particular no impulse control disorder. I reviewed Dr. Hazle Quant notes. He first met Dr. Janann Colonel on 10/26/13, at which time the patient reported a right hand tremor noticed over the previous 4-6 months. He had changes in his handwriting, slowness, some stiffness and changes in his walking.  His wife reported a longer standing history of REM behavior disorder. He has been in outpatient physical therapy.  His Past Medical History Is Significant For: Past Medical History:  Diagnosis Date  . Allergy    SEASONAL  . Cataract    BILATERAL-REMOVED  . Essential hypertension, benign    PT.DENIES ON AS PREVENTIVE 08/17/19  . GERD (gastroesophageal reflux disease)   . Hx of adenomatous polyp of colon 09/04/2019   08/2019 diminutive adenoma No recall given findings and age  . Mixed hyperlipidemia    PT.DENIES,STATED ON AS PREVENTIVE 07/30/19  . Neuromuscular disorder (HCC)    PARKINSONS  . OA (osteoarthritis) of knee    right  . Obesity, unspecified   . Osteoarthritis    KNEE AND SHOULDERS  . Parkinsons ([place])   . Type II or unspecified type diabetes mellitus without mention of complication, not stated as uncontrolled     His Past Surgical History Is Significant For: Past Surgical History:  Procedure Laterality Date  . CATARACT EXTRACTION, BILATERAL    . GANGLION CYST EXCISION  age 35 years   right  . KNEE  ARTHROSCOPY     right  . KNEE ARTHROSCOPY    . TOTAL KNEE ARTHROPLASTY Right 03/08/2020   Procedure: RIGHT TOTAL KNEE ARTHROPLASTY;  Surgeon: Garald Balding, MD;  Location: [facility];  Service: Orthopedics;  Laterality: Right;  . WRIST FRACTURE SURGERY    . WRIST SURGERY  04/2012    His Family History Is Significant For: Family History  Problem Relation Age of Onset  . Diabetes Sister   . Diabetes Daughter   . Cancer Father   . Heart disease Father   . Colon polyps Son   . Diabetes Mother   . Lung cancer Mother   . Colon cancer Neg Hx   . Esophageal cancer Neg Hx   . Rectal cancer Neg Hx   . Stomach cancer Neg Hx     His Social History Is Significant For: Social History   Socioeconomic History  . Marital status: Married    Spouse name: [name]  . Number of children: 3  . Years of education: 16  . Highest education level: Bachelor's degree (e.g., BA, AB, BS)  Occupational History  . Occupation: Chief Financial Officer  Tobacco Use  . Smoking status: Former Smoker    Packs/day: 1.50    Years: 15.00    Pack years: 22.50    Types: Cigarettes    Quit  date: 12/17/1982    Years since quitting: 37.5  . Smokeless tobacco: Never Used  . Tobacco comment: over 40 yrs  Vaping Use  . Vaping Use: Never used  Substance and Sexual Activity  . Alcohol use: Yes    Comment: very rare  . Drug use: No  . Sexual activity: Yes    Partners: Female  Other Topics Concern  . Not on file  Social History Narrative   [name], motorcycle rider.  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  He has 3 adult children.  His wife had 2 children, one of whom is deceased.   Patient is working full-time.   Patient has a Bachelor's degree.   Patient is right-handed.   Patient drinks 6 cups of coffee daily.   Social Determinants of Health   Financial Resource Strain:   . Difficulty of Paying Living Expenses:   Food Insecurity:   . Worried About Charity fundraiser in the Last Year:   . Arboriculturist in the Last  Year:   Transportation Needs:   . Film/video editor (Medical):   Marland Kitchen Lack of Transportation (Non-Medical):   Physical Activity:   . Days of Exercise per Week:   . Minutes of Exercise per Session:   Stress:   . Feeling of Stress :   Social Connections:   . Frequency of Communication with Friends and Family:   . Frequency of Social Gatherings with Friends and Family:   . Attends Religious Services:   . Active Member of Clubs or Organizations:   . Attends Archivist Meetings:   Marland Kitchen Marital Status:     His Allergies Are:  Allergies  Allergen Reactions  . Codeine Nausea And Vomiting  :   His Current Medications Are:  Outpatient Encounter Medications as of 07/06/2020  Medication Sig  . Ascorbic Acid (VITAMIN C ADULT GUMMIES PO) Take 2 tablets by mouth daily.   Marland Kitchen aspirin 81 MG chewable tablet Chew 1 tablet (81 mg total) by mouth 2 (two) times daily.  . carbidopa-levodopa (SINEMET CR) 50-200 MG tablet Take 1 tablet by mouth at bedtime.  . carbidopa-levodopa (SINEMET IR) 25-100 MG tablet TAKE 1 TABLET BY MOUTH 5 (FIVE) TIMES DAILY.  Marland Kitchen Cinnamon 500 MG capsule Take 500 mg by mouth daily.  . famotidine (PEPCID) 20 MG tablet Take 20 mg by mouth in the morning and at bedtime.   . Ginger, Zingiber officinalis, 550 MG CAPS Take 550 mg by mouth daily.    Marland Kitchen glipiZIDE (GLUCOTROL XL) 10 MG 24 hr tablet TAKE 1 TABLET BY MOUTH DAILY (Patient taking differently: Take 10 mg by mouth at bedtime. )  . Glucosamine-Chondroit-Vit C-Mn (GLUCOSAMINE CHONDR 1500 COMPLX PO) Take 1,500 mg by mouth daily.    Marland Kitchen ketoconazole (NIZORAL) 2 % cream Apply 1 application topically 2 (two) times daily.   . metFORMIN (GLUCOPHAGE) 1000 MG tablet Take 1 tablet (1,000 mg total) by mouth 2 (two) times daily with a meal.  . Multiple Vitamin (MULTIVITAMIN) tablet Take 1 tablet by mouth daily.  . pramipexole (MIRAPEX) 0.5 MG tablet Take 1 tablet (0.5 mg total) by mouth 3 (three) times daily.  . [DISCONTINUED]  Pimavanserin Tartrate (NUPLAZID) 34 MG CAPS Take 1 capsule (34 mg total) by mouth daily.  . [DISCONTINUED] simvastatin (ZOCOR) 40 MG tablet TAKE 0.5 TABLETS (20 MG TOTAL) BY MOUTH DAILY. (Patient taking differently: Take 20 mg by mouth every other day. )   No facility-administered encounter medications on file  as of 07/06/2020.  :  Review of Systems:  Out of a complete 14 point review of systems, all are reviewed and negative with the exception of these symptoms as listed below:  Review of Systems  Neurological:       Here for f/u on PD. Reports decline since last visit. Vision and mobility has decreased recently. Hallucinations have improved. Pt has not been taking nuplazid.     Objective:  Neurological Exam  Physical Exam Physical Examination:   Vitals:   07/06/20 1259  BP: 110/72  Pulse: 99    General Examination: The patient is a very pleasant 69 y.o. male in no acute distress. He appears a little more frail and deconditioned today, in a wheelchair. Well groomed.   HEENT:Normocephalic, atraumatic, pupils are equal, round and reactive to light, extraocular trackingshows mild saccadic breakdown, no nystagmus, no significant limitation to downgaze, slight limitation to upper gaze. He has decrease in eye blink rate, status post bilateral cataract repairs, hearing is grossly intact, bilateral hearing aids in place.Prism eye glassesin place.Face is moderatelymasked, neck is moderately rigid, and he keeps his neck more flexed today.  Airway examination reveals mild mouth dryness, no significant airway crowding, Mallampati class II, no sialorrhea. Tongue protrudes centrally and palate elevates symmetrically. Moderatehypophonia,with milddysarthria noted.  Chest:Clear to auscultation without wheezing, rhonchi or crackles noted.  Heart:S1+S2+0, regular and normal without murmurs, rubs or gallops noted.   Abdomen:Soft, non-tender and non-distended with normal bowel sounds  appreciated on auscultation.  Extremities:There is nopitting edema in the distal lower extremities bilaterally.  Skin: Warm and dry without trophic changes noted.  Musculoskeletal: exam reveals R knee scar from recent surgery, well-healing.  He has mild swelling.  He is in a soft brace around the right knee.    Neurologically:  Mental status: The patient is awake, alert and oriented in all 4 spheres. Hisimmediate and remote memory, attention, language skills and fund of knowledge aremildly impaired, he does have more trouble giving his history today, expresses frustration about his limitations and becomes tearful at 1 point talking about the dance convention they are planning to go to neurology and the fact that he cannot dance himself any longer.There is no evidence of aphasia, agnosia, apraxia or anomia. Speechas above. Thought process is linear. Mood is normaland affect is normal otherwise.  On6/18/2020: MMSE: 25/30, CDT: 2/4, AFT: 14/min.  Cranial nerves II - XII are as described above under HEENT exam. Motor exam: Normal bulk, andstrengthis noted. He has mild increase in tone on the right side. Overall moderatebradykinesia is noted, he has an intermittent resting tremor in the right upper and right lower extremities.  He has slight intermittent dyskinesias in both lower extremities, more so on the right. Fine motor skills aremoderate to severely impaired on the right and mild to moderately impaired on the left.   Sensory exam is intact to light touch.  Cerebellar testing shows no dysmetria or intention tremor. Gait, station and balance: Heis in a wheelchair.  He has a brace over the right knee, I did not feel it was stable or safe for him to stand or walk for me as he is supposed to stabilize the knee until his chipped bone can heal.    Assessment and plan:   In summary, LENOX BINK a very pleasant 69 year old malewith anunderlying medical history of type 2  diabetes, osteoarthritis, hypertension, obesity, hyperlipidemia, cataracts withstatus post surgeries, recent status post right knee replacement surgery, recent falls, who presents for follow-up  consultation of his right-sided predominant Parkinson's disease,complicated by hallucinations, REM behavior disorder, daytime somnolence, memory loss, recurrent falls, and intermittent constipation.  His motor symptoms date back to around 5/14 and RBD symptoms preceding motor symptoms by perhaps 12-15 years even. He has donefairly well over the past years but he has had progressive decline in his motor function and balance as well as some dyskinesias and he has had fairly consistent hallucinations.  We talked at length today, particularly about the challenges of advancing Parkinson's disease.  He had home health therapy, he may be able to resume this after they have the follow-up with the orthopedic surgeon and the MRI on the knee.  His wife is encouraged to call us so we can refer him back to home health physical therapy. He has had some memory loss over time.  We will continue to monitor and maybe consider memory medication.  He is on Mirapex 0.5 mg 3 times daily and he is advised to take it at 8 AM, 1 PM and6 PM.  I suggested a cautious increase in his Sinemet to 1 pill 6 times a day, at 8 AM, 10:30 AM, 1 PM, 3:30 PM, 6 PM and 8:30 PM.  He is encouraged to continue with the long-acting Sinemet closer to bedtime at 930 or 10 PM. We can reconsider the Nuplazid in the future but for now we mutually agreed to hold off on it.  Cost is also a factor.  He was also on Azilect in the past but this was discontinued in March 2018 due to cost. We talked about the importance of fall prevention. I suggested a 49-monthfollow-up, sooner if needed.  I answered all their questions today and the patient and his wife were in agreement.I spent 40 minutes in total face-to-face time and in reviewing records during pre-charting, more than 50%  of which was spent in counseling and coordination of care, reviewing test results, reviewing medications and treatment regimen and/or in discussing or reviewing the diagnosis of PD, the prognosis and treatment options. Pertinent laboratory and imaging test results that were available during this visit with the patient were reviewed by me and considered in my medical decision making (see chart for details).

## 2020-07-06 NOTE — Patient Instructions (Signed)
We will await your MRI of the right knee and consider renewing home health physical therapy through your home health agency afterwards.  Please call our office if you would like to resume home health therapy.   As discussed, we will try to increase your Sinemet to 1 pill 6 times a day, namely at 8 AM, 10:30 AM, 1 PM, 3:30 PM, 6 PM and 8:30 PM.  You can continue to take your long-acting Sinemet 50-200 mg strength at bedtime, somewhere between 930 and 10.  We will hold off on utilizing the Nuplazid at this time, we can reconsider this in the future.

## 2020-07-25 ENCOUNTER — Ambulatory Visit
Admission: RE | Admit: 2020-07-25 | Discharge: 2020-07-25 | Disposition: A | Discharge status: Home / Self Care | Payer: Medicare Other | Source: Ambulatory Visit | Attending: Orthopaedic Surgery | Admitting: Orthopaedic Surgery

## 2020-07-25 DIAGNOSIS — Z96651 Presence of right artificial knee joint: Secondary | ICD-10-CM

## 2020-08-11 ENCOUNTER — Other Ambulatory Visit: Payer: Self-pay

## 2020-08-11 ENCOUNTER — Encounter: Payer: Self-pay | Admitting: Orthopaedic Surgery

## 2020-08-11 ENCOUNTER — Ambulatory Visit: Payer: Medicare Other | Admitting: Neurology

## 2020-08-11 ENCOUNTER — Ambulatory Visit (INDEPENDENT_AMBULATORY_CARE_PROVIDER_SITE_OTHER): Payer: Medicare Other | Admitting: Orthopaedic Surgery

## 2020-08-11 VITALS — Ht 70.0 in | Wt 208.0 lb

## 2020-08-11 DIAGNOSIS — M1711 Unilateral primary osteoarthritis, right knee: Secondary | ICD-10-CM

## 2020-08-11 DIAGNOSIS — M25561 Pain in right knee: Secondary | ICD-10-CM | POA: Diagnosis not present

## 2020-08-11 DIAGNOSIS — G8929 Other chronic pain: Secondary | ICD-10-CM | POA: Diagnosis not present

## 2020-08-11 DIAGNOSIS — G20 Parkinson's disease: Secondary | ICD-10-CM

## 2020-08-11 MED ORDER — LIDOCAINE HCL 1 % IJ SOLN
2.0000 mL | INTRAMUSCULAR | Status: AC | PRN
  Administered 2020-08-11: 2 mL

## 2020-08-11 MED ORDER — BUPIVACAINE HCL 0.5 % IJ SOLN
2.0000 mL | INTRAMUSCULAR | Status: AC | PRN
  Administered 2020-08-11: 2 mL via INTRA_ARTICULAR

## 2020-08-11 NOTE — Progress Notes (Signed)
Office Visit Note   Patient: Danny [name]           Date of Birth: Sep 01, 1951           MRN: [id number] Visit Date: 08/11/2020              Requested by: Danny [name], [place] [address],  [place] [address] PCP: Danny Mons, PA   Assessment & Plan: Visit Diagnoses:  1. Chronic pain of right knee   2. Unilateral primary osteoarthritis, right knee   3. Parkinson disease [facility])     Plan: [name] is accompanied by his wife and is 5 months status post primary right total knee replacement.  He has had some discomfort and some lack of motion recently that may have started after he had several falls either related to his knee or to his progressive Parkinson's.  He is being followed by the neurologist and according to his wife that his Parkinson's has progressed.  When he was here last visit I aspirated his knee with some bloody fluid.  This was sent to the lab and cultures were negative.  There were 883 white cells with 26 neutrophils and 62 lymphs.  I also ordered an MRI scan of his right knee as I was concerned that he may have a partial tear of the distal quadriceps related to all of his falls.  The scan did not demonstrate any pathology about the quadriceps other than some tendinosis.  The distal quadriceps was otherwise intact.  Did not appear to be any other abnormalities, specifically, no acute osseous findings or evidence of hardware loosening.  Today I reaspirated his knee of 45 cc with some blood and will send it to the lab as well. He does have an extensor lag and some discomfort when he walks.  We will try a spider brace to support his knee and will ask physical therapy to come to his home and start working on strengthening exercises.  I had also like Dr. Junius [name] to ultrasound his distal quad to see if this would supplement the MRI scan to be sure there is no quadriceps abnormality.  Check him back in a month.  In summary I think he has a number of issues  that could be contributing to his weakness including his diabetes and his progressive Parkinson's.  Had a number of falls and I suspect that may be partly responsible for the bloody effusion.  There is no evidence of infection.  His knee is otherwise stable  Follow-Up Instructions: Return in about 1 month (around 09/11/2020).   Orders:  Orders Placed This Encounter  Procedures  . Anaerobic and Aerobic Culture  . Synovial cell count + diff, w/ crystals  . Ambulatory referral to Orthopedic Surgery  . Ambulatory referral to Physical Therapy   No orders of the defined types were placed in this encounter.     Procedures: Large Joint Inj: R knee on 08/11/2020 5:04 PM Indications: pain and diagnostic evaluation Details: 25 G 1.5 in needle, anteromedial approach  Arthrogram: No  Medications: 2 mL lidocaine 1 %; 2 mL bupivacaine 0.5 % Aspirate: 45 mL blood-tinged; sent for lab analysis Outcome: tolerated well, no immediate complications Procedure, treatment alternatives, risks and benefits explained, specific risks discussed. Consent was given by the patient. Immediately prior to procedure a time out was called to verify the correct patient, procedure, equipment, support staff and site/side marked as required. Patient was prepped and draped in the usual  sterile fashion.       Clinical Data: No additional findings.   Subjective: Chief Complaint  Patient presents with  . Right Knee - Follow-up    MRI review  Patient presents today for follow up on his right knee. He had an MRI on 07/25/2020 and is here today for those results. No changes since last visit.  Not taking anything for pain.  Still experiencing considerable weakness in both of his lower extremities and possibly more on the right.  He has been going to the neurologist about every 3 to 4 months and according to Danny [name] his Parkinson's has become progressive.  Several falls that are contributing to some of his discomfort and  activity compromise  HPI  Review of Systems  Constitutional: Positive for fatigue.  HENT: Negative for ear pain.   Eyes: Negative for pain.  Respiratory: Negative for shortness of breath.   Cardiovascular: Negative for leg swelling.  Gastrointestinal: Negative for constipation and diarrhea.  Endocrine: Negative for cold intolerance and heat intolerance.  Genitourinary: Negative for difficulty urinating.  Musculoskeletal: Positive for joint swelling.  Skin: Negative for rash.  Allergic/Immunologic: Negative for food allergies.  Neurological: Positive for weakness.  Hematological: Does not bruise/bleed easily.  Psychiatric/Behavioral: Negative for sleep disturbance.     Objective: Vital Signs: Ht 5\' 10"  (1.778 m)   Wt 208 lb (94.3 kg)   BMI 29.84 kg/m   Physical Exam Constitutional:      Appearance: He is well-developed.  Eyes:     Pupils: Pupils are equal, round, and reactive to light.  Pulmonary:     Effort: Pulmonary effort is normal.  Skin:    General: Skin is warm and dry.  Neurological:     Mental Status: He is alert and oriented to person, place, and time.  Psychiatric:        Behavior: Behavior normal.     Ortho Exam awake alert and oriented x3.  Does have Parkinson's and some halting speech but appropriate.  Does have a positive effusion in the right knee.  He lacked about 10 degrees to full extension passively and had about a 22 degree extensor lag.  No opening with varus valgus stress.  Knee was slightly warm probably related to the effusion no popliteal mass.  No calf pain.  No pain with range of motion of his knee Specialty Comments:  No specialty comments available.  Imaging: MR Knee Right w/o contrast  Result Date: 07/25/2020 CLINICAL DATA:  Right knee pain since falling onto knee a few weeks ago. History of right knee replacement 03/08/2020. Evaluate for or quadriceps mechanism injury. EXAM: MRI OF THE RIGHT KNEE WITHOUT CONTRAST TECHNIQUE: Multiplanar,  multisequence MR imaging of the knee was performed. Metal artifact reduction sequences were utilized. No intravenous contrast was administered. COMPARISON:  Radiographs 06/30/2020. FINDINGS: Despite use of metal artifact reduction sequences, there is significant postsurgical susceptibility artifact. MENISCI Medial meniscus:  Total knee arthroplasty. Lateral meniscus:  Total knee arthroplasty. LIGAMENTS Cruciates:  Total knee arthroplasty. Collaterals:  Intact. CARTILAGE Patellofemoral:  Total knee arthroplasty. Medial:  Total knee arthroplasty. Lateral:  Total knee arthroplasty. MISCELLANEOUS Joint:  Moderate-sized joint effusion. Popliteal Fossa:  Unremarkable. No significant Baker's cyst. Extensor Mechanism: There is distal quadriceps tendinosis with associated postsurgical susceptibility artifact. No full-thickness tear or tendon retraction identified. The patellar tendon is partly obscured by artifact proximally, although appears intact. There is no tendon laxity. Bones: No evidence of acute fracture, dislocation or hardware loosening. Postsurgical changes in the distal  femoral medullary canal. Other: Mild subcutaneous edema anteriorly without focal fluid collection. IMPRESSION: 1. Limited examination due to postsurgical susceptibility artifact from total knee arthroplasty. No acute osseous findings or evidence of hardware loosening. 2. Distal quadriceps tendinosis without full-thickness tear or tendon retraction. 3. Moderate-sized joint effusion. Electronically Signed   By: Richardean Sale M.D.   On: 07/25/2020 15:21    PMFS History: Patient Active Problem List   Diagnosis Date Noted  . Hemarthrosis of right knee 06/30/2020  . Heterotopic calcification, postoperative 06/30/2020  . Total knee replacement status, right 03/22/2020  . Hematospermia 11/18/2015  . Epididymal cyst 05/25/2015  . Parkinson disease ([place]) 01/14/2014  . Obesity (BMI 30-39.9) 07/14/2013  . Confusional arousals 02/13/2013  .  Disorder of ligament of right wrist 01/29/2012  . Diabetes mellitus type 2, uncomplicated ([place]) 71/69/6789  . Mixed hyperlipidemia 10/09/2011  . Unilateral primary osteoarthritis, right knee 10/09/2011  . Essential hypertension, benign 10/09/2011   Past Medical History:  Diagnosis Date  . Allergy    SEASONAL  . Cataract    BILATERAL-REMOVED  . Essential hypertension, benign    PT.DENIES ON AS PREVENTIVE 08/17/19  . GERD (gastroesophageal reflux disease)   . Hx of adenomatous polyp of colon 09/04/2019   08/2019 diminutive adenoma No recall given findings and age  . Mixed hyperlipidemia    PT.DENIES,STATED ON AS PREVENTIVE 07/30/19  . Neuromuscular disorder (HCC)    PARKINSONS  . OA (osteoarthritis) of knee    right  . Obesity, unspecified   . Osteoarthritis    KNEE AND SHOULDERS  . Parkinsons ([place])   . Type II or unspecified type diabetes mellitus without mention of complication, not stated as uncontrolled     Family History  Problem Relation Age of Onset  . Diabetes Sister   . Diabetes Daughter   . Cancer Father   . Heart disease Father   . Colon polyps Son   . Diabetes Mother   . Lung cancer Mother   . Colon cancer Neg Hx   . Esophageal cancer Neg Hx   . Rectal cancer Neg Hx   . Stomach cancer Neg Hx     Past Surgical History:  Procedure Laterality Date  . CATARACT EXTRACTION, BILATERAL    . GANGLION CYST EXCISION  age 30 years   right  . KNEE ARTHROSCOPY     right  . KNEE ARTHROSCOPY    . TOTAL KNEE ARTHROPLASTY Right 03/08/2020   Procedure: RIGHT TOTAL KNEE ARTHROPLASTY;  Surgeon: Garald Balding, MD;  Location: [facility];  Service: Orthopedics;  Laterality: Right;  . WRIST FRACTURE SURGERY    . WRIST SURGERY  04/2012   Social History   Occupational History  . Occupation: Chief Financial Officer  Tobacco Use  . Smoking status: Former Smoker    Packs/day: 1.50    Years: 15.00    Pack years: 22.50    Types: Cigarettes    Quit date: 12/17/1982    Years since quitting: 37.6   . Smokeless tobacco: Never Used  . Tobacco comment: over 40 yrs  Vaping Use  . Vaping Use: Never used  Substance and Sexual Activity  . Alcohol use: Yes    Comment: very rare  . Drug use: No  . Sexual activity: Yes    Partners: Female

## 2020-08-17 ENCOUNTER — Ambulatory Visit: Payer: Self-pay

## 2020-08-17 ENCOUNTER — Ambulatory Visit (INDEPENDENT_AMBULATORY_CARE_PROVIDER_SITE_OTHER): Payer: Medicare Other | Admitting: Family Medicine

## 2020-08-17 ENCOUNTER — Other Ambulatory Visit: Payer: Self-pay

## 2020-08-17 ENCOUNTER — Encounter: Payer: Self-pay | Admitting: Family Medicine

## 2020-08-17 DIAGNOSIS — G8929 Other chronic pain: Secondary | ICD-10-CM

## 2020-08-17 DIAGNOSIS — M25561 Pain in right knee: Secondary | ICD-10-CM

## 2020-08-17 LAB — SYNOVIAL CELL COUNT + DIFF, W/ CRYSTALS
Basophils, %: 0 %
Eosinophils-Synovial: 0 % (ref 0–2)
Lymphocytes-Synovial Fld: 78 % — ABNORMAL HIGH (ref 0–74)
Monocyte/Macrophage: 15 % (ref 0–69)
Neutrophil, Synovial: 7 % (ref 0–24)
Synoviocytes, %: 0 % (ref 0–15)
WBC, Synovial: 1361 cells/uL — ABNORMAL HIGH (ref <150)

## 2020-08-17 LAB — ANAEROBIC AND AEROBIC CULTURE
AER RESULT:: NO GROWTH
MICRO NUMBER:: 10876619
MICRO NUMBER:: 10876620
SPECIMEN QUALITY:: ADEQUATE
SPECIMEN QUALITY:: ADEQUATE

## 2020-08-17 NOTE — Progress Notes (Signed)
Office Visit Note   Patient: Danny [name]           Date of Birth: 1951/08/22           MRN: [id number] Visit Date: 08/17/2020 Requested by: [name], [place] [place],  [place] [address] PCP: [name], PA  Subjective: Chief Complaint  Patient presents with   Right Knee - Pain    HPI: Patient is a 69yo M presenting to clinic for R knee [place]. He has a history of total knee replacement in March2021, and unfortunately sustained several falls shortly after surgery (was struggling with dosing of his parkinson's medications). He is being sent by Dr Durward Fortes for ultrasound today to confirm Quadricept tendon integrity.  Patient states that he has pain throughout the distal aspect of his anterior thigh, and feels as though his right leg is much weaker than the left. He has not engaged with physical therapy, as he has had difficulty finding providers that are willing to come out to his home.               ROS:   All other systems were reviewed and are negative.  Objective: Vital Signs: There were no vitals taken for this visit.  Physical Exam:  General:  Alert and oriented, in no acute distress. Pulm:  Breathing unlabored. Psy:  Normal mood, congruent affect. Skin:  Well healed linear surgical incision across midline knee  Knee:  Moderate effusion. Tenderness to palpation throughout distal aspect of quad, though no palpable defects appreciated.   Imaging: Ultrasound: Right Quadricepts Quadricepts tendon visualized in long and short access. Increased vascularity across tendon body, with significant swelling. Medial aspect with hyperechoic irregularities consistent with ossification of tendon.  Several hyperechoic linear structures visible within the muscle consistent with suture.   Large suprapatellar effusion.  Impression: Quadricepts tendon intact, with evidence of tendonitis and ossification. No obvious tears appreciated.   Assessment &  Plan: 69yo M presenting to clinic for knee ultrasound to confirm integrity of quadricepts tendon due to artifactual disruption on MRI following knee replacement. On Ultrasound, tendon appears intact, through with evidence of significant tendonitis as well as ossification (most noted over the medial aspect).  - Discussed with patient's surgeon, who met with patient and his wife to discuss their barriers to physical therapy. - Encouraged to participate with PT for quadricepts strengthening, knee mobilization exercises.      Procedures: No procedures performed  No notes on file     PMFS History: Patient Active Problem List   Diagnosis Date Noted   Hemarthrosis of right knee 06/30/2020   Heterotopic calcification, postoperative 06/30/2020   Total knee replacement status, right 03/22/2020   Hematospermia 11/18/2015   Epididymal cyst 05/25/2015   Parkinson disease ([place]) 01/14/2014   Obesity (BMI 30-39.9) 07/14/2013   Confusional arousals 02/13/2013   Disorder of ligament of right wrist 01/29/2012   Diabetes mellitus type 2, uncomplicated ([place]) 69/79/4801   Mixed hyperlipidemia 10/09/2011   Unilateral primary osteoarthritis, right knee 10/09/2011   Essential hypertension, benign 10/09/2011   Past Medical History:  Diagnosis Date   Allergy    SEASONAL   Cataract    BILATERAL-REMOVED   Essential hypertension, benign    PT.DENIES ON AS PREVENTIVE 08/17/19   GERD (gastroesophageal reflux disease)    Hx of adenomatous polyp of colon 09/04/2019   08/2019 diminutive adenoma No recall given findings and age   Mixed hyperlipidemia    PT.DENIES,STATED ON AS PREVENTIVE  07/30/19   Neuromuscular disorder (HCC)    PARKINSONS   OA (osteoarthritis) of knee    right   Obesity, unspecified    Osteoarthritis    KNEE AND SHOULDERS   Parkinsons ([place])    Type II or unspecified type diabetes mellitus without mention of complication, not stated as uncontrolled     Family  History  Problem Relation Age of Onset   Diabetes Sister    Diabetes Daughter    Cancer Father    Heart disease Father    Colon polyps Son    Diabetes Mother    Lung cancer Mother    Colon cancer Neg Hx    Esophageal cancer Neg Hx    Rectal cancer Neg Hx    Stomach cancer Neg Hx     Past Surgical History:  Procedure Laterality Date   CATARACT EXTRACTION, BILATERAL     GANGLION CYST EXCISION  age 89 years   right   KNEE ARTHROSCOPY     right   KNEE ARTHROSCOPY     TOTAL KNEE ARTHROPLASTY Right 03/08/2020   Procedure: RIGHT TOTAL KNEE ARTHROPLASTY;  Surgeon: Garald Balding, MD;  Location: [facility];  Service: Orthopedics;  Laterality: Right;   WRIST FRACTURE SURGERY     WRIST SURGERY  04/2012   Social History   Occupational History   Occupation: Chief Financial Officer  Tobacco Use   Smoking status: Former Smoker    Packs/day: 1.50    Years: 15.00    Pack years: 22.50    Types: Cigarettes    Quit date: 12/17/1982    Years since quitting: 37.6   Smokeless tobacco: Never Used   Tobacco comment: over 40 yrs  Vaping Use   Vaping Use: Never used  Substance and Sexual Activity   Alcohol use: Yes    Comment: very rare   Drug use: No   Sexual activity: Yes    Partners: Female

## 2020-08-17 NOTE — Progress Notes (Signed)
I saw and examined the patient with Dr. Elouise Munroe and agree with assessment and plan as outlined.    Findings discussed with Dr. Durward Fortes.

## 2020-08-25 ENCOUNTER — Telehealth: Payer: Self-pay

## 2020-08-25 NOTE — Telephone Encounter (Signed)
Danny [name] from [facility] called patient fell yesterday he denied any injuries and vitals are normal his wife mentioned he has had low blood sugar are going with seeing PCP. Call [phone].

## 2020-08-25 NOTE — Telephone Encounter (Signed)
I think this is just an FYI.

## 2020-08-25 NOTE — Telephone Encounter (Signed)
thanks

## 2020-09-14 ENCOUNTER — Telehealth: Payer: Self-pay | Admitting: Neurology

## 2020-09-14 NOTE — Telephone Encounter (Signed)
Danny [name] Patient Access Clair Gulling) called, Pt did not refill the prescription for  Nuplaxid. Should Pt continue on  Danny [name]. Would like a call from the nurse.

## 2020-09-14 NOTE — Telephone Encounter (Signed)
Due to cost pt could not continue on Nuplazid.  I have updated Danny [name] of this. Danny [name] sts financial aid and grants maybe available to the pt and he will have one of his pt care coordinators call to discuss. Danny [name] he will call back if the pt would like to pursue one of the copy/grant assist.

## 2020-09-29 ENCOUNTER — Telehealth: Payer: Self-pay | Admitting: Orthopaedic Surgery

## 2020-09-29 NOTE — Telephone Encounter (Signed)
It seems like this might just be an [place].

## 2020-09-29 NOTE — Telephone Encounter (Signed)
thanks

## 2020-09-29 NOTE — Telephone Encounter (Signed)
Received call from Margaretha Sheffield (PT) with [facility] advised patient fell in his bathroom yesterday. Per Margaretha Sheffield patient did not get injured. The number to contact Margaretha Sheffield or the Clinical nurse Manager Carlyon Shadow is [phone]

## 2020-10-11 ENCOUNTER — Telehealth: Payer: Self-pay | Admitting: Neurology

## 2020-10-11 ENCOUNTER — Encounter: Payer: Self-pay | Admitting: Neurology

## 2020-10-11 ENCOUNTER — Ambulatory Visit (INDEPENDENT_AMBULATORY_CARE_PROVIDER_SITE_OTHER): Payer: Medicare Other | Admitting: Neurology

## 2020-10-11 VITALS — BP 124/86 | HR 72

## 2020-10-11 DIAGNOSIS — R531 Weakness: Secondary | ICD-10-CM | POA: Diagnosis not present

## 2020-10-11 DIAGNOSIS — R413 Other amnesia: Secondary | ICD-10-CM | POA: Diagnosis not present

## 2020-10-11 DIAGNOSIS — R441 Visual hallucinations: Secondary | ICD-10-CM | POA: Diagnosis not present

## 2020-10-11 DIAGNOSIS — G20 Parkinson's disease: Secondary | ICD-10-CM

## 2020-10-11 MED ORDER — NOURIANZ 20 MG PO TABS: 20.0000 mg | ORAL_TABLET | Freq: Every day | ORAL | 3 refills | Status: DC

## 2020-10-11 NOTE — Patient Instructions (Addendum)
So, this is what we discussed last time:  Instructions from last visit: << We will try to increase your Sinemet to 1 pill 6 times a day, namely at 8 AM, 10:30 AM, 1 PM, 3:30 PM, 6 PM and 8:30 PM.  You can continue to take your long-acting Sinemet 50-200 mg strength at bedtime, somewhere between 930 and 10.  We will hold off on utilizing the Nuplazid at this time, we can reconsider this in the future.>>  Lets increase the Sinemet as discussed to 1 pill 6 times a day as above.  We will start you on Nourianz 20 mg daily for off times.  You can take it once a day, side effects may include involuntary movements called dyskinesia, constipation, Nausea, dizziness, decreased appetite, rare side effects may include rash, diarrhea and hallucinations.   I am reluctant to increase the Sinemet any further for now because of low blood pressure values you have had.  Please follow-up in 3 months, sooner if needed.

## 2020-10-11 NOTE — Telephone Encounter (Signed)
Noted. Dr. Rexene Alberts increased sinemet 25-100mg  to 6 tablets today. I would expect his current sinemet bottle to say 5 times daily.

## 2020-10-11 NOTE — Telephone Encounter (Signed)
[name],Danny(wife on DPR) states on pt's bottle for carbidopa-levodopa (SINEMET IR) 25-100 MG tablet it says 5 times a day not 6. 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

## 2020-10-11 NOTE — Progress Notes (Signed)
Subjective:    Patient ID: FARHAAN MABEE is a 69 y.o. male.  HPI     Interim history:   Mr. Ceci is a very pleasant 69 year old right-handed gentleman with an underlying medical history of type 2 diabetes, osteoarthritis, status post right knee replacement in March 2021, hypertension, obesity, hyperlipidemia, and cataracts, who presents for follow-up consultation of his Parkinson's disease, Complicated by decline in mobility, knee pain on the R, history of IBD, memory loss, hallucinations, and recurrent falls.  The patient is accompanied by his wife again today.  I last saw him on 07/06/2020, at which time he had more difficulty relating his own history and he had more decline in his motor function.  He had fallen multiple times.  He had knee replacement surgery.  They could not afford the prescription for Nuplazid long-term.  He was advised to increase his Sinemet to 1 pill 6 times a day and continue with the Sinemet CR at bedtime.  He had stable hallucinations.  He continues to take low-dose Mirapex 0.5 mg 3 times daily.  Today, 10/11/20: He reports  feeling worse, he has lack of stamina, he barely can walk with his walker.  He is mostly in a wheelchair.  His wife reports that he has had a significant decline in his motor function, has a lot more freezing and off time.  They did not actually increase the Sinemet to 6 times a day as discussed last time, this was misunderstood by his wife.  He recently saw his orthopedic surgeon, Dr. Durward Fortes on 08/11/2020 and I reviewed the note. He has not had any recent falls.  He has lost quite a bit of weight.  Primary care PA has scheduled some tests including a chest x-ray which was negative for any cancerous findings.  He does have a family history of colon cancer in his father and mom had lung cancer.  He has not had a recent colonoscopy but had a stool sample.  His appetite is actually good, blood pressure values have been low, sometimes in the 80s over  50s even.  He is drinking plenty of water with a electrolyte supplement in it.  The patient's allergies, current medications, family history, past medical history, past social history, past surgical history and problem list were reviewed and updated as appropriate.      Previously (copied from previous notes for reference):    I saw him on 02/11/2020, at which time we talked about his medication regimen, recent fall, and he needed clearance for his right knee replacement.   He had an appointment in the interim with Debbora Presto, nurse practitioner on 05/23/2020, at which time Sinemet CR was added at bedtime and he was advised to start Nuplazid.   However, his wife reported back that Nuplazid was not started secondary to cost.         I saw him on 10/05/2019, at which time he reported feeling tired during the day.  His wife endorsed that he was quite sleepy during the day, particularly in the early evenings.  He has had some falls.  He had bruised his sternum and ribs.  He did fall in his kitchen and hit his head against a curtain holder and had a small area of skin injury in the right forehead.  He was advised to continue with Sinemet 1 pill 5 times a day but advised to further reduce his Mirapex to 0.5 mg 3 times daily.  I asked him to have a head  CT done.  He had a head CT without contrast on 10/08/2020 and I reviewed the results: IMPRESSION:    Normal CT head (without).  We called him with his test results.     I saw him on 06/04/2019, at which time he reported difficulty with his mobility.  He had fallen, thankfully without major injuries.  His wife had noticed more forgetfulness and some confusion at times.  He was more sleepy during the day.  He was taking Sinemet 4 times a day and had more difficulty in the mornings.  He was on Mirapex 3 times a day.  He had intermittent constipation.  He reported fleeting hallucinations, nothing sustained or anxiety provoking.  He was advised to increase his  Sinemet to 1 pill 5 times a day at 3 hourly intervals starting at 8 AM.  He was advised to continue with Mirapex 3 times daily.  He was advised to be very proactive about constipation issues.    I saw him on 12/03/2018, at which time he was advised to continue with Sinemet 1 pill 4 times a day.  He did have her fall incident after he missed the chair when sitting down.  His memory was stable for the most part.  He was advised to hydrate well and be proactive about constipation issues, we talked about fall prevention.     I saw him on 06/05/2018, at which time he felt fairly stable but his wife had noticed more shuffling and also some mood irritability. He had no falls thankfully and some forgetfulness was noted. He was more sleepy during the day. Suggested he continue with generic Mirapex 0.75 mg 3 times a day but increase his Sinemet from 3 times a day to 1 pill 4 times a day.   His wife called in the interim in October 2019 requesting a letter to support their cancellation of their timeshare as he was not able to drive and they were not able to utilize their timeshare.    I saw him on 12/05/2017, at which time he felt fairly stable on Sinemet, we had reduced his Mirapex to 0.75 mg 3 times a day. He is trying to stay active. He had no recent falls. I suggested we continue with his Sinemet and Mirapex at the current doses.   I saw him on 09/09/2017 at which time he had been more sleepy during the day. His wife was worried that he was more preoccupied with sex. He had ongoing issues with intermittent double vision. He had intermittent and mild dream enactments. Suggested we gradually start him on low-dose Sinemet starting with half pill and gradually increasing this to 1 pill 3 times a day. His wife called in the interim in October reporting that he had done fairly well with Sinemet, at which time we reduced his Mirapex from 1 mg 3 times a day to 0.75 mg 3 times a day.      I saw him on 03/04/2017, at  which time he felt stable. He retired in December 2017. The daughter was living with them. Mother-in-law had moved in as well. His wife retired also. He was going to the gym on a regular basis. Memory-wise, he had mild forgetfulness, no mood issues, no sleep issues, no side effects from the Mirapex. He had no recent falls thankfully. He had some mild dream enactments but no major issues. Looking back, his symptoms of peak date back to 2014, he had a sleep study in early 2014 with PLMS  noted but no OSA and he denies restless leg symptoms. We mutually agreed to taper him off of Azilect as it was too expensive. I suggested in lieu of the Azilect, we increase his pramipexole to 1 mg 3 times a day.     I saw him on 09/03/2016, at which time he reported doing well, no telltale differences after increasing the Mirapex. He gave up driving. He decided to retire by the end of December 2017. His mother-in-law was supposed to move in with them. His memory was stable, sleep was stable, no real mood issues, no recent falls. I suggested we keep his medications the same including Azilect 1 mg once daily, Mirapex 0.75 mg 3 times a day.   I saw him on 02/27/2016, at which time he reported more difficulty with his walking, he had become slower. Wife had noted that it would take him longer to do things. He was dragging his feet at times. He was not swinging his arms as well. He was working full-time as a Land, avoiding stairs and heights. His A1c in December 2016 was a little up at 7.6. Memory was stable, with some mild forgetfulness. Mood was stable, RBD was infrequent. He reported no recent falls. I suggested he continue with Azilect and we increased his Mirapex to 0.75 mg 3 times a day.    I saw him on 08/29/2015 at which time he reported doing fairly well. He was able to tolerate Azilect. His Mirapex was 0.5 mg 3 times a day. He was working at the computer most of the time. He was not always drinking enough  water. He had a good appetite. He felt his memory was stable. RBD was less frequent but still occurring. His wife was able to sleep in the same bed with him. He had no recent falls. He had some recent blurry vision was going to see his ophthalmologist. We mutually agreed to continue his current medication regimen.   I saw him on 04/25/2015, at which time he reported doing fairly well overall, but had noted smaller changes, including more slowness, difficulty with climbing ladders. He was advised by his boss to not climb ladders or walk over obstacles at work. Thankfully, his work environment is understanding. He had another business trip coming up and a colleague was to drive and do the climbing on the sites. He reported mild forgetfulness and some problems with depth perception. His diplopia had improved since he was given prism glasses by his ophthalmologist. I suggested, we try him on Azilect and keep his Mirapex the same.    I saw him on 02/04/2015, at which time he reported ongoing issues with diplopia, particularly at night while driving. He was still working full-time. He was still on low-dose Mirapex, 0.25 mg 3 times a day. He had no side effects. He had had a sleep study which per his report did not show any OSA. I asked him to make an appointment with his eye doctor. I suggested an increase in Mirapex to 0.5 mg 3 times a day.   I first met him on 12/28/2014, at which time he reported that he was having more fatigue. However, he also had reduce his caffeine intake. He had no recent falls and mood stable. He was working full-time. He reported that he had intermittent double vision in the last 3 months. His PCP requested a sooner than scheduled appointment for diplopia. His exam was stable at the time and I suggested no new medication changes.  He has had some short-term memory issues. He works full-time. He is an Chief Financial Officer. He likes square dance but his wife has noted that he has had more problems  with his nighttime driving and his squared hands. In the recent 3 months he has had some intermittent double vision at night. He has drooling at night. He rarely drinks alcohol and usually drinks 4-5 cups of coffee per day, occasional sodas. He quit smoking in 1978. Blood pressure is low for him today. It was recently noted to be trending lower and his blood pressure medication was reduced. He takes his cholesterol medication every other day.   He had a brain MRI without contrast on 11/04/2013: Normal MRI scan of the brain. Incidental findings of a chronic paranasal sinusitis with deviated nasal septum to the left.  He previously followed with Dr. Jim Like and was last seen by him on 08/30/2014, at which time his Mirapex was kept at 0.25 mg 3 times a day. He reported no side effects, in particular no impulse control disorder. I reviewed Dr. Hazle Quant notes. He first met Dr. Janann Colonel on 10/26/13, at which time the patient reported a right hand tremor noticed over the previous 4-6 months. He had changes in his handwriting, slowness, some stiffness and changes in his walking.  His wife reported a longer standing history of REM behavior disorder. He has been in outpatient physical therapy.   His Past Medical History Is Significant For: Past Medical History:  Diagnosis Date   Allergy    SEASONAL   Cataract    BILATERAL-REMOVED   Essential hypertension, benign    PT.DENIES ON AS PREVENTIVE 08/17/19   GERD (gastroesophageal reflux disease)    Hx of adenomatous polyp of colon 09/04/2019   08/2019 diminutive adenoma No recall given findings and age   Mixed hyperlipidemia    PT.DENIES,STATED ON AS PREVENTIVE 07/30/19   Neuromuscular disorder (HCC)    PARKINSONS   OA (osteoarthritis) of knee    right   Obesity, unspecified    Osteoarthritis    KNEE AND SHOULDERS   Parkinsons ([place])    Type II or unspecified type diabetes mellitus without mention of complication, not stated as uncontrolled      His Past Surgical History Is Significant For: Past Surgical History:  Procedure Laterality Date   CATARACT EXTRACTION, BILATERAL     GANGLION CYST EXCISION  age 17 years   right   KNEE ARTHROSCOPY     right   KNEE ARTHROSCOPY     TOTAL KNEE ARTHROPLASTY Right 03/08/2020   Procedure: RIGHT TOTAL KNEE ARTHROPLASTY;  Surgeon: Garald Balding, MD;  Location: [facility];  Service: Orthopedics;  Laterality: Right;   WRIST FRACTURE SURGERY     WRIST SURGERY  04/2012    His Family History Is Significant For: Family History  Problem Relation Age of Onset   Diabetes Sister    Diabetes Daughter    Cancer Father    Heart disease Father    Colon polyps Son    Diabetes Mother    Lung cancer Mother    Colon cancer Neg Hx    Esophageal cancer Neg Hx    Rectal cancer Neg Hx    Stomach cancer Neg Hx     His Social History Is Significant For: Social History   Socioeconomic History   Marital status: Married    Spouse name: [name]   Number of children: 3   Years of education: 16   Highest education level: Bachelor's degree (  e.g., BA, AB, BS)  Occupational History   Occupation: Chief Financial Officer  Tobacco Use   Smoking status: Former Smoker    Packs/day: 1.50    Years: 15.00    Pack years: 22.50    Types: Cigarettes    Quit date: 12/17/1982    Years since quitting: 37.8   Smokeless tobacco: Never Used   Tobacco comment: over 40 yrs  Vaping Use   Vaping Use: Never used  Substance and Sexual Activity   Alcohol use: Yes    Comment: very rare   Drug use: No   Sexual activity: Yes    Partners: Female  Other Topics Concern   Not on file  Social History Narrative   Research officer, trade union, motorcycle rider.  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  He has 3 adult children.  His wife had 2 children, one of whom is deceased.   Patient is working full-time.   Patient has a Bachelor's degree.   Patient is right-handed.   Patient drinks 6 cups of coffee daily.   Social  Determinants of Health   Financial Resource Strain:    Difficulty of Paying Living Expenses: Not on file  Food Insecurity:    Worried About Charity fundraiser in the Last Year: Not on file   [employer] of Food in the Last Year: Not on file  Transportation Needs:    Lack of Transportation (Medical): Not on file   Lack of Transportation (Non-Medical): Not on file  Physical Activity:    Days of Exercise per Week: Not on file   Minutes of Exercise per Session: Not on file  Stress:    Feeling of Stress : Not on file  Social Connections:    Frequency of Communication with Friends and Family: Not on file   Frequency of Social Gatherings with Friends and Family: Not on file   Attends Religious Services: Not on file   Active Member of Clubs or Organizations: Not on file   Attends Archivist Meetings: Not on file   Marital Status: Not on file    His Allergies Are:  Allergies  Allergen Reactions   Codeine Nausea And Vomiting  :   His Current Medications Are:  Outpatient Encounter Medications as of 10/11/2020  Medication Sig   Ascorbic Acid (VITAMIN C ADULT GUMMIES PO) Take 2 tablets by mouth daily.    aspirin 81 MG chewable tablet Chew 1 tablet (81 mg total) by mouth 2 (two) times daily.   carbidopa-levodopa (SINEMET CR) 50-200 MG tablet Take 1 tablet by mouth at bedtime.   carbidopa-levodopa (SINEMET IR) 25-100 MG tablet Take 1 tablet by mouth 6 (six) times daily.   Cinnamon 500 MG capsule Take 500 mg by mouth daily.   famotidine (PEPCID) 20 MG tablet Take 20 mg by mouth in the morning and at bedtime.    Ginger, Zingiber officinalis, 550 MG CAPS Take 550 mg by mouth daily.     glipiZIDE (GLUCOTROL XL) 10 MG 24 hr tablet TAKE 1 TABLET BY MOUTH DAILY (Patient taking differently: Take 10 mg by mouth at bedtime. )   Glucosamine-Chondroit-Vit C-Mn (GLUCOSAMINE CHONDR 1500 COMPLX PO) Take 1,500 mg by mouth daily.     ketoconazole (NIZORAL) 2 % cream Apply 1  application topically 2 (two) times daily.    metFORMIN (GLUCOPHAGE) 1000 MG tablet Take 1 tablet (1,000 mg total) by mouth 2 (two) times daily with a meal.   Multiple Vitamin (MULTIVITAMIN) tablet Take 1 tablet by mouth daily.  pramipexole (MIRAPEX) 0.5 MG tablet Take 1 tablet (0.5 mg total) by mouth 3 (three) times daily. At 8, 1 PM and 6 PM   Istradefylline (NOURIANZ) 20 MG TABS Take 20 mg by mouth daily.   No facility-administered encounter medications on file as of 10/11/2020.  :  Review of Systems:  Out of a complete 14 point review of systems, all are reviewed and negative with the exception of these symptoms as listed below: Review of Systems  Neurological:       Pt presents today for follow up on PD. Pt reports that he has been "off and on."     Objective:  Neurological Exam  Physical Exam Physical Examination:   Vitals:   10/11/20 1300  BP: 124/86  Pulse: 72   General Examination: The patient is a very pleasant 69 y.o. male in no acute distress. He appears more frail and deconditioned, in his wheelchair.  He is well-groomed.  Good interaction.  HEENT:Normocephalic, atraumatic, pupils are equal, round and reactive to light, extraocular trackingshows mild saccadic breakdown, no nystagmus, no significant limitation to downgaze, slight limitation to upper gaze. He hasdecrease in eye blink rate, status post bilateral cataract repairs, hearing is grossly intact, bilateral hearing aids in place.Prism eye glassesin place.Face is moderatelymasked, neck is moderately rigid, and he keeps his neck more flexed today.  Leaning towards the right in the wheelchair.  Neck is also not tilted towards the right. Airway examination reveals mild mouth dryness, no significant airway crowding, Mallampati class II, no sialorrhea. Tongue protrudes centrally and palate elevates symmetrically. Moderatehypophonia,with milddysarthria noted.  Chest:Clear to auscultation without wheezing,  rhonchi or crackles noted.  Heart:S1+S2+0, regular and normal without murmurs, rubs or gallops noted.   Abdomen:Soft, non-tender and non-distended with normal bowel sounds appreciated on auscultation.  Extremities:There is nopitting edema in the distal lower extremities bilaterally.  Skin: Warm and dry without trophic changes noted.  Musculoskeletal: exam revealsno new changes.    Neurologically:  Mental status: The patient is awake, alert and oriented in all 4 spheres. Hisimmediate and remote memory, attention, language skills and fund of knowledge aremildly impaired, he does have more trouble giving his history today, history is primarily provided by his wife. Speechas above. Thought process is linear. Mood is normaland affect is normal otherwise.  On6/18/2020: MMSE: 25/30, CDT: 2/4, AFT: 14/min.  Cranial nerves II - XII are as described above under HEENT exam. Motor exam: Normal bulk, andstrengthis noted. He has mild increase in tone on the right side. Overall moderateto severe bradykinesia is noted, he has an intermittent resting tremor in the right upper and right lower extremities.  He has slight intermittent dyskinesias in both lower extremities, more so on the right. Fine motor skills aremoderate to severely impaired on the right and mild to moderately impaired on the left.   Sensory exam is intact to light touch.  Cerebellar testing shows no dysmetria or intention tremor. Gait, station and balance: Heis in a wheelchair. I did not have him stand or walk for me today.      Assessment and plan:   In summary, DARRAGH NAY a very pleasant 69 year old malewith anunderlying medical history of type 2 diabetes, osteoarthritis, hypertension, obesity, hyperlipidemia, cataracts withstatus post surgeries, recent status post right knee replacement surgery, recent falls, who presents for follow-up consultation of his right-sided predominant Parkinson's  disease,complicated by hallucinations, REM behavior disorder, daytime somnolence, memory loss, recurrent falls, freezing, and intermittent constipation.  His motor symptoms date back to around  04/2013 and RBD symptoms preceding motor symptoms by perhaps 12-15 years even. He has donefairly well over the past years but he has had progressive decline in his motor function and balance as well as some dyskinesias and he has had fairly consistent hallucinations. We talked about the challenges of advancing Parkinson's disease.  He has had home health therapy. He has had some memory loss over time.  We will continue to monitor and maybe consider memory medication down the road.  He is on Mirapex 0.5 mg 3 times daily and he is advised to take it at 8 AM, 1 PM and6 PM.  I again suggested a cautious increase in his Sinemet to 1 pill 6 times a day, at 8 AM, 10:30 AM, 1 PM, 3:30 PM, 6 PM and 8:30 PM.  They did not actually implement this change in July 2021 at her last appointment.  I am concerned about his low blood pressure values and his weight loss.  He is scheduled for a follow-up with his primary care.  He is also encouraged to continue with the long-acting Sinemet closer to bedtime at 930 or 10 PM. We can reconsider the Nuplazid in the future but for now we mutually agreed to hold off on it.  Cost was a big factor.  He was also on Azilect in the past but this was discontinued in March 2018 due to cost.  He is advised to start Nourianz 20 mg once daily for increase in off time.  We talked about expectations and potential side effects.  He was provided with written instructions and a new prescription.  I suggested a 63-monthfollow-up, sooner if needed. I answered all their questions today and the patient and his wife were in agreement I spent 30 minutes in total face-to-face time and in reviewing records during pre-charting, more than 50% of which was spent in counseling and coordination of care, reviewing test  results, reviewing medications and treatment regimen and/or in discussing or reviewing the diagnosis of PD, the prognosis and treatment options. Pertinent laboratory and imaging test results that were available during this visit with the patient were reviewed by me and considered in my medical decision making (see chart for details).

## 2020-10-13 ENCOUNTER — Telehealth: Payer: Self-pay | Admitting: *Deleted

## 2020-10-13 NOTE — Telephone Encounter (Addendum)
Initiated PA Nourianz 20mg  tab on CMM. Key: [id number]. Waiting on clinical questions from [facility] to complete.

## 2020-10-13 NOTE — Telephone Encounter (Signed)
Submitted PA. Waiting on determination from [facility].

## 2020-10-14 NOTE — Telephone Encounter (Signed)
PA approved through 10/13/21. Pt SM#[id number]. [phone]. [employer] number for any questions: [phone].

## 2020-11-03 ENCOUNTER — Other Ambulatory Visit: Payer: Self-pay | Admitting: Neurology

## 2020-11-14 ENCOUNTER — Telehealth: Payer: Self-pay | Admitting: Neurology

## 2020-11-14 NOTE — Telephone Encounter (Signed)
Danny [name],Danny [name](wife) has called to report that Istradefylline (NOURIANZ) 20 MG TABS is not helping, pt's hallucinations are worsening.  Wife would like a call to discuss Dr Rexene Alberts taking pt off of the medication.  Please call

## 2020-11-14 NOTE — Telephone Encounter (Signed)
Pt's wife, [name], per DPR, returned my call. Pt has been on nourianz for about 30 days. Pt has had more hallucinations since starting nourianz. The nourianz has also NOT helped his off-times. Nuplazid is still too expensive for them, per the pt's wife. She is wondering what Dr. Rexene Alberts recommends regarding the nourianz. She is agreeable to waiting to discuss this with Dr. Rexene Alberts tomorrow.

## 2020-11-14 NOTE — Telephone Encounter (Signed)
I called pt's wife, per DPR, to discuss. No answer, left a message asking her to call me back.

## 2020-11-15 NOTE — Telephone Encounter (Signed)
I called pt. I discussed Dr. Guadelupe Sabin recommendations with pt's wife, per DPR. They will d/c nourianz, continue all his current medications, and follow up in January as scheduled. Pt's wife verbalized understanding of recommendations.

## 2020-11-15 NOTE — Telephone Encounter (Signed)
Please call patient's wife back.  I am sorry to hear that the Nourianz did not work out.  Since he had increase in hallucinations and did not find it effective for his off time, we should stop the Nourianz at this time.  He can discontinue it.  Unfortunately, there is no good alternative treatment for something like Nuplazid as this is the only approved medication for Parkinson's related hallucinations.  I would be very reluctant to try a medication off label for this in his case.   I would like to continue all the other medications at this time.

## 2021-01-11 ENCOUNTER — Ambulatory Visit (INDEPENDENT_AMBULATORY_CARE_PROVIDER_SITE_OTHER): Payer: Medicare Other | Admitting: Neurology

## 2021-01-11 ENCOUNTER — Encounter: Payer: Self-pay | Admitting: Neurology

## 2021-01-11 ENCOUNTER — Other Ambulatory Visit: Payer: Self-pay

## 2021-01-11 ENCOUNTER — Telehealth: Payer: Self-pay | Admitting: Neurology

## 2021-01-11 VITALS — BP 108/68 | HR 81 | Wt 193.1 lb

## 2021-01-11 DIAGNOSIS — G20 Parkinson's disease: Secondary | ICD-10-CM | POA: Diagnosis not present

## 2021-01-11 DIAGNOSIS — R413 Other amnesia: Secondary | ICD-10-CM

## 2021-01-11 DIAGNOSIS — G249 Dystonia, unspecified: Secondary | ICD-10-CM

## 2021-01-11 MED ORDER — CARBIDOPA-LEVODOPA 25-100 MG PO TABS: 1.0000 | ORAL_TABLET | Freq: Every day | ORAL | 3 refills | Status: DC

## 2021-01-11 NOTE — Progress Notes (Signed)
Subjective:    Patient ID: Danny [name] is a 70 y.o. male.  HPI     Interim history:   Danny [name] is a very pleasant 70 year old right-handed gentleman with an underlying medical history of type 2 diabetes, osteoarthritis, status post right knee replacement in March 2021, hypertension, obesity, hyperlipidemia, and cataracts, who presents for follow-up consultation of his Parkinson's disease, complicated by decline in mobility, knee pain on the R, history of IBD, memory loss, hallucinations, and recurrent falls.  The patient is accompanied by his wife again today.  I last saw him on 10/11/20, at which time he reported lack of stamina and feeling overall weaker, having more freezing and more off time.  He did not increase his Sinemet to 1 pill 6 times a day as discussed during the visit prior.  He had lost quite a bit of weight.  He had some low blood pressure values.  He was advised to increase his Sinemet only mutually agreed to try him on Nourianz.  His wife called about a month later reporting that he had more hallucinations on the [place].  Unfortunately, they were not able to afford the Nuplazid.  Today, 01/11/2021: He reports very little of his own history but is able to respond to questions appropriately.  His wife reports that he has ongoing visual hallucinations which are all day, every day, worse at night.  He has had trouble sleeping at night.  They have not tried melatonin.  He recently saw his primary care PA and she recommended low-dose Seroquel.  He tried it last night at 12.5 mg which is half a pill and slept well per wife.  He was notably sleepy from it at night.  She is wondering if she can give him a smaller dose during the day but she is encouraged to talk to the prescribing provider about it first.  His appetite is good, he tries to hydrate well throughout the day.  She tries to be really proactive about his constipation issues and gives him MiraLAX as needed.  He takes  immediate release Sinemet 5 times a day, first dose around 10:30 AM and then every 2-1/2 hours.  She reports that the bottle still says 5 times a day but we had talked extensively about this last time and she was agreeable to increasing it to 6 times a day.  He has had some low blood pressure values but drinking electrolyte water such as Gatorade or propel helps.  He continues to take Mirapex 3 times a day and long-acting Sinemet at bedtime.  The patient's allergies, current medications, family history, past medical history, past social history, past surgical history and problem list were reviewed and updated as appropriate.      Previously (copied from previous notes for reference):   I saw him on 07/06/2020, at which time he had more difficulty relating his own history and he had more decline in his motor function.  He had fallen multiple times.  He had knee replacement surgery.  They could not afford the prescription for Nuplazid long-term.  He was advised to increase his Sinemet to 1 pill 6 times a day and continue with the Sinemet CR at bedtime.  He had stable hallucinations.  He continues to take low-dose Mirapex 0.5 mg 3 times daily.    I saw him on 02/11/2020, at which time we talked about his medication regimen, recent fall, and he needed clearance for his right knee replacement.   He had an appointment  in the interim with Debbora Presto, nurse practitioner on 05/23/2020, at which time Sinemet CR was added at bedtime and he was advised to start Nuplazid.   However, his wife reported back that Nuplazid was not started secondary to cost.         I saw him on 10/05/2019, at which time he reported feeling tired during the day.  His wife endorsed that he was quite sleepy during the day, particularly in the early evenings.  He has had some falls.  He had bruised his sternum and ribs.  He did fall in his kitchen and hit his head against a curtain holder and had a small area of skin injury in the right  forehead.  He was advised to continue with Sinemet 1 pill 5 times a day but advised to further reduce his Mirapex to 0.5 mg 3 times daily.  I asked him to have a head CT done.  He had a head CT without contrast on 10/08/2020 and I reviewed the results: IMPRESSION:    Normal CT head (without).  We called him with his test results.     I saw him on 06/04/2019, at which time he reported difficulty with his mobility.  He had fallen, thankfully without major injuries.  His wife had noticed more forgetfulness and some confusion at times.  He was more sleepy during the day.  He was taking Sinemet 4 times a day and had more difficulty in the mornings.  He was on Mirapex 3 times a day.  He had intermittent constipation.  He reported fleeting hallucinations, nothing sustained or anxiety provoking.  He was advised to increase his Sinemet to 1 pill 5 times a day at 3 hourly intervals starting at 8 AM.  He was advised to continue with Mirapex 3 times daily.  He was advised to be very proactive about constipation issues.    I saw him on 12/03/2018, at which time he was advised to continue with Sinemet 1 pill 4 times a day.  He did have her fall incident after he missed the chair when sitting down.  His memory was stable for the most part.  He was advised to hydrate well and be proactive about constipation issues, we talked about fall prevention.     I saw him on 06/05/2018, at which time he felt fairly stable but his wife had noticed more shuffling and also some mood irritability. He had no falls thankfully and some forgetfulness was noted. He was more sleepy during the day. Suggested he continue with generic Mirapex 0.75 mg 3 times a day but increase his Sinemet from 3 times a day to 1 pill 4 times a day.   His wife called in the interim in October 2019 requesting a letter to support their cancellation of their timeshare as he was not able to drive and they were not able to utilize their timeshare.    I saw him on  12/05/2017, at which time he felt fairly stable on Sinemet, we had reduced his Mirapex to 0.75 mg 3 times a day. He is trying to stay active. He had no recent falls. I suggested we continue with his Sinemet and Mirapex at the current doses.   I saw him on 09/09/2017 at which time he had been more sleepy during the day. His wife was worried that he was more preoccupied with sex. He had ongoing issues with intermittent double vision. He had intermittent and mild dream enactments. Suggested we gradually start  him on low-dose Sinemet starting with half pill and gradually increasing this to 1 pill 3 times a day. His wife called in the interim in October reporting that he had done fairly well with Sinemet, at which time we reduced his Mirapex from 1 mg 3 times a day to 0.75 mg 3 times a day.      I saw him on 03/04/2017, at which time he felt stable. He retired in December 2017. The daughter was living with them. Mother-in-law had moved in as well. His wife retired also. He was going to the gym on a regular basis. Memory-wise, he had mild forgetfulness, no mood issues, no sleep issues, no side effects from the Mirapex. He had no recent falls thankfully. He had some mild dream enactments but no major issues. Looking back, his symptoms of peak date back to 2014, he had a sleep study in early 2014 with PLMS noted but no OSA and he denies restless leg symptoms. We mutually agreed to taper him off of Azilect as it was too expensive. I suggested in lieu of the Azilect, we increase his pramipexole to 1 mg 3 times a day.     I saw him on 09/03/2016, at which time he reported doing well, no telltale differences after increasing the Mirapex. He gave up driving. He decided to retire by the end of December 2017. His mother-in-law was supposed to move in with them. His memory was stable, sleep was stable, no real mood issues, no recent falls. I suggested we keep his medications the same including Azilect 1 mg once daily,  Mirapex 0.75 mg 3 times a day.   I saw him on 02/27/2016, at which time he reported more difficulty with his walking, he had become slower. Wife had noted that it would take him longer to do things. He was dragging his feet at times. He was not swinging his arms as well. He was working full-time as a Land, avoiding stairs and heights. His A1c in December 2016 was a little up at 7.6. Memory was stable, with some mild forgetfulness. Mood was stable, RBD was infrequent. He reported no recent falls. I suggested he continue with Azilect and we increased his Mirapex to 0.75 mg 3 times a day.    I saw him on 08/29/2015 at which time he reported doing fairly well. He was able to tolerate Azilect. His Mirapex was 0.5 mg 3 times a day. He was working at the computer most of the time. He was not always drinking enough water. He had a good appetite. He felt his memory was stable. RBD was less frequent but still occurring. His wife was able to sleep in the same bed with him. He had no recent falls. He had some recent blurry vision was going to see his ophthalmologist. We mutually agreed to continue his current medication regimen.   I saw him on 04/25/2015, at which time he reported doing fairly well overall, but had noted smaller changes, including more slowness, difficulty with climbing ladders. He was advised by his boss to not climb ladders or walk over obstacles at work. Thankfully, his work environment is understanding. He had another business trip coming up and a colleague was to drive and do the climbing on the sites. He reported mild forgetfulness and some problems with depth perception. His diplopia had improved since he was given prism glasses by his ophthalmologist. I suggested, we try him on Azilect and keep his Mirapex the same.  I saw him on 02/04/2015, at which time he reported ongoing issues with diplopia, particularly at night while driving. He was still working full-time. He was still  on low-dose Mirapex, 0.25 mg 3 times a day. He had no side effects. He had had a sleep study which per his report did not show any OSA. I asked him to make an appointment with his eye doctor. I suggested an increase in Mirapex to 0.5 mg 3 times a day.   I first met him on 12/28/2014, at which time he reported that he was having more fatigue. However, he also had reduce his caffeine intake. He had no recent falls and mood stable. He was working full-time. He reported that he had intermittent double vision in the last 3 months. His PCP requested a sooner than scheduled appointment for diplopia. His exam was stable at the time and I suggested no new medication changes.   He has had some short-term memory issues. He works full-time. He is an Chief Financial Officer. He likes square dance but his wife has noted that he has had more problems with his nighttime driving and his squared hands. In the recent 3 months he has had some intermittent double vision at night. He has drooling at night. He rarely drinks alcohol and usually drinks 4-5 cups of coffee per day, occasional sodas. He quit smoking in 1978. Blood pressure is low for him today. It was recently noted to be trending lower and his blood pressure medication was reduced. He takes his cholesterol medication every other day.   He had a brain MRI without contrast on 11/04/2013: Normal MRI scan of the brain. Incidental findings of a chronic paranasal sinusitis with deviated nasal septum to the left.  He previously followed with Dr. Jim Like and was last seen by him on 08/30/2014, at which time his Mirapex was kept at 0.25 mg 3 times a day. He reported no side effects, in particular no impulse control disorder. I reviewed Dr. Hazle Quant notes. He first met Dr. Janann Colonel on 10/26/13, at which time the patient reported a right hand tremor noticed over the previous 4-6 months. He had changes in his handwriting, slowness, some stiffness and changes in his walking.  His wife reported  a longer standing history of REM behavior disorder. He has been in outpatient physical therapy.   His Past Medical History Is Significant For: Past Medical History:  Diagnosis Date  . Allergy    SEASONAL  . Cataract    BILATERAL-REMOVED  . Essential hypertension, benign    PT.DENIES ON AS PREVENTIVE 08/17/19  . GERD (gastroesophageal reflux disease)   . Hx of adenomatous polyp of colon 09/04/2019   08/2019 diminutive adenoma No recall given findings and age  . Mixed hyperlipidemia    PT.DENIES,STATED ON AS PREVENTIVE 07/30/19  . Neuromuscular disorder (HCC)    PARKINSONS  . OA (osteoarthritis) of knee    right  . Obesity, unspecified   . Osteoarthritis    KNEE AND SHOULDERS  . Parkinsons ([place])   . Type II or unspecified type diabetes mellitus without mention of complication, not stated as uncontrolled     His Past Surgical History Is Significant For: Past Surgical History:  Procedure Laterality Date  . CATARACT EXTRACTION, BILATERAL    . GANGLION CYST EXCISION  age 18 years   right  . KNEE ARTHROSCOPY     right  . KNEE ARTHROSCOPY    . TOTAL KNEE ARTHROPLASTY Right 03/08/2020   Procedure: RIGHT TOTAL KNEE  ARTHROPLASTY;  Surgeon: Garald Balding, MD;  Location: [facility];  Service: Orthopedics;  Laterality: Right;  . WRIST FRACTURE SURGERY    . WRIST SURGERY  04/2012    His Family History Is Significant For: Family History  Problem Relation Age of Onset  . Diabetes Sister   . Diabetes Daughter   . Cancer Father   . Heart disease Father   . Colon polyps Son   . Diabetes Mother   . Lung cancer Mother   . Colon cancer Neg Hx   . Esophageal cancer Neg Hx   . Rectal cancer Neg Hx   . Stomach cancer Neg Hx     His Social History Is Significant For: Social History   Socioeconomic History  . Marital status: Married    Spouse name: [name]  . Number of children: 3  . Years of education: 16  . Highest education level: Bachelor's degree (e.g., BA, AB, BS)  Occupational  History  . Occupation: Chief Financial Officer  Tobacco Use  . Smoking status: Former Smoker    Packs/day: 1.50    Years: 15.00    Pack years: 22.50    Types: Cigarettes    Quit date: 12/17/1982    Years since quitting: 38.0  . Smokeless tobacco: Never Used  . Tobacco comment: over 40 yrs  Vaping Use  . Vaping Use: Never used  Substance and Sexual Activity  . Alcohol use: Yes    Comment: very rare  . Drug use: No  . Sexual activity: Yes    Partners: Female  Other Topics Concern  . Not on file  Social History Narrative   [name], motorcycle rider.  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  He has 3 adult children.  His wife had 2 children, one of whom is deceased.   Patient is working full-time.   Patient has a Bachelor's degree.   Patient is right-handed.   Patient drinks 6 cups of coffee daily.   Social Determinants of Health   Financial Resource Strain: Not on file  Food Insecurity: Not on file  Transportation Needs: Not on file  Physical Activity: Not on file  Stress: Not on file  Social Connections: Not on file    His Allergies Are:  Allergies  Allergen Reactions  . Codeine Nausea And Vomiting  :   His Current Medications Are:  Outpatient Encounter Medications as of 01/11/2021  Medication Sig  . Ascorbic Acid (VITAMIN C ADULT GUMMIES PO) Take 2 tablets by mouth daily.   Marland Kitchen aspirin 81 MG chewable tablet Chew 1 tablet (81 mg total) by mouth 2 (two) times daily.  . carbidopa-levodopa (SINEMET CR) 50-200 MG tablet Take 1 tablet by mouth at bedtime.  . Cinnamon 500 MG capsule Take 500 mg by mouth daily.  . famotidine (PEPCID) 20 MG tablet Take 20 mg by mouth in the morning and at bedtime.   . Ginger, Zingiber officinalis, 550 MG CAPS Take 550 mg by mouth daily.  Marland Kitchen glipiZIDE (GLUCOTROL XL) 10 MG 24 hr tablet TAKE 1 TABLET BY MOUTH DAILY (Patient taking differently: Take 10 mg by mouth at bedtime.)  . Glucosamine-Chondroit-Vit C-Mn (GLUCOSAMINE CHONDR 1500 COMPLX PO) Take 1,500 mg by  mouth daily.  Marland Kitchen ketoconazole (NIZORAL) 2 % cream Apply 1 application topically 2 (two) times daily.   . metFORMIN (GLUCOPHAGE) 1000 MG tablet Take 1 tablet (1,000 mg total) by mouth 2 (two) times daily with a meal.  . Multiple Vitamin (MULTIVITAMIN) tablet Take 1 tablet by mouth  daily.  . pramipexole (MIRAPEX) 0.5 MG tablet TAKE 1 TABLET (0.5 MG TOTAL) BY MOUTH 3 (THREE) TIMES DAILY.  Marland Kitchen QUEtiapine (SEROQUEL) 25 MG tablet Take by mouth. Taking 1/2 tablet at bedtime  . [DISCONTINUED] carbidopa-levodopa (SINEMET IR) 25-100 MG tablet Take 1 tablet by mouth 6 (six) times daily.  . [DISCONTINUED] Istradefylline (NOURIANZ) 20 MG TABS Take 20 mg by mouth daily.  . carbidopa-levodopa (SINEMET IR) 25-100 MG tablet Take 1 tablet by mouth 6 (six) times daily.   No facility-administered encounter medications on file as of 01/11/2021.  :  Review of Systems:  Out of a complete 14 point review of systems, all are reviewed and negative with the exception of these symptoms as listed below:  Review of Systems  Neurological:       Here for f/u on PD. Pt's wife sts progression has been visible since last visit. Pt moves slower, fatigued and visual hallucinations are constant. Reports over the last 2 weeks he was not sleeping well, PCP started him on Seroquel 25 mg 1/2 tab at bedtime. Wife reports he took the med for the first time last night and slept well.  She wanted to know what could be done to help with hallucinations?    Objective:  Neurological Exam  Physical Exam Physical Examination:   Vitals:   01/11/21 1407  BP: 108/68  Pulse: 81  SpO2: 96%    General Examination: The patient is a very pleasant 70 y.o. male in no acute distress. He appears mildly deconditioned, in his wheelchair.  Well-groomed.  Good spirits.  HEENT:Normocephalic, atraumatic, pupils are equal, round and reactive to light, extraocular trackingshows mild saccadic breakdown, no nystagmus, no significant limitation to  downgaze, slight limitation to upper gaze. He hasdecrease in eye blink rate, status post bilateral cataract repairs, hearing is grossly intact, bilateral hearing aids in place.Prism eye glassesin place.Face is moderatelymasked, neck is moderately rigid,and he keeps his neck flexed, seems stable from last time. Leaning towards the right in the wheelchair.  Neck is also not tilted towards the right.Airway examination reveals mild mouth dryness, no significant airway crowding, Mallampati class II, no sialorrhea. Tongue protrudes centrally and palate elevates symmetrically. Moderatehypophonia,with milddysarthria noted.  Chest:Clear to auscultation without wheezing, rhonchi or crackles noted.  Heart:S1+S2+0, regular and normal without murmurs, rubs or gallops noted.   Abdomen:Soft, non-tender and non-distended with normal bowel sounds appreciated on auscultation.  Extremities:There is nopitting edema in the distal lower extremities bilaterally.  Skin: Warm and dry without trophic changes noted.  Musculoskeletal: exam revealsno new changes.   Neurologically:  Mental status: The patient is awake, alert and oriented in all 4 spheres. Hisimmediate and remote memory, attention, language skills and fund of knowledge aremildly impaired,he does have more trouble giving his history today, history is primarily provided by his wife. Speechas above. Thought process is linear. Mood is normaland affect is normalotherwise.  On6/18/2020: MMSE: 25/30, CDT: 2/4, AFT: 14/min.  Cranial nerves II - XII are as described above under HEENT exam. Motor exam: Normal bulk, andstrengthis noted. He has mild increase in tone on the right side. Overall moderateto severe bradykinesia is noted,he has an intermittent resting tremor in the right upper and right lower extremities. He has slight intermittent dyskinesias in both lower extremities, seems stable and fairly symmetrical. Fine motor  skills aremoderate to severely impaired on the right and moderately impaired on the left. Sensory exam is intact to light touch.  Cerebellar testing shows no dysmetria or intention tremor. Gait, station and  balance: Heis in a wheelchair. I did not have him stand or walk for me today.     Assessment and plan:   In summary, KEAN GAUTREAU a very pleasant 70 year old malewith anunderlying medical history of type 2 diabetes, osteoarthritis, hypertension, obesity, hyperlipidemia, cataracts withstatus post surgeries, status post right knee replacement surgery, Hx of falls,who presents for follow-up consultation of his right-sided predominant Parkinson's disease,complicated byhallucinations, REM behavior disorder, daytime somnolence, memory loss, recurrent falls, freezing, and constipation and difficulty sleeping at night. His motor symptoms date back toaround 04/2013 and RBD symptoms preceding motor symptoms by perhaps 12-15 years even. He had donefairly well over thepast years but he has had progressive decline in his motor function and balance as well as some dyskinesias and he has had fairly consistent hallucinations. We talked about the challenges of advancing Parkinson's disease. He has had home health therapy. He has had some memory loss over time.We will continue to monitor and maybe consider memory medication down the road. He is on Mirapex 0.5 mg 3 times daily and he is advised to take it at 8 AM, 1 PM and6 PM. I again suggested a cautious increase in his Sinemet to 1 pill 6 times a day, at 8 AM, 10:30 AM, 1 PM, 3:30 PM, 6 PM and 8:30 PM.  They did not actually implement this change in July 2021 or in 10/21.  He had a recent checkup with his primary care and was given a prescription for Seroquel 25 mg strength half a pill at bedtime as needed for sleep.  He took it last night and slept well.  His wife is wondering if he can take a smaller dose during the day, she does worry about  its sedating properties.  She is advised to check with the primary care but maybe a quarter of a pill may help during the day so long as the prescriber is agreeable.  She is advised to increase the Sinemet to 6 pills a day as discussed previously and I renewed the prescription.  He is advised to continue with the CR at bedtime and the Mirapex 0.5 mg 3 times daily for now.   Unfortunately, he was not able to get Nuplazid due to cost.  In October 2021 we tried Nourianz 20 mg once daily for increase in off time but after about a month his wife called back and reported that he was having more hallucinations on it so we decided to stop it.  He is advised to continue to stay well-hydrated and they are advised to stay proactive about constipation issues.  He can also try melatonin at night for sleep if the need arises, I provided written instructions today for this.  He is advised to follow-up routinely in 4 months, sooner if needed.  I answered all their questions today and they were in agreement. I spent 30 minutes in total face-to-face time and in reviewing records during pre-charting, more than 50% of which was spent in counseling and coordination of care, reviewing test results, reviewing medications and treatment regimen and/or in discussing or reviewing the diagnosis of PD, the prognosis and treatment options. Pertinent laboratory and imaging test results that were available during this visit with the patient were reviewed by me and considered in my medical decision making (see chart for details).

## 2021-01-11 NOTE — Patient Instructions (Addendum)
As discussed, you have a prescription for Sinemet regular release strength today 1 pill 6 times a day, please check the bottle at home and check with the pharmacist if need be.  Please continue with Mirapex 1 pill 3 times a day and long-acting Sinemet at bedtime.  You can check with [name], PA, regarding the Seroquel, if she is agreeable to let you try a smaller dose during the day for your hallucinations.  I am glad to hear it has helped and low-dose at night for your sleep.  If need be you can also try Melatonin at night for sleep: take 1 mg to 3 mg, one to 2 hours before your bedtime. You can go up to 5 mg if needed. It is over the counter and comes in pill form, chewable form and spray, if you prefer.

## 2021-01-11 NOTE — Telephone Encounter (Signed)
Pt's wife, Steen Bisig (on [place]) called, picked up carbidopa-levodopa (SINEMET IR) 25-100 MG tablet at the pharmacy. The prescription still has 5 times a day instead of 6 times a day. I will run out of medication before the next refill. Can you change there with the pharmacy. Would like a call from the nurse.

## 2021-01-12 NOTE — Telephone Encounter (Signed)
[facility] called [facility] and spoke with [facility]. I provided verbal order to d/c the carb/levo order for 25-100 5 times per day. I advised the carb/levo order for 25-100 6 times per day is the current dosage. [facility].  I called the pt's wife ( ok per dpr) and advised of message above. She verbalized understanding and appreciation for the call.

## 2021-05-25 ENCOUNTER — Encounter: Payer: Self-pay | Admitting: Adult Health

## 2021-05-25 ENCOUNTER — Ambulatory Visit (INDEPENDENT_AMBULATORY_CARE_PROVIDER_SITE_OTHER): Payer: Medicare Other | Admitting: Adult Health

## 2021-05-25 VITALS — BP 112/66 | HR 85 | Wt 187.0 lb

## 2021-05-25 DIAGNOSIS — R413 Other amnesia: Secondary | ICD-10-CM

## 2021-05-25 DIAGNOSIS — G20 Parkinson's disease: Secondary | ICD-10-CM

## 2021-05-25 DIAGNOSIS — R296 Repeated falls: Secondary | ICD-10-CM | POA: Diagnosis not present

## 2021-05-25 NOTE — Progress Notes (Addendum)
PATIENT: Danny [name] DOB: [birth year]  REASON FOR VISIT: follow up HISTORY FROM: patient PRIMARY NEUROLOGIST:   HISTORY OF PRESENT ILLNESS: Today 05/25/21:  Mr. Robbins is a 70 year old male with a history of Parkinson's disease.  He returns today for follow-up.  He is currently on Sinemet 25-100 mg 6 times a day and Sinemet CR 50-200 mg at bedtime.  He also takes Mirapex 0.5 mg 3 times a day.  He lives at home with his wife.  He denies a tremor.  Reports that he primarily uses a wheeled chair outside the home.  He will try to ambulate in the home using the furniture to hold onto.  He has had 5 falls since May.  Has not completed any recent physical therapy.  Reports good appetite although he has lost 39 pounds over the last year per the wife.  He does feel that his memory has declined some.  He joins me today for follow-up.  HISTORY 01/11/2021: He reports very little of his own history but is able to respond to questions appropriately.  His wife reports that he has ongoing visual hallucinations which are all day, every day, worse at night.  He has had trouble sleeping at night.  They have not tried melatonin.  He recently saw his primary care PA and she recommended low-dose Seroquel.  He tried it last night at 12.5 mg which is half a pill and slept well per wife.  He was notably sleepy from it at night.  She is wondering if she can give him a smaller dose during the day but she is encouraged to talk to the prescribing provider about it first.  His appetite is good, he tries to hydrate well throughout the day.  She tries to be really proactive about his constipation issues and gives him MiraLAX as needed.  He takes immediate release Sinemet 5 times a day, first dose around 10:30 AM and then every 2-1/2 hours.  She reports that the bottle still says 5 times a day but we had talked extensively about this last time and she was agreeable to increasing it to 6 times a day.  He has had some low blood  pressure values but drinking electrolyte water such as Gatorade or propel helps.  He continues to take Mirapex 3 times a day and long-acting Sinemet at bedtime.    REVIEW OF SYSTEMS: Out of a complete 14 system review of symptoms, the patient complains only of the following symptoms, and all other reviewed systems are negative.  ALLERGIES: Allergies  Allergen Reactions   Codeine Nausea And Vomiting    HOME MEDICATIONS: Outpatient Medications Prior to Visit  Medication Sig Dispense Refill   Ascorbic Acid (VITAMIN C ADULT GUMMIES PO) Take 2 tablets by mouth daily.      aspirin (ASPIRIN 81) 81 MG EC tablet Take 81 mg by mouth daily. Swallow whole.     carbidopa-levodopa (SINEMET CR) 50-200 MG tablet Take 1 tablet by mouth at bedtime. 30 tablet 11   carbidopa-levodopa (SINEMET IR) 25-100 MG tablet Take 1 tablet by mouth 6 (six) times daily. 540 tablet 3   Cinnamon 500 MG capsule Take 500 mg by mouth daily.     famotidine (PEPCID) 20 MG tablet Take 20 mg by mouth in the morning and at bedtime.      Ginger, Zingiber officinalis, 550 MG CAPS Take 550 mg by mouth daily.     glipiZIDE (GLUCOTROL XL) 10 MG 24 hr tablet TAKE 1  TABLET BY MOUTH DAILY (Patient taking differently: Take 10 mg by mouth at bedtime.) 90 tablet 3   Glucosamine-Chondroit-Vit C-Mn (GLUCOSAMINE CHONDR 1500 COMPLX PO) Take 1,500 mg by mouth daily.     ketoconazole (NIZORAL) 2 % cream Apply 1 application topically 2 (two) times daily.      Multiple Vitamin (MULTIVITAMIN) tablet Take 1 tablet by mouth daily.     pramipexole (MIRAPEX) 0.5 MG tablet TAKE 1 TABLET (0.5 MG TOTAL) BY MOUTH 3 (THREE) TIMES DAILY. 270 tablet 3   QUEtiapine (SEROQUEL) 50 MG tablet Take by mouth.     metFORMIN (GLUCOPHAGE) 1000 MG tablet Take 1 tablet (1,000 mg total) by mouth 2 (two) times daily with a meal. (Patient not taking: Reported on 05/25/2021) 20 tablet 0   aspirin 81 MG chewable tablet Chew 1 tablet (81 mg total) by mouth 2 (two) times daily.  (Patient taking differently: Chew 81 mg by mouth once.)     No facility-administered medications prior to visit.    PAST MEDICAL HISTORY: Past Medical History:  Diagnosis Date   Allergy    SEASONAL   Cataract    BILATERAL-REMOVED   Essential hypertension, benign    PT.DENIES ON AS PREVENTIVE 08/17/19   GERD (gastroesophageal reflux disease)    Hx of adenomatous polyp of colon 09/04/2019   08/2019 diminutive adenoma No recall given findings and age   Mixed hyperlipidemia    PT.DENIES,STATED ON AS PREVENTIVE 07/30/19   Neuromuscular disorder (HCC)    PARKINSONS   OA (osteoarthritis) of knee    right   Obesity, unspecified    Osteoarthritis    KNEE AND SHOULDERS   Parkinsons ([place])    Type II or unspecified type diabetes mellitus without mention of complication, not stated as uncontrolled     PAST SURGICAL HISTORY: Past Surgical History:  Procedure Laterality Date   CATARACT EXTRACTION, BILATERAL     GANGLION CYST EXCISION  age 57 years   right   KNEE ARTHROSCOPY     right   KNEE ARTHROSCOPY     TOTAL KNEE ARTHROPLASTY Right 03/08/2020   Procedure: RIGHT TOTAL KNEE ARTHROPLASTY;  Surgeon: Garald Balding, MD;  Location: [facility];  Service: Orthopedics;  Laterality: Right;   WRIST FRACTURE SURGERY     WRIST SURGERY  04/2012    FAMILY HISTORY: Family History  Problem Relation Age of Onset   Diabetes Sister    Diabetes Daughter    Cancer Father    Heart disease Father    Colon polyps Son    Diabetes Mother    Lung cancer Mother    Colon cancer Neg Hx    Esophageal cancer Neg Hx    Rectal cancer Neg Hx    Stomach cancer Neg Hx     SOCIAL HISTORY: Social History   Socioeconomic History   Marital status: Married    Spouse name: [name]   Number of children: 3   Years of education: 16   Highest education level: Bachelor's degree (e.g., BA, AB, BS)  Occupational History   Occupation: Chief Financial Officer  Tobacco Use   Smoking status: Former    Packs/day: 1.50    Years:  15.00    Pack years: 22.50    Types: Cigarettes    Quit date: 12/17/1982    Years since quitting: 38.4   Smokeless tobacco: Never   Tobacco comments:    over 40 yrs  Vaping Use   Vaping Use: Never used  Substance and Sexual Activity   Alcohol  use: Yes    Comment: very rare   Drug use: No   Sexual activity: Yes    Partners: Female  Other Topics Concern   Not on file  Social History Narrative   [place], motorcycle rider.  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  He has 3 adult children.  His wife had 2 children, one of whom is deceased.   Patient is working full-time.   Patient has a Bachelor's degree.   Patient is right-handed.   Patient drinks 6 cups of coffee daily.   Social Determinants of Health   Financial Resource Strain: Not on file  Food Insecurity: Not on file  Transportation Needs: Not on file  Physical Activity: Not on file  Stress: Not on file  Social Connections: Not on file  Intimate Partner Violence: Not on file      PHYSICAL EXAM  Vitals:   05/25/21 1425  BP: 112/66  Pulse: 85  Weight: 187 lb (84.8 kg)   Body mass index is 26.83 kg/m.  MMSE - Mini Mental State Exam 05/25/2021 06/04/2019  Orientation to time 4 4  Orientation to Place 5 5  Registration 3 3  Attention/ Calculation 0 3  Recall 2 2  Language- name 2 objects 2 2  Language- repeat 1 1  Language- follow 3 step command 3 3  Language- read & follow direction 1 1  Write a sentence 1 1  Copy design 0 0  Total score 22 25     Generalized: Well developed, in no acute distress   Neurological examination  Mentation: Alert oriented to time, place, history taking. Follows all commands speech. Masked facies. Cranial nerve II-XII: Pupils were equal round reactive to light. Extraocular movements were full, visual field were full on confrontational test. Facial sensation and strength were normal. Uvula tongue midline. Head turning and shoulder shrug  were normal and symmetric. Neck drop. Motor:  The motor testing reveals 5 over 5 strength of all 4 extremities.  Sensory: Sensory testing is intact to soft touch on all 4 extremities. No evidence of extinction is noted.  Coordination: Cerebellar testing reveals diffculty with finger-nose-finger d/t vision. Good heel-to-shin bilaterally.  Gait and station: in a wheelchair. Reflexes: Deep tendon reflexes are symmetric and normal bilaterally.   DIAGNOSTIC DATA (LABS, IMAGING, TESTING) - I reviewed patient records, labs, notes, testing and imaging myself where available.  Lab Results  Component Value Date   WBC 12.9 (H) 03/11/2020   HGB 12.5 (L) 03/11/2020   HCT 39.1 03/11/2020   MCV 97.0 03/11/2020   PLT 168 03/11/2020      Component Value Date/Time   NA 135 03/11/2020 0329   NA 138 04/16/2018 1005   K 4.2 03/11/2020 0329   CL 103 03/11/2020 0329   CO2 22 03/11/2020 0329   GLUCOSE 140 (H) 03/11/2020 0329   BUN 25 (H) 03/11/2020 0329   BUN 21 04/16/2018 1005   CREATININE 1.03 03/11/2020 0329   CREATININE 1.03 10/02/2016 1605   CALCIUM 8.8 (L) 03/11/2020 0329   PROT 6.9 03/01/2020 1011   PROT 6.8 04/16/2018 1005   ALBUMIN 3.9 03/01/2020 1011   ALBUMIN 4.3 04/16/2018 1005   AST 20 03/01/2020 1011   ALT 10 03/01/2020 1011   ALKPHOS 48 03/01/2020 1011   BILITOT 1.3 (H) 03/01/2020 1011   BILITOT 0.8 04/16/2018 1005   GFRNONAA >60 03/11/2020 0329   GFRNONAA 76 02/01/2015 1110   GFRAA >60 03/11/2020 0329   GFRAA 88 02/01/2015 1110  Lab Results  Component Value Date   CHOL 126 04/16/2018   HDL 54 04/16/2018   LDLCALC 53 04/16/2018   TRIG 93 04/16/2018   CHOLHDL 2.3 04/16/2018   Lab Results  Component Value Date   HGBA1C 6.4 (H) 03/08/2020   No results found for: VITAMINB12 Lab Results  Component Value Date   TSH 4.34 10/02/2016      ASSESSMENT AND PLAN 70 y.o. year old male  has a past medical history of Allergy, Cataract, Essential hypertension, benign, GERD (gastroesophageal reflux disease), adenomatous  polyp of colon (09/04/2019), Mixed hyperlipidemia, Neuromuscular disorder ([place]), OA (osteoarthritis) of knee, Obesity, unspecified, Osteoarthritis, Parkinsons ([place]), and Type II or unspecified type diabetes mellitus without mention of complication, not stated as uncontrolled. here with :  Parkinson's disease Memory disturbance Frequent falls  -Continue Sinemet 25-100 mg 6 times a day -Continue Sinemet CR 50-200 mg at bedtime -Continue Mirapex 0.5 mg 3 times a day -MMSE 22/30--we will continue monitoring memory for now may consider Namenda in the future -We will continue monitoring weight -We will place a referral for home physical therapy due to frequent falls -Follow-up in 6 months or sooner if needed     Ward Givens, MSN, NP-C 05/25/2021, 2:34 PM [facility] [address] [place], [place] [address] [phone]  I reviewed the above note and documentation by the Nurse Practitioner and agree with the history, exam, assessment and plan as outlined above. I was available for consultation. [name], MD, PhD [facility])

## 2021-05-25 NOTE — Patient Instructions (Signed)
Your Plan:  Continue Sinemet 25-100 mg six times a day Continue Sinemet CR 50-200 at bedtime Contine Mirapex 0.5 mg three times a day If your symptoms worsen or you develop new symptoms please let us know.    Thank you for coming to see [place] at [facility]. I hope we have been able to provide you high quality care today.  You may receive a patient satisfaction survey over the next few weeks. We would appreciate your feedback and comments so that we may continue to improve ourselves and the health of our patients.

## 2021-05-29 ENCOUNTER — Telehealth: Payer: Self-pay

## 2021-05-29 ENCOUNTER — Telehealth: Payer: Self-pay | Admitting: *Deleted

## 2021-05-29 MED ORDER — CARBIDOPA-LEVODOPA ER 50-200 MG PO TBCR: 1.0000 | EXTENDED_RELEASE_TABLET | Freq: Every day | ORAL | 11 refills | Status: DC

## 2021-05-29 NOTE — Telephone Encounter (Signed)
Pt wife called need medication refill called in today. Please call [phone]

## 2021-05-29 NOTE — Telephone Encounter (Addendum)
I called home and could not leave a message.  By deduction this was due, carbidopa levodopa 50/200 mg tablets take 1 tablet at bedtime #30 with [facility] [address] [place].

## 2021-05-29 NOTE — Telephone Encounter (Signed)
Referral for [facility] sent to [facility]. Rep: Adela Lank in Epic. P: [phone].

## 2021-06-06 NOTE — Telephone Encounter (Signed)
[facility] (Amy) called, physical therapy frequency: 2x a week for 3 wks, 1x a wk for 4 wks.   Contact info: [phone]

## 2021-06-13 ENCOUNTER — Telehealth: Payer: Self-pay | Admitting: Adult Health

## 2021-06-13 NOTE — Telephone Encounter (Signed)
Called wife.  I gave her the instructions to decrease first am dose of 25/100 tablet to 1/2 tablet for the next 2 days.  Monitor Bp, get up slowly, keep well hydrated.  Let us know Thursday afternoon how he did.  She verbalized understanding.

## 2021-06-13 NOTE — Telephone Encounter (Signed)
I called wife with patient.  He is on carbidopa levodopa 25/100 mg tablets takes 1 - 6 times a day.  Carbidopa levodopa 50/200 mg he takes at bedtime around 9 or 10:00 at night.  In May he had a few times where his blood pressure dropped in the mornings.  The last 3 days this is happened as well.  He wakes up well in the morning blood pressure is okay 133/90 this morning.  We will give his 730 dose of intermediate release his blood pressure drops to 62/47.  She has to give him electrolytes salts propel. blood pressure to 96/62.  No new meds.  She realizes PD changes.  What recommendation do you propose?   He is up at night 2-3 times to restroom.

## 2021-06-13 NOTE — Telephone Encounter (Signed)
Slater, please call patient's wife back and ask her to reduce his morning dose of Sinemet to half a pill for the next few days and monitor symptoms and blood pressure.  Please ask her to provide feedback after few days.  Please encourage fluid intake, and remind patient to stand up slowly to get his bearings first.

## 2021-06-13 NOTE — Telephone Encounter (Signed)
Dr. Rexene Alberts- would you reduce his sinemet dose?

## 2021-06-13 NOTE — Telephone Encounter (Signed)
Pts wife called stating the pt is having low BP constantly and when she called the PCP they recommended for her to call pts neurologist because this may have to do with the medications that he is on. Please advise.

## 2021-06-15 NOTE — Telephone Encounter (Signed)
I called wife and gave her the recommendations per Dr. Rexene Alberts to give the first 2 doses of sinemet (1/2 tablet) then to resume his normal dosing.  She verbalized understanding and will let us know how he fairs on Tuesday.  Appreciated call back.

## 2021-06-15 NOTE — Telephone Encounter (Signed)
Thank you, Lovey Newcomer, for the update.  If they are agreeable, I recommend that his first 2 doses of Sinemet be cut in half and the rest of the doses he can continue with 1 pill (4 doses).

## 2021-06-15 NOTE — Telephone Encounter (Signed)
Spoke to wife.  She was checking with [place] after trying the decreased dose of sinemet 25/100 first am dose to 1/2 tablet.  Pt continued to have lowered Bp and she had to hydrate him with fluids/propel in addition to already hydrating,   she would note that he would be lethargic/no energy.  His bp range from early am 112-81//111-82, then 85/60//72/54 then would gradually go back up 101/67//114-77 over several hours.  She felt like the PD tremors did not change with the decrease.  Do you want to change anything more?

## 2021-06-20 NOTE — Telephone Encounter (Signed)
Pt's wife, Domanick Cuccia (on [place]) called, he is doing good as long as he get Carbidopa-Levodopa 1/2 tablet am, 1/2 tablet 10:30a, Drinks Electrolytes 20 oz and 20oz water. Do Dr. Rexene Alberts want to leave like that. Would like a call from the nurse.

## 2021-06-21 NOTE — Telephone Encounter (Signed)
Spoke with patient's wife [name] to relay message from Dr. Rexene Alberts.  She will continue to monitor and will let us know of any concerns.  She verbalized understanding of the message and appreciation for the call back.

## 2021-06-21 NOTE — Telephone Encounter (Signed)
Patient can maintain on the most recent regimen with his Sinemet with half a tablet for the first 2 doses, encourage water intake, stand up slowly, consider compression socks up to the knees. Please notify patient or his wife.

## 2021-06-21 NOTE — Telephone Encounter (Signed)
I agree with continuing to monitor him.  If we reduce his Sinemet, there will be instances of decrease in mobility, unfortunately.  No additional recommendations from my end at this point.

## 2021-06-21 NOTE — Telephone Encounter (Signed)
Spoke with patient's wife and discussed Dr Guadelupe Sabin response regarding Sinemet and suggestion to consider compression stockings and be sure to hydrate with water and stand up slowly.  [name] stated this morning the patient had trouble walking and standing up.  She had to help him into the bathroom with an assistive device.  She stated his blood pressure at 7:30 AM was 142/91 and at 8:30 AM it was 70/53.  At 9:30 AM it was 100/65.  She has him drinking up to 48 ounces of water per day with electrolytes in one of the bottles. She said she will continue with the plan to take the 1/2 tab in AM and 1/2 tab at 10:30 AM. He now feels 80-90% better. She stated she would let us know if he has any trouble walking again as this has not happened in a long time.  She also stated that intermittently over the last couple weeks she has noticed his mouth seems to droop on the right side however it straightens up and she denies other known stroke-like symptoms.  She is aware to call 911 if the patient develops any stroke symptoms.

## 2021-07-03 ENCOUNTER — Telehealth: Payer: Self-pay | Admitting: Adult Health

## 2021-07-03 NOTE — Telephone Encounter (Signed)
FYI: [facility] (Amy) called, patient's BP after 15 minutes of exercise sitting in a chair was 78/54.  He is alert , but feeling tired. Wife said, she checked earlier this morning and was much lower, she keeps pushing fluid in him.

## 2021-07-03 NOTE — Telephone Encounter (Addendum)
Attempted to call wife.  No answer.  I called and spoke to Amy with [facility].  Pt alert , but tired when left.  His Bp 78/54 pushing fluids. Taking 25/100 mg 1/2 tablet first 2 doses then 1 tablet 4 doses. Then CR 50/200mg  at bedtime.  Not started using compression stockings.  (Did find thigh high as we were speaking).  Pt having Bp's that did drop 56/43 at 0840low, to  1120 116-79 this morning.  She hydrates him and it eventually comes back up but still has issues.  Any other recommendations.

## 2021-07-06 NOTE — Telephone Encounter (Signed)
I discussed his blood pressure issue with Dr. Rexene Alberts at the last visit.  The patient needs to try to use the compression stockings to help manage his blood pressure.  If his blood pressure remains an issue despite using compression stockings then he should let us know.  Patient should continue to stay well-hydrated not just when blood pressure drops

## 2021-07-06 NOTE — Telephone Encounter (Addendum)
Pt is wearing compression (knee high),  Bp still low in am 68/53 66/47 then will eventually go back up 110/71 around 1100 this with pushing fluids during morning.  Wife concerned.  I relayed will let Dr. Rexene Alberts know as well. I did let wife know what MM/NP states and she is doing what they asked not making much of difference.

## 2021-07-10 MED ORDER — FLUDROCORTISONE ACETATE 0.1 MG PO TABS: 0.0500 mg | ORAL_TABLET | Freq: Every day | ORAL | 3 refills | Status: DC

## 2021-07-10 NOTE — Telephone Encounter (Signed)
Please call patient or his wife back, we can start him on a medication to help increase his blood pressure in the morning.  He can take half a pill of Florinef in the mornings, I sent a new prescription to his pharmacy.  I would recommend she continue to monitor his blood pressure, side effects can include high blood pressure.

## 2021-08-23 ENCOUNTER — Other Ambulatory Visit: Payer: Self-pay | Admitting: Neurology

## 2021-11-01 ENCOUNTER — Ambulatory Visit (INDEPENDENT_AMBULATORY_CARE_PROVIDER_SITE_OTHER): Payer: Medicare Other | Admitting: Neurology

## 2021-11-01 ENCOUNTER — Encounter: Payer: Self-pay | Admitting: Neurology

## 2021-11-01 VITALS — BP 107/66 | HR 87 | Ht 70.0 in | Wt 191.4 lb

## 2021-11-01 DIAGNOSIS — R269 Unspecified abnormalities of gait and mobility: Secondary | ICD-10-CM

## 2021-11-01 DIAGNOSIS — R441 Visual hallucinations: Secondary | ICD-10-CM | POA: Diagnosis not present

## 2021-11-01 DIAGNOSIS — Z9181 History of falling: Secondary | ICD-10-CM

## 2021-11-01 DIAGNOSIS — G20 Parkinson's disease: Secondary | ICD-10-CM | POA: Diagnosis not present

## 2021-11-01 DIAGNOSIS — R413 Other amnesia: Secondary | ICD-10-CM | POA: Diagnosis not present

## 2021-11-01 DIAGNOSIS — G249 Dystonia, unspecified: Secondary | ICD-10-CM

## 2021-11-01 DIAGNOSIS — G4752 REM sleep behavior disorder: Secondary | ICD-10-CM

## 2021-11-01 MED ORDER — FLUDROCORTISONE ACETATE 0.1 MG PO TABS: 0.0500 mg | ORAL_TABLET | Freq: Every day | ORAL | 5 refills | Status: DC

## 2021-11-01 NOTE — Progress Notes (Signed)
Subjective:    Patient ID: Danny [name] is a 70 y.o. male.  HPI    Interim history:   [name] is a very pleasant 70 year old right-handed gentleman with an underlying medical history of type 2 diabetes, osteoarthritis, status post right knee replacement in March 2021, hypertension, obesity, hyperlipidemia, and cataracts, who presents for follow-up consultation of his Parkinson's disease, complicated by decline in mobility, knee pain on the R, history of IBD, memory loss, hallucinations, and recurrent falls.  The patient is accompanied by his wife again today.  I last saw him on 01/11/2021, at which time we mutually agreed to continue his medication regimen.  He was on low-dose Seroquel per primary care.  He was on Florinef for low blood pressure values.  He saw Danny Browner, NP in the interim on 05/25/2021, at which time his wife reported frequent falls.  He was kept on the same medication regimen.    Today, 11/01/2021: He reports very little but is able to answer questions appropriately.  Most of his history is provided by his wife.  She reports no recent falls, motor wise he has had a steady decline in his fine motor skills and mobility, more hallucinations which are daytime and nighttime.  Unfortunately, he could not consistently take the Nuplazid and as far as she recalls he also had worsening hallucinations or no benefit from it when he was on a trial of it and Nourianz caused worsening hallucinations.  His blood pressure is generally lower in the first part of the day and she gives him half a pill of Florinef.  His appetite is fairly good but he eats very slowly and eats less than he used to.  He had an eye examination but it was difficult to do a full exam per wife's report.  His prescription has not changed much but he will need new glasses.  He has prism eyeglasses.  She prescription for a manual wheelchair, they have 1 at the house but she also needs another 1 so she can keep it in the  car.  She does not typically leave them alone at the house for fear of falls and exacerbation of hallucinations.  The patient's allergies, current medications, family history, past medical history, past social history, past surgical history and problem list were reviewed and updated as appropriate.      Previously (copied from previous notes for reference):   I saw him on 10/11/20, at which time he reported lack of stamina and feeling overall weaker, having more freezing and more off time.  He did not increase his Sinemet to 1 pill 6 times a day as discussed during the visit prior.  He had lost quite a bit of weight.  He had some low blood pressure values.  He was advised to increase his Sinemet only mutually agreed to try him on Nourianz.   His wife called about a month later reporting that he had more hallucinations on the [place].  Unfortunately, they were not able to afford the Nuplazid.     I saw him on 07/06/2020, at which time he had more difficulty relating his own history and he had more decline in his motor function.  He had fallen multiple times.  He had knee replacement surgery.  They could not afford the prescription for Nuplazid long-term.  He was advised to increase his Sinemet to 1 pill 6 times a day and continue with the Sinemet CR at bedtime.  He had stable hallucinations.  He  continues to take low-dose Mirapex 0.5 mg 3 times daily.    I saw him on 02/11/2020, at which time we talked about his medication regimen, recent fall, and he needed clearance for his right knee replacement.   He had an appointment in the interim with Debbora Presto, nurse practitioner on 05/23/2020, at which time Sinemet CR was added at bedtime and he was advised to start Nuplazid.   However, his wife reported back that Nuplazid was not started secondary to cost.         I saw him on 10/05/2019, at which time he reported feeling tired during the day.  His wife endorsed that he was quite sleepy during the day,  particularly in the early evenings.  He has had some falls.  He had bruised his sternum and ribs.  He did fall in his kitchen and hit his head against a curtain holder and had a small area of skin injury in the right forehead.  He was advised to continue with Sinemet 1 pill 5 times a day but advised to further reduce his Mirapex to 0.5 mg 3 times daily.  I asked him to have a head CT done.  He had a head CT without contrast on 10/08/2020 and I reviewed the results: IMPRESSION:    Normal CT head (without).  We called him with his test results.     I saw him on 06/04/2019, at which time he reported difficulty with his mobility.  He had fallen, thankfully without major injuries.  His wife had noticed more forgetfulness and some confusion at times.  He was more sleepy during the day.  He was taking Sinemet 4 times a day and had more difficulty in the mornings.  He was on Mirapex 3 times a day.  He had intermittent constipation.  He reported fleeting hallucinations, nothing sustained or anxiety provoking.  He was advised to increase his Sinemet to 1 pill 5 times a day at 3 hourly intervals starting at 8 AM.  He was advised to continue with Mirapex 3 times daily.  He was advised to be very proactive about constipation issues.    I saw him on 12/03/2018, at which time he was advised to continue with Sinemet 1 pill 4 times a day.  He did have her fall incident after he missed the chair when sitting down.  His memory was stable for the most part.  He was advised to hydrate well and be proactive about constipation issues, we talked about fall prevention.     I saw him on 06/05/2018, at which time he felt fairly stable but his wife had noticed more shuffling and also some mood irritability. He had no falls thankfully and some forgetfulness was noted. He was more sleepy during the day. Suggested he continue with generic Mirapex 0.75 mg 3 times a day but increase his Sinemet from 3 times a day to 1 pill 4 times a  day.   His wife called in the interim in October 2019 requesting a letter to support their cancellation of their timeshare as he was not able to drive and they were not able to utilize their timeshare.    I saw him on 12/05/2017, at which time he felt fairly stable on Sinemet, we had reduced his Mirapex to 0.75 mg 3 times a day. He is trying to stay active. He had no recent falls. I suggested we continue with his Sinemet and Mirapex at the current doses.   I saw  him on 09/09/2017 at which time he had been more sleepy during the day. His wife was worried that he was more preoccupied with sex. He had ongoing issues with intermittent double vision. He had intermittent and mild dream enactments. Suggested we gradually start him on low-dose Sinemet starting with half pill and gradually increasing this to 1 pill 3 times a day. His wife called in the interim in October reporting that he had done fairly well with Sinemet, at which time we reduced his Mirapex from 1 mg 3 times a day to 0.75 mg 3 times a day.      I saw him on 03/04/2017, at which time he felt stable. He retired in December 2017. The daughter was living with them. Mother-in-law had moved in as well. His wife retired also. He was going to the gym on a regular basis. Memory-wise, he had mild forgetfulness, no mood issues, no sleep issues, no side effects from the Mirapex. He had no recent falls thankfully. He had some mild dream enactments but no major issues. Looking back, his symptoms of peak date back to 2014, he had a sleep study in early 2014 with PLMS noted but no OSA and he denies restless leg symptoms. We mutually agreed to taper him off of Azilect as it was too expensive. I suggested in lieu of the Azilect, we increase his pramipexole to 1 mg 3 times a day.     I saw him on 09/03/2016, at which time he reported doing well, no telltale differences after increasing the Mirapex. He gave up driving. He decided to retire by the end of December  2017. His mother-in-law was supposed to move in with them. His memory was stable, sleep was stable, no real mood issues, no recent falls. I suggested we keep his medications the same including Azilect 1 mg once daily, Mirapex 0.75 mg 3 times a day.   I saw him on 02/27/2016, at which time he reported more difficulty with his walking, he had become slower. Wife had noted that it would take him longer to do things. He was dragging his feet at times. He was not swinging his arms as well. He was working full-time as a Land, avoiding stairs and heights. His A1c in December 2016 was a little up at 7.6. Memory was stable, with some mild forgetfulness. Mood was stable, RBD was infrequent. He reported no recent falls. I suggested he continue with Azilect and we increased his Mirapex to 0.75 mg 3 times a day.    I saw him on 08/29/2015 at which time he reported doing fairly well. He was able to tolerate Azilect. His Mirapex was 0.5 mg 3 times a day. He was working at the computer most of the time. He was not always drinking enough water. He had a good appetite. He felt his memory was stable. RBD was less frequent but still occurring. His wife was able to sleep in the same bed with him. He had no recent falls. He had some recent blurry vision was going to see his ophthalmologist. We mutually agreed to continue his current medication regimen.   I saw him on 04/25/2015, at which time he reported doing fairly well overall, but had noted smaller changes, including more slowness, difficulty with climbing ladders. He was advised by his boss to not climb ladders or walk over obstacles at work. Thankfully, his work environment is understanding. He had another business trip coming up and a colleague was to drive and  do the climbing on the sites. He reported mild forgetfulness and some problems with depth perception. His diplopia had improved since he was given prism glasses by his ophthalmologist. I suggested, we  try him on Azilect and keep his Mirapex the same.    I saw him on 02/04/2015, at which time he reported ongoing issues with diplopia, particularly at night while driving. He was still working full-time. He was still on low-dose Mirapex, 0.25 mg 3 times a day. He had no side effects. He had had a sleep study which per his report did not show any OSA. I asked him to make an appointment with his eye doctor. I suggested an increase in Mirapex to 0.5 mg 3 times a day.   I first met him on 12/28/2014, at which time he reported that he was having more fatigue. However, he also had reduce his caffeine intake. He had no recent falls and mood stable. He was working full-time. He reported that he had intermittent double vision in the last 3 months. His PCP requested a sooner than scheduled appointment for diplopia. His exam was stable at the time and I suggested no new medication changes.   He has had some short-term memory issues. He works full-time. He is an Chief Financial Officer. He likes square dance but his wife has noted that he has had more problems with his nighttime driving and his squared hands. In the recent 3 months he has had some intermittent double vision at night. He has drooling at night. He rarely drinks alcohol and usually drinks 4-5 cups of coffee per day, occasional sodas. He quit smoking in 1978. Blood pressure is low for him today. It was recently noted to be trending lower and his blood pressure medication was reduced. He takes his cholesterol medication every other day.   He had a brain MRI without contrast on 11/04/2013: Normal MRI scan of the brain. Incidental findings of a chronic paranasal sinusitis with deviated nasal septum to the left.  He previously followed with Dr. Jim Like and was last seen by him on 08/30/2014, at which time his Mirapex was kept at 0.25 mg 3 times a day. He reported no side effects, in particular no impulse control disorder. I reviewed Dr. Hazle Quant notes. He first met Dr.  Janann Colonel on 10/26/13, at which time the patient reported a right hand tremor noticed over the previous 4-6 months. He had changes in his handwriting, slowness, some stiffness and changes in his walking.  His wife reported a longer standing history of REM behavior disorder. He has been in outpatient physical therapy.  His Past Medical History Is Significant For: Past Medical History:  Diagnosis Date   Allergy    SEASONAL   Cataract    BILATERAL-REMOVED   Essential hypertension, benign    PT.DENIES ON AS PREVENTIVE 08/17/19   GERD (gastroesophageal reflux disease)    Hx of adenomatous polyp of colon 09/04/2019   08/2019 diminutive adenoma No recall given findings and age   Mixed hyperlipidemia    PT.DENIES,STATED ON AS PREVENTIVE 07/30/19   Neuromuscular disorder (HCC)    PARKINSONS   OA (osteoarthritis) of knee    right   Obesity, unspecified    Osteoarthritis    KNEE AND SHOULDERS   Parkinsons ([place])    Type II or unspecified type diabetes mellitus without mention of complication, not stated as uncontrolled     His Past Surgical History Is Significant For: Past Surgical History:  Procedure Laterality Date   CATARACT  EXTRACTION, BILATERAL     GANGLION CYST EXCISION  age 36 years   right   KNEE ARTHROSCOPY     right   KNEE ARTHROSCOPY     TOTAL KNEE ARTHROPLASTY Right 03/08/2020   Procedure: RIGHT TOTAL KNEE ARTHROPLASTY;  Surgeon: Garald Balding, MD;  Location: [facility];  Service: Orthopedics;  Laterality: Right;   WRIST FRACTURE SURGERY     WRIST SURGERY  04/2012    His Family History Is Significant For: Family History  Problem Relation Age of Onset   Diabetes Mother    Lung cancer Mother    Cancer Father    Heart disease Father    Diabetes Sister    Diabetes Daughter    Colon polyps Son    Colon cancer Neg Hx    Esophageal cancer Neg Hx    Rectal cancer Neg Hx    Stomach cancer Neg Hx    Parkinson's disease Neg Hx     His Social History Is Significant For: Social  History   Socioeconomic History   Marital status: Married    Spouse name: [name]   Number of children: 3   Years of education: 16   Highest education level: Bachelor's degree (e.g., BA, AB, BS)  Occupational History   Occupation: Chief Financial Officer  Tobacco Use   Smoking status: Former    Packs/day: 1.50    Years: 15.00    Pack years: 22.50    Types: Cigarettes    Quit date: 12/17/1982    Years since quitting: 38.9   Smokeless tobacco: Never   Tobacco comments:    over 40 yrs  Vaping Use   Vaping Use: Never used  Substance and Sexual Activity   Alcohol use: Yes    Comment: very rare   Drug use: No   Sexual activity: Yes    Partners: Female  Other Topics Concern   Not on file  Social History Narrative   Research officer, trade union, motorcycle rider.  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  He has 3 adult children.  His wife had 2 children, one of whom is deceased.   Patient is working full-time.   Patient has a Bachelor's degree.   Patient is right-handed.   Patient drinks 6 cups of coffee daily.   Social Determinants of Health   Financial Resource Strain: Not on file  Food Insecurity: Not on file  Transportation Needs: Not on file  Physical Activity: Not on file  Stress: Not on file  Social Connections: Not on file    His Allergies Are:  Allergies  Allergen Reactions   Codeine Nausea And Vomiting  :   His Current Medications Are:  Outpatient Encounter Medications as of 11/01/2021  Medication Sig   Ascorbic Acid (VITAMIN C ADULT GUMMIES PO) Take 2 tablets by mouth daily.    aspirin 81 MG EC tablet Take 81 mg by mouth daily. Swallow whole.   carbidopa-levodopa (SINEMET CR) 50-200 MG tablet Take 1 tablet by mouth at bedtime.   carbidopa-levodopa (SINEMET IR) 25-100 MG tablet Take 1 tablet by mouth 6 (six) times daily.   Cinnamon 500 MG capsule Take 500 mg by mouth daily.   famotidine (PEPCID) 20 MG tablet Take 20 mg by mouth in the morning and at bedtime.    fludrocortisone (FLORINEF) 0.1 MG  tablet Take 0.5 tablets (0.05 mg total) by mouth daily.   Ginger, Zingiber officinalis, 550 MG CAPS Take 550 mg by mouth daily.   glipiZIDE (GLUCOTROL XL) 10 MG 24 hr  tablet TAKE 1 TABLET BY MOUTH DAILY (Patient taking differently: Take 10 mg by mouth at bedtime.)   Glucosamine-Chondroit-Vit C-Mn (GLUCOSAMINE CHONDR 1500 COMPLX PO) Take 1,500 mg by mouth daily.   ketoconazole (NIZORAL) 2 % cream Apply 1 application topically 2 (two) times daily.    metFORMIN (GLUCOPHAGE) 1000 MG tablet Take 1 tablet (1,000 mg total) by mouth 2 (two) times daily with a meal.   Multiple Vitamin (MULTIVITAMIN) tablet Take 1 tablet by mouth daily.   pramipexole (MIRAPEX) 0.5 MG tablet Take 1 tablet (0.5 mg total) by mouth 3 (three) times daily.   QUEtiapine (SEROQUEL) 50 MG tablet Take by mouth.   No facility-administered encounter medications on file as of 11/01/2021.  :  Review of Systems:  Out of a complete 14 point review of systems, all are reviewed and negative with the exception of these symptoms as listed below:   Review of Systems  Neurological:        Pt is here for parkinson's follow up . Pt wife states his speech is mumbled more. Pt wife states he has fatigue throughout the day. Pt is having more hallucinations. Pt wife states he doesn't look her in the eye when he talks to her.    Objective:  Neurological Exam  Physical Exam Physical Examination:   Vitals:   11/01/21 1024  BP: 107/66  Pulse: 87    General Examination: The patient is a very pleasant 70 y.o. male in no acute distress. He appears well.  Well-groomed.  In a wheelchair.   HEENT: Normocephalic, atraumatic, pupils are equal, round and reactive to light, extraocular tracking is difficult for him, significant forward flexion of his neck is noted and he is leaning forward and to the right in the wheelchair.  He wears prism eyeglasses.  Hearing is grossly intact.  Speech is hypophonic and slightly dysarthric at times.  Face is  moderately masked, neck is moderately rigid. Airway examination is limited, mild mouth dryness.     Chest: Clear to auscultation without wheezing, rhonchi or crackles noted.   Heart: S1+S2+0, regular and normal without murmurs, rubs or gallops noted.    Abdomen: Soft, non-tender and non-distended.  Extremities: There is no pitting edema in the distal lower extremities bilaterally.    Skin: Warm and dry without trophic changes noted.   Musculoskeletal: exam reveals no new changes.     Neurologically:  Mental status: The patient is awake, alert and oriented in all 4 spheres. His immediate and remote memory, attention, language skills and fund of knowledge are mildly impaired. His history is primarily provided by his wife. Speech as above. Thought process is linear. Mood is normal and affect is normal otherwise.    On 06/04/2019: MMSE: 25/30, CDT: 2/4, AFT: 14/min.   Cranial nerves II - XII are as described above under HEENT exam.  Motor exam: Bulk, global strength of 4+5, increase in tone particularly on the right side.  Moderate to severe bradykinesia noted.  No significant resting tremor noted today, no obvious dyskinesias today.  No active hallucinations currently. Fine motor skills are severely impaired on the right and moderate to severely impaired on the left.   Sensory exam is intact to light touch.  Cerebellar testing shows no dysmetria or intention tremor. Gait, station and balance: He is in a wheelchair. I did not have him stand or walk for me today.       Assessment and plan:    In summary, LADALE SHERBURN is a very  pleasant 70 year old male with an underlying medical history of type 2 diabetes, osteoarthritis, hypertension, obesity, hyperlipidemia, cataracts with status post surgeries, status post right knee replacement surgery, Hx of falls, who presents for follow-up consultation of his right-sided predominant Parkinson's disease, complicated by hallucinations, REM behavior  disorder, daytime somnolence, memory loss, recurrent falls, freezing, and constipation and dyskinesias. His motor symptoms date back to around 04/2013 and RBD symptoms preceding motor symptoms by perhaps 12-15 years even. He had done fairly well over the past years but he has had progressive decline in his motor function and balance as well as some dyskinesias and he has had fairly consistent hallucinations in the past 2-3 years. We talked about the challenges of advancing Parkinson's disease at length before.  She has had therapy.  He has had mild memory loss over time.  We will continue to monitor and maybe consider memory medication down the road.  He is on Mirapex 0.5 mg 3 times daily, Sinemet 1 pill 6 times a day, at 8 AM, 10:30 AM, 1 PM, 3:30 PM, 6 PM and 8:30 PM.  They did not actually implement this change in July 2021 or in 10/21. He has been on Seroquel qHS, which is now at 50 mg. He is on low dose Florinef daily.  Unfortunately, he was not able to get Nuplazid due to cost.  In October 2021 we tried Nourianz 20 mg once daily for increase in off time but after about a month his wife called back and reported that he was having more hallucinations on it so we decided to stop it. They are advised to continue to stay proactive about constipation issues. He is advised to follow-up routinely in 4 months to see one of our nurse practitioners, sooner if needed.  I put in a prescription for a manual wheelchair. I answered all their questions today and they were in agreement. I spent 40 minutes in total face-to-face time and in reviewing records during pre-charting, more than 50% of which was spent in counseling and coordination of care, reviewing test results, reviewing medications and treatment regimen and/or in discussing or reviewing the diagnosis of PD, the prognosis and treatment options. Pertinent laboratory and imaging test results that were available during this visit with the patient were reviewed by me  and considered in my medical decision making (see chart for details).

## 2021-11-01 NOTE — Patient Instructions (Signed)
It was nice to see you again today.  As discussed, we will keep the medication regimen the same.  I renewed your Florinef prescription and everything else should be good for now.  I wrote for a manual wheelchair, we will send the order to [facility] and they should be in touch with you directly.  If you do not hear from them in the next 2 weeks, call us back. Please follow-up to see one of our nurse practitioners in about 4 months.

## 2021-11-06 ENCOUNTER — Telehealth: Payer: Self-pay | Admitting: Neurology

## 2021-11-06 NOTE — Telephone Encounter (Signed)
I called pt, spoke to wife.  Pt is in the pcp office now.  Pt had issues with speak and weakness.  They think possible UTI will check or ?? TIA.  Wife will keep [place] informed.  We have appt with MM/NP in 02/2021 for f/u PD.  Wife appreciated call back.

## 2021-11-06 NOTE — Telephone Encounter (Signed)
Pt's wife [name] called stating there has been a huge decline in her husbands health especially his speech. [name] is requesting a call back.

## 2021-11-08 ENCOUNTER — Emergency Department (HOSPITAL_COMMUNITY): Payer: Medicare Other

## 2021-11-08 ENCOUNTER — Encounter (HOSPITAL_COMMUNITY): Payer: Self-pay

## 2021-11-08 ENCOUNTER — Emergency Department (HOSPITAL_COMMUNITY)
Admission: EM | Admit: 2021-11-08 | Discharge: 2021-11-08 | Disposition: A | Discharge status: Home / Self Care | Payer: Medicare Other | Attending: Emergency Medicine | Admitting: Emergency Medicine

## 2021-11-08 ENCOUNTER — Other Ambulatory Visit: Payer: Self-pay

## 2021-11-08 DIAGNOSIS — Z87891 Personal history of nicotine dependence: Secondary | ICD-10-CM | POA: Insufficient documentation

## 2021-11-08 DIAGNOSIS — R4781 Slurred speech: Secondary | ICD-10-CM | POA: Diagnosis not present

## 2021-11-08 DIAGNOSIS — Z79899 Other long term (current) drug therapy: Secondary | ICD-10-CM | POA: Insufficient documentation

## 2021-11-08 DIAGNOSIS — Z7982 Long term (current) use of aspirin: Secondary | ICD-10-CM | POA: Diagnosis not present

## 2021-11-08 DIAGNOSIS — Z20822 Contact with and (suspected) exposure to covid-19: Secondary | ICD-10-CM | POA: Diagnosis not present

## 2021-11-08 DIAGNOSIS — I10 Essential (primary) hypertension: Secondary | ICD-10-CM | POA: Insufficient documentation

## 2021-11-08 DIAGNOSIS — E119 Type 2 diabetes mellitus without complications: Secondary | ICD-10-CM | POA: Diagnosis not present

## 2021-11-08 DIAGNOSIS — Z7984 Long term (current) use of oral hypoglycemic drugs: Secondary | ICD-10-CM | POA: Insufficient documentation

## 2021-11-08 DIAGNOSIS — Z96651 Presence of right artificial knee joint: Secondary | ICD-10-CM | POA: Insufficient documentation

## 2021-11-08 DIAGNOSIS — H538 Other visual disturbances: Secondary | ICD-10-CM | POA: Insufficient documentation

## 2021-11-08 DIAGNOSIS — G20 Parkinson's disease: Secondary | ICD-10-CM | POA: Diagnosis not present

## 2021-11-08 LAB — URINALYSIS, ROUTINE W REFLEX MICROSCOPIC
Bilirubin Urine: NEGATIVE
Glucose, UA: NEGATIVE mg/dL
Hgb urine dipstick: NEGATIVE
Ketones, ur: NEGATIVE mg/dL
Leukocytes,Ua: NEGATIVE
Nitrite: NEGATIVE
Protein, ur: NEGATIVE mg/dL
Specific Gravity, Urine: 1.008 (ref 1.005–1.030)
pH: 5 (ref 5.0–8.0)

## 2021-11-08 LAB — RESP PANEL BY RT-PCR (FLU A&B, COVID) ARPGX2
Influenza A by PCR: NEGATIVE
Influenza B by PCR: NEGATIVE
SARS Coronavirus 2 by RT PCR: NEGATIVE

## 2021-11-08 LAB — CBG MONITORING, ED: Glucose-Capillary: 100 mg/dL — ABNORMAL HIGH (ref 70–99)

## 2021-11-08 LAB — CBC WITH DIFFERENTIAL/PLATELET
Abs Immature Granulocytes: 0.05 10*3/uL (ref 0.00–0.07)
Basophils Absolute: 0.1 10*3/uL (ref 0.0–0.1)
Basophils Relative: 1 %
Eosinophils Absolute: 0.3 10*3/uL (ref 0.0–0.5)
Eosinophils Relative: 4 %
HCT: 43.5 % (ref 39.0–52.0)
Hemoglobin: 14.3 g/dL (ref 13.0–17.0)
Immature Granulocytes: 1 %
Lymphocytes Relative: 26 %
Lymphs Abs: 2.1 10*3/uL (ref 0.7–4.0)
MCH: 31.3 pg (ref 26.0–34.0)
MCHC: 32.9 g/dL (ref 30.0–36.0)
MCV: 95.2 fL (ref 80.0–100.0)
Monocytes Absolute: 0.7 10*3/uL (ref 0.1–1.0)
Monocytes Relative: 9 %
Neutro Abs: 4.8 10*3/uL (ref 1.7–7.7)
Neutrophils Relative %: 59 %
Platelets: 176 10*3/uL (ref 150–400)
RBC: 4.57 MIL/uL (ref 4.22–5.81)
RDW: 14.1 % (ref 11.5–15.5)
WBC: 8 10*3/uL (ref 4.0–10.5)
nRBC: 0 % (ref 0.0–0.2)

## 2021-11-08 LAB — COMPREHENSIVE METABOLIC PANEL
ALT: 9 U/L (ref 0–44)
AST: 16 U/L (ref 15–41)
Albumin: 3.6 g/dL (ref 3.5–5.0)
Alkaline Phosphatase: 58 U/L (ref 38–126)
Anion gap: 10 (ref 5–15)
BUN: 15 mg/dL (ref 8–23)
CO2: 22 mmol/L (ref 22–32)
Calcium: 8.9 mg/dL (ref 8.9–10.3)
Chloride: 105 mmol/L (ref 98–111)
Creatinine, Ser: 1.15 mg/dL (ref 0.61–1.24)
GFR, Estimated: >60 mL/min (ref >60)
Glucose, Bld: 95 mg/dL (ref 70–99)
Potassium: 4.4 mmol/L (ref 3.5–5.1)
Sodium: 137 mmol/L (ref 135–145)
Total Bilirubin: 1.5 mg/dL — ABNORMAL HIGH (ref 0.3–1.2)
Total Protein: 6.2 g/dL — ABNORMAL LOW (ref 6.5–8.1)

## 2021-11-08 MED ORDER — IOHEXOL 350 MG/ML SOLN
100.0000 mL | Freq: Once | INTRAVENOUS | Status: AC | PRN
  Administered 2021-11-08: 75 mL via INTRAVENOUS

## 2021-11-08 NOTE — Discharge Instructions (Signed)
Work-up today was unremarkable.  Please follow-up with your neurologist and if you develop any concerning symptoms please return.

## 2021-11-08 NOTE — ED Provider Notes (Signed)
[facility] EMERGENCY DEPARTMENT Provider Note   CSN: [id number] Arrival date & time: 11/08/21  1040     History Chief Complaint  Patient presents with   Aphasia    Danny [name] is a 70 y.o. male.  HPI      70 year old male with history of hypertension, hyperlipidemia, Parkinson's disease, diabetes, presents with concern for waxing and waning slurred speech over the last 6 days.  He reports that the symptoms have never completely resolved, but have been waxing and waning, improving to up to 90% of what his prior baseline cause.  Wife reports that he has been very difficult to understand, slurring his words.  He has not appeared to have any word finding difficulties, and he reports that he did not notice the slurring as much as his wife did.  Over the weekend, his symptoms were more severe.  At that time he also had difficulty walking and felt that both of his legs were heavy.  Describes having some visual changes that they report is secondary to his Parkinson medication, but has not had acute changes today.  Denies new numbness, focal weakness, facial droop, chest pain, shortness of breath, abdominal pain, nausea, vomiting, diarrhea.  Yesterday he was able to walk around okay with his walker.  He felt faint this AM.  Had labs done and saw PCP Monday.  Pt initally reported symptoms for 12 days, wife reports has been 6.   Past Medical History:  Diagnosis Date   Allergy    SEASONAL   Cataract    BILATERAL-REMOVED   Essential hypertension, benign    PT.DENIES ON AS PREVENTIVE 08/17/19   GERD (gastroesophageal reflux disease)    Hx of adenomatous polyp of colon 09/04/2019   08/2019 diminutive adenoma No recall given findings and age   Mixed hyperlipidemia    PT.DENIES,STATED ON AS PREVENTIVE 07/30/19   Neuromuscular disorder (HCC)    PARKINSONS   OA (osteoarthritis) of knee    right   Obesity, unspecified    Osteoarthritis    KNEE AND SHOULDERS   Parkinsons  ([place])    Type II or unspecified type diabetes mellitus without mention of complication, not stated as uncontrolled     Patient Active Problem List   Diagnosis Date Noted   Hemarthrosis of right knee 06/30/2020   Heterotopic calcification, postoperative 06/30/2020   Total knee replacement status, right 03/22/2020   Hematospermia 11/18/2015   Epididymal cyst 05/25/2015   Parkinson disease ([place]) 01/14/2014   Obesity (BMI 30-39.9) 07/14/2013   Confusional arousals 02/13/2013   Disorder of ligament of right wrist 01/29/2012   Diabetes mellitus type 2, uncomplicated ([place]) 37/09/6268   Mixed hyperlipidemia 10/09/2011   Unilateral primary osteoarthritis, right knee 10/09/2011   Essential hypertension, benign 10/09/2011    Past Surgical History:  Procedure Laterality Date   CATARACT EXTRACTION, BILATERAL     GANGLION CYST EXCISION  age 81 years   right   KNEE ARTHROSCOPY     right   KNEE ARTHROSCOPY     TOTAL KNEE ARTHROPLASTY Right 03/08/2020   Procedure: RIGHT TOTAL KNEE ARTHROPLASTY;  Surgeon: Garald Balding, MD;  Location: [facility];  Service: Orthopedics;  Laterality: Right;   WRIST FRACTURE SURGERY     WRIST SURGERY  04/2012       Family History  Problem Relation Age of Onset   Diabetes Mother    Lung cancer Mother    Cancer Father    Heart disease Father  Diabetes Sister    Diabetes Daughter    Colon polyps Son    Colon cancer Neg Hx    Esophageal cancer Neg Hx    Rectal cancer Neg Hx    Stomach cancer Neg Hx    Parkinson's disease Neg Hx     Social History   Tobacco Use   Smoking status: Former    Packs/day: 1.50    Years: 15.00    Pack years: 22.50    Types: Cigarettes    Quit date: 12/17/1982    Years since quitting: 38.9   Smokeless tobacco: Never   Tobacco comments:    over 40 yrs  Vaping Use   Vaping Use: Never used  Substance Use Topics   Alcohol use: Yes    Comment: very rare   Drug use: No    Home Medications Prior to Admission  medications   Medication Sig Start Date End Date Taking? Authorizing Provider  Ascorbic Acid (VITAMIN C ADULT GUMMIES PO) Take 2 tablets by mouth daily.     [provider]  aspirin 81 MG EC tablet Take 81 mg by mouth daily. Swallow whole.    [provider]  carbidopa-levodopa (SINEMET CR) 50-200 MG tablet Take 1 tablet by mouth at bedtime. 05/29/21   Ward Givens, NP  carbidopa-levodopa (SINEMET IR) 25-100 MG tablet Take 1 tablet by mouth 6 (six) times daily. 01/11/21   [name], MD  Cinnamon 500 MG capsule Take 500 mg by mouth daily.    [provider]  famotidine (PEPCID) 20 MG tablet Take 20 mg by mouth in the morning and at bedtime.     [provider]  fludrocortisone (FLORINEF) 0.1 MG tablet Take 0.5 tablets (0.05 mg total) by mouth daily. 11/01/21   [name], MD  Ginger, Zingiber officinalis, 550 MG CAPS Take 550 mg by mouth daily.    [provider]  glipiZIDE (GLUCOTROL XL) 10 MG 24 hr tablet TAKE 1 TABLET BY MOUTH DAILY Patient taking differently: Take 10 mg by mouth at bedtime. 10/15/17   [name], PA  Glucosamine-Chondroit-Vit C-Mn (GLUCOSAMINE CHONDR 1500 COMPLX PO) Take 1,500 mg by mouth daily.    [provider]  ketoconazole (NIZORAL) 2 % cream Apply 1 application topically 2 (two) times daily.  08/10/19   [provider]  metFORMIN (GLUCOPHAGE) 1000 MG tablet Take 1 tablet (1,000 mg total) by mouth 2 (two) times daily with a meal. 03/11/20   Petrarca, Mike Craze, PA-C  Multiple Vitamin (MULTIVITAMIN) tablet Take 1 tablet by mouth daily.    [provider]  pramipexole (MIRAPEX) 0.5 MG tablet Take 1 tablet (0.5 mg total) by mouth 3 (three) times daily. 08/23/21   [name], MD  QUEtiapine (SEROQUEL) 50 MG tablet Take by mouth. 01/10/21   [provider]    Allergies    Codeine  Review of Systems   Review of Systems  Constitutional:  Negative for fever.  HENT:  Negative for sore  throat.   Eyes:  Negative for visual disturbance (denies acute change, reports has had secondary to medications and sees eye dr).  Respiratory:  Negative for cough and shortness of breath.   Cardiovascular:  Negative for chest pain.  Gastrointestinal:  Negative for abdominal pain, diarrhea, nausea and vomiting.  Genitourinary:  Negative for difficulty urinating.  Musculoskeletal:  Negative for back pain and neck stiffness.  Skin:  Negative for rash.  Neurological:  Positive for dizziness, speech difficulty and weakness (over weakend bilateral LE  felt heavy, now deniesa ny focal weakness). Negative for syncope, facial asymmetry, numbness and headaches.   Physical Exam Updated Vital Signs BP (!) 183/94 (BP Location: Left Arm)   Pulse 63   Temp 98 F (36.7 C) (Oral)   Resp 20   Ht 5\' 10"  (1.778 m)   Wt 86.8 kg   SpO2 98%   BMI 27.46 kg/m   Physical Exam Vitals and nursing note reviewed.  Constitutional:      General: He is not in acute distress.    Appearance: Normal appearance. He is well-developed. He is not ill-appearing or diaphoretic.  HENT:     Head: Normocephalic and atraumatic.  Eyes:     General: No visual field deficit.    Extraocular Movements: Extraocular movements intact.     Conjunctiva/sclera: Conjunctivae normal.     Pupils: Pupils are equal, round, and reactive to light.  Cardiovascular:     Rate and Rhythm: Normal rate and regular rhythm.     Pulses: Normal pulses.     Heart sounds: Normal heart sounds. No murmur heard.   No friction rub. No gallop.  Pulmonary:     Effort: Pulmonary effort is normal. No respiratory distress.     Breath sounds: Normal breath sounds. No wheezing or rales.  Abdominal:     General: There is no distension.     Palpations: Abdomen is soft.     Tenderness: There is no abdominal tenderness. There is no guarding.  Musculoskeletal:        General: No swelling or tenderness.     Cervical back: Normal range of motion.  Skin:     General: Skin is warm and dry.     Findings: No erythema or rash.  Neurological:     General: No focal deficit present.     Mental Status: He is alert and oriented to person, place, and time.     GCS: GCS eye subscore is 4. GCS verbal subscore is 5. GCS motor subscore is 6.     Cranial Nerves: No cranial nerve deficit, dysarthria or facial asymmetry.     Sensory: No sensory deficit.     Motor: No weakness or tremor.     Coordination: Coordination normal. Finger-Nose-Finger Test normal.     Gait: Gait normal.    ED Results / Procedures / Treatments   Labs (all labs ordered are listed, but only abnormal results are displayed) Labs Reviewed  COMPREHENSIVE METABOLIC PANEL - Abnormal; Notable for the following components:      Result Value   Total Protein 6.2 (*)    Total Bilirubin 1.5 (*)    All other components within normal limits  CBG MONITORING, ED - Abnormal; Notable for the following components:   Glucose-Capillary 100 (*)    All other components within normal limits  RESP PANEL BY RT-PCR (FLU A&B, COVID) ARPGX2  URINE CULTURE  CBC WITH DIFFERENTIAL/PLATELET  URINALYSIS, ROUTINE W REFLEX MICROSCOPIC    EKG EKG Interpretation  Date/Time:  Wednesday November 08 2021 11:48:01 EST Ventricular Rate:  68 PR Interval:  152 QRS Duration: 84 QT Interval:  390 QTC Calculation: 414 R Axis:   10 Text Interpretation: Normal sinus rhythm Normal ECG Confirmed by Lennice Sites ([id number]) on 11/08/2021 3:18:02 PM  Radiology CT ANGIO HEAD NECK W WO CM  Result Date: 11/08/2021 CLINICAL DATA:  Dizziness EXAM: CT ANGIOGRAPHY HEAD AND NECK TECHNIQUE: Multidetector CT imaging of the head and neck was performed using the standard protocol during bolus administration  of intravenous contrast. Multiplanar CT image reconstructions and MIPs were obtained to evaluate the vascular anatomy. Carotid stenosis measurements (when applicable) are obtained utilizing NASCET criteria, using the distal internal  carotid diameter as the denominator. CONTRAST:  71mL OMNIPAQUE IOHEXOL 350 MG/ML SOLN COMPARISON:  CT head 05/23/2020 FINDINGS: CT HEAD FINDINGS Brain: There is no evidence of acute intracranial hemorrhage, extra-axial fluid collection, or acute infarct. There is mild parenchymal volume loss. The ventricles are not enlarged. There is mild chronic white matter microangiopathy. There is no mass lesion. There is no midline shift. Vascular: See below. Skull: Normal. Negative for fracture or focal lesion. Sinuses and orbits: The imaged paranasal sinuses are clear. The imaged mastoid air cells are clear. Bilateral lens implants are in place. The globes and orbits are otherwise unremarkable. Review of the MIP images confirms the above findings CTA NECK FINDINGS Aortic arch: Standard branching. Imaged portion shows no evidence of aneurysm or dissection. No significant stenosis of the major arch vessel origins. Right carotid system: There is mild calcified atherosclerotic plaque at the right carotid bulb without hemodynamically significant stenosis, occlusion, dissection, or aneurysm. Left carotid system: There is mild calcified atherosclerotic plaque in the left carotid bulb without hemodynamically significant stenosis, occlusion, dissection, or aneurysm. Vertebral arteries: The left vertebral artery is dominant with a diminutive right vertebral artery, a normal variant. The vertebral arteries are patent, without hemodynamically significant stenosis, occlusion, dissection, or aneurysm. Skeleton: There is grade 1 anterolisthesis of C3 on C4. There is mild degenerative change at C5-C6 and C6-C7. There is no visible canal hematoma. There is no acute osseous abnormality or aggressive osseous lesion. Other neck: The soft tissues are unremarkable. Upper chest: The imaged lung apices are clear. Review of the MIP images confirms the above findings CTA HEAD FINDINGS Anterior circulation: The intracranial ICAs are patent with minimal  calcified plaque on the right. There is no hemodynamically significant stenosis or occlusion. The bilateral MCAs are patent. The bilateral ACAs are patent. There is no aneurysm. Posterior circulation: The right V4 segment is diminutive, likely a normal variant. There is minimal calcified atherosclerotic plaque in the prominent left V4 segment without hemodynamically significant stenosis or occlusion. The basilar artery is patent. There is a fetal origin of the right PCA with a hypoplastic right P1 segment. The bilateral PCAs are patent. There is no aneurysm. Venous sinuses: Patent. Anatomic variants: As above. Review of the MIP images confirms the above findings IMPRESSION: 1. No acute intracranial pathology or large vessel occlusion. 2. Mild calcified atherosclerotic plaque in the bilateral carotid bulbs without hemodynamically significant stenosis or occlusion. Otherwise, patent vasculature in the head and neck. Electronically Signed   By: Valetta Mole M.D.   On: 11/08/2021 15:18   MR BRAIN WO CONTRAST  Result Date: 11/08/2021 CLINICAL DATA:  Stroke, follow up EXAM: MRI HEAD WITHOUT CONTRAST TECHNIQUE: Multiplanar, multiecho pulse sequences of the brain and surrounding structures were obtained without intravenous contrast. COMPARISON:  CTA from the same day. FINDINGS: Brain: No acute infarction, hemorrhage, hydrocephalus, extra-axial collection or mass lesion. Generalized atrophy. Vascular: Evaluated on same day CTA. Skull and upper cervical spine: Normal marrow signal. Sinuses/Orbits: Atretic left maxillary sinus. Very mild paranasal sinus mucosal thickening. Left enophthalmos, chronic. Other: No mastoid effusions. IMPRESSION: No evidence of acute intracranial abnormality.  No acute infarct. Electronically Signed   By: Margaretha Sheffield M.D.   On: 11/08/2021 18:24    Procedures Procedures   Medications Ordered in ED Medications  iohexol (OMNIPAQUE) 350 MG/ML injection 100  mL (75 mLs Intravenous  Contrast Given 11/08/21 1501)    ED Course  I have reviewed the triage vital signs and the nursing notes.  Pertinent labs & imaging results that were available during my care of the patient were reviewed by me and considered in my medical decision making (see chart for details).    MDM Rules/Calculators/A&P                            70 year old male with history of hypertension, hyperlipidemia, Parkinson's disease, diabetes, presents with concern for waxing and waning slurred speech over the last 6 days. Do not see signs of anemia, significant electrolyte abnormality, infection, by history and exam. Deny new medication prior to this.  Reports it has been constant and waxing and waning, and timing/duration of worsening per wife history is not consistent with TIA.  Concern for possible CVA. CTA ordered with official reading pending.  MRI also pending at time of transfer of care.  Feel if negative he many continue evaluation with his Neurologist.  Final Clinical Impression(s) / ED Diagnoses Final diagnoses:  Slurred speech    Rx / DC Orders ED Discharge Orders     None        Gareth Morgan, MD 11/08/21 2246

## 2021-11-08 NOTE — ED Provider Notes (Signed)
Patient here with slurred speech at times here in the last several days.  CTA of head and neck unremarkable.  MRI negative for stroke.  Overall my suspicion is some of these issues are secondary to his Parkinson's.  He appears well.  He has no stroke symptoms on exam.  We will have him follow-up with his neurologist and discharged in ED in good condition.  This chart was dictated using voice recognition software.  Despite best efforts to proofread,  errors can occur which can change the documentation meaning.    Lennice Sites, DO 11/08/21 1906

## 2021-11-08 NOTE — ED Triage Notes (Signed)
Pt c/o of intermittent aphasia for 12 days went to PCP [facility] and was not having any aphasia PCP told them to come here if it happened again and today a couple hours ago he started it started again currently says it has 90% improved. Pt has hx parkinson

## 2021-11-09 LAB — URINE CULTURE: Culture: <10000 — AB

## 2021-11-30 ENCOUNTER — Ambulatory Visit: Payer: Medicare Other | Admitting: Neurology

## 2022-01-04 ENCOUNTER — Telehealth: Payer: Self-pay | Admitting: Neurology

## 2022-01-04 NOTE — Telephone Encounter (Signed)
Please call wife back.  Patient is scheduled to see Jinny Blossom in March.  Please check if he needs a sooner than scheduled appointment, can see nurse practitioner. If he has an acute/sudden onset of weakness or slurring of speech, he needs to go to the emergency room immediately.

## 2022-01-04 NOTE — Telephone Encounter (Signed)
Pt's wife, Danny [name] (on [place]) pt needs reevaluation. Parkinson's symptoms are worsening; weakness, cannot seem to stay awake, cannot stand. Would like a call back  Advised Ms. Glendinning to take pt to the ER if symptoms worsens.

## 2022-01-04 NOTE — Telephone Encounter (Signed)
I spoke with the patient's wife.  She states for the last 1 to 2 months the patient has been changing and she questions if he is developing LBD.  She states this morning he had difficulty walking but then recently he has been walking.  She states his speech was terrible he did have a low blood pressure this morning but they got it back up.  He fell yesterday on his bottom, got a bruise, but did not get injured otherwise nor did he hit his head.  She states sometimes he just does not seem to be figuring out what he needs to do.  He also had COVID around Christmas.  She does want him seen sooner.  I tried to offer an appointment next Monday with the nurse practitioner but she was unavailable and asking for an appointment Tuesday.  I was able to get the patient scheduled with Dr. Rexene Alberts Tuesday, January 24 at 9:15 AM.  She will arrive early with the patient.  I also expressed to her multiple times during the visit that if he worsens or acutely changes, or develops acute weakness, slurred speech, he should go to the ER right away/call 911 for emergent evaluation.  She said he has been sleepy today but he is currently watching TV.  I told her I did not know why he would be sleepier today but if he gets even more sleepy, becomes difficult to arouse etc. she should call 911. she verbalized understanding.  She verbalized appreciation for the call.

## 2022-01-08 NOTE — Telephone Encounter (Signed)
Nothing further needed, you have already stressed the importance of getting him checked out immediately should his condition change.

## 2022-01-09 ENCOUNTER — Ambulatory Visit (INDEPENDENT_AMBULATORY_CARE_PROVIDER_SITE_OTHER): Payer: Medicare Other | Admitting: Neurology

## 2022-01-09 ENCOUNTER — Encounter: Payer: Self-pay | Admitting: Neurology

## 2022-01-09 VITALS — BP 122/80 | HR 83 | Ht 69.0 in | Wt 179.0 lb

## 2022-01-09 DIAGNOSIS — R413 Other amnesia: Secondary | ICD-10-CM | POA: Diagnosis not present

## 2022-01-09 DIAGNOSIS — R296 Repeated falls: Secondary | ICD-10-CM

## 2022-01-09 DIAGNOSIS — Z9181 History of falling: Secondary | ICD-10-CM

## 2022-01-09 DIAGNOSIS — R441 Visual hallucinations: Secondary | ICD-10-CM

## 2022-01-09 DIAGNOSIS — Z7409 Other reduced mobility: Secondary | ICD-10-CM

## 2022-01-09 DIAGNOSIS — G20 Parkinson's disease: Secondary | ICD-10-CM

## 2022-01-09 NOTE — Patient Instructions (Signed)
It was nice to see you both again today.  I am sorry to hear that you are having ongoing trouble with mobility, hallucinations and falls.  Please look into the possibility of hiring some help with that can come in the mornings to help with showering and dressing.  I can make a referral to home health for therapy.  I am not quite sure when you changed the carbidopa-levodopa to half pill for the first 2 doses in the mornings, we have not made this change from our end.  I would like for you to taper off pramipexole by reducing it to 1 pill twice daily, 8 AM and 3:30 PM for the next 3 days, then once daily at 8 AM for 3 days and then stop.  Admits that you are benefiting from the pramipexole, it can add to sleepiness and hallucinations.  You can continue with the carbidopa/levodopa 1 pill 6 times a day 89, 10:30 AM, 1 PM, 3:30 PM, 6 PM and 8:30 PM daily and Sinemet CR at bedtime.

## 2022-01-09 NOTE — Progress Notes (Signed)
Subjective:    Patient ID: Danny [name] is a 71 y.o. male.  HPI    Interim history:   [name] is a very pleasant 71 year old right-handed gentleman with an underlying medical history of type 2 diabetes, osteoarthritis, status post right knee replacement in March 2021, hypertension, obesity, hyperlipidemia, and cataracts, who presents for follow-up consultation of his Parkinson's disease, complicated by decline in mobility, knee pain on the R, history of IBD, memory loss, hallucinations, and recurrent falls.  The patient is accompanied by his wife again today and presents for a sooner than scheduled appointment.  I last saw him on 11/01/2021, at which time I wrote for a manual wheelchair.  I continued his Florinef and his other medications.  He was having daily consistent hallucinations per wife.  We had tried Nuplazid before.  A trial of Nourianz for off time resulted in worsening hallucinations.  His blood pressure was typically lower in the first part of the day and he was on half a pill of Florinef.   Patient's wife called in November 2022 reporting that patient had a decline in his health and that his speech had changed.  He was seen by his primary care nurse PA.  He presented to the emergency room on 11/08/2021 for slurring of speech lasting several days.  I reviewed the emergency room records.  He had a CT angiogram of the head and neck with and without contrast on 11/08/2021 and I reviewed the results:   IMPRESSION: 1. No acute intracranial pathology or large vessel occlusion. 2. Mild calcified atherosclerotic plaque in the bilateral carotid bulbs without hemodynamically significant stenosis or occlusion. Otherwise, patent vasculature in the head and neck.   He had a brain MRI without contrast on 11/08/2021 and I reviewed the results: IMPRESSION: No evidence of acute intracranial abnormality.  No acute infarct.  Laboratory studies showed a benign urinalysis, CBC with  differential benign, CMP showed BUN of 15, creatinine 1.15, normal sodium and potassium levels, normal liver enzymes, total bilirubin slightly elevated at 1.5.  Patient's wife called in January 2023 reporting that he has had a steady decline over the past 1 or 2 months.  She reported that he had COVID around Christmas 2022.  She reported a recent fall.   Today, 01/09/2022: He reports that he has had steady decline in his mobility and posture, he has had falls, he has had significantly low blood pressure values especially in the mornings, she gives him the half pill of Florinef depending on how low his blood pressure is.  He has been having constant hallucinations. She feels exhausted.  She has not had any help at the house.  She helps with showering and dressing.  She puts his compression socks on.  She tries to encourage hydration with propel water.  She has been giving him Sinemet half a pill at 8:30 AM and 10:30 AM and 1 pill at 1 PM, 3:30 PM, 6 PM and 8:30 PM daily.  She is not sure when she started with 1/2 pill for the first 2 doses.  We did not make this change in November.  He has had fainting spells.  He has fallen recently without major injuries but she had to take him to the ER a few times.  The patient's allergies, current medications, family history, past medical history, past social history, past surgical history and problem list were reviewed and updated as appropriate.      Previously (copied from previous notes for reference):  I saw him on 01/11/2021, at which time we mutually agreed to continue his medication regimen.  He was on low-dose Seroquel per primary care.  He was on Florinef for low blood pressure values.   He saw Vaughan Browner, NP in the interim on 05/25/2021, at which time his wife reported frequent falls.  He was kept on the same medication regimen.       I saw him on 10/11/20, at which time he reported lack of stamina and feeling overall weaker, having more freezing  and more off time.  He did not increase his Sinemet to 1 pill 6 times a day as discussed during the visit prior.  He had lost quite a bit of weight.  He had some low blood pressure values.  He was advised to increase his Sinemet only mutually agreed to try him on Nourianz.   His wife called about a month later reporting that he had more hallucinations on the [place].  Unfortunately, they were not able to afford the Nuplazid.     I saw him on 07/06/2020, at which time he had more difficulty relating his own history and he had more decline in his motor function.  He had fallen multiple times.  He had knee replacement surgery.  They could not afford the prescription for Nuplazid long-term.  He was advised to increase his Sinemet to 1 pill 6 times a day and continue with the Sinemet CR at bedtime.  He had stable hallucinations.  He continues to take low-dose Mirapex 0.5 mg 3 times daily.    I saw him on 02/11/2020, at which time we talked about his medication regimen, recent fall, and he needed clearance for his right knee replacement.   He had an appointment in the interim with Debbora Presto, nurse practitioner on 05/23/2020, at which time Sinemet CR was added at bedtime and he was advised to start Nuplazid.   However, his wife reported back that Nuplazid was not started secondary to cost.         I saw him on 10/05/2019, at which time he reported feeling tired during the day.  His wife endorsed that he was quite sleepy during the day, particularly in the early evenings.  He has had some falls.  He had bruised his sternum and ribs.  He did fall in his kitchen and hit his head against a curtain holder and had a small area of skin injury in the right forehead.  He was advised to continue with Sinemet 1 pill 5 times a day but advised to further reduce his Mirapex to 0.5 mg 3 times daily.  I asked him to have a head CT done.  He had a head CT without contrast on 10/08/2020 and I reviewed the results: IMPRESSION:     Normal CT head (without).  We called him with his test results.     I saw him on 06/04/2019, at which time he reported difficulty with his mobility.  He had fallen, thankfully without major injuries.  His wife had noticed more forgetfulness and some confusion at times.  He was more sleepy during the day.  He was taking Sinemet 4 times a day and had more difficulty in the mornings.  He was on Mirapex 3 times a day.  He had intermittent constipation.  He reported fleeting hallucinations, nothing sustained or anxiety provoking.  He was advised to increase his Sinemet to 1 pill 5 times a day at 3 hourly intervals starting at 8  AM.  He was advised to continue with Mirapex 3 times daily.  He was advised to be very proactive about constipation issues.    I saw him on 12/03/2018, at which time he was advised to continue with Sinemet 1 pill 4 times a day.  He did have her fall incident after he missed the chair when sitting down.  His memory was stable for the most part.  He was advised to hydrate well and be proactive about constipation issues, we talked about fall prevention.     I saw him on 06/05/2018, at which time he felt fairly stable but his wife had noticed more shuffling and also some mood irritability. He had no falls thankfully and some forgetfulness was noted. He was more sleepy during the day. Suggested he continue with generic Mirapex 0.75 mg 3 times a day but increase his Sinemet from 3 times a day to 1 pill 4 times a day.   His wife called in the interim in October 2019 requesting a letter to support their cancellation of their timeshare as he was not able to drive and they were not able to utilize their timeshare.    I saw him on 12/05/2017, at which time he felt fairly stable on Sinemet, we had reduced his Mirapex to 0.75 mg 3 times a day. He is trying to stay active. He had no recent falls. I suggested we continue with his Sinemet and Mirapex at the current doses.   I saw him on  09/09/2017 at which time he had been more sleepy during the day. His wife was worried that he was more preoccupied with sex. He had ongoing issues with intermittent double vision. He had intermittent and mild dream enactments. Suggested we gradually start him on low-dose Sinemet starting with half pill and gradually increasing this to 1 pill 3 times a day. His wife called in the interim in October reporting that he had done fairly well with Sinemet, at which time we reduced his Mirapex from 1 mg 3 times a day to 0.75 mg 3 times a day.      I saw him on 03/04/2017, at which time he felt stable. He retired in December 2017. The daughter was living with them. Mother-in-law had moved in as well. His wife retired also. He was going to the gym on a regular basis. Memory-wise, he had mild forgetfulness, no mood issues, no sleep issues, no side effects from the Mirapex. He had no recent falls thankfully. He had some mild dream enactments but no major issues. Looking back, his symptoms of peak date back to 2014, he had a sleep study in early 2014 with PLMS noted but no OSA and he denies restless leg symptoms. We mutually agreed to taper him off of Azilect as it was too expensive. I suggested in lieu of the Azilect, we increase his pramipexole to 1 mg 3 times a day.     I saw him on 09/03/2016, at which time he reported doing well, no telltale differences after increasing the Mirapex. He gave up driving. He decided to retire by the end of December 2017. His mother-in-law was supposed to move in with them. His memory was stable, sleep was stable, no real mood issues, no recent falls. I suggested we keep his medications the same including Azilect 1 mg once daily, Mirapex 0.75 mg 3 times a day.   I saw him on 02/27/2016, at which time he reported more difficulty with his walking, he had  become slower. Wife had noted that it would take him longer to do things. He was dragging his feet at times. He was not swinging his  arms as well. He was working full-time as a Land, avoiding stairs and heights. His A1c in December 2016 was a little up at 7.6. Memory was stable, with some mild forgetfulness. Mood was stable, RBD was infrequent. He reported no recent falls. I suggested he continue with Azilect and we increased his Mirapex to 0.75 mg 3 times a day.    I saw him on 08/29/2015 at which time he reported doing fairly well. He was able to tolerate Azilect. His Mirapex was 0.5 mg 3 times a day. He was working at the computer most of the time. He was not always drinking enough water. He had a good appetite. He felt his memory was stable. RBD was less frequent but still occurring. His wife was able to sleep in the same bed with him. He had no recent falls. He had some recent blurry vision was going to see his ophthalmologist. We mutually agreed to continue his current medication regimen.   I saw him on 04/25/2015, at which time he reported doing fairly well overall, but had noted smaller changes, including more slowness, difficulty with climbing ladders. He was advised by his boss to not climb ladders or walk over obstacles at work. Thankfully, his work environment is understanding. He had another business trip coming up and a colleague was to drive and do the climbing on the sites. He reported mild forgetfulness and some problems with depth perception. His diplopia had improved since he was given prism glasses by his ophthalmologist. I suggested, we try him on Azilect and keep his Mirapex the same.    I saw him on 02/04/2015, at which time he reported ongoing issues with diplopia, particularly at night while driving. He was still working full-time. He was still on low-dose Mirapex, 0.25 mg 3 times a day. He had no side effects. He had had a sleep study which per his report did not show any OSA. I asked him to make an appointment with his eye doctor. I suggested an increase in Mirapex to 0.5 mg 3 times a day.   I  first met him on 12/28/2014, at which time he reported that he was having more fatigue. However, he also had reduce his caffeine intake. He had no recent falls and mood stable. He was working full-time. He reported that he had intermittent double vision in the last 3 months. His PCP requested a sooner than scheduled appointment for diplopia. His exam was stable at the time and I suggested no new medication changes.   He has had some short-term memory issues. He works full-time. He is an Chief Financial Officer. He likes square dance but his wife has noted that he has had more problems with his nighttime driving and his squared hands. In the recent 3 months he has had some intermittent double vision at night. He has drooling at night. He rarely drinks alcohol and usually drinks 4-5 cups of coffee per day, occasional sodas. He quit smoking in 1978. Blood pressure is low for him today. It was recently noted to be trending lower and his blood pressure medication was reduced. He takes his cholesterol medication every other day.   He had a brain MRI without contrast on 11/04/2013: Normal MRI scan of the brain. Incidental findings of a chronic paranasal sinusitis with deviated nasal septum to the left.  He  previously followed with Dr. Jim Like and was last seen by him on 08/30/2014, at which time his Mirapex was kept at 0.25 mg 3 times a day. He reported no side effects, in particular no impulse control disorder. I reviewed Dr. Hazle Quant notes. He first met Dr. Janann Colonel on 10/26/13, at which time the patient reported a right hand tremor noticed over the previous 4-6 months. He had changes in his handwriting, slowness, some stiffness and changes in his walking.  His wife reported a longer standing history of REM behavior disorder. He has been in outpatient physical therapy.  His Past Medical History Is Significant For: Past Medical History:  Diagnosis Date   Allergy    SEASONAL   Cataract    BILATERAL-REMOVED   Essential  hypertension, benign    PT.DENIES ON AS PREVENTIVE 08/17/19   GERD (gastroesophageal reflux disease)    Hx of adenomatous polyp of colon 09/04/2019   08/2019 diminutive adenoma No recall given findings and age   Mixed hyperlipidemia    PT.DENIES,STATED ON AS PREVENTIVE 07/30/19   Neuromuscular disorder (HCC)    PARKINSONS   OA (osteoarthritis) of knee    right   Obesity, unspecified    Osteoarthritis    KNEE AND SHOULDERS   Parkinsons ([place])    Type II or unspecified type diabetes mellitus without mention of complication, not stated as uncontrolled     His Past Surgical History Is Significant For: Past Surgical History:  Procedure Laterality Date   CATARACT EXTRACTION, BILATERAL     GANGLION CYST EXCISION  age 68 years   right   KNEE ARTHROSCOPY     right   KNEE ARTHROSCOPY     TOTAL KNEE ARTHROPLASTY Right 03/08/2020   Procedure: RIGHT TOTAL KNEE ARTHROPLASTY;  Surgeon: Garald Balding, MD;  Location: [facility];  Service: Orthopedics;  Laterality: Right;   WRIST FRACTURE SURGERY     WRIST SURGERY  04/2012    His Family History Is Significant For: Family History  Problem Relation Age of Onset   Diabetes Mother    Lung cancer Mother    Cancer Father    Heart disease Father    Diabetes Sister    Diabetes Daughter    Colon polyps Son    Colon cancer Neg Hx    Esophageal cancer Neg Hx    Rectal cancer Neg Hx    Stomach cancer Neg Hx    Parkinson's disease Neg Hx     His Social History Is Significant For: Social History   Socioeconomic History   Marital status: Married    Spouse name: [name]   Number of children: 3   Years of education: 16   Highest education level: Bachelor's degree (e.g., BA, AB, BS)  Occupational History   Occupation: Chief Financial Officer  Tobacco Use   Smoking status: Former    Packs/day: 1.50    Years: 15.00    Pack years: 22.50    Types: Cigarettes    Quit date: 12/17/1982    Years since quitting: 39.0   Smokeless tobacco: Never   Tobacco comments:     over 40 yrs  Vaping Use   Vaping Use: Never used  Substance and Sexual Activity   Alcohol use: Yes    Comment: very rare   Drug use: No   Sexual activity: Yes    Partners: Female  Other Topics Concern   Not on file  Social History Narrative   Research officer, trade union, motorcycle rider.  He lives with his wife (  [name]).  He has 3 adult children.  His wife had 2 children, one of whom is deceased.   Patient is working full-time.   Patient has a Bachelor's degree.   Patient is right-handed.   Patient drinks 6 cups of coffee daily.   Social Determinants of Health   Financial Resource Strain: Not on file  Food Insecurity: Not on file  Transportation Needs: Not on file  Physical Activity: Not on file  Stress: Not on file  Social Connections: Not on file    His Allergies Are:  Allergies  Allergen Reactions   Codeine Nausea And Vomiting  :   His Current Medications Are:  Outpatient Encounter Medications as of 01/09/2022  Medication Sig   Ascorbic Acid (VITAMIN C ADULT GUMMIES PO) Take 2 tablets by mouth daily.    aspirin 81 MG EC tablet Take 81 mg by mouth daily. Swallow whole.   carbidopa-levodopa (SINEMET CR) 50-200 MG tablet Take 1 tablet by mouth at bedtime.   carbidopa-levodopa (SINEMET IR) 25-100 MG tablet Take 1 tablet by mouth 6 (six) times daily.   Cinnamon 500 MG capsule Take 500 mg by mouth daily.   famotidine (PEPCID) 20 MG tablet Take 20 mg by mouth in the morning and at bedtime.    fludrocortisone (FLORINEF) 0.1 MG tablet Take 0.5 tablets (0.05 mg total) by mouth daily.   Ginger, Zingiber officinalis, 550 MG CAPS Take 550 mg by mouth daily.   glipiZIDE (GLUCOTROL XL) 10 MG 24 hr tablet TAKE 1 TABLET BY MOUTH DAILY (Patient taking differently: Take 10 mg by mouth at bedtime.)   Glucosamine-Chondroit-Vit C-Mn (GLUCOSAMINE CHONDR 1500 COMPLX PO) Take 1,500 mg by mouth daily.   ketoconazole (NIZORAL) 2 % cream Apply 1 application topically 2 (two) times daily.    metFORMIN  (GLUCOPHAGE) 1000 MG tablet Take 1 tablet (1,000 mg total) by mouth 2 (two) times daily with a meal.   Multiple Vitamin (MULTIVITAMIN) tablet Take 1 tablet by mouth daily.   pramipexole (MIRAPEX) 0.5 MG tablet Take 1 tablet (0.5 mg total) by mouth 3 (three) times daily.   QUEtiapine (SEROQUEL) 50 MG tablet Take by mouth.   No facility-administered encounter medications on file as of 01/09/2022.  :  Review of Systems:  Out of a complete 14 point review of systems, all are reviewed and negative with the exception of these symptoms as listed below:   Review of Systems  Neurological:        Pt is here for follow up with parkinson disease. Wife has questions about his medication dosage  for his Carbidopa-levodopa . Wife is concerned about is progression . Wife states the patient leans a lot and he is not very active  Wife wants to talk about better quality of life for the patient. Wife states pt passed out several time in dec 2022. Pt  fell Jan 2023    Objective:  Neurological Exam  Physical Exam Physical Examination:   Vitals:   01/09/22 0941  BP: 122/80  Pulse: 83    General Examination: The patient is a very pleasant 71 y.o. male in no acute distress. He appears well-developed and well-nourished and well groomed.   HEENT: Normocephalic, atraumatic, pupils are equal, round and reactive to light, extraocular tracking is.  Difficult for him, significant forward flexion of his neck is noted and he is leaning forward and to the right in the wheelchair.  He wears prism eyeglasses.  Hearing is grossly intact.  Speech is hypophonic and mildly dysarthric,  scant.  Face is moderately masked, neck is moderately rigid. Airway examination is limited, mild mouth dryness.      Chest: Clear to auscultation without wheezing, rhonchi or crackles noted.   Heart: S1+S2+0, regular and normal without murmurs, rubs or gallops noted.    Abdomen: Soft, non-tender and non-distended.   Extremities: There is  no pitting edema in the distal lower extremities bilaterally.  Wearing compression socks up to the knees bilaterally.   Skin: Warm and dry without trophic changes noted.   Musculoskeletal: exam reveals no new changes.     Neurologically:  Mental status: The patient is awake, alert and oriented in all 4 spheres. His immediate and remote memory, attention, language skills and fund of knowledge are impaired. His history is primarily provided by his wife. Speech as above. Thought process is linear. Mood is normal and affect is normal otherwise.    On 06/04/2019: MMSE: 25/30, CDT: 2/4, AFT: 14/min.   Cranial nerves II - XII are as described above under HEENT exam.  Motor exam: Normal appearing bulk, global strength of 4 out of 5, increased tone particularly on the right side.  Moderate to severe bradykinesia.  No obvious resting tremor, no obvious dyskinesias.  No active hallucinations currently. Fine motor skills are severely impaired on the right and moderate to severely impaired on the left.   Sensory exam is intact to light touch.  Cerebellar testing shows no dysmetria or intention tremor. Gait, station and balance: He is in a wheelchair. I did not have him stand or walk for me today.       Assessment and plan:    In summary, RALPHEAL ZAPPONE is a very pleasant 71 year old male with an underlying medical history of type 2 diabetes, osteoarthritis, hypertension, obesity, hyperlipidemia, cataracts with status post surgeries, status post right knee replacement surgery, Hx of falls, who presents for follow-up consultation of his right-sided predominant Parkinson's disease, complicated by hallucinations, REM behavior disorder, daytime somnolence, memory loss, recurrent falls, freezing, hypotension, and constipation and dyskinesias. His motor symptoms date back to around 04/2013 and RBD symptoms predated his motor symptoms by perhaps 12-15 years even. He had done fairly well over the past years but he  has had progressive decline in his motor function and balance as well as some dyskinesias and he has had fairly consistent hallucinations in the past about 3 years. We talked about the challenges of advancing Parkinson's disease at length before and again today.  We had resumed therapy.  He has had memory loss.  She is encouraged to look into hiring help in the mornings especially.  She is also offered a home health referral but she declines today.  She has been giving him a half a pill of levodopa for the first 2 doses in the mornings, unsure when she started this.  We did not make this change in November 2022. He is on Mirapex 0.5 mg 3 times daily, Not convinced that he is currently benefiting from it, it may add to daytime somnolence and sleepiness.  She is therefore advised to taper this off over the next several days while reducing it to 1 pill twice daily for the next 3 days, then 1 pill once daily for the next 3 days after and then stop altogether.  He will maintain Sinemet at 1 pill 6 times a day, at 8 AM, 10:30 AM, 1 PM, 3:30 PM, 6 PM and 8:30 PM.  (They did not actually implement this change in July  2021 or in 10/21). He has been on Seroquel qHS, which is now at 50 mg. He is on low dose Florinef 0.1 mg, 1/2 in AM daily.  Unfortunately, he was not able to get Nuplazid due to cost.  In October 2021 we tried Nourianz 20 mg once daily for increase in off time but his wife reported that he was having more hallucinations on it so we decided to stop it. He has had some difficulty she wean to light use very slowly per wife's report but no recent choking episodes.  They are advised to continue to stay proactive about constipation issues. He is advised to follow-up in 3 months to see one of our nurse practitioners, sooner if needed.  I put in a prescription for a manual wheelchair. I answered all their questions today and they were in agreement. I spent 40 minutes in total face-to-face time and in reviewing  records during pre-charting, more than 50% of which was spent in counseling and coordination of care, reviewing test results, reviewing medications and treatment regimen and/or in discussing or reviewing the diagnosis of PD, the prognosis and treatment options. Pertinent laboratory and imaging test results that were available during this visit with the patient were reviewed by me and considered in my medical decision making (see chart for details).

## 2022-01-11 ENCOUNTER — Ambulatory Visit: Payer: Medicare Other | Admitting: Neurology

## 2022-02-09 ENCOUNTER — Telehealth: Payer: Self-pay | Admitting: Neurology

## 2022-02-09 NOTE — Telephone Encounter (Signed)
Pt's wife called stating that the pt's PT advised that he should get assessed for Palliative care. Pt's wife would like to be called if and when this is approved by the insurance.

## 2022-02-12 NOTE — Telephone Encounter (Signed)
I would recommend that palliative care referral come from PCP. Please advise pt's wife, thank you.

## 2022-02-12 NOTE — Telephone Encounter (Signed)
Spoke to wife (checked DPR) Informed wife she would have to call PCP for palliative care referral . Per  Dr Rexene Alberts recommendation., Wife states she will  call PCP today and thanked me for calling her back.

## 2022-02-12 NOTE — Telephone Encounter (Signed)
Called wife back (DPR CHECKED) tried home and cell . VM was full and no answer on cell . Will try again later

## 2022-02-20 ENCOUNTER — Ambulatory Visit (INDEPENDENT_AMBULATORY_CARE_PROVIDER_SITE_OTHER): Payer: Medicare Other | Admitting: Physician Assistant

## 2022-02-20 ENCOUNTER — Ambulatory Visit (INDEPENDENT_AMBULATORY_CARE_PROVIDER_SITE_OTHER): Payer: Medicare Other

## 2022-02-20 ENCOUNTER — Other Ambulatory Visit: Payer: Self-pay

## 2022-02-20 ENCOUNTER — Ambulatory Visit: Payer: Self-pay

## 2022-02-20 DIAGNOSIS — Z96651 Presence of right artificial knee joint: Secondary | ICD-10-CM | POA: Diagnosis not present

## 2022-02-20 DIAGNOSIS — M25531 Pain in right wrist: Secondary | ICD-10-CM

## 2022-02-20 DIAGNOSIS — M545 Low back pain, unspecified: Secondary | ICD-10-CM

## 2022-02-20 MED ORDER — MELOXICAM 7.5 MG PO TABS: 7.5000 mg | ORAL_TABLET | Freq: Every day | ORAL | 0 refills | Status: DC

## 2022-02-20 NOTE — Progress Notes (Signed)
? ?Office Visit Note ?  ?Patient: Danny [name]           ?Date of Birth: August 07, 1951           ?MRN: [id number] ?Visit Date: 02/20/2022 ?             ?Requested by: [name], PA ?[address] ?[place],  [place] [address] ?PCP: [name], PA ? ?Chief Complaint  ?Patient presents with  ? Right Knee - Pain  ? Lower Back - Pain  ?Right wrist pain ? ? ? ?HPI: ?Patient is a pleasant 71 year old gentleman with a history of advancing Parkinson's disease.  He is accompanied by his wife.  He is almost 2 years status post right knee replacement with Dr. Durward Fortes.  He has had difficulties with pain in the knee.  He did have an aspirate done 5 months after the knee replacement which was negative for any infection.  MRI did not show anything significant at that time as well..  He has just had increasing pain and from time to time to get swollen.  His wife asked that he be seen today as he was bending over to pull up his pants in the bathroom and felt a pop in his backover the weekend He has focal pain over the lower posterior back and buttock.  No radiation to the lower extremities no loss of bowel or bladder control no paresthesias or numbness.  His wife also mentions as does he that he has difficulty and pain in his right wrist with intermittent swelling.  He reports a history of previous fracture.  He is right-handed and uses his hand to push himself up from his wheelchair ? ?Assessment & Plan: ?Visit Diagnoses:  ?1. Acute bilateral low back pain without sciatica   ?2. Total knee replacement status, right   ?3. Pain in right wrist   ? ? ?Plan: Right knee pain: X-rays look similar to those of a year and a half ago but I will review them with Dr. Durward Fortes.  He is doing gentle physical therapy at home.  Do not see anything acute.  Does not have any sign or evidence of infection and no effusion today ?Lower back pain.  Do not see any acute abnormalities.  Hesitant to prescribe muscle relaxants given  his Parkinson's disease and his difficulty with balance but have prescribed a low-dose of Mobic discussed alternating heat and ice we will follow-up again in a week ?Right wrist pain x-rays consistent with right wrist arthritis.  No swelling today no signs of infection.  Discussed that he may benefit from an injection when he sees Dr. Durward Fortes next week.  Also feel the Mobic will help this as well ? ?Follow-Up Instructions: No follow-ups on file.  ? ?Ortho Exam ? ?Patient is alert, oriented, no adenopathy, well-dressed, normal affect, normal respiratory effort. ?Right knee: Well-healed surgical scar.  I cannot appreciate an effusion or any significant warmth today.  Range of motion is fairly good.  No ascending cellulitis.  Compartments are soft and nontender.  He does have quite a bit of muscle atrophy. ?Lower back he has focal pain over the lower right back into the buttock.  No radiation or paresthesias.  Strength is at his baseline given his Parkinson's disease. ?Right wrist no redness no swelling today.  He does have pain when pushing up from his wheelchair in his wrist.  ? ?Imaging: ?XR KNEE 3 VIEW RIGHT ? ?Result Date: 02/20/2022 ?Radiographs of the right  knee demonstrate findings consistent status post right knee total arthroplasty.  No significant changes from x-rays taken to you a year ago.  No evidence of lucency or loosening.  No acute fracture ? ?XR Wrist 2 Views Right ? ?Result Date: 02/20/2022 ?Radiographs of the right wrist were reviewed today.  No acute fracture.  He does have some joint space narrowing osteophyte and sclerosis of the radiocarpal joint of the radiocarpal joint.  No acute fractures overall well-maintained alignment  ?No images are attached to the encounter. ? ?Labs: ?Lab Results  ?Component Value Date  ? HGBA1C 6.4 (H) 03/08/2020  ? HGBA1C 6.9 (H) 04/16/2018  ? HGBA1C 7.0 (H) 01/17/2018  ? REPTSTATUS 11/09/2021 FINAL 11/08/2021  ? CULT (A) 11/08/2021  ?  <10,000 COLONIES/mL  INSIGNIFICANT GROWTH ?Performed at [facility], [place] [address] [place], [place] [address] ?  ? LABORGA NO GROWTH 10/12/2015  ? ? ? ?Lab Results  ?Component Value Date  ? ALBUMIN 3.6 11/08/2021  ? ALBUMIN 3.9 03/01/2020  ? ALBUMIN 4.3 04/16/2018  ? ? ?No results found for: MG ?No results found for: VD25OH ? ?No results found for: PREALBUMIN ?CBC EXTENDED Latest Ref Rng & Units 11/08/2021 03/11/2020 03/10/2020  ?WBC 4.0 - 10.5 K/uL 8.0 12.9(H) 12.5(H)  ?RBC 4.22 - 5.81 MIL/uL 4.57 4.03(L) 4.30  ?HGB 13.0 - 17.0 g/dL 14.3 12.5(L) 13.4  ?HCT 39.0 - 52.0 % 43.5 39.1 41.8  ?PLT 150 - 400 K/uL 176 168 171  ?NEUTROABS 1.7 - 7.7 K/uL 4.8 - -  ?LYMPHSABS 0.7 - 4.0 K/uL 2.1 - -  ? ? ? ?There is no height or weight on file to calculate BMI. ? ?Orders:  ?Orders Placed This Encounter  ?Procedures  ? XR KNEE 3 VIEW RIGHT  ? XR Lumbar Spine 2-3 Views  ? XR Wrist 2 Views Right  ? ?Meds ordered this encounter  ?Medications  ? meloxicam (MOBIC) 7.5 MG tablet  ?  Sig: Take 1 tablet (7.5 mg total) by mouth daily.  ?  Dispense:  20 tablet  ?  Refill:  0  ? ? ? Procedures: ?No procedures performed ? ?Clinical Data: ?No additional findings. ? ?ROS: ? ?All other systems negative, except as noted in the HPI. ?Review of Systems ? ?Objective: ?Vital Signs: There were no vitals taken for this visit. ? ?Specialty Comments:  ?No specialty comments available. ? ?PMFS History: ?Patient Active Problem List  ? Diagnosis Date Noted  ? Hemarthrosis of right knee 06/30/2020  ? Heterotopic calcification, postoperative 06/30/2020  ? Total knee replacement status, right 03/22/2020  ? Hematospermia 11/18/2015  ? Epididymal cyst 05/25/2015  ? Parkinson disease ([place]) 01/14/2014  ? Obesity (BMI 30-39.9) 07/14/2013  ? Confusional arousals 02/13/2013  ? Disorder of ligament of right wrist 01/29/2012  ? Diabetes mellitus type 2, uncomplicated ([place]) 38/46/6599  ? Mixed hyperlipidemia 10/09/2011  ? Unilateral primary osteoarthritis, right knee 10/09/2011  ?  Essential hypertension, benign 10/09/2011  ? ?Past Medical History:  ?Diagnosis Date  ? Allergy   ? SEASONAL  ? Cataract   ? BILATERAL-REMOVED  ? Essential hypertension, benign   ? PT.DENIES ON AS PREVENTIVE 08/17/19  ? GERD (gastroesophageal reflux disease)   ? Hx of adenomatous polyp of colon 09/04/2019  ? 08/2019 diminutive adenoma No recall given findings and age  ? Mixed hyperlipidemia   ? PT.DENIES,STATED ON AS PREVENTIVE 07/30/19  ? Neuromuscular disorder ([place])   ? PARKINSONS  ? OA (osteoarthritis) of knee   ? right  ? Obesity,  unspecified   ? Osteoarthritis   ? KNEE AND SHOULDERS  ? Parkinsons ([place])   ? Type II or unspecified type diabetes mellitus without mention of complication, not stated as uncontrolled   ?  ?Family History  ?Problem Relation Age of Onset  ? Diabetes Mother   ? Lung cancer Mother   ? Cancer Father   ? Heart disease Father   ? Diabetes Sister   ? Diabetes Daughter   ? Colon polyps Son   ? Colon cancer Neg Hx   ? Esophageal cancer Neg Hx   ? Rectal cancer Neg Hx   ? Stomach cancer Neg Hx   ? Parkinson's disease Neg Hx   ?  ?Past Surgical History:  ?Procedure Laterality Date  ? CATARACT EXTRACTION, BILATERAL    ? GANGLION CYST EXCISION  age 56 years  ? right  ? KNEE ARTHROSCOPY    ? right  ? KNEE ARTHROSCOPY    ? TOTAL KNEE ARTHROPLASTY Right 03/08/2020  ? Procedure: RIGHT TOTAL KNEE ARTHROPLASTY;  Surgeon: Garald Balding, MD;  Location: [facility];  Service: Orthopedics;  Laterality: Right;  ? WRIST FRACTURE SURGERY    ? WRIST SURGERY  04/2012  ? ?Social History  ? ?Occupational History  ? Occupation: Chief Financial Officer  ?Tobacco Use  ? Smoking status: Former  ?  Packs/day: 1.50  ?  Years: 15.00  ?  Pack years: 22.50  ?  Types: Cigarettes  ?  Quit date: 12/17/1982  ?  Years since quitting: 39.2  ? Smokeless tobacco: Never  ? Tobacco comments:  ?  over 40 yrs  ?Vaping Use  ? Vaping Use: Never used  ?Substance and Sexual Activity  ? Alcohol use: Yes  ?  Comment: very rare  ? Drug use: No  ? Sexual activity:  Yes  ?  Partners: Female  ? ? ? ? ? ?

## 2022-02-28 ENCOUNTER — Other Ambulatory Visit: Payer: Self-pay

## 2022-02-28 ENCOUNTER — Encounter: Payer: Self-pay | Admitting: Orthopaedic Surgery

## 2022-02-28 ENCOUNTER — Ambulatory Visit (INDEPENDENT_AMBULATORY_CARE_PROVIDER_SITE_OTHER): Payer: Medicare Other | Admitting: Orthopaedic Surgery

## 2022-02-28 DIAGNOSIS — M5440 Lumbago with sciatica, unspecified side: Secondary | ICD-10-CM | POA: Diagnosis not present

## 2022-02-28 NOTE — Progress Notes (Signed)
? ?Office Visit Note ?  ?Patient: Danny [name]           ?Date of Birth: 1951/10/04           ?MRN: [id number] ?Visit Date: 02/28/2022 ?             ?Requested by: [name], PA ?[address] ?[place],  [place] [address] ?PCP: [name], PA ? ? ?Assessment & Plan: ?Visit Diagnoses: Low back pain.  Right knee pain right wrist pain ?                            ? ? ?Plan: Patient has had some improvement with his wrist pain and his back pain and knee pain with a low-dose of Mobic.  However he is in a wheelchair quite a bit because of his Parkinson's disease and he is still focally uncomfortable over his sacrum.  He does not seem to have any radicular findings.  Concern for insufficiency fractures in the sacrum.  He also has been doing physical therapy and this has not improved this much.  We will go forward with an MRI he will follow-up afterwards.  In the meantime we will continue on the Mobic.  Continue working with physical therapy.  We will also give him a wrist splint to help with some of his arthritic pain in his wrist with regards to his knee continue with can conservative treatment.  Continue with physical therapy.  No obvious signs of infection or concern.  He does have some ossification in the distal quadriceps which certainly pick giving him some of his symptoms but quadriceps is intact ? ?Follow-Up Instructions: No follow-ups on file.  ? ?Orders:  ?No orders of the defined types were placed in this encounter. ? ?No orders of the defined types were placed in this encounter. ? ? ? ? Procedures: ?No procedures performed ? ? ?Clinical Data: ?No additional findings. ? ? ?Subjective: ?Chief Complaint  ?Patient presents with  ? Right Knee - Follow-up  ? Lower Back - Follow-up  ? Right Wrist - Follow-up  ?Patient presents today for a one week follow up on his lower back, right knee, and right wrist. He saw Latanya Presser last week and is here today to consult with Dr.Whitfield about his  issues. He has a history of total knee arthroplasty.  ? ?HPI ? ?Review of Systems  ?All other systems reviewed and are negative. ? ? ?Objective: ?Vital Signs: There were no vitals taken for this visit. ? ?Physical Exam ?Constitutional:   ?   Appearance: Normal appearance.  ?Pulmonary:  ?   Effort: Pulmonary effort is normal.  ?Neurological:  ?   Mental Status: He is alert.  ?Psychiatric:     ?   Mood and Affect: Mood normal.  ? ? ?Ortho Exam ?Examination of his right wrist he has brisk capillary refill pulses intact he does have some pain over the radiocarpal joint.  Some stiffness with the joint with motion no redness no cellulitis pulses intact ?Right knee no effusion swelling or warmth.  He points to his pain over the distal insertion of the quadricep tendon.  He does have the ability to hold his leg straight.  Quadriceps mechanism is palpable. ?Examination of his lower back he has no radicular findings.  He does have focal pain over the sacrum with palpation.  No redness no erythema.  His strength is at his baseline ?Specialty Comments:  ?  No specialty comments available. ? ?Imaging: ?No results found. ? ? ?PMFS History: ?Patient Active Problem List  ? Diagnosis Date Noted  ? Hemarthrosis of right knee 06/30/2020  ? Heterotopic calcification, postoperative 06/30/2020  ? Total knee replacement status, right 03/22/2020  ? Hematospermia 11/18/2015  ? Epididymal cyst 05/25/2015  ? Parkinson disease ([place]) 01/14/2014  ? Obesity (BMI 30-39.9) 07/14/2013  ? Confusional arousals 02/13/2013  ? Disorder of ligament of right wrist 01/29/2012  ? Diabetes mellitus type 2, uncomplicated ([place]) 02/14/3142  ? Mixed hyperlipidemia 10/09/2011  ? Unilateral primary osteoarthritis, right knee 10/09/2011  ? Essential hypertension, benign 10/09/2011  ? ?Past Medical History:  ?Diagnosis Date  ? Allergy   ? SEASONAL  ? Cataract   ? BILATERAL-REMOVED  ? Essential hypertension, benign   ? PT.DENIES ON AS PREVENTIVE 08/17/19  ? GERD  (gastroesophageal reflux disease)   ? Hx of adenomatous polyp of colon 09/04/2019  ? 08/2019 diminutive adenoma No recall given findings and age  ? Mixed hyperlipidemia   ? PT.DENIES,STATED ON AS PREVENTIVE 07/30/19  ? Neuromuscular disorder ([place])   ? PARKINSONS  ? OA (osteoarthritis) of knee   ? right  ? Obesity, unspecified   ? Osteoarthritis   ? KNEE AND SHOULDERS  ? Parkinsons ([place])   ? Type II or unspecified type diabetes mellitus without mention of complication, not stated as uncontrolled   ?  ?Family History  ?Problem Relation Age of Onset  ? Diabetes Mother   ? Lung cancer Mother   ? Cancer Father   ? Heart disease Father   ? Diabetes Sister   ? Diabetes Daughter   ? Colon polyps Son   ? Colon cancer Neg Hx   ? Esophageal cancer Neg Hx   ? Rectal cancer Neg Hx   ? Stomach cancer Neg Hx   ? Parkinson's disease Neg Hx   ?  ?Past Surgical History:  ?Procedure Laterality Date  ? CATARACT EXTRACTION, BILATERAL    ? GANGLION CYST EXCISION  age 63 years  ? right  ? KNEE ARTHROSCOPY    ? right  ? KNEE ARTHROSCOPY    ? TOTAL KNEE ARTHROPLASTY Right 03/08/2020  ? Procedure: RIGHT TOTAL KNEE ARTHROPLASTY;  Surgeon: Garald Balding, MD;  Location: [facility];  Service: Orthopedics;  Laterality: Right;  ? WRIST FRACTURE SURGERY    ? WRIST SURGERY  04/2012  ? ?Social History  ? ?Occupational History  ? Occupation: Chief Financial Officer  ?Tobacco Use  ? Smoking status: Former  ?  Packs/day: 1.50  ?  Years: 15.00  ?  Pack years: 22.50  ?  Types: Cigarettes  ?  Quit date: 12/17/1982  ?  Years since quitting: 39.2  ? Smokeless tobacco: Never  ? Tobacco comments:  ?  over 40 yrs  ?Vaping Use  ? Vaping Use: Never used  ?Substance and Sexual Activity  ? Alcohol use: Yes  ?  Comment: very rare  ? Drug use: No  ? Sexual activity: Yes  ?  Partners: Female  ? ? ? ? ? ? ?

## 2022-03-01 ENCOUNTER — Other Ambulatory Visit: Payer: Self-pay | Admitting: Neurology

## 2022-03-01 ENCOUNTER — Ambulatory Visit: Payer: Medicare Other | Admitting: Adult Health

## 2022-03-21 ENCOUNTER — Ambulatory Visit (HOSPITAL_COMMUNITY)
Admission: RE | Admit: 2022-03-21 | Discharge: 2022-03-21 | Disposition: A | Discharge status: Home / Self Care | Payer: Medicare Other | Source: Ambulatory Visit | Attending: Physician Assistant | Admitting: Physician Assistant

## 2022-03-21 DIAGNOSIS — M5440 Lumbago with sciatica, unspecified side: Secondary | ICD-10-CM | POA: Insufficient documentation

## 2022-03-29 ENCOUNTER — Encounter: Payer: Self-pay | Admitting: Orthopaedic Surgery

## 2022-03-29 ENCOUNTER — Ambulatory Visit (INDEPENDENT_AMBULATORY_CARE_PROVIDER_SITE_OTHER): Payer: Medicare Other | Admitting: Orthopaedic Surgery

## 2022-03-29 DIAGNOSIS — G20 Parkinson's disease: Secondary | ICD-10-CM | POA: Diagnosis not present

## 2022-03-29 DIAGNOSIS — M545 Low back pain, unspecified: Secondary | ICD-10-CM | POA: Insufficient documentation

## 2022-03-29 DIAGNOSIS — G8929 Other chronic pain: Secondary | ICD-10-CM | POA: Diagnosis not present

## 2022-03-29 DIAGNOSIS — M5442 Lumbago with sciatica, left side: Secondary | ICD-10-CM | POA: Diagnosis not present

## 2022-03-29 DIAGNOSIS — M5441 Lumbago with sciatica, right side: Secondary | ICD-10-CM | POA: Diagnosis not present

## 2022-03-29 NOTE — Progress Notes (Signed)
? ?Office Visit Note ?  ?Patient: Danny [name]           ?Date of Birth: 1951-07-02           ?MRN: [id number] ?Visit Date: 03/29/2022 ?             ?Requested by: [name], PA ?[address] ?[place],  [place] [address] ?PCP: [name], PA ? ? ?Assessment & Plan: ?Visit Diagnoses:  ?1. Parkinson disease ([place])   ?2. Chronic bilateral low back pain with bilateral sciatica   ? ? ?Plan: Adams is accompanied by his wife and here for follow-up evaluation of his back pain.  His medical history is complicated because he does have progressive Parkinson's and is a diabetic.  He has had a prior right total knee replacement and it seems that his Parkinson's actually had exacerbated after the procedure.  He has had several courses of physical therapy.  At 1 time I was concerned about the integrity of his right quadriceps muscle but the MRI scan revealed that it was intact.  There is some calcification within the muscle and he could have had even a partial tear.  It is weak but his biggest issue is with his Parkinson's.  He starts another course of therapy tomorrow for strengthening.  He does have significant compromise of his ability to get up and around because of his back pain.  He notes it is not a problem when he is sitting in a wheelchair or a when he is sleeping but trying to move it is uncomfortable and he is having mostly back pain although it does refer to one of the other leg.  When it is in 1 leg it is worse than on the left.  I obtained an MRI scan that demonstrated significant pathology at L4-5.  There was 2 mm grade 1 anterior listhesis with a superimposed broad-based left subarticular disc protrusion.  There was moderate facet arthrosis with ligamentum flavum hypertrophy and small projecting synovial cyst.  This disc protrusion results in moderate left subarticular stenosis contacting and crowding the descending left L5 nerve root.  There was mild right subarticular narrowing  present at the same level with slight crowding of the descending right L5 nerve root.  I certainly think that pathology could be causing of his back pain and his lower extremity discomfort.  I think from a diagnostic and therapeutic standpoint that an epidural steroid injection would be worthwhile.  I will contact Dr. Ernestina Patches.  I would like to see him back in about a month. ? ?Follow-Up Instructions: Return in about 1 month (around 04/28/2022).  ? ?Orders:  ?No orders of the defined types were placed in this encounter. ? ?No orders of the defined types were placed in this encounter. ? ? ? ? Procedures: ?No procedures performed ? ? ?Clinical Data: ?No additional findings. ? ? ?Subjective: ?Chief Complaint  ?Patient presents with  ? Lower Back - Follow-up  ?  MRI review  ?Patient presents today for follow up on his lower back. He had an MRI and is here today for those results.  No change in his symptoms ? ?HPI ? ?Review of Systems ? ? ?Objective: ?Vital Signs: There were no vitals taken for this visit. ? ?Physical Exam ?Constitutional:   ?   Appearance: He is well-developed.  ?Pulmonary:  ?   Effort: Pulmonary effort is normal.  ?Skin: ?   General: Skin is warm and dry.  ?Neurological:  ?  Mental Status: He is alert and oriented to person, place, and time.  ?Psychiatric:     ?   Behavior: Behavior normal.  ? ? ?Ortho Exam Mr. Rideaux is evaluated in his wheelchair with his wife present.  He seems to be getting weaker related to his Parkinson's.  He was comfortable sitting.  He was able to slowly extend his left knee but had an extensor lag on the right consistent with his the problem with his quadriceps muscle.  His right total knee replacement was not hot red or swollen.  Straight leg raise was negative.  There was some mild percussible tenderness to lower lumbar spine but none over the SI joints. ? ?Specialty Comments:  ?No specialty comments available. ? ?Imaging: ?No results found. ? ? ?PMFS History: ?Patient  Active Problem List  ? Diagnosis Date Noted  ? Low back pain 03/29/2022  ? Hemarthrosis of right knee 06/30/2020  ? Heterotopic calcification, postoperative 06/30/2020  ? Total knee replacement status, right 03/22/2020  ? Hematospermia 11/18/2015  ? Epididymal cyst 05/25/2015  ? Parkinson disease ([place]) 01/14/2014  ? Obesity (BMI 30-39.9) 07/14/2013  ? Confusional arousals 02/13/2013  ? Disorder of ligament of right wrist 01/29/2012  ? Diabetes mellitus type 2, uncomplicated ([place]) 25/95/6387  ? Mixed hyperlipidemia 10/09/2011  ? Unilateral primary osteoarthritis, right knee 10/09/2011  ? Essential hypertension, benign 10/09/2011  ? ?Past Medical History:  ?Diagnosis Date  ? Allergy   ? SEASONAL  ? Cataract   ? BILATERAL-REMOVED  ? Essential hypertension, benign   ? PT.DENIES ON AS PREVENTIVE 08/17/19  ? GERD (gastroesophageal reflux disease)   ? Hx of adenomatous polyp of colon 09/04/2019  ? 08/2019 diminutive adenoma No recall given findings and age  ? Mixed hyperlipidemia   ? PT.DENIES,STATED ON AS PREVENTIVE 07/30/19  ? Neuromuscular disorder ([place])   ? PARKINSONS  ? OA (osteoarthritis) of knee   ? right  ? Obesity, unspecified   ? Osteoarthritis   ? KNEE AND SHOULDERS  ? Parkinsons ([place])   ? Type II or unspecified type diabetes mellitus without mention of complication, not stated as uncontrolled   ?  ?Family History  ?Problem Relation Age of Onset  ? Diabetes Mother   ? Lung cancer Mother   ? Cancer Father   ? Heart disease Father   ? Diabetes Sister   ? Diabetes Daughter   ? Colon polyps Son   ? Colon cancer Neg Hx   ? Esophageal cancer Neg Hx   ? Rectal cancer Neg Hx   ? Stomach cancer Neg Hx   ? Parkinson's disease Neg Hx   ?  ?Past Surgical History:  ?Procedure Laterality Date  ? CATARACT EXTRACTION, BILATERAL    ? GANGLION CYST EXCISION  age 85 years  ? right  ? KNEE ARTHROSCOPY    ? right  ? KNEE ARTHROSCOPY    ? TOTAL KNEE ARTHROPLASTY Right 03/08/2020  ? Procedure: RIGHT TOTAL KNEE ARTHROPLASTY;  Surgeon:  Garald Balding, MD;  Location: [facility];  Service: Orthopedics;  Laterality: Right;  ? WRIST FRACTURE SURGERY    ? WRIST SURGERY  04/2012  ? ?Social History  ? ?Occupational History  ? Occupation: Chief Financial Officer  ?Tobacco Use  ? Smoking status: Former  ?  Packs/day: 1.50  ?  Years: 15.00  ?  Pack years: 22.50  ?  Types: Cigarettes  ?  Quit date: 12/17/1982  ?  Years since quitting: 39.3  ? Smokeless tobacco: Never  ? Tobacco comments:  ?  over 40 yrs  ?Vaping Use  ? Vaping Use: Never used  ?Substance and Sexual Activity  ? Alcohol use: Yes  ?  Comment: very rare  ? Drug use: No  ? Sexual activity: Yes  ?  Partners: Female  ? ? ? ? ? ? ?

## 2022-04-12 ENCOUNTER — Other Ambulatory Visit: Payer: Self-pay | Admitting: Adult Health

## 2022-04-17 ENCOUNTER — Ambulatory Visit (INDEPENDENT_AMBULATORY_CARE_PROVIDER_SITE_OTHER): Payer: Medicare Other | Admitting: Adult Health

## 2022-04-17 ENCOUNTER — Encounter: Payer: Self-pay | Admitting: Adult Health

## 2022-04-17 VITALS — BP 130/80 | HR 80 | Ht 68.0 in | Wt 176.2 lb

## 2022-04-17 DIAGNOSIS — G20 Parkinson's disease: Secondary | ICD-10-CM

## 2022-04-17 DIAGNOSIS — Z7409 Other reduced mobility: Secondary | ICD-10-CM | POA: Diagnosis not present

## 2022-04-17 NOTE — Progress Notes (Signed)
? ? ?PATIENT: Danny [name] ?DOB: 06/29/1951 ? ?REASON FOR VISIT: follow up ?HISTORY FROM: patient ?PRIMARY NEUROLOGIST: Dr. Rexene Alberts ?Chief Complaint  ?Patient presents with  ? Follow-up  ?  Rm 19, Parkinson's Disease.  Wife.  Doing PT HH, Mobility, energy level worse.  Balance worse fallen twice 04-07-2022.  Hallucinations.  ? ? ?HISTORY OF PRESENT ILLNESS: ?Today 04/17/22: ?Danny [name] is an 71 year old male with a history of parkinsons. He returns today for follow-up. Patient did wean off Mirapex. Not sure that symptoms changes drastically. Now uses wheelchair primarily. Tries to ambulate to bathroom during the night but has had several falls. Patient fell in February several times. Some of those times were syncopal events. Reports that have discussed with PCP syncopal events but that are not sure of cause per wife. Has HH PT coming once a week. Feeling fatigued no energy during the day. Takes a while to chew food- but has lost some teeth. Denies any trouble swallowing good. Sleeping ok at night other than getting up to go to the bathroom. Wife feels that strength has declined.  ? ?Still has daily hallucinations: office looks rearranged. Sees people in there doing it.  ? ?Metformin was decreased recently. ? ?HISTORY (copied from Dr. Guadelupe Sabin note) ? Patient's wife called in November 2022 reporting that patient had a decline in his health and that his speech had changed.  He was seen by his primary care nurse PA.  He presented to the emergency room on 11/08/2021 for slurring of speech lasting several days.  I reviewed the emergency room records.  He had a CT angiogram of the head and neck with and without contrast on 11/08/2021 and I reviewed the results:   ?IMPRESSION: ?1. No acute intracranial pathology or large vessel occlusion. ?2. Mild calcified atherosclerotic plaque in the bilateral carotid ?bulbs without hemodynamically significant stenosis or occlusion. ?Otherwise, patent vasculature in the head and  neck. ?  ?He had a brain MRI without contrast on 11/08/2021 and I reviewed the results: ?IMPRESSION: ?No evidence of acute intracranial abnormality.  No acute infarct. ?  ?Laboratory studies showed a benign urinalysis, CBC with differential benign, CMP showed BUN of 15, creatinine 1.15, normal sodium and potassium levels, normal liver enzymes, total bilirubin slightly elevated at 1.5. ?  ?Patient's wife called in January 2023 reporting that he has had a steady decline over the past 1 or 2 months.  She reported that he had COVID around Christmas 2022.  She reported a recent fall. ?  ?Today, 01/09/2022: He reports that he has had steady decline in his mobility and posture, he has had falls, he has had significantly low blood pressure values especially in the mornings, she gives him the half pill of Florinef depending on how low his blood pressure is.  He has been having constant hallucinations. She feels exhausted.  She has not had any help at the house.  She helps with showering and dressing.  She puts his compression socks on.  She tries to encourage hydration with propel water.  She has been giving him Sinemet half a pill at 8:30 AM and 10:30 AM and 1 pill at 1 PM, 3:30 PM, 6 PM and 8:30 PM daily.  She is not sure when she started with 1/2 pill for the first 2 doses.  We did not make this change in November.  He has had fainting spells.  He has fallen recently without major injuries but she had to take him to the ER  a few times. ? ?REVIEW OF SYSTEMS: Out of a complete 14 system review of symptoms, the patient complains only of the following symptoms, and all other reviewed systems are negative. ? ?ALLERGIES: ?Allergies  ?Allergen Reactions  ? Codeine Nausea And Vomiting  ? ? ?HOME MEDICATIONS: ?Outpatient Medications Prior to Visit  ?Medication Sig Dispense Refill  ? Ascorbic Acid (VITAMIN C ADULT GUMMIES PO) Take 2 tablets by mouth daily.     ? aspirin 81 MG EC tablet Take 81 mg by mouth daily. Swallow whole.    ?  carbidopa-levodopa (SINEMET CR) 50-200 MG tablet Take 1 tablet by mouth at bedtime. 30 tablet 11  ? carbidopa-levodopa (SINEMET IR) 25-100 MG tablet TAKE 1 TABLET BY MOUTH 6 (SIX) TIMES DAILY. 540 tablet 3  ? Cinnamon 500 MG capsule Take 500 mg by mouth daily.    ? famotidine (PEPCID) 20 MG tablet Take 20 mg by mouth in the morning and at bedtime.     ? fludrocortisone (FLORINEF) 0.1 MG tablet Take 0.5 tablets (0.05 mg total) by mouth daily. 30 tablet 5  ? Ginger, Zingiber officinalis, 550 MG CAPS Take 550 mg by mouth daily.    ? Glucosamine-Chondroit-Vit C-Mn (GLUCOSAMINE CHONDR 1500 COMPLX PO) Take 1,500 mg by mouth daily.    ? metFORMIN (GLUCOPHAGE) 1000 MG tablet Take 1 tablet (1,000 mg total) by mouth 2 (two) times daily with a meal. 20 tablet 0  ? Multiple Vitamin (MULTIVITAMIN) tablet Take 1 tablet by mouth daily.    ? QUEtiapine (SEROQUEL) 50 MG tablet Take 50 mg by mouth at bedtime.    ? glipiZIDE (GLUCOTROL XL) 10 MG 24 hr tablet TAKE 1 TABLET BY MOUTH DAILY (Patient taking differently: Take 10 mg by mouth at bedtime.) 90 tablet 3  ? ketoconazole (NIZORAL) 2 % cream Apply 1 application topically 2 (two) times daily.     ? meloxicam (MOBIC) 7.5 MG tablet Take 1 tablet (7.5 mg total) by mouth daily. 20 tablet 0  ? ?No facility-administered medications prior to visit.  ? ? ?PAST MEDICAL HISTORY: ?Past Medical History:  ?Diagnosis Date  ? Allergy   ? SEASONAL  ? Cataract   ? BILATERAL-REMOVED  ? Essential hypertension, benign   ? PT.DENIES ON AS PREVENTIVE 08/17/19  ? GERD (gastroesophageal reflux disease)   ? Hx of adenomatous polyp of colon 09/04/2019  ? 08/2019 diminutive adenoma No recall given findings and age  ? Mixed hyperlipidemia   ? PT.DENIES,STATED ON AS PREVENTIVE 07/30/19  ? Neuromuscular disorder ([place])   ? PARKINSONS  ? OA (osteoarthritis) of knee   ? right  ? Obesity, unspecified   ? Osteoarthritis   ? KNEE AND SHOULDERS  ? Parkinsons ([place])   ? Type II or unspecified type diabetes mellitus without  mention of complication, not stated as uncontrolled   ? ? ?PAST SURGICAL HISTORY: ?Past Surgical History:  ?Procedure Laterality Date  ? CATARACT EXTRACTION, BILATERAL    ? GANGLION CYST EXCISION  age 6 years  ? right  ? KNEE ARTHROSCOPY    ? right  ? KNEE ARTHROSCOPY    ? TOTAL KNEE ARTHROPLASTY Right 03/08/2020  ? Procedure: RIGHT TOTAL KNEE ARTHROPLASTY;  Surgeon: Garald Balding, MD;  Location: [facility];  Service: Orthopedics;  Laterality: Right;  ? WRIST FRACTURE SURGERY    ? WRIST SURGERY  04/2012  ? ? ?FAMILY HISTORY: ?Family History  ?Problem Relation Age of Onset  ? Diabetes Mother   ? Lung cancer Mother   ? Cancer  Father   ? Heart disease Father   ? Diabetes Sister   ? Diabetes Daughter   ? Colon polyps Son   ? Colon cancer Neg Hx   ? Esophageal cancer Neg Hx   ? Rectal cancer Neg Hx   ? Stomach cancer Neg Hx   ? Parkinson's disease Neg Hx   ? ? ?SOCIAL HISTORY: ?Social History  ? ?Socioeconomic History  ? Marital status: Married  ?  Spouse name: [name]  ? Number of children: 3  ? Years of education: 95  ? Highest education level: Bachelor's degree (e.g., BA, AB, BS)  ?Occupational History  ? Occupation: Chief Financial Officer  ?Tobacco Use  ? Smoking status: Former  ?  Packs/day: 1.50  ?  Years: 15.00  ?  Pack years: 22.50  ?  Types: Cigarettes  ?  Quit date: 12/17/1982  ?  Years since quitting: 39.3  ? Smokeless tobacco: Never  ? Tobacco comments:  ?  over 40 yrs  ?Vaping Use  ? Vaping Use: Never used  ?Substance and Sexual Activity  ? Alcohol use: Yes  ?  Comment: very rare  ? Drug use: No  ? Sexual activity: Yes  ?  Partners: Female  ?Other Topics Concern  ? Not on file  ?Social History Narrative  ? [employer], motorcycle rider.  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  He has 3 adult children.  His wife had 2 children, one of whom is deceased.  ? Patient is working full-time.  ? Patient has a Bachelor's degree.  ? Patient is right-handed.  ? Patient drinks 6 cups of coffee daily.  ? ?Social Determinants of Health   ? ?Financial Resource Strain: Not on file  ?Food Insecurity: Not on file  ?Transportation Needs: Not on file  ?Physical Activity: Not on file  ?Stress: Not on file  ?Social Connections: Not on file  ?Intimate Partner

## 2022-04-18 ENCOUNTER — Other Ambulatory Visit: Payer: Self-pay

## 2022-04-18 ENCOUNTER — Telehealth: Payer: Self-pay | Admitting: Orthopaedic Surgery

## 2022-04-18 ENCOUNTER — Telehealth: Payer: Self-pay | Admitting: *Deleted

## 2022-04-18 DIAGNOSIS — G8929 Other chronic pain: Secondary | ICD-10-CM

## 2022-04-18 NOTE — Telephone Encounter (Signed)
Wife called back and stated somewhere in [place] or [place] ?

## 2022-04-18 NOTE — Telephone Encounter (Signed)
Please call regarding injection -they have not heard anything  ?

## 2022-04-18 NOTE — Telephone Encounter (Signed)
We have an order for a motorized wheelchair. Need DME preference. Danny [name] (on [place]) and LVM (ok per DPR) with office number asking for call back to let phone staff know which DME company she would like to use.  ?

## 2022-04-18 NOTE — Telephone Encounter (Signed)
I faxed over to [facility] [phone], and faxed to [phone] Andria Rhein (power chairs).  With confirmation received by fax.  ?

## 2022-04-20 ENCOUNTER — Telehealth: Payer: Self-pay | Admitting: Physical Medicine and Rehabilitation

## 2022-04-20 NOTE — Telephone Encounter (Signed)
Patients spouse called in she missed the call about her husbands Cortizone injections in his back would like a call back ?

## 2022-04-27 ENCOUNTER — Telehealth: Payer: Self-pay | Admitting: Student

## 2022-04-27 NOTE — Telephone Encounter (Signed)
Spoke with patient's wife Danny [name] regarding the Palliative referral/services and all questions were answered and she was in agreement with scheduling visit.  I have scheduled an In-home Consult for 05/08/22 @ 2 PM ?

## 2022-05-02 NOTE — Telephone Encounter (Signed)
Georgena Spurling, RN ?[place],  ? ?I received your fax for patient's power wheelchair. I just faxed over to your attention a PT Mobility/Seating Evaluation rx for signature by Ward Givens, NP and once we receive it back, we will get patient scheduled.  ? ?Thank you and please let me know if you have any questions.  ? ?Andria Rhein  ?[facility]  ?[phone] ? ?

## 2022-05-08 ENCOUNTER — Other Ambulatory Visit: Payer: Medicare Other | Admitting: Student

## 2022-05-08 ENCOUNTER — Telehealth: Payer: Self-pay | Admitting: *Deleted

## 2022-05-08 DIAGNOSIS — G8929 Other chronic pain: Secondary | ICD-10-CM

## 2022-05-08 DIAGNOSIS — G20 Parkinson's disease: Secondary | ICD-10-CM

## 2022-05-08 DIAGNOSIS — Z515 Encounter for palliative care: Secondary | ICD-10-CM

## 2022-05-08 DIAGNOSIS — G20A1 Parkinson's disease without dyskinesia, without mention of fluctuations: Secondary | ICD-10-CM

## 2022-05-08 DIAGNOSIS — R531 Weakness: Secondary | ICD-10-CM

## 2022-05-08 DIAGNOSIS — R2681 Unsteadiness on feet: Secondary | ICD-10-CM

## 2022-05-08 NOTE — Telephone Encounter (Signed)
Fax confirmation received for electric WC, to [employer]. [phone].  (Signed by Jinny Blossom, NP)

## 2022-05-08 NOTE — Telephone Encounter (Signed)
Please call [facility] medicare is denying the patient because we didn't ruled out the cane and walker.Please call [phone]

## 2022-05-08 NOTE — Progress Notes (Signed)
[facility] Pall[facility] Consult Note Telephone: [phone]  Fax: [phone]   Date of encounter: 05/08/22 2:11 PM PATIENT NAME: Danny [name] [address] [place] [address]   [phone] (home)  DOB: [birth year] MRN: [id number] PRIMARY CARE PROVIDER:    [name], [place],  [address] [place] [address] [phone]  REFERRING PROVIDER:   [name], [address] [place],  [place] [address] [phone]  RESPONSIBLE PARTY:    Contact Information     Name Relation Home Work [place] [phone]        I met face to face with patient and family in he home. Palliative Care was asked to follow this patient by consultation request of  [name], PA to address advance care planning and complex medical decision making. This is the initial visit.                                     ASSESSMENT AND PLAN / RECOMMENDATIONS:   Advance Care Planning/Goals of Care: Goals include to maximize quality of life and symptom management. Patient/health care surrogate gave his/her permission to discuss.Our advance care planning conversation included a discussion about:    The value and importance of advance care planning  Experiences with loved ones who have been seriously ill or have died  Exploration of personal, cultural or spiritual beliefs that might influence medical decisions  Exploration of goals of care in the event of a sudden injury or illness  HCPOA-wife Reviewed code status; DNR completed and left in the home today. CODE STATUS: DNR  Education provided on palliative medicine vs. Hospice services. Wife wants patient to be able to remain in the home. Hospitalization as needed. Palliative to monitor for changes and declines; will refer to hospice when he meets criteria.   I spent 17 minutes providing this consultation. More than  50% of the time in this consultation was spent in counseling and care coordination.  ---------------------------------------------------------------------------------------------------------------  Symptom Management/Plan:  Parkinson's disease- continue carbidopa-levodopa as directed. Monitor for worsening of symptoms such as stiffness, functional declines, trouble swallowing, tremor. Follow up with Neurology as scheduled.    Knee pain- secondary to arthritis.Start acetaminophen 1000 mg BID routinely, lidocaine 4 % patch on for 12 hours/off 12 hours.   Generalized weakness, gait instability-secondary to Parkinson's disease. Continue working with PT, use walker for ambulation. Monitor for falls/safety. W/c for locomotion when going distances. Wife has requested a motorized w/c through PCP.   Follow up Palliative Care Visit: Palliative care will continue to follow for complex medical decision making, advance care planning, and clarification of goals. Return in 6 weeks or prn.   This visit was coded based on medical decision making (MDM).  PPS: 40%  HOSPICE ELIGIBILITY/DIAGNOSIS: TBD  Chief Complaint: Palliative Medicine initial visit.   HISTORY OF PRESENT ILLNESS:  Danny [name] is a 71 y.o. year old male  with Parkinson's, debility, hypertension, hyperlipidemia, T2DM.    Patient resides at home with his wife. He is currently receiving PT. Wife reports his physical function fluctuates. Today is a good day; able to ambulate more than normal. No recent falls; last fall 1 month ago. 6 falls since March 8th. Endorses pain to right knee; hx of knee replacement. Has lumbar pain; to have a steroid injection in June. Endorses shortness of  breath with exertion. Has limited vision, double and blurry vision. Has hallucinations. Appetite is good. Denies any swallowing difficulty; wife has to sometimes feed. Wife checks blood sugar each morning. Wife started to give 1000 mg metformin QHS and 500 mg each  morning due to blood sugar running 140-150's; down 118-120's since increasing HS dose of metformin. Sleeping okay, except when up and down to bathroom.    History obtained from review of EMR, discussion with primary team, and interview with family, facility staff/caregiver and/or Danny [name].  I reviewed available labs, medications, imaging, studies and related documents from the EMR.  Records reviewed and summarized above.   ROS  General: NAD EYES: denies vision changes ENMT: denies dysphagia Cardiovascular: denies chest pain, denies DOE Pulmonary: occasional cough, denies increased SOB Abdomen: endorses good appetite, occasional constipation, endorses continence of bowel GU: denies dysuria MSK: + weakness, Skin: denies rashes or wounds Neurological: denies pain, denies insomnia Psych: Endorses positive mood Heme/lymph/immuno: denies bruises, abnormal bleeding  Physical Exam: Weight 173 pounds Pulse 88, resp 16, b/p 102/64, sats 94% on room air Constitutional: NAD General: frail appearing EYES: anicteric sclera, lids intact, no discharge  ENMT: intact hearing, oral mucous membranes moist, dentition intact CV: S1S2, RRR, no LE edema Pulmonary: LCTA, no increased work of breathing, no cough, room air Abdomen: normo-active BS + 4 quadrants, soft and non tender, no ascites GU: deferred MSK: moves all extremities, ambulatory with walker Skin: warm and dry, no rashes or wounds on visible skin Neuro:  generalized weakness, A & O x 3, tremor Psych: non-anxious affect, pleasant Hem/lymph/immuno: no widespread bruising CURRENT PROBLEM LIST:  Patient Active Problem List   Diagnosis Date Noted   Low back pain 03/29/2022   Hemarthrosis of right knee 06/30/2020   Heterotopic calcification, postoperative 06/30/2020   Total knee replacement status, right 03/22/2020   Hematospermia 11/18/2015   Epididymal cyst 05/25/2015   Parkinson disease ([place]) 01/14/2014   Obesity (BMI 30-39.9)  07/14/2013   Confusional arousals 02/13/2013   Disorder of ligament of right wrist 01/29/2012   Diabetes mellitus type 2, uncomplicated (HCC) 10/18/1116   Mixed hyperlipidemia 10/09/2011   Unilateral primary osteoarthritis, right knee 10/09/2011   Essential hypertension, benign 10/09/2011   PAST MEDICAL HISTORY:  Active Ambulatory Problems    Diagnosis Date Noted   Diabetes mellitus type 2, uncomplicated ([place]) 35/67/0141   Mixed hyperlipidemia 10/09/2011   Unilateral primary osteoarthritis, right knee 10/09/2011   Essential hypertension, benign 10/09/2011   Disorder of ligament of right wrist 01/29/2012   Confusional arousals 02/13/2013   Obesity (BMI 30-39.9) 07/14/2013   Parkinson disease ([place]) 01/14/2014   Epididymal cyst 05/25/2015   Hematospermia 11/18/2015   Total knee replacement status, right 03/22/2020   Hemarthrosis of right knee 06/30/2020   Heterotopic calcification, postoperative 06/30/2020   Low back pain 03/29/2022   Resolved Ambulatory Problems    Diagnosis Date Noted   Hx of adenomatous polyp of colon 09/04/2019   Past Medical History:  Diagnosis Date   Allergy    Cataract    GERD (gastroesophageal reflux disease)    Neuromuscular disorder (HCC)    OA (osteoarthritis) of knee    Obesity, unspecified    Osteoarthritis    Parkinsons (HCC)    Type II or unspecified type diabetes mellitus without mention of complication, not stated as uncontrolled    SOCIAL HX:  Social History   Tobacco Use   Smoking status: Former    Packs/day: 1.50    Years: 15.00  Pack years: 22.50    Types: Cigarettes    Quit date: 12/17/1982    Years since quitting: 39.4   Smokeless tobacco: Never   Tobacco comments:    over 40 yrs  Substance Use Topics   Alcohol use: Yes    Comment: very rare   FAMILY HX:  Family History  Problem Relation Age of Onset   Diabetes Mother    Lung cancer Mother    Cancer Father    Heart disease Father    Diabetes Sister    Diabetes  Daughter    Colon polyps Son    Colon cancer Neg Hx    Esophageal cancer Neg Hx    Rectal cancer Neg Hx    Stomach cancer Neg Hx    Parkinson's disease Neg Hx       ALLERGIES:  Allergies  Allergen Reactions   Codeine Nausea And Vomiting     PERTINENT MEDICATIONS:  Outpatient Encounter Medications as of 05/08/2022  Medication Sig   Ascorbic Acid (VITAMIN C ADULT GUMMIES PO) Take 2 tablets by mouth daily.    aspirin 81 MG EC tablet Take 81 mg by mouth daily. Swallow whole.   carbidopa-levodopa (SINEMET CR) 50-200 MG tablet Take 1 tablet by mouth at bedtime.   carbidopa-levodopa (SINEMET IR) 25-100 MG tablet TAKE 1 TABLET BY MOUTH 6 (SIX) TIMES DAILY.   Cinnamon 500 MG capsule Take 500 mg by mouth daily.   famotidine (PEPCID) 20 MG tablet Take 20 mg by mouth in the morning and at bedtime.    fludrocortisone (FLORINEF) 0.1 MG tablet Take 0.5 tablets (0.05 mg total) by mouth daily.   Ginger, Zingiber officinalis, 550 MG CAPS Take 550 mg by mouth daily.   Glucosamine-Chondroit-Vit C-Mn (GLUCOSAMINE CHONDR 1500 COMPLX PO) Take 1,500 mg by mouth daily.   metFORMIN (GLUCOPHAGE) 1000 MG tablet Take 1 tablet (1,000 mg total) by mouth 2 (two) times daily with a meal.   Multiple Vitamin (MULTIVITAMIN) tablet Take 1 tablet by mouth daily.   QUEtiapine (SEROQUEL) 50 MG tablet Take 50 mg by mouth at bedtime.   No facility-administered encounter medications on file as of 05/08/2022.   Thank you for the opportunity to participate in the care of Mr. Bourcier.  The palliative care team will continue to follow. Please call our office at [phone] if we can be of additional assistance.   Ezekiel Slocumb, NP   COVID-19 PATIENT SCREENING TOOL Asked and negative response unless otherwise noted:  Have you had symptoms of covid, tested positive or been in contact with someone with symptoms/positive test in the past 5-10 days? No

## 2022-05-08 NOTE — Telephone Encounter (Signed)
Spoke to [employer] from [place]. She called to see if there was any documentation in ofv notes (not on order) that is stating what was in the order (about r/o not being able to use cane or walker) for them to keep the wheelchair (medicare has strict guidelines).  The office note did not have the information needed and no addendum can be done this late (30 days from last seen 11-01-2021).  She will speak to family to relay medicare denied this.  They will have to give the wheelchair back.  They do have one already (but had asked for a second one to keep in car).  She will not move further since in the process now of getting electric wheelchair.

## 2022-05-08 NOTE — Telephone Encounter (Signed)
I have faxed over the [employer] p/w for electric wheelchair with fax cofirmation received.   I see that the pt has used cane and walker in Epic 2021-2022 notes.   I LMVM below, calling about electric WC for pt.

## 2022-05-23 ENCOUNTER — Encounter: Payer: Self-pay | Admitting: Physical Medicine and Rehabilitation

## 2022-05-23 ENCOUNTER — Ambulatory Visit (INDEPENDENT_AMBULATORY_CARE_PROVIDER_SITE_OTHER): Payer: Medicare Other | Admitting: Physical Medicine and Rehabilitation

## 2022-05-23 ENCOUNTER — Ambulatory Visit: Payer: Self-pay

## 2022-05-23 VITALS — BP 90/64 | HR 96

## 2022-05-23 DIAGNOSIS — M5416 Radiculopathy, lumbar region: Secondary | ICD-10-CM

## 2022-05-23 MED ORDER — METHYLPREDNISOLONE ACETATE 80 MG/ML IJ SUSP
80.0000 mg | Freq: Once | INTRAMUSCULAR | Status: AC
  Administered 2022-05-23: 80 mg

## 2022-05-23 NOTE — Patient Instructions (Signed)

## 2022-05-23 NOTE — Progress Notes (Unsigned)
Pt state lower back pain. Pt state walking, standing and laying down makes the pain worse. Pt state he takes over the counter pain meds to help ease his pain.  Numeric Pain Rating Scale and Functional Assessment Average Pain 2   In the last MONTH (on 0-10 scale) has pain interfered with the following?  1. General activity like being  able to carry out your everyday physical activities such as walking, climbing stairs, carrying groceries, or moving a chair?  Rating(8)   +Driver, -BT, -Dye Allergies.

## 2022-05-28 ENCOUNTER — Other Ambulatory Visit: Payer: Self-pay | Admitting: Adult Health

## 2022-05-30 ENCOUNTER — Telehealth: Payer: Self-pay | Admitting: Student

## 2022-05-30 NOTE — Telephone Encounter (Signed)
Entry for 05/29/22 at 3: 17pm. Palliative NP returned call to patient's wife. She states he his has had 3 episodes of bright red blood in his stool in the past week. She is unsure if he has hemorrhoids. He has also been more tired and fatigued. She has placed a call to his PCP and is awaiting a return call. She is instructed to hold his aspirin. Recommend evaluation in ED; she states she will see what his PCP says when they return call.

## 2022-06-20 ENCOUNTER — Ambulatory Visit: Payer: Medicare Other | Attending: Adult Health | Admitting: Physical Therapy

## 2022-06-20 ENCOUNTER — Encounter: Payer: Self-pay | Admitting: Physical Therapy

## 2022-06-20 DIAGNOSIS — R2689 Other abnormalities of gait and mobility: Secondary | ICD-10-CM | POA: Insufficient documentation

## 2022-06-20 DIAGNOSIS — R262 Difficulty in walking, not elsewhere classified: Secondary | ICD-10-CM | POA: Insufficient documentation

## 2022-06-20 DIAGNOSIS — R269 Unspecified abnormalities of gait and mobility: Secondary | ICD-10-CM | POA: Insufficient documentation

## 2022-06-20 NOTE — Therapy (Signed)
OUTPATIENT PHYSICAL THERAPY WHEELCHAIR EVALUATION   Patient Name: Danny [name] MRN: [id number] DOB:[birth year], 71 y.o., male Today's Date: 06/20/2022   PT End of Session - 06/20/22 1117     Visit Number 1    Number of Visits 1    Date for PT Re-Evaluation 06/27/22    PT Start Time 1100    PT Stop Time 1210    PT Time Calculation (min) 70 min    Equipment Utilized During Treatment Gait belt;Other (comment)   Manual Wheelchair   Behavior During Therapy WFL for tasks assessed/performed             Past Medical History:  Diagnosis Date   Allergy    SEASONAL   Cataract    BILATERAL-REMOVED   Essential hypertension, benign    PT.DENIES ON AS PREVENTIVE 08/17/19   GERD (gastroesophageal reflux disease)    Hx of adenomatous polyp of colon 09/04/2019   08/2019 diminutive adenoma No recall given findings and age   Mixed hyperlipidemia    PT.DENIES,STATED ON AS PREVENTIVE 07/30/19   Neuromuscular disorder (HCC)    PARKINSONS   OA (osteoarthritis) of knee    right   Obesity, unspecified    Osteoarthritis    KNEE AND SHOULDERS   Parkinsons ([place])    Type II or unspecified type diabetes mellitus without mention of complication, not stated as uncontrolled    Past Surgical History:  Procedure Laterality Date   CATARACT EXTRACTION, BILATERAL     GANGLION CYST EXCISION  age 3 years   right   KNEE ARTHROSCOPY     right   KNEE ARTHROSCOPY     TOTAL KNEE ARTHROPLASTY Right 03/08/2020   Procedure: RIGHT TOTAL KNEE ARTHROPLASTY;  Surgeon: Garald Balding, MD;  Location: [facility];  Service: Orthopedics;  Laterality: Right;   WRIST FRACTURE SURGERY     WRIST SURGERY  04/2012   Patient Active Problem List   Diagnosis Date Noted   Low back pain 03/29/2022   Hemarthrosis of right knee 06/30/2020   Heterotopic calcification, postoperative 06/30/2020   Total knee replacement status, right 03/22/2020   Hematospermia 11/18/2015   Epididymal cyst 05/25/2015   Parkinson disease ([place])  01/14/2014   Obesity (BMI 30-39.9) 07/14/2013   Confusional arousals 02/13/2013   Disorder of ligament of right wrist 01/29/2012   Diabetes mellitus type 2, uncomplicated ([place]) 87/68/1157   Mixed hyperlipidemia 10/09/2011   Unilateral primary osteoarthritis, right knee 10/09/2011   Essential hypertension, benign 10/09/2011    PCP: [name], PA  REFERRING PROVIDER: Ward Givens, NP  REFERRING DIAG:  Z63.09 (ICD-10-CM) - Other reduced mobility  G20 (ICD-10-CM) - Parkinson's disease    THERAPY DIAG:  Abnormality of gait and mobility  Difficulty in walking, not elsewhere classified  Other abnormalities of gait and mobility  ONSET DATE: 04/20/2010  Rationale for Evaluation and Treatment Habilitation  SUBJECTIVE:  SUBJECTIVE STATEMENT: Pt has had PD for prolonged period. Pt has had recent loss in weight and has had a lot of recent loss of weight.  PERTINENT HISTORY:  Pt has history of PD diagnosis approximately 12 years ago. Pt and caregiver report 9 falls in the last 6 months which place him at high risk for fall related injury. Pt presents with caregiver for [facility] evaluation this date.    PAIN:  Are you having pain? Yes:    PRECAUTIONS: Fall  WEIGHT BEARING RESTRICTIONS No   FALLS:  Has patient fallen in last 6 months? Yes. Number of falls 9  LIVING ENVIRONMENT: Lives with: lives with their family Lives in: House/apartment Stairs: No Has following equipment at home: Environmental consultant - 2 wheeled, Wheelchair (manual), shower chair, Grab bars, and Ramped entry  OCCUPATION: Retired   PLOF: Needs assistance with ADLs, Needs assistance with homemaking, Needs assistance with gait, and Needs assistance with transfers  [place] wheelchair to improve mobility and function     PATIENT INFORMATION: This Evaluation form will serve as the LMN for the following suppliers:  Supplier: [employer] Person: Luz Brazen  Phone:[phone]   Reason for Referral: Patient/caregiver Goals: Patient was seen for face-to-face evaluation for new power wheelchair.  Also present was    his wife and with Luz Brazen from [employer] present    to discuss recommendations and wheelchair options.  Further paperwork was completed and sent to vendor.  Patient appears to qualify for manual mobility device at this time per objective findings.   MEDICAL HISTORY: Diagnosis: Parkinson's Disease  Primary Diagnosis Onset: '[x]' Progressive Disease Relevant Past and Future Surgeries: Height: 5 ' 8"  Weight:176 lb Explain and recent changes or trends in weight:   Relevant History including falls: Pt has had 9 falls in the last 6 months. Pt is currently renting wheelchair but is is very heavy and is not appropriate for pt to assist caregiver with propulsion effectively.  Patient has had relevant history of right knee replacement, low back pain, as well as long history of battle with Parkinson's disease.  Patient's wife is primary caregiver and patient and patient's wife had difficulty with propelling wheelchair in and out of house via their ramp.  They do report ramp is up to standards of ramp incline.  Patient has been using wheelchair as primary means of ambulation for over a year now.  Patient has noted to ambulate with rolling walker patient does frequently fall.     HOME ENVIRONMENT: '[x]' House  '[]' Condo/town home  '[]' Apartment  '[]' Assisted Living    '[]' Lives Alone '[x]'  Lives with Others                                                    Hours with caregiver: 24  '[x]' Home is accessible to patient            Stairs  '[]' Yes '[]'  No     Ramp '[x]' Yes '[]' No Comments:     COMMUNITY ADL: TRANSPORTATION: '[x]' Car    '[]' Van    '[]' Public Transportation    '[]' Adapted w/c Lift   '[]' Ambulance    '[]' Other:       '[]' Sits in wheelchair during transport  Employment/School:     Specific requirements pertaining to mobility  Other:                                       FUNCTIONAL/SENSORY PROCESSING SKILLS:  Handedness:   '[x]' Right     '[]' Left    '[]' NA  Comments:                                 Functional Processing Skills for Wheeled Mobility '[]' Processing Skills are adequate for safe wheelchair operation  Areas of concern than may interfere with safe operation of wheelchair Description of problem   '[]'  Attention to environment     '[]' Judgment     '[]'  Hearing  '[x]'  Vision or visual processing    '[]' Motor Planning  '[]'  Fluctuations in Behavior                                                  Double vision    VERBAL COMMUNICATION: '[x]' WFL receptive '[x]'  WFL expressive '[x]' Understandable  '[]' Difficult to understand  '[]' non-communicative '[]'  Uses an augmented communication device    CURRENT SEATING / MOBILITY: Current Mobility Base:   '[]' None  '[]' Dependent  '[x]' Manual  '[]' Scooter  '[]' Power   Type of Control:                       Manufacturer:       Drive                   Size:                         Age:         1 year                   Current Condition of Mobility Base:                                                                                                                     Current Wheelchair components:  Describe posture in present seating system:                                                                            SENSATION and SKIN ISSUES: Sensation '[x]' Intact '[]' Impaired '[]' Absent   Level of sensation:                           Pressure Relief: Able to perform effective pressure relief :   '[x]' Yes  '[]'  No Method:     weight shifting, lifting with upper extremities on wheelchair ( pt  demonstrates both in evall)                                                                          If not, Why?:                                                                          Skin Issues/Skin Integrity Current Skin Issues   '[]' Yes '[x]' No  '[]' Intact '[]'  Red area '[]'  Open Area  '[]' Scar Tissue '[]' At risk from prolonged sitting  Where                              History of Skin Issues   '[]' Yes '[x]' No  Where                                         When                                               Hx of skin flap surgeries '[]' Yes '[x]' No  Where                  When                                                  Limited sitting tolerance '[]' Yes '[x]' No Hours spent sitting in wheelchair daily:  Complaint of Pain:  Please describe:     Pt has no complaints of pain at this time.                                                                                                         Swelling/Edema:                                                                                                                                                  ADL STATUS (in reference to wheelchair use):  Indep Assist Unable Indep with Equip Not assessed Comments  Dressing                 X                                                        Eating                 X                                                                                                             Toileting                   X  Bathing             X                                                                                                                         Grooming/ Hygiene              X                                                                                                                Meal Prep                  X                                                                                                         IADLS               X                                                                                                   Bowel Management: '[x]' Continent  '[]' Incontinent  '[]' Accidents Comments:                                                  Bladder Management: '[x]' Continent  '[]' Incontinent  '[]' Accidents Comments:  WHEELCHAIR SKILLS: Manual w/c Propulsion: '[x]' UE or LE strength and endurance sufficient to participate in ADLs using manual wheelchair Arm :  '[x]' left '[x]' right  '[x]' Both                                   Foot:   '[]' left '[]' right  '[x]' Both  Distance: 40 feet in clinic ambulation  Operate Scooter: '[]'  Strength, hand grip, balance and transfer appropriate for use '[]' Living environment is accessible for use of scooter  Operate Power w/c:  '[]'  Std. Joystick   '[]'  Alternative Controls Indep '[]'  Assist '[]'  Dependent/ Unable '[]'  N/A '[]'  '[]' Safe          '[]'  Functional      Distance:                Bed confined without wheelchair '[x]'  Yes '[]'  No   STRENGTH/RANGE OF MOTION:  Range of Motion Active  Strength  Shoulder          100 flexion bilateral                                             4/5 grossly         ( abduction and flexion in available range of motion)                                                Elbow                   WNL                                        4+/5 flex and ext                                                     Wrist/Hand         WNL                                                            4+/5 all except r extension ( 4/5)                                                                    Hip         WNL  4 grosly                                                      Knee Right 35 - 120   Left 31-120                                            4/5 within available motion                                                         Ankle WNL           WNL                                                             MOBILITY/BALANCE:  '[]'  Patient is totally dependent for mobility                                                                                               Balance Transfers Ambulation  Sitting Balance: Standing Balance: '[]'  Independent '[]'  Independent/Modified Independent  '[]'  WFL     '[]'  WFL '[]'  Supervision '[]'  Supervision  '[x]'  Uses UE for balance  '[]'  Supervision '[]'  Min Assist '[]'  Ambulates with Assist                           '[]'  Min Assist '[x]'  Min assist '[x]'  Mod Assist '[x]'  Ambulates with Device:  '[]'  RW   '[]'  StW   '[]'  Cane   '[x]'     wheelchair            '[]'  Mod Assist '[]'  Mod assist '[]'  Max assist   '[]'  Max Assist '[]'  Max assist '[]'  Dependent '[]'  Indep. Short Distance Only  '[]'  Unable '[]'  Unable '[]'  Lift / Sling Required Distance (in feet)                             '[]'  Sliding board '[]'  Unable to Ambulate: (Explain:  Cardio Status:  '[x]' Intact  '[]'  Impaired   '[]'  NA                              Respiratory Status:  '[x]' Intact   '[]' Impaired   '[]' NA  Orthotics/Prosthetics:                                                                         Comments (Address manual vs power w/c vs scooter):   Impression:             Patient's most appropriate wheelchair utilization is the manual wheelchair.  Patient still has strength in his upper and lower extremities to propel wheelchair but does not have strength, coordination, or balance, to safely ambulate without wheelchair without significant risk of falling.  As evidenced by patient's 9 falls in the last 6 months.  These falls put him at increased risk of severe injury due to falling such as head injury hip fractures etc.  Please see below check boxes for justification of each component of wheelchair.                                               Anterior / Posterior Obliquity Rotation-Pelvis  PELVIS     '[]' Neutral  '[x]'  Posterior  '[]'  Anterior     '[]' WFL  '[]' Right Elevated  '[]' Left Elevated   '[]' WFL  '[]' Right Anterior '[]'   Left Anterior    '[]'  Fixed '[]'  Partly Flexible '[]'  Flexible  '[]'  Other  '[]'  Fixed  '[]'  Partly Flexible  '[]'  Flexible '[]'  Other  '[]'  Fixed  '[]'  Partly Flexible  '[]'  Flexible '[]'  Other  TRUNK '[]' WFL '[x]' Thoracic Kyphosis '[]' Lumbar Lordosis   '[]'  WFL '[]' Convex Right '[]' Convex Left   '[]' c-curve '[]' s-curve '[]' multiple  '[]'  Neutral '[]'  Left-anterior '[]'  Right-anterior    '[]'  Fixed '[]'  Flexible '[x]'  Partly Flexible       Other  '[]'  Fixed '[]'  Flexible '[]'  Partly Flexible '[]'  Other  '[]'  Fixed           '[]'  Flexible '[]'  Partly Flexible '[]'  Other   Position Windswept   HIPS  '[x]'  Neutral '[]'  Abduct '[]'  ADduct '[x]'  Neutral '[]'  Right '[]'  Left       '[]'  Fixed  '[]'  Partly Flexible             '[]'  Dislocated '[]'  Flexible '[]'  Subluxed    '[]'  Fixed '[]'  Partly Flexible  '[]'  Flexible '[]'  Other              Foot Positioning Knee Positioning   Knees and  Feet  '[x]'  WFL '[]' Left '[]' Right '[x]'  WFL '[]' Left '[]' Right   KNEES ROM concerns: ROM concerns:   & Dorsi-Flexed                    '[]' Lt '[]' Rt                                  FEET Plantar Flexed                  '[]' Lt '[]' Rt     Inversion                    '[]' Lt '[]' Rt     Eversion                    '[]'   Lt '[]' Rt    HEAD '[]'  Functional '[]'  Good Head Control   & '[x]'  Flexed         '[]'  Extended '[x]'  Adequate Head Control   NECK '[]'  Rotated  Lt  '[]'  Lat Flexed Lt '[]'  Rotated  Rt '[]'  Lat Flexed Rt '[]'  Limited Head Control    '[]'  Cervical Hyperextension '[]'  Absent  Head Control    SHOULDERS ELBOWS WRIST& HAND         Left     Right    Left     Right  U/E '[]' Functional  Left            '[]' Functional  Right                                 '[]' Fisting             '[]' Fisting     '[]' elevated Left '[]' depressed  Left '[]' elevated Right '[]' depressed  Right      '[]' protracted Left '[]' retracted Left '[]' protracted Right '[]' retracted Right '[]' subluxed  Left              '[]' subluxed   Right         Goals for Wheelchair Mobility  '[x]'  Independence with mobility in the home with motor related ADLs (MRADLs)  '[x]'  Independence with MRADLs in the community '[x]'  Provide dependent mobility  '[]'  Provide recline     '[]' Provide tilt   Goals for Seating system '[x]'  Optimize pressure distribution '[x]'  Provide support needed to facilitate function or safety '[x]'  Provide corrective forces to assist with maintaining or improving posture '[]'  Accommodate client's posture: current seated postures and positions are not flexible or will not tolerate corrective forces '[x]'  Client to be independent with relieving pressure in the wheelchair '[x]' Enhance physiological function such as breathing, swallowing, digestion  Simulation ideas/Equipment trials:                                                                                                State why other equipment was unsuccessful:    Current wheelchair is very heavy and does not allow for ease of mobility as patient has decreased upper and lower extremity strength secondary to his progressive neurological disease.  He later weight wheelchair were better for patient to be mobile within his home as well as in his community.  Patient and caregiver currently had trouble accessing their home secondary to wheelchair weight difficulty accessing the ramp with manual wheelchair.  The lighter weight and recommended wheelchair will improve patient's ability to participate in community activities as well as increase patient's independence and decrease his risk of falls and to make to ambulate with a rolling walker.  MOBILITY BASE RECOMMENDATIONS and JUSTIFICATION: MOBILITY COMPONENT JUSTIFICATION  Manufacturer:    [employer]       Model:   Catalyst 5            Size: Width   ATP to measure pt         Seat Depth    ATP to measure   '[x]' provide transport from point A to B  '[x]' promote Indep mobility  '[x]' is not a safe, functional ambulator '[x]' walker or cane inadequate '[]' non-standard width/depth necessary to accommodate anatomical measurement '[]'                             '[x]' Manual Mobility Base  '[x]' non-functional ambulator    '[]' Scooter/POV  '[]' can safely operate  '[]' can safely transfer   '[]' has adequate trunk stability  '[]' cannot functionally propel manual w/c  '[]' Power Mobility Base  '[]' non-ambulatory  '[]' cannot functionally propel manual wheelchair  '[]'  cannot functionally and safely operate scooter/POV '[]' can safely operate and willing to  '[]' Stroller Base '[]' infant/child  '[]' unable to propel manual wheelchair '[]' allows for growth '[]' non-functional ambulator '[]' non-functional UE '[]' Indep mobility is not a goal at this time  '[]' Tilt  '[]' Forward                   '[]' Backward                  '[]' Powered tilt              '[]' Manual tilt  '[]' change position against gravitational force on head and shoulders  '[]' change position for pressure relief/cannot weight shift '[]' transfers  '[]' management of tone '[]' rest periods '[]' control edema '[]' facilitate postural control  '[]'                                       '[]' Recline  '[]' Power recline on power base '[]' Manual recline on manual base  '[]' accommodate femur to back angle  '[]' bring to full recline for ADL care  '[]' change position for pressure relief/cannot weight shift '[]' rest periods '[]' repositioning for transfers or clothing/diaper /catheter changes '[]' head positioning  '[x]' Lighter weight required '[x]' self- propulsion  '[x]' lifting '[]'                                                 '[]' Heavy Duty required '[]' user weight greater than 250# '[]' extreme tone/ over active movement '[]' broken frame on previous chair '[]'                                     '[x]'  Back  '[]'  Angle Adjustable '[]'  Custom molded '[x]'  Tension adjustable                             '[x]' postural control '[]' control of tone/spasticity '[x]' accommodation of range of motion '[]' UE functional  control '[]' accommodation for seating system '[]'                                          '[]' provide lateral trunk support '[]' accommodate deformity '[x]' provide posterior trunk support '[]' provide lumbar/sacral support '[x]' support trunk in midline '[]' Pressure  relief over spinal processes  '[x]'  Seat Cushion Axiom G                       '[]' impaired sensation  '[]' decubitus ulcers present '[]' history of pressure ulceration '[]' prevent pelvic extension '[]' low maintenance  '[x]' stabilize pelvis  '[]' accommodate obliquity '[]' accommodate multiple deformity '[]' neutralize lower extremity position '[x]' increase pressure distribution                                '[]'  Pelvic/thigh support  '[]'  Lateral thigh guide '[]'  Distal medial pad  '[]'  Distal lateral pad '[]'  pelvis in neutral '[]' accommodate pelvis '[]'  position upper legs '[]'  alignment '[]'  accommodate ROM '[]'  decrease adduction '[]' accommodate tone '[]' removable for transfers '[]' decrease abduction  '[]'  Lateral trunk Supports '[]'  Lt     '[]'  Rt '[]' decrease lateral trunk leaning '[]' control tone '[]' contour for increased contact '[]' safety  '[]' accommodate asymmetry '[]'                                                 '[]'  Mounting hardware  '[]' lateral trunk supports  '[]' back   '[]' seat '[]' headrest      '[]'  thigh support '[]' fixed   '[]' swing away '[]' attach seat platform/cushion to w/c frame '[]' attach back cushion to w/c frame '[]' mount postural supports '[]' mount headrest  '[]' swing medial thigh support away '[]' swing lateral supports away for transfers  '[]'                                                     Armrests  '[]' fixed '[x]' adjustable height '[]' removable   '[]' swing away  '[x]' flip back   '[]' reclining '[x]' full length pads '[]' desk    '[]' pads tubular  '[x]' provide support with elbow at 90   '[x]' provide support for w/c tray '[]' change of height/angles for variable activities '[]' remove for transfers '[x]' allow to come closer to table top '[]' remove for access to tables '[]'                                                Hangers/ Leg rests  '[]' 60 '[x]' 70 '[]' 90 '[]' elevating '[]' heavy duty  '[]' articulating '[]' fixed '[x]' lift off '[x]' swing away     '[]' power '[x]' provide LE support  '[]' accommodate to hamstring tightness '[]' elevate legs during recline   '[]' provide change in position for Legs '[x]' Maintain placement of feet on footplate '[]' durability '[x]' enable transfers '[]' decrease edema '[]' Accommodate lower leg length '[]'                                         Foot support Footplate    '[x]' Lt  '[x]'  Rt  '[]'  Center mount '[x]' flip up                            '[]' depth/angle adjustable '[]' Amputee adapter    '[]'  Lt     '[]'  Rt '[x]' provide foot support '[]' accommodate to ankle ROM '[]' transfers '[]' Provide support for residual extremity '[x]'  allow foot to go under  wheelchair base '[]'  decrease tone  '[]'                                                 '[x]'  Ankle strap/heel loops '[x]' support foot on foot support '[x]' decrease extraneous movement '[]' provide input to heel  '[]' protect foot  Tires: '[]' pneumatic  '[]' flat free inserts  '[x]' solid  '[x]' decrease maintenance  '[x]' prevent frequent flats '[]' increase shock absorbency '[]' decrease pain from road shock '[]' decrease spasms from road shock '[]'                                              '[]'  Headrest  '[]' provide posterior head support '[]' provide posterior neck support '[]' provide lateral head support '[]' provide anterior head support '[]' support during tilt and recline '[]' improve feeding   '[]' improve respiration '[]' placement of switches '[]' safety  '[]' accommodate ROM  '[]' accommodate tone '[]' improve visual orientation  '[]'  Anterior chest strap '[]'  Vest '[]'  Shoulder retractors  '[]' decrease forward movement of shoulder '[]' accommodation of TLSO '[]' decrease forward movement of trunk '[]' decrease shoulder elevation '[]' added abdominal support '[]' alignment '[]' assistance with shoulder control  '[]'                                               Pelvic Positioner '[x]' Belt '[]' SubASIS bar '[]' Dual Pull '[]' stabilize tone '[x]' decrease falling  out of chair/ **will not Decrease potential for sliding due to pelvic tilting '[]' prevent excessive rotation '[]' pad for protection over boney prominence '[]' prominence comfort '[]' special pull angle to control rotation '[]'                                                  Upper ExtremitySupport  '[]' L   '[]'  R '[]' Arm trough   '[]' hand support '[]'  tray       '[x]' full tray '[]' swivel mount '[]' decrease edema      '[]' decrease subluxation   '[]' control tone   '[x]' placement for AAC/Computer/EADL '[]' decrease gravitational pull on shoulders '[x]' provide midline positioning '[x]' provide support to increase UE function '[x]' provide hand support in natural position '[]' provide work surface   POWER WHEELCHAIR CONTROLS  '[]' Proportional  '[]' Non-Proportional Type                                      '[]' Left  '[]' Right '[]' provides access for controlling wheelchair   '[]' lacks motor control to operate proportional drive control '[]' unable to understand proportional controls  Actuator Control Module  '[]' Single  '[]' Multiple   '[]' Allow the client to operate the power seat function(s) through the joystick control   '[]' Safety Reset Switches '[]' Used to change modes and stop the wheelchair when driving in latch mode    '[]' [employer]   '[]' programming for accurate control '[]' progressive Disease/changing condition '[]' non-proportional drive control needed '[]' Needed in order to operate power seat functions through joystick control   '[]' Display box '[]' Allows user to see in which mode and drive the wheelchair is set  '[]' necessary for alternate controls    '[]' Digital interface electronics '[]' Allows w/c  to operate when using alternative drive controls  '[]' ASL Head Array '[]' Allows client to operate wheelchair  through switches placed in tri-panel headrest  '[]' Sip and puff with tubing kit '[]' needed to operate sip and puff drive controls  '[]' Upgraded tracking electronics '[]' increase safety when driving '[]' correct tracking when on uneven surfaces  '[]' Mount for switches  or joystick '[]' Attaches switches to w/c  '[]' Swing away for access or transfers '[]' midline for optimal placement '[]' provides for consistent access  '[]' Attendant controlled joystick plus mount '[]' safety '[]' long distance driving '[]' operation of seat functions '[]' compliance with transportation regulations '[]'                                             Rear wheel placement/Axle adjustability '[]' None '[]' semi adjustable '[x]' fully adjustable  '[x]' improved UE access to wheels '[]' improved stability '[]' changing angle in space for improvement of postural stability '[]' 1-arm drive access '[]' amputee pad placement '[]'                                Wheel rims/ hand rims  '[]' metal   '[x]' plastic coated '[]' oblique projections           '[]' vertical projections '[x]' Provide ability to propel manual wheelchair  '[]'  Increase self-propulsion with hand weakness/decreased grasp  Push handles '[]' extended   '[]' angle adjustable              '[x]' standard '[x]' caregiver access '[x]' caregiver assist '[]' allows "hooking" to enable increased ability to perform ADLs or maintain balance  One armed device   '[]' Lt   '[]' Rt '[]' enable propulsion of manual wheelchair with one arm   '[]'                                            Brake/wheel lock extension '[x]'  Lt   '[x]'  Rt '[x]' increase indep in applying wheel locks   '[]' Side guards '[]' prevent clothing getting caught in wheel or becoming soiled '[]'  prevent skin tears/abrasions  Battery:                                            '[]' to power wheelchair                                                         Other:      Anti Tippers                                          '[x]'  To prevent chair from tipping backwards  The above equipment has a life- long use expectancy. Growth and changes in medical and/or functional conditions would be the exceptions. This is to certify that the therapist has no financial relationship with durable medical provider or  manufacturer. The therapist will not receive remuneration of any kind for the equipment recommended in this evaluation.   Patient has mobility limitation that significantly impairs safe, timely participation in one or more mobility related ADL's. (bathing, toileting, feeding, dressing, grooming, moving from room to room)  '[x]'  Yes '[]'  No  Will mobility device sufficiently improve ability to participate and/or be aided in participation of MRADL's?      '[x]'  Yes '[]'  No  Can limitation be compensated for with use of a cane or walker?                                    '[]'  Yes '[x]'  No  Does patient or caregiver demonstrate ability/potential ability & willingness to safely use the mobility device?    '[x]'  Yes '[]'  No  Does patient's home environment support use of recommended mobility device?            '[x]'  Yes '[]'  No  Does patient have sufficient upper extremity function necessary to functionally propel a manual wheelchair?     '[x]'  Yes '[]'  No  Does patient have sufficient strength and trunk stability to safely operate a POV (scooter)?                                  '[x]'  Yes '[]'  No  Does patient need additional features/benefits provided by a power wheelchair for MRADL's in the home?        '[x]'  Yes '[]'  No  Does the patient demonstrate the ability to safely use a power wheelchair?                   '[x]'  Yes '[]'  No     Physician's Name Printed:                                                        Physician's Signature:  Date:     This is to certify that I, the above signed therapist have the following affiliations: '[]'  This DME provider '[]'  Manufacturer of recommended equipment '[]'  Patient's long term care facility '[x]'  None of the above  Therapist Name/Signature:       Rivka Barbara PT, DPT                                       Date:06/20/22   ASSESSMENT:  CLINICAL IMPRESSION: Patient is a 70y.o. male who was seen today for physical therapy evaluation and treatment for his progressive  Parkinson's disease and evaluation for a new and more adequate wheelchair for his current needs.  Patient's current wheelchair is too heavy and is not appropriately situated for functional ambulation.  Patient and caregiver have significant difficulty accessing their environment due to difficulties getting in and outside of the home.  Patient's current wheelchair also does not accommodate his upper extremities to allow for significant assistance with  propulsion and also does not allow his lower extremities to significantly improve his propulsion.  Patient will benefit from new wheelchair in order to improve his mobility, decrease his risk of falls with attempting to ambulate with a rolling walker, as well as improve his overall quality of life and access to his environment.   OBJECTIVE IMPAIRMENTS decreased mobility, difficulty walking, and decreased strength.   ACTIVITY LIMITATIONS locomotion level  PARTICIPATION LIMITATIONS: community activity and church  REHAB POTENTIAL: Good  CLINICAL DECISION MAKING: Stable/uncomplicated  EVALUATION COMPLEXITY: Low  GOALS: Target date: 07/04/2022    Pt and caregivers will understand PT recommendation and appropriate/safe use for wheelchair and seating for home use. Baseline: no recommendation prior to today  Goal status: INITIAL   PLAN: PT FREQUENCY: 1x/week  PT DURATION: 1 week  PLANNED INTERVENTIONS: Therapeutic exercises, Therapeutic activity, Neuromuscular re-education, Balance training, Gait training, Patient/Family education, and Joint mobilization.     Particia Lather, PT 06/20/2022, 1:36 PM

## 2022-06-25 ENCOUNTER — Other Ambulatory Visit: Payer: Medicare Other | Admitting: Student

## 2022-06-25 DIAGNOSIS — Z515 Encounter for palliative care: Secondary | ICD-10-CM

## 2022-06-25 DIAGNOSIS — I959 Hypotension, unspecified: Secondary | ICD-10-CM

## 2022-06-25 DIAGNOSIS — R531 Weakness: Secondary | ICD-10-CM

## 2022-06-25 DIAGNOSIS — G20 Parkinson's disease: Secondary | ICD-10-CM

## 2022-06-25 DIAGNOSIS — K625 Hemorrhage of anus and rectum: Secondary | ICD-10-CM

## 2022-06-25 NOTE — Progress Notes (Signed)
[facility] Pall[facility] Care Consult Note Telephone: [phone]  Fax: [phone]    Date of encounter: 06/25/22 12:35 PM PATIENT NAME: Danny [name] [address] [place] [address]   [phone] (home)  DOB: 12/06/1951 MRN: [id number] PRIMARY CARE PROVIDER:    [name], [place],  [address] [place] [address] [phone]  REFERRING PROVIDER:   [name], [address] [place],  [place] [address] [phone]  RESPONSIBLE PARTY:    Contact Information     Name Relation Home Work [place] [phone]        I met face to face with patient and family in the home. Palliative Care was asked to follow this patient by consultation request of  [name], PA to address advance care planning and complex medical decision making. This is a follow up visit.                                   ASSESSMENT AND PLAN / RECOMMENDATIONS:   Advance Care Planning/Goals of Care: Goals include to maximize quality of life and symptom management. Patient/health care surrogate gave his/her permission to discuss. Our advance care planning conversation included a discussion about:    The value and importance of advance care planning  Experiences with loved ones who have been seriously ill or have died  Exploration of personal, cultural or spiritual beliefs that might influence medical decisions  Exploration of goals of care in the event of a sudden injury or illness  Identification of a healthcare agent- wife HCPOA CODE STATUS: DNR  Education provided on Palliative Medicine vs. Hospice. Discussed aggressive treatment vs. Comfort path. Patient currently receiving [place] therapy and would like to continue. Education on changes and declines regarding his Parkinson's. Patient and wife express they are not ready for hospice yet. Will continue to provide ongoing  support, education and symptom management as needed.   Symptom Management/Plan:  Parkinson's disease- endorses increased stiffness. Is having days where he is weaker, not feeling well. Having hypotensive episodes. Receiving fludrocortisone; wife has started giving full tablet in past two days. Will notify Neurologist regarding dosage. Education provided on ways to help improve hypotension such as compression socks, not changing positions fast. He is eating normal amounts of salt. Continue carbidopa-levodopa as directed. Follow up with neurology as scheduled.   Rectal bleeding- no further episodes; awaiting GI referral to be scheduled. Monitor for further episodes, hemorrhoids, straining with bowel movements.   Generalized weakness, gait instability-secondary to Parkinson's disease. Continue working with PT, use walker for ambulation. Monitor for falls/safety. W/c for locomotion when going distances.  Follow up Palliative Care Visit: Palliative care will continue to follow for complex medical decision making, advance care planning, and clarification of goals. Return in 4 weeks or prn.  This visit was coded based on medical decision making (MDM).  PPS: 40%  HOSPICE ELIGIBILITY/DIAGNOSIS: TBD  Chief Complaint: Palliative Medicine follow up visit.   HISTORY OF PRESENT ILLNESS:  Danny [name] is a 71 y.o. year old male  with Parkinson's, debility, hypertension, hyperlipidemia, T2DM.  Patient is having a good day today per wife, but had a bad week last week. Wife reports patient having low blood pressures 07'P-71'G systolic. "Passed out" on 6/29 and fell on 6/30 and 06/20/22 with no apparent injury. Appetite has been good; no dysphagia reported.  Weight 168 pounds yesterday. Feels he is drinking adequate fluids. Checking blood sugars every morning. Reports increased stiffness, freezing episodes. Voice is weaker at times, mostly at night. No further episodes of rectal bleeding. A 10-point ROS is  negative, except for the pertinent positives and negatives detailed per the HPI.  History obtained from review of EMR, discussion with primary team, and interview with family, facility staff/caregiver and/or Danny [name].  I reviewed available labs, medications, imaging, studies and related documents from the EMR.  Records reviewed and summarized above.    Physical Exam: Pulse 80, resp 16, b/p 98/58, sats 99% on room air Constitutional: NAD General: frail appearing EYES: anicteric sclera, lids intact, no discharge  ENMT: intact hearing, oral mucous membranes moist, dentition intact CV: S1S2, RRR, no LE edema Pulmonary: LCTA, no increased work of breathing, no cough, room air Abdomen: normo-active BS + 4 quadrants, soft and non tender GU: deferred MSK: moves all extremities, ambulatory Skin: warm and dry, no rashes or wounds on visible skin Neuro: + generalized weakness, A & O x 3, tremor Psych: non-anxious affect Hem/lymph/immuno: no widespread bruising   Thank you for the opportunity to participate in the care of Danny [name].  The palliative care team will continue to follow. Please call our office at [phone] if we can be of additional assistance.   Ezekiel Slocumb, NP   COVID-19 PATIENT SCREENING TOOL Asked and negative response unless otherwise noted:   Have you had symptoms of covid, tested positive or been in contact with someone with symptoms/positive test in the past 5-10 days? No

## 2022-06-27 ENCOUNTER — Telehealth: Payer: Self-pay | Admitting: Adult Health

## 2022-06-27 MED ORDER — FLUDROCORTISONE ACETATE 0.1 MG PO TABS: 0.1000 mg | ORAL_TABLET | Freq: Every day | ORAL | 5 refills | Status: DC

## 2022-06-27 NOTE — Addendum Note (Signed)
Addended by: [name] on: 06/27/2022 12:18 PM   Modules accepted: Orders

## 2022-06-27 NOTE — Telephone Encounter (Signed)
Spoke with pt's wife and let her know that Dr Rexene Alberts was ok with increasing florinef to full tablet daily and Rx has been sent to pharmacy. She was very Patent attorney.

## 2022-06-27 NOTE — Telephone Encounter (Signed)
Looks like BP was 98/58 on 06/25/22 at pall care visit. Okay to increase fludrocortisone to 0.1 mg daily.  Rx sent to pharmacy.

## 2022-06-27 NOTE — Telephone Encounter (Signed)
Pt's wife states over the weekend pt's blood pressure dropped so low that she wasn't sure if pt was going to make it.  Wife states she decided to give pt a whole tablet fludrocortisone (FLORINEF) 0.1 MG tablet, instead of 1/2.  Wife states when Palliative Care came on Monday she informed them of what she did and they told her that was ok.  Wife states she was led to believe they were going to request the change in medication to Dr Rexene Alberts.  Wife is asking for a new Rx to be sent to pharmacy([id number] for pt to get 1 full tablet daily

## 2022-07-01 ENCOUNTER — Encounter (HOSPITAL_BASED_OUTPATIENT_CLINIC_OR_DEPARTMENT_OTHER): Payer: Self-pay

## 2022-07-01 ENCOUNTER — Inpatient Hospital Stay (HOSPITAL_BASED_OUTPATIENT_CLINIC_OR_DEPARTMENT_OTHER)
Admission: EM | Admit: 2022-07-01 | Discharge: 2022-07-05 | DRG: 417 | Disposition: A | Discharge status: Home Health | Payer: Medicare Other | Attending: Internal Medicine | Admitting: Internal Medicine

## 2022-07-01 ENCOUNTER — Emergency Department (HOSPITAL_BASED_OUTPATIENT_CLINIC_OR_DEPARTMENT_OTHER): Payer: Medicare Other

## 2022-07-01 ENCOUNTER — Other Ambulatory Visit: Payer: Self-pay

## 2022-07-01 DIAGNOSIS — K8020 Calculus of gallbladder without cholecystitis without obstruction: Secondary | ICD-10-CM | POA: Diagnosis not present

## 2022-07-01 DIAGNOSIS — Z8249 Family history of ischemic heart disease and other diseases of the circulatory system: Secondary | ICD-10-CM

## 2022-07-01 DIAGNOSIS — K810 Acute cholecystitis: Principal | ICD-10-CM

## 2022-07-01 DIAGNOSIS — Z8371 Family history of colonic polyps: Secondary | ICD-10-CM | POA: Diagnosis not present

## 2022-07-01 DIAGNOSIS — Z885 Allergy status to narcotic agent status: Secondary | ICD-10-CM | POA: Diagnosis not present

## 2022-07-01 DIAGNOSIS — K869 Disease of pancreas, unspecified: Secondary | ICD-10-CM | POA: Diagnosis not present

## 2022-07-01 DIAGNOSIS — G249 Dystonia, unspecified: Secondary | ICD-10-CM | POA: Diagnosis present

## 2022-07-01 DIAGNOSIS — R443 Hallucinations, unspecified: Secondary | ICD-10-CM | POA: Diagnosis present

## 2022-07-01 DIAGNOSIS — Z801 Family history of malignant neoplasm of trachea, bronchus and lung: Secondary | ICD-10-CM | POA: Diagnosis not present

## 2022-07-01 DIAGNOSIS — Z7984 Long term (current) use of oral hypoglycemic drugs: Secondary | ICD-10-CM | POA: Diagnosis not present

## 2022-07-01 DIAGNOSIS — K219 Gastro-esophageal reflux disease without esophagitis: Secondary | ICD-10-CM | POA: Diagnosis present

## 2022-07-01 DIAGNOSIS — E782 Mixed hyperlipidemia: Secondary | ICD-10-CM | POA: Diagnosis present

## 2022-07-01 DIAGNOSIS — Z7982 Long term (current) use of aspirin: Secondary | ICD-10-CM

## 2022-07-01 DIAGNOSIS — Z833 Family history of diabetes mellitus: Secondary | ICD-10-CM

## 2022-07-01 DIAGNOSIS — R413 Other amnesia: Secondary | ICD-10-CM | POA: Diagnosis present

## 2022-07-01 DIAGNOSIS — F05 Delirium due to known physiological condition: Secondary | ICD-10-CM | POA: Diagnosis present

## 2022-07-01 DIAGNOSIS — I10 Essential (primary) hypertension: Secondary | ICD-10-CM | POA: Diagnosis present

## 2022-07-01 DIAGNOSIS — Z96651 Presence of right artificial knee joint: Secondary | ICD-10-CM | POA: Diagnosis present

## 2022-07-01 DIAGNOSIS — E119 Type 2 diabetes mellitus without complications: Secondary | ICD-10-CM | POA: Diagnosis present

## 2022-07-01 DIAGNOSIS — G20 Parkinson's disease: Secondary | ICD-10-CM | POA: Diagnosis present

## 2022-07-01 DIAGNOSIS — K859 Acute pancreatitis without necrosis or infection, unspecified: Secondary | ICD-10-CM | POA: Diagnosis present

## 2022-07-01 DIAGNOSIS — R29810 Facial weakness: Secondary | ICD-10-CM | POA: Diagnosis present

## 2022-07-01 DIAGNOSIS — R1013 Epigastric pain: Secondary | ICD-10-CM | POA: Diagnosis present

## 2022-07-01 DIAGNOSIS — G903 Multi-system degeneration of the autonomic nervous system: Secondary | ICD-10-CM

## 2022-07-01 DIAGNOSIS — G20A1 Parkinson's disease without dyskinesia, without mention of fluctuations: Secondary | ICD-10-CM | POA: Diagnosis present

## 2022-07-01 DIAGNOSIS — Z87891 Personal history of nicotine dependence: Secondary | ICD-10-CM

## 2022-07-01 DIAGNOSIS — I951 Orthostatic hypotension: Secondary | ICD-10-CM | POA: Diagnosis present

## 2022-07-01 DIAGNOSIS — Z66 Do not resuscitate: Secondary | ICD-10-CM | POA: Diagnosis present

## 2022-07-01 DIAGNOSIS — K5900 Constipation, unspecified: Secondary | ICD-10-CM | POA: Diagnosis present

## 2022-07-01 DIAGNOSIS — R296 Repeated falls: Secondary | ICD-10-CM | POA: Diagnosis present

## 2022-07-01 DIAGNOSIS — R6510 Systemic inflammatory response syndrome (SIRS) of non-infectious origin without acute organ dysfunction: Secondary | ICD-10-CM | POA: Diagnosis present

## 2022-07-01 DIAGNOSIS — A419 Sepsis, unspecified organism: Secondary | ICD-10-CM

## 2022-07-01 DIAGNOSIS — Z79899 Other long term (current) drug therapy: Secondary | ICD-10-CM

## 2022-07-01 DIAGNOSIS — I959 Hypotension, unspecified: Secondary | ICD-10-CM

## 2022-07-01 LAB — COMPREHENSIVE METABOLIC PANEL
ALT: 6 U/L (ref 0–44)
AST: 23 U/L (ref 15–41)
Albumin: 3.5 g/dL (ref 3.5–5.0)
Alkaline Phosphatase: 72 U/L (ref 38–126)
Anion gap: 8 (ref 5–15)
BUN: 19 mg/dL (ref 8–23)
CO2: 24 mmol/L (ref 22–32)
Calcium: 9.7 mg/dL (ref 8.9–10.3)
Chloride: 106 mmol/L (ref 98–111)
Creatinine, Ser: 0.93 mg/dL (ref 0.61–1.24)
GFR, Estimated: >60 mL/min (ref >60)
Glucose, Bld: 140 mg/dL — ABNORMAL HIGH (ref 70–99)
Potassium: 4.7 mmol/L (ref 3.5–5.1)
Sodium: 138 mmol/L (ref 135–145)
Total Bilirubin: 1.2 mg/dL (ref 0.3–1.2)
Total Protein: 6.7 g/dL (ref 6.5–8.1)

## 2022-07-01 LAB — URINALYSIS, ROUTINE W REFLEX MICROSCOPIC
Bilirubin Urine: NEGATIVE
Glucose, UA: NEGATIVE mg/dL
Hgb urine dipstick: NEGATIVE
Ketones, ur: NEGATIVE mg/dL
Leukocytes,Ua: NEGATIVE
Nitrite: NEGATIVE
Protein, ur: NEGATIVE mg/dL
Specific Gravity, Urine: 1.015 (ref 1.005–1.030)
pH: 5.5 (ref 5.0–8.0)

## 2022-07-01 LAB — CBC WITH DIFFERENTIAL/PLATELET
Abs Immature Granulocytes: 0.06 10*3/uL (ref 0.00–0.07)
Basophils Absolute: 0 10*3/uL (ref 0.0–0.1)
Basophils Relative: 0 %
Eosinophils Absolute: 0.1 10*3/uL (ref 0.0–0.5)
Eosinophils Relative: 1 %
HCT: 39.1 % (ref 39.0–52.0)
Hemoglobin: 13.2 g/dL (ref 13.0–17.0)
Immature Granulocytes: 0 %
Lymphocytes Relative: 10 %
Lymphs Abs: 1.4 10*3/uL (ref 0.7–4.0)
MCH: 31.4 pg (ref 26.0–34.0)
MCHC: 33.8 g/dL (ref 30.0–36.0)
MCV: 92.9 fL (ref 80.0–100.0)
Monocytes Absolute: 0.9 10*3/uL (ref 0.1–1.0)
Monocytes Relative: 7 %
Neutro Abs: 11.2 10*3/uL — ABNORMAL HIGH (ref 1.7–7.7)
Neutrophils Relative %: 82 %
Platelets: 198 10*3/uL (ref 150–400)
RBC: 4.21 MIL/uL — ABNORMAL LOW (ref 4.22–5.81)
RDW: 14.1 % (ref 11.5–15.5)
WBC: 13.7 10*3/uL — ABNORMAL HIGH (ref 4.0–10.5)
nRBC: 0 % (ref 0.0–0.2)

## 2022-07-01 LAB — TROPONIN I (HIGH SENSITIVITY)
Troponin I (High Sensitivity): 5 ng/L (ref <18)
Troponin I (High Sensitivity): 5 ng/L (ref <18)

## 2022-07-01 LAB — LACTIC ACID, PLASMA
Lactic Acid, Venous: 2.9 mmol/L (ref 0.5–1.9)
Lactic Acid, Venous: 2.5 mmol/L (ref 0.5–1.9)
Lactic Acid, Venous: 2.4 mmol/L (ref 0.5–1.9)

## 2022-07-01 LAB — GLUCOSE, CAPILLARY
Glucose-Capillary: 112 mg/dL — ABNORMAL HIGH (ref 70–99)
Glucose-Capillary: 132 mg/dL — ABNORMAL HIGH (ref 70–99)

## 2022-07-01 LAB — LIPASE, BLOOD: Lipase: 29 U/L (ref 11–51)

## 2022-07-01 MED ORDER — ACETAMINOPHEN 650 MG RE SUPP: 650.0000 mg | Freq: Four times a day (QID) | RECTAL | Status: DC | PRN

## 2022-07-01 MED ORDER — ASPIRIN 81 MG PO TBEC
81.0000 mg | DELAYED_RELEASE_TABLET | Freq: Every day | ORAL | Status: DC
  Administered 2022-07-01 – 2022-07-05 (×5): 81 mg via ORAL
  Filled 2022-07-01 (×5): qty 1

## 2022-07-01 MED ORDER — ACETAMINOPHEN 325 MG PO TABS: 650.0000 mg | ORAL_TABLET | Freq: Four times a day (QID) | ORAL | Status: DC | PRN

## 2022-07-01 MED ORDER — FAMOTIDINE 20 MG PO TABS
20.0000 mg | ORAL_TABLET | Freq: Two times a day (BID) | ORAL | Status: DC
  Administered 2022-07-01 – 2022-07-05 (×9): 20 mg via ORAL
  Filled 2022-07-01 (×9): qty 1

## 2022-07-01 MED ORDER — INSULIN ASPART 100 UNIT/ML IJ SOLN
0.0000 [IU] | Freq: Three times a day (TID) | INTRAMUSCULAR | Status: DC
  Administered 2022-07-02: 2 [IU] via SUBCUTANEOUS
  Administered 2022-07-02: 1 [IU] via SUBCUTANEOUS
  Administered 2022-07-03 – 2022-07-04 (×4): 2 [IU] via SUBCUTANEOUS
  Administered 2022-07-05: 1 [IU] via SUBCUTANEOUS
  Administered 2022-07-05: 2 [IU] via SUBCUTANEOUS

## 2022-07-01 MED ORDER — QUETIAPINE FUMARATE 25 MG PO TABS: 50.0000 mg | ORAL_TABLET | Freq: Every day | ORAL | Status: DC

## 2022-07-01 MED ORDER — CARBIDOPA-LEVODOPA 25-100 MG PO TABS
1.0000 | ORAL_TABLET | Freq: Every day | ORAL | Status: DC
  Administered 2022-07-01 – 2022-07-05 (×21): 1 via ORAL
  Filled 2022-07-01 (×22): qty 1

## 2022-07-01 MED ORDER — SODIUM CHLORIDE 0.9 % IV BOLUS
500.0000 mL | Freq: Once | INTRAVENOUS | Status: AC
  Administered 2022-07-01: 500 mL via INTRAVENOUS

## 2022-07-01 MED ORDER — PIPERACILLIN-TAZOBACTAM 3.375 G IVPB 30 MIN
3.3750 g | Freq: Once | INTRAVENOUS | Status: AC
  Administered 2022-07-01: 3.375 g via INTRAVENOUS
  Filled 2022-07-01: qty 50

## 2022-07-01 MED ORDER — CARBIDOPA-LEVODOPA 25-100 MG PO TABS
1.0000 | ORAL_TABLET | Freq: Every day | ORAL | Status: DC
  Filled 2022-07-01: qty 1

## 2022-07-01 MED ORDER — FLUDROCORTISONE ACETATE 0.1 MG PO TABS
0.1000 mg | ORAL_TABLET | Freq: Every day | ORAL | Status: DC
  Administered 2022-07-01: 0.1 mg via ORAL
  Filled 2022-07-01: qty 1

## 2022-07-01 MED ORDER — ONDANSETRON HCL 4 MG PO TABS: 4.0000 mg | ORAL_TABLET | Freq: Four times a day (QID) | ORAL | Status: DC | PRN

## 2022-07-01 MED ORDER — ENOXAPARIN SODIUM 40 MG/0.4ML IJ SOSY
40.0000 mg | PREFILLED_SYRINGE | INTRAMUSCULAR | Status: DC
  Filled 2022-07-01: qty 0.4

## 2022-07-01 MED ORDER — INSULIN ASPART 100 UNIT/ML IJ SOLN: 0.0000 [IU] | Freq: Every day | INTRAMUSCULAR | Status: DC

## 2022-07-01 MED ORDER — POTASSIUM CHLORIDE IN NACL 20-0.9 MEQ/L-% IV SOLN
INTRAVENOUS | Status: DC
  Filled 2022-07-01 (×4): qty 1000

## 2022-07-01 MED ORDER — ONDANSETRON HCL 4 MG/2ML IJ SOLN: 4.0000 mg | Freq: Four times a day (QID) | INTRAMUSCULAR | Status: DC | PRN

## 2022-07-01 MED ORDER — HYDROMORPHONE HCL 1 MG/ML IJ SOLN: 0.5000 mg | INTRAMUSCULAR | Status: DC | PRN

## 2022-07-01 MED ORDER — PIPERACILLIN-TAZOBACTAM 3.375 G IVPB
3.3750 g | Freq: Three times a day (TID) | INTRAVENOUS | Status: DC
  Administered 2022-07-01 – 2022-07-03 (×6): 3.375 g via INTRAVENOUS
  Filled 2022-07-01 (×6): qty 50

## 2022-07-01 MED ORDER — PIPERACILLIN-TAZOBACTAM 3.375 G IVPB 30 MIN: 3.3750 g | Freq: Three times a day (TID) | INTRAVENOUS | Status: DC

## 2022-07-01 MED ORDER — IOHEXOL 350 MG/ML SOLN
100.0000 mL | Freq: Once | INTRAVENOUS | Status: AC | PRN
  Administered 2022-07-01: 100 mL via INTRAVENOUS

## 2022-07-01 MED ORDER — SIMVASTATIN 20 MG PO TABS
20.0000 mg | ORAL_TABLET | Freq: Every day | ORAL | Status: DC
  Administered 2022-07-01 – 2022-07-04 (×4): 20 mg via ORAL
  Filled 2022-07-01 (×4): qty 1

## 2022-07-01 MED ORDER — QUETIAPINE FUMARATE ER 50 MG PO TB24
50.0000 mg | ORAL_TABLET | Freq: Every day | ORAL | Status: DC
  Administered 2022-07-01 – 2022-07-04 (×4): 50 mg via ORAL
  Filled 2022-07-01 (×5): qty 1

## 2022-07-01 MED ORDER — FLUDROCORTISONE ACETATE 0.1 MG PO TABS
0.1000 mg | ORAL_TABLET | Freq: Every day | ORAL | Status: DC
  Administered 2022-07-02 – 2022-07-05 (×4): 0.1 mg via ORAL
  Filled 2022-07-01 (×4): qty 1

## 2022-07-01 MED ORDER — FLUDROCORTISONE ACETATE 0.1 MG PO TABS: 0.1000 mg | ORAL_TABLET | Freq: Every day | ORAL | Status: DC

## 2022-07-01 MED ORDER — CARBIDOPA-LEVODOPA ER 50-200 MG PO TBCR
1.0000 | EXTENDED_RELEASE_TABLET | Freq: Every day | ORAL | Status: DC
  Administered 2022-07-01 – 2022-07-04 (×4): 1 via ORAL
  Filled 2022-07-01 (×4): qty 1

## 2022-07-01 NOTE — ED Notes (Signed)
[facility] at bedside 

## 2022-07-01 NOTE — Progress Notes (Signed)
Plan of Care Note for accepted transfer   Patient: Danny [name] MRN: [id number]   DOA: 07/01/2022  Facility requesting transfer: [facility].  Requesting Provider: Aletta Edouard, MD. Reason for transfer: Sepsis in the setting of acute cholecystitis. Facility course:   Per Dr. Melina Copa: " Chief Complaint  Patient presents with   Anxiety    Danny [name] is a 71 y.o. male.  He is brought in by ambulance from home and his wife is helping assist with history.  Has a history of Parkinson's and low blood pressure.  Since last evening has been having trouble with pain in his upper abdomen feeling like heartburn.  She has been giving him some Tums without improvement.  He was up all night.  Around 5 this morning he was short of breath.  The symptoms seem to have improved.  She also feels the right side of his face is drooped.  He denies any headache numbness weakness blurry vision double vision.  No cough or current abdominal pain.  He said he has little bit of chest pain.  No shortness of breath.  No diarrhea or constipation no urinary symptoms.  He has fairly advanced Parkinson's and uses a walker, no recent falls"  Lab work: Lactic acid, plasma [[id number]] (Abnormal)   Collected: 07/01/22 0929   Updated: 07/01/22 1016   Specimen Type: Blood    Lactic Acid, Venous 2.5 High Panic   mmol/L  Troponin I (High Sensitivity) [[id number]]   Collected: 07/01/22 0929   Updated: 07/01/22 1008    Troponin I (High Sensitivity) 5 ng/L  Urinalysis, Routine w reflex microscopic Urine, Clean Catch [[id number]]   Collected: 07/01/22 0922   Updated: 07/01/22 0941   Specimen Source: Urine, Clean Catch    Color, Urine YELLOW   APPearance CLEAR   Specific Gravity, Urine 1.015   pH 5.5   Glucose, UA NEGATIVE mg/dL   Hgb urine dipstick NEGATIVE   Bilirubin Urine NEGATIVE   Ketones, ur NEGATIVE mg/dL   Protein, ur NEGATIVE mg/dL   Nitrite NEGATIVE   Leukocytes,Ua NEGATIVE  Troponin I (High  Sensitivity) [[id number]]   Collected: 07/01/22 0724   Updated: 07/01/22 0801    Troponin I (High Sensitivity) 5 ng/L  Lactic acid, plasma [[id number]] (Abnormal)   Collected: 07/01/22 0724   Updated: 07/01/22 0755   Specimen Type: Blood    Lactic Acid, Venous 2.9 High Panic   mmol/L  Comprehensive metabolic panel [[id number]] (Abnormal)   Collected: 07/01/22 0724   Updated: 07/01/22 0752   Specimen Type: Blood    Sodium 138 mmol/L   Potassium 4.7 mmol/L   Chloride 106 mmol/L   CO2 24 mmol/L   Glucose, Bld 140 High  mg/dL   BUN 19 mg/dL   Creatinine, Ser 0.93 mg/dL   Calcium 9.7 mg/dL   Total Protein 6.7 g/dL   Albumin 3.5 g/dL   AST 23 U/L   ALT 6 U/L   Alkaline Phosphatase 72 U/L   Total Bilirubin 1.2 mg/dL   GFR, Estimated >60 mL/min   Anion gap 8  Lipase, blood [[id number]]   Collected: 07/01/22 0724   Updated: 07/01/22 0752   Specimen Type: Blood    Lipase 29 U/L  CBC with Differential [[id number]] (Abnormal)   Collected: 07/01/22 0724   Updated: 07/01/22 0734   Specimen Type: Blood    WBC 13.7 High  K/uL   RBC 4.21 Low  MIL/uL   Hemoglobin 13.2 g/dL   HCT 39.1 %  MCV 92.9 fL   MCH 31.4 pg   MCHC 33.8 g/dL   RDW 14.1 %   Platelets 198 K/uL   nRBC 0.0 %   Neutrophils Relative % 82 %   Neutro Abs 11.2 High  K/uL   Lymphocytes Relative 10 %   Lymphs Abs 1.4 K/uL   Monocytes Relative 7 %   Monocytes Absolute 0.9 K/uL   Eosinophils Relative 1 %   Eosinophils Absolute 0.1 K/uL   Basophils Relative 0 %   Basophils Absolute 0.0 K/uL   Immature Granulocytes 0 %   Abs Immature Granulocytes 0.06 K/uL   CTA chest/abdomen/pelvis. IMPRESSION: 1. No evidence of pulmonary embolism or other acute cardiopulmonary process. 2. Mild-moderate gallbladder distension with diffuse gallbladder wall thickening and pericholecystic fluid/stranding, suggestive of acute cholecystitis. Further evaluation with right upper quadrant ultrasound is recommended. 3. Appearance of the  pancreas suggests mild acute uncomplicated pancreatitis. Correlate with serum lipase. There is a short segment of mild pancreatic ductal dilatation within the pancreatic tail. 4. Slightly increased attenuation within the central mesentery with several small mesenteric lymph nodes, which can be seen in the setting of mesenteric panniculitis. 5. Moderate volume of stool within the colon. 6. Aortic and coronary artery atherosclerosis (ICD10-I70.0).   Electronically Signed   By: Davina Poke D.O.   On: 07/01/2022 09:51  Plan of care: He received Zosyn and normal saline 500 mL x 2 bolus.  The patient is accepted for admission to Progressive unit, at [facility]..    Author: Reubin Milan, MD 07/01/2022  Check [email or web address] for on-call coverage.  Nursing staff, Please call [place] number on Amion as soon as patient's arrival, so appropriate admitting provider can evaluate the pt.

## 2022-07-01 NOTE — Assessment & Plan Note (Addendum)
RUQ pain on exam and CT and US imaging showing pericholecystic fluid and stranding. T bili up today. No stones visualized.   Discussed with General Surgery, they recommend cholecystectomy if patient willing. Discussed with wife, given there is risk for delirium with or without surgery, and that surgery offers definitive treatment of his condition, she favors surgery. - Continue antibiotics - Consult General Surgery - Plan to go to the OR today

## 2022-07-01 NOTE — Assessment & Plan Note (Signed)
Epigastric pain and CT imaging showing some mild pancreatitis.  Lipase normal. - Bowel rest - IV fluids - IV dilaudid for pain, ondansetron for nausea

## 2022-07-01 NOTE — ED Notes (Signed)
Unable to obtain both bottles for 1st set of cultures, difficult stick. 2 attempts. Sent blue top bottle to lab.

## 2022-07-01 NOTE — Hospital Course (Addendum)
Danny [name] is a 71 y.o. M with Parkinson's disease, c/b orthostatic hypotension and hallucinations and DM who presented with acute epigastric pain.  Patient was in normal state of health until night before admission when he developed epigastric pain, malaise.  Pain persisted overnight, did not go away, and in the morning he woke up feeling like he could not breathe suddenly so wife called EMS.  In the ER he was hypotensive, WBC 13 K, lactate 2.9.  He was given fluids and the blood pressure normalized and lactate improved.  CT angiogram of the chest rule out PE.  KG normal.  CT of the abdomen and pelvis however showed cholecystitis and pancreatitis so is given antibiotics IV fluids and admitted for acute cholecystitis

## 2022-07-01 NOTE — ED Triage Notes (Signed)
Pt to ED by EMS from home. EMS was called to pts residence for a panic attack. Pt has recently experienced some family stress and loss within his family which his family believes may have triggered this. Pt also endorses reflux and taking copious amounts of tums throughout the night. Pt has a hx of parkinson's and arrives at baseline. BP is soft, however per EMS this is normal for this pt, all other VSS, NADN.

## 2022-07-01 NOTE — ED Notes (Signed)
Patient transported to CT 

## 2022-07-01 NOTE — Assessment & Plan Note (Signed)
Continue simvastatin. 

## 2022-07-01 NOTE — Assessment & Plan Note (Signed)
-   Hold metformin - SS correction insulin

## 2022-07-01 NOTE — ED Notes (Signed)
Pt up to bedside commode

## 2022-07-01 NOTE — H&P (Signed)
History and Physical    Patient: Danny [name]:[id number] DOB: 1951/08/20 DOA: 07/01/2022 DOS: the patient was seen and examined on 07/01/2022 PCP: Danny [name], [place]  Patient coming from: Home  Chief Complaint:  Chief Complaint  Patient presents with   Anxiety       HPI:  [name] is a 71 y.o. M with Parkinson's disease, c/b orthostatic hypotension and hallucinations and DM who presented with acute epigastric pain.  Patient was in normal state of health until night before admission when he developed epigastric pain, malaise.  Pain persisted overnight, did not go away, and in the morning he woke up feeling like he could not breathe suddenly so wife called EMS.  In the ER he was hypotensive, WBC 13 K, lactate 2.9.  He was given fluids and the blood pressure normalized and lactate improved.  CT angiogram of the chest rule out PE.  KG normal.  CT of the abdomen and pelvis however showed cholecystitis and pancreatitis so is given antibiotics IV fluids and admitted for acute cholecystitis      Review of Systems  Constitutional:  Negative for chills, fever and malaise/fatigue.  Respiratory:  Positive for shortness of breath (brief episode). Negative for cough and sputum production.   Cardiovascular:  Negative for chest pain.  Gastrointestinal:  Positive for abdominal pain and heartburn. Negative for blood in stool, constipation, diarrhea, melena, nausea and vomiting.  Genitourinary:  Negative for dysuria, flank pain, frequency, hematuria and urgency.  All other systems reviewed and are negative.    Past Medical History:  Diagnosis Date   Allergy    SEASONAL   Cataract    BILATERAL-REMOVED   Essential hypertension, benign    PT.DENIES ON AS PREVENTIVE 08/17/19   GERD (gastroesophageal reflux disease)    Hx of adenomatous polyp of colon 09/04/2019   08/2019 diminutive adenoma No recall given findings and age   Mixed hyperlipidemia    PT.DENIES,STATED ON AS PREVENTIVE  07/30/19   Neuromuscular disorder (HCC)    PARKINSONS   OA (osteoarthritis) of knee    right   Obesity, unspecified    Osteoarthritis    KNEE AND SHOULDERS   Parkinsons ([place])    Type II or unspecified type diabetes mellitus without mention of complication, not stated as uncontrolled    Past Surgical History:  Procedure Laterality Date   CATARACT EXTRACTION, BILATERAL     GANGLION CYST EXCISION  age 42 years   right   KNEE ARTHROSCOPY     right   KNEE ARTHROSCOPY     TOTAL KNEE ARTHROPLASTY Right 03/08/2020   Procedure: RIGHT TOTAL KNEE ARTHROPLASTY;  Surgeon: Garald Balding, MD;  Location: [facility];  Service: Orthopedics;  Laterality: Right;   WRIST FRACTURE SURGERY     WRIST SURGERY  04/2012   Social History:  reports that he quit smoking about 39 years ago. His smoking use included cigarettes. He has a 22.50 pack-year smoking history. He has never used smokeless tobacco. He reports current alcohol use. He reports that he does not use drugs.  Allergies  Allergen Reactions   Codeine Nausea And Vomiting    Family History  Problem Relation Age of Onset   Diabetes Mother    Lung cancer Mother    Cancer Father    Heart disease Father    Diabetes Sister    Diabetes Daughter    Colon polyps Son    Colon cancer Neg Hx    Esophageal cancer Neg Hx  Rectal cancer Neg Hx    Stomach cancer Neg Hx    Parkinson's disease Neg Hx     Prior to Admission medications   Medication Sig Start Date End Date Taking? Authorizing Provider  Ascorbic Acid (VITAMIN C ADULT GUMMIES PO) Take 2 tablets by mouth daily.    Yes [provider]  aspirin 81 MG EC tablet Take 81 mg by mouth daily. Swallow whole.   Yes [provider]  carbidopa-levodopa (SINEMET CR) 50-200 MG tablet TAKE 1 TABLET BY MOUTH EVERYDAY AT BEDTIME 05/29/22  Yes Athar, Eunice Blase, MD  carbidopa-levodopa (SINEMET IR) 25-100 MG tablet TAKE 1 TABLET BY MOUTH 6 (SIX) TIMES DAILY. 03/01/22  Yes [name], MD   Cinnamon 500 MG capsule Take 500 mg by mouth daily.   Yes [provider]  famotidine (PEPCID) 20 MG tablet Take 20 mg by mouth in the morning and at bedtime.    Yes [provider]  fludrocortisone (FLORINEF) 0.1 MG tablet Take 1 tablet (0.1 mg total) by mouth daily. 06/27/22  Yes [name], MD  Ginger, Zingiber officinalis, 550 MG CAPS Take 550 mg by mouth daily.   Yes [provider]  Glucosamine-Chondroit-Vit C-Mn (GLUCOSAMINE CHONDR 1500 COMPLX PO) Take 1,500 mg by mouth daily.   Yes [provider]  metFORMIN (GLUCOPHAGE) 1000 MG tablet Take 1 tablet (1,000 mg total) by mouth 2 (two) times daily with a meal. Patient taking differently: Take 1,000 mg by mouth 2 (two) times daily with a meal. Taking 500 mg each AM and 1000 mg at night. 03/11/20  Yes Petrarca, Mike Craze, PA-C  Multiple Vitamin (MULTIVITAMIN) tablet Take 1 tablet by mouth daily.   Yes [provider]  QUEtiapine (SEROQUEL) 50 MG tablet Take 50 mg by mouth at bedtime. 01/10/21  Yes [provider]  simvastatin (ZOCOR) 20 MG tablet Take 20 mg by mouth at bedtime. Takes Monday, Wednesday & Friday 06/26/22  Yes [provider]    Physical Exam: Vitals:   07/01/22 0930 07/01/22 1000 07/01/22 1335 07/01/22 1713  BP: (!) 143/82 (!) 156/89 138/88 (!) 160/85  Pulse: 70 74 65 76  Resp: '14 16 15 18  '$ Temp:    98.4 F (36.9 C)  TempSrc:    Oral  SpO2: 100% 97% 99% 96%   Adult male, elderly, lying in bed, well-groomed,Makes eye contact, no acute distress Oropharynx moist, lips normal, no oral lesions, no neck masses, trachea midline RRR, no murmurs, no peripheral edema, radial and DP pulses 2+ and symmetric, JVP normal Respiratory rate normal, lungs clear without rales or wheezes Abdomen with tenderness in the right upper quadrant, a lot of voluntary guarding there, some mild tenderness in the epigastrium, no left-sided tenderness or lower abdominal tenderness, no rigidity  or rebound Skin normal, no rashes in the face, neck, arms, chest, abdomen, back, legs Attention normal, mild right facial droop, speech somewhat slurred but at baseline per wife, bradykinesia and some cogwheeling noted, no tremor that I can appreciate, extraocular movements intact, no nystagmus, other than slight facial asymmetry, cranial nerves all normal, strength 5/5 in upper and lower extremities bilaterally Affect somewhat blunted, oriented to person, place, time, and situation       Data Reviewed: Basic metabolic panel was unremarkable, normal electrolytes and renal function Lipase normal LFTs all normal Hemogram shows leukocytosis, no anemia or thrombocytopenia Troponins normal Lactate 2.9, improved with fluids CT of the abdomen and pelvis shows cholecystitis, no other findings CT angiogram of the chest shows  no pulmonary embolism, no pneumonia, no pneumothorax, no dissection EKG personally reviewed, shows normal sinus rhythm no ST changes       Assessment and Plan: * Acute cholecystitis RUQ pain on exam and CT and US imaging showing pericholecystic fluid and stranding.  No stones visualized. - Continue antibiotics - Obtain HIDA scan - Consult General Surgery - Bowel rest - IV fluids - IV dilaudid for pain, ondansetron for nausea    Acute pancreatitis Epigastric pain and CT imaging showing some mild pancreatitis.  Lipase normal. - Bowel rest - IV fluids - IV dilaudid for pain, ondansetron for nausea  Hypotension Chronic hypotension due to Parkinson's disease - Continue Florinef  Parkinson disease ([place]) Complicated by memory loss, hypotension, hallucinations, recurrent falls, freezing, constipation and dyskinesias - Continue Sinemet - Continue Florinef   Mixed hyperlipidemia - Continue simvastatin  Diabetes mellitus type 2, uncomplicated (HCC) - Hold metformin - SS correction insulin         Advance Care Planning: DNR, discussed with wife and  patient  Consults: We will call general surgery in the morning    Family Communication: Wife at the bedside  Severity of Illness: The appropriate patient status for this patient is INPATIENT. Inpatient status is judged to be reasonable and necessary in order to provide the required intensity of service to ensure the patient's safety. The patient's presenting symptoms, physical exam findings, and initial radiographic and laboratory data in the context of their chronic comorbidities is felt to place them at high risk for further clinical deterioration. Furthermore, it is not anticipated that the patient will be medically stable for discharge from the hospital within 2 midnights of admission.   * I certify that at the point of admission it is my clinical judgment that the patient will require inpatient hospital care spanning beyond 2 midnights from the point of admission due to high intensity of service, high risk for further deterioration and high frequency of surveillance required.*  Author: Edwin Dada, MD 07/01/2022 5:50 PM  For on call review [email or web address].

## 2022-07-01 NOTE — Assessment & Plan Note (Signed)
Chronic hypotension due to Parkinson's disease - Continue Florinef

## 2022-07-01 NOTE — ED Provider Notes (Signed)
[facility] DEPARTMENT Provider Note   CSN: [id number] Arrival date & time: 07/01/22  3536     History  Chief Complaint  Patient presents with   Anxiety    RAUN ROUTH is a 71 y.o. male.  He is brought in by ambulance from home and his wife is helping assist with history.  Has a history of Parkinson's and low blood pressure.  Since last evening has been having trouble with pain in his upper abdomen feeling like heartburn.  She has been giving him some Tums without improvement.  He was up all night.  Around 5 this morning he was short of breath.  The symptoms seem to have improved.  She also feels the right side of his face is drooped.  He denies any headache numbness weakness blurry vision double vision.  No cough or current abdominal pain.  He said he has little bit of chest pain.  No shortness of breath.  No diarrhea or constipation no urinary symptoms.  He has fairly advanced Parkinson's and uses a walker, no recent falls  The history is provided by the patient.  Abdominal Pain Pain location:  Epigastric Pain radiates to:  Chest Pain severity:  Moderate Onset quality:  Gradual Duration:  12 hours Timing:  Constant Progression:  Improving Chronicity:  New Context: not trauma   Relieved by:  Nothing Worsened by:  Nothing Ineffective treatments:  Antacids Associated symptoms: chest pain and shortness of breath   Associated symptoms: no constipation, no cough, no diarrhea, no dysuria, no fever, no nausea and no vomiting   Risk factors: aspirin        Home Medications Prior to Admission medications   Medication Sig Start Date End Date Taking? Authorizing Provider  Ascorbic Acid (VITAMIN C ADULT GUMMIES PO) Take 2 tablets by mouth daily.     [provider]  aspirin 81 MG EC tablet Take 81 mg by mouth daily. Swallow whole.    [provider]  carbidopa-levodopa (SINEMET CR) 50-200 MG tablet TAKE 1 TABLET BY MOUTH EVERYDAY AT BEDTIME  05/29/22   [name], MD  carbidopa-levodopa (SINEMET IR) 25-100 MG tablet TAKE 1 TABLET BY MOUTH 6 (SIX) TIMES DAILY. 03/01/22   [name], MD  Cinnamon 500 MG capsule Take 500 mg by mouth daily.    [provider]  famotidine (PEPCID) 20 MG tablet Take 20 mg by mouth in the morning and at bedtime.     [provider]  fludrocortisone (FLORINEF) 0.1 MG tablet Take 1 tablet (0.1 mg total) by mouth daily. 06/27/22   [name], MD  Ginger, Zingiber officinalis, 550 MG CAPS Take 550 mg by mouth daily.    [provider]  Glucosamine-Chondroit-Vit C-Mn (GLUCOSAMINE CHONDR 1500 COMPLX PO) Take 1,500 mg by mouth daily.    [provider]  metFORMIN (GLUCOPHAGE) 1000 MG tablet Take 1 tablet (1,000 mg total) by mouth 2 (two) times daily with a meal. Patient taking differently: Take 1,000 mg by mouth 2 (two) times daily with a meal. Taking 500 mg each AM and 1000 mg at night. 03/11/20   Petrarca, Mike Craze, PA-C  Multiple Vitamin (MULTIVITAMIN) tablet Take 1 tablet by mouth daily.    [provider]  QUEtiapine (SEROQUEL) 50 MG tablet Take 50 mg by mouth at bedtime. 01/10/21   [provider]      Allergies    Codeine    Review of Systems   Review of Systems  Constitutional:  Negative for fever.  Eyes:  Negative for visual disturbance.  Respiratory:  Positive for shortness of breath. Negative for cough.   Cardiovascular:  Positive for chest pain.  Gastrointestinal:  Positive for abdominal pain. Negative for constipation, diarrhea, nausea and vomiting.  Genitourinary:  Negative for dysuria.  Musculoskeletal:  Negative for neck pain.  Skin:  Negative for rash.  Neurological:  Negative for headaches.    Physical Exam Updated Vital Signs BP (!) 89/67   Pulse 79   Temp 97.9 F (36.6 C)   Resp 14   SpO2 97%  Physical Exam Vitals and nursing note reviewed.  Constitutional:      General: He is not in acute distress.    Appearance: Normal  appearance. He is well-developed.  HENT:     Head: Normocephalic and atraumatic.  Eyes:     Conjunctiva/sclera: Conjunctivae normal.  Cardiovascular:     Rate and Rhythm: Normal rate and regular rhythm.     Heart sounds: No murmur heard. Pulmonary:     Effort: Pulmonary effort is normal. No respiratory distress.     Breath sounds: Normal breath sounds.  Abdominal:     Palpations: Abdomen is soft.     Tenderness: There is no abdominal tenderness. There is no guarding or rebound.  Musculoskeletal:        General: No swelling.     Cervical back: Neck supple.  Skin:    General: Skin is warm and dry.     Capillary Refill: Capillary refill takes less than 2 seconds.  Neurological:     Mental Status: He is alert.     Comments: He has a slight right facial droop at rest although it is symmetric with formal cranial nerve testing.  He has symmetric strength in his upper and lower extremities diminished throughout.  Normal sensation.  Psychiatric:        Mood and Affect: Mood normal.     ED Results / Procedures / Treatments   Labs (all labs ordered are listed, but only abnormal results are displayed) Labs Reviewed  COMPREHENSIVE METABOLIC PANEL - Abnormal; Notable for the following components:      Result Value   Glucose, Bld 140 (*)    All other components within normal limits  CBC WITH DIFFERENTIAL/PLATELET - Abnormal; Notable for the following components:   WBC 13.7 (*)    RBC 4.21 (*)    Neutro Abs 11.2 (*)    All other components within normal limits  LACTIC ACID, PLASMA - Abnormal; Notable for the following components:   Lactic Acid, Venous 2.9 (*)    All other components within normal limits  LACTIC ACID, PLASMA - Abnormal; Notable for the following components:   Lactic Acid, Venous 2.5 (*)    All other components within normal limits  LACTIC ACID, PLASMA - Abnormal; Notable for the following components:   Lactic Acid, Venous 2.4 (*)    All other components within normal  limits  CULTURE, BLOOD (ROUTINE X 2)  CULTURE, BLOOD (ROUTINE X 2)  LIPASE, BLOOD  URINALYSIS, ROUTINE W REFLEX MICROSCOPIC  HIV ANTIBODY (ROUTINE TESTING W REFLEX)  COMPREHENSIVE METABOLIC PANEL  CBC  TROPONIN I (HIGH SENSITIVITY)  TROPONIN I (HIGH SENSITIVITY)    EKG EKG Interpretation  Date/Time:  Sunday July 01 2022 06:49:25 EDT Ventricular Rate:  81 PR Interval:  127 QRS Duration: 91 QT Interval:  365 QTC Calculation: 424 R Axis:   1 Text Interpretation: Sinus rhythm Abnormal R-wave progression, early transition No significant  change since prior 11/22 Confirmed by Aletta Edouard [phone]) on 07/01/2022 9:06:17 AM  Radiology US Abdomen Limited RUQ (LIVER/GB)  Result Date: 07/01/2022 CLINICAL DATA:  71 year old male with history of right upper quadrant abdominal pain. Abnormal appearance of the gallbladder on recent CT examination. Follow-up study. Abdominal ultrasound no prior right upper quadrant ultrasound. CT the abdomen and pelvis 07/01/2022. EXAM: ULTRASOUND ABDOMEN LIMITED RIGHT UPPER QUADRANT COMPARISON:  None Available. FINDINGS: Gallbladder: No definite gallstones. Gallbladder is only moderately distended. Gallbladder wall appears thickened and edematous measuring up to 5 mm. Trace amount of pericholecystic fluid. Per report from the sonographer, there was no sonographic Murphy's sign on examination. Common bile duct: Diameter: 2 mm Liver: No focal lesion identified. Within normal limits in parenchymal echogenicity. Portal vein is patent on color Doppler imaging with normal direction of blood flow towards the liver. Other: None. IMPRESSION: 1. Gallbladder wall appears thickened and edematous, and there is a trace amount of pericholecystic fluid. However, there are no gallstones, and there was no sonographic Murphy's sign on examination. Overall, findings are considered equivocal, but not strongly suggestive of acute cholecystitis in the absence of a sonographic Murphy's sign. If  there is clinical concern for acute acalculous cholecystitis, further evaluation with nuclear medicine hepatobiliary scan could be considered. Electronically Signed   By: Vinnie Langton M.D.   On: 07/01/2022 11:34   CT Abdomen Pelvis W Contrast  Result Date: 07/01/2022 CLINICAL DATA:  Pulmonary embolism (PE) suspected, high prob; Sepsis EXAM: CT ANGIOGRAPHY CHEST CT ABDOMEN AND PELVIS WITH CONTRAST TECHNIQUE: Multidetector CT imaging of the chest was performed using the standard protocol during bolus administration of intravenous contrast. Multiplanar CT image reconstructions and MIPs were obtained to evaluate the vascular anatomy. Multidetector CT imaging of the abdomen and pelvis was performed using the standard protocol during bolus administration of intravenous contrast. RADIATION DOSE REDUCTION: This exam was performed according to the departmental dose-optimization program which includes automated exposure control, adjustment of the mA and/or kV according to patient size and/or use of iterative reconstruction technique. CONTRAST:  19m OMNIPAQUE IOHEXOL 350 MG/ML SOLN COMPARISON:  None Available. FINDINGS: CTA CHEST FINDINGS Cardiovascular: Satisfactory opacification of the pulmonary arteries to the segmental level. No evidence of pulmonary embolism. Thoracic aorta is normal in course and caliber. Scattered atherosclerotic calcifications of the aorta and coronary arteries. Normal heart size. No pericardial effusion. Mediastinum/Nodes: No enlarged mediastinal, hilar, or axillary lymph nodes. Thyroid gland, trachea, and esophagus demonstrate no significant findings. Lungs/Pleura: Mild bibasilar subsegmental atelectasis. Lungs are otherwise clear. No pleural effusion or pneumothorax. Musculoskeletal: No chest wall abnormality. No acute or significant osseous findings. Review of the MIP images confirms the above findings. CT ABDOMEN and PELVIS FINDINGS Hepatobiliary: Unremarkable appearance of the liver. No  focal liver abnormality. Mild-moderate gallbladder distension with diffuse gallbladder wall thickening and pericholecystic fluid/stranding. No intrahepatic biliary dilatation Pancreas: Edematous appearance of the pancreatic body and tail with mild peripancreatic fat stranding and trace free fluid. No organized peripancreatic fluid collection. There is a short segment of mild pancreatic ductal dilatation within the pancreatic tail. Pancreatic tail is somewhat truncated. Spleen: Normal in size without focal abnormality. Adrenals/Urinary Tract: Unremarkable adrenal glands. Two adjacent simple cysts within the upper pole the left kidney which do not require follow-up. Kidneys appear otherwise unremarkable with symmetric enhancement. No renal stone or hydronephrosis. Urinary bladder is within normal limits. Stomach/Bowel: Stomach is within normal limits. Appendix appears normal (series 2, image 61). Colon is somewhat redundant. No evidence of bowel wall thickening, distention,  or inflammatory changes. Moderate volume of stool within the colon Vascular/Lymphatic: Scattered aortoiliac atherosclerotic calcifications without aneurysm. No abdominopelvic lymphadenopathy. Slightly increased attenuation within the central mesentery with several small mesenteric lymph nodes. Reproductive: Prostate is unremarkable. Other: No ascites. No abdominopelvic fluid collection. No pneumoperitoneum. No abdominal wall hernia. Musculoskeletal: No acute or significant osseous findings. Several old healed right-sided rib fractures. Review of the MIP images confirms the above findings. IMPRESSION: 1. No evidence of pulmonary embolism or other acute cardiopulmonary process. 2. Mild-moderate gallbladder distension with diffuse gallbladder wall thickening and pericholecystic fluid/stranding, suggestive of acute cholecystitis. Further evaluation with right upper quadrant ultrasound is recommended. 3. Appearance of the pancreas suggests mild acute  uncomplicated pancreatitis. Correlate with serum lipase. There is a short segment of mild pancreatic ductal dilatation within the pancreatic tail. 4. Slightly increased attenuation within the central mesentery with several small mesenteric lymph nodes, which can be seen in the setting of mesenteric panniculitis. 5. Moderate volume of stool within the colon. 6. Aortic and coronary artery atherosclerosis (ICD10-I70.0). Electronically Signed   By: Davina Poke D.O.   On: 07/01/2022 09:51   CT Angio Chest PE W/Cm &/Or Wo Cm  Result Date: 07/01/2022 CLINICAL DATA:  Pulmonary embolism (PE) suspected, high prob; Sepsis EXAM: CT ANGIOGRAPHY CHEST CT ABDOMEN AND PELVIS WITH CONTRAST TECHNIQUE: Multidetector CT imaging of the chest was performed using the standard protocol during bolus administration of intravenous contrast. Multiplanar CT image reconstructions and MIPs were obtained to evaluate the vascular anatomy. Multidetector CT imaging of the abdomen and pelvis was performed using the standard protocol during bolus administration of intravenous contrast. RADIATION DOSE REDUCTION: This exam was performed according to the departmental dose-optimization program which includes automated exposure control, adjustment of the mA and/or kV according to patient size and/or use of iterative reconstruction technique. CONTRAST:  131m OMNIPAQUE IOHEXOL 350 MG/ML SOLN COMPARISON:  None Available. FINDINGS: CTA CHEST FINDINGS Cardiovascular: Satisfactory opacification of the pulmonary arteries to the segmental level. No evidence of pulmonary embolism. Thoracic aorta is normal in course and caliber. Scattered atherosclerotic calcifications of the aorta and coronary arteries. Normal heart size. No pericardial effusion. Mediastinum/Nodes: No enlarged mediastinal, hilar, or axillary lymph nodes. Thyroid gland, trachea, and esophagus demonstrate no significant findings. Lungs/Pleura: Mild bibasilar subsegmental atelectasis. Lungs  are otherwise clear. No pleural effusion or pneumothorax. Musculoskeletal: No chest wall abnormality. No acute or significant osseous findings. Review of the MIP images confirms the above findings. CT ABDOMEN and PELVIS FINDINGS Hepatobiliary: Unremarkable appearance of the liver. No focal liver abnormality. Mild-moderate gallbladder distension with diffuse gallbladder wall thickening and pericholecystic fluid/stranding. No intrahepatic biliary dilatation Pancreas: Edematous appearance of the pancreatic body and tail with mild peripancreatic fat stranding and trace free fluid. No organized peripancreatic fluid collection. There is a short segment of mild pancreatic ductal dilatation within the pancreatic tail. Pancreatic tail is somewhat truncated. Spleen: Normal in size without focal abnormality. Adrenals/Urinary Tract: Unremarkable adrenal glands. Two adjacent simple cysts within the upper pole the left kidney which do not require follow-up. Kidneys appear otherwise unremarkable with symmetric enhancement. No renal stone or hydronephrosis. Urinary bladder is within normal limits. Stomach/Bowel: Stomach is within normal limits. Appendix appears normal (series 2, image 61). Colon is somewhat redundant. No evidence of bowel wall thickening, distention, or inflammatory changes. Moderate volume of stool within the colon Vascular/Lymphatic: Scattered aortoiliac atherosclerotic calcifications without aneurysm. No abdominopelvic lymphadenopathy. Slightly increased attenuation within the central mesentery with several small mesenteric lymph nodes. Reproductive: Prostate is unremarkable. Other: No  ascites. No abdominopelvic fluid collection. No pneumoperitoneum. No abdominal wall hernia. Musculoskeletal: No acute or significant osseous findings. Several old healed right-sided rib fractures. Review of the MIP images confirms the above findings. IMPRESSION: 1. No evidence of pulmonary embolism or other acute cardiopulmonary  process. 2. Mild-moderate gallbladder distension with diffuse gallbladder wall thickening and pericholecystic fluid/stranding, suggestive of acute cholecystitis. Further evaluation with right upper quadrant ultrasound is recommended. 3. Appearance of the pancreas suggests mild acute uncomplicated pancreatitis. Correlate with serum lipase. There is a short segment of mild pancreatic ductal dilatation within the pancreatic tail. 4. Slightly increased attenuation within the central mesentery with several small mesenteric lymph nodes, which can be seen in the setting of mesenteric panniculitis. 5. Moderate volume of stool within the colon. 6. Aortic and coronary artery atherosclerosis (ICD10-I70.0). Electronically Signed   By: Davina Poke D.O.   On: 07/01/2022 09:51   CT Head Wo Contrast  Result Date: 07/01/2022 CLINICAL DATA:  Acute neurologic deficit.  Suspected stroke. EXAM: CT HEAD WITHOUT CONTRAST TECHNIQUE: Contiguous axial images were obtained from the base of the skull through the vertex without intravenous contrast. RADIATION DOSE REDUCTION: This exam was performed according to the departmental dose-optimization program which includes automated exposure control, adjustment of the mA and/or kV according to patient size and/or use of iterative reconstruction technique. COMPARISON:  11/08/2021 FINDINGS: Brain: No evidence of intracranial hemorrhage, acute infarction, hydrocephalus, extra-axial collection, or mass lesion/mass effect. Mild diffuse cerebral atrophy appears stable since previous study. Vascular:  No hyperdense vessel or other acute findings. Skull: No evidence of fracture or other significant bone abnormality. Sinuses/Orbits: No acute findings. Old left orbital floor fracture again noted. Other: None. IMPRESSION: No acute intracranial abnormality. Stable mild cerebral atrophy. Electronically Signed   By: Marlaine Hind M.D.   On: 07/01/2022 09:36   DG Chest Port 1 View  Result Date:  07/01/2022 CLINICAL DATA:  71 year old male with history of chest pain. EXAM: PORTABLE CHEST 1 VIEW COMPARISON:  Chest x-ray 05/19/2020. FINDINGS: Lung volumes are normal. No consolidative airspace disease. No pleural effusions. No pneumothorax. No pulmonary nodule or mass noted. Pulmonary vasculature and the cardiomediastinal silhouette are within normal limits. IMPRESSION: No radiographic evidence of acute cardiopulmonary disease. Electronically Signed   By: Vinnie Langton M.D.   On: 07/01/2022 07:37    Procedures .Critical Care  Performed by: Hayden Rasmussen, MD Authorized by: Hayden Rasmussen, MD   Critical care provider statement:    Critical care time (minutes):  45   Critical care time was exclusive of:  Separately billable procedures and treating other patients   Critical care was necessary to treat or prevent imminent or life-threatening deterioration of the following conditions:  Sepsis   Critical care was time spent personally by me on the following activities:  Development of treatment plan with patient or surrogate, discussions with consultants, evaluation of patient's response to treatment, examination of patient, obtaining history from patient or surrogate, ordering and performing treatments and interventions, ordering and review of laboratory studies, ordering and review of radiographic studies, pulse oximetry, re-evaluation of patient's condition and review of old charts   I assumed direction of critical care for this patient from another provider in my specialty: no       Medications Ordered in ED Medications  carbidopa-levodopa (SINEMET CR) 50-200 MG per tablet controlled release 1 tablet (has no administration in time range)  carbidopa-levodopa (SINEMET IR) 25-100 MG per tablet immediate release 1 tablet (1 tablet Oral Not Given 07/01/22  1300)  aspirin EC tablet 81 mg (81 mg Oral Given 07/01/22 1547)  famotidine (PEPCID) tablet 20 mg (20 mg Oral Given 07/01/22 1547)   QUEtiapine (SEROQUEL) tablet 50 mg (has no administration in time range)  simvastatin (ZOCOR) tablet 20 mg (has no administration in time range)  piperacillin-tazobactam (ZOSYN) IVPB 3.375 g (has no administration in time range)  fludrocortisone (FLORINEF) tablet 0.1 mg (has no administration in time range)  enoxaparin (LOVENOX) injection 40 mg (has no administration in time range)  0.9 % NaCl with KCl 20 mEq/ L  infusion (has no administration in time range)  acetaminophen (TYLENOL) tablet 650 mg (has no administration in time range)    Or  acetaminophen (TYLENOL) suppository 650 mg (has no administration in time range)  HYDROmorphone (DILAUDID) injection 0.5-1 mg (has no administration in time range)  ondansetron (ZOFRAN) tablet 4 mg (has no administration in time range)    Or  ondansetron (ZOFRAN) injection 4 mg (has no administration in time range)  sodium chloride 0.9 % bolus 500 mL ( Intravenous Stopped 07/01/22 0828)  iohexol (OMNIPAQUE) 350 MG/ML injection 100 mL (100 mLs Intravenous Contrast Given 07/01/22 0846)  piperacillin-tazobactam (ZOSYN) IVPB 3.375 g (0 g Intravenous Stopped 07/01/22 1039)  sodium chloride 0.9 % bolus 500 mL ( Intravenous Stopped 07/01/22 1127)    ED Course/ Medical Decision Making/ A&P Clinical Course as of 07/01/22 1740  Sun Jul 01, 2022  0745 Chest x-ray interpreted by me as no acute infiltrates.  Awaiting radiology reading. [MB]  1203 Discussed with Dr. Olevia Bowens [facility] hospitalist who will put the patient in for a bed for admission for further work-up of patient's symptoms. [MB]    Clinical Course User Index [MB] Hayden Rasmussen, MD                           Medical Decision Making Amount and/or Complexity of Data Reviewed Labs: ordered. Radiology: ordered.  Risk Prescription drug management. Decision regarding hospitalization.  This patient complains of abdomen and chest pain, shortness of breath, nausea; this involves an extensive number of  treatment Options and is a complaint that carries with it a high risk of complications and morbidity. The differential includes ACS, pneumonia, pneumothorax, perforation, cholelithiasis, cholecystitis, peptic ulcer disease, PE  I ordered, reviewed and interpreted labs, which included CBC with mildly elevated white count stable hemoglobin, chemistries normal other than elevated glucose, LFTs and lipase normal, lactate elevated and trending down, blood culture sent, urinalysis without signs of infection, troponins normal I ordered medication IV fluids IV antibiotics IV acid medication and reviewed PMP when indicated. I ordered imaging studies which included chest x-ray, CT head chest abdomen and pelvis, ultrasound and I independently    visualized and interpreted imaging which showed cholelithiasis possible acute cholecystitis Additional history obtained from patient's wife Previous records obtained and reviewed in epic including recent ED visits I consulted [facility] hospitalist Dr. Olevia Bowens and discussed lab and imaging findings and discussed disposition.  Cardiac monitoring reviewed, normal sinus rhythm Social determinants considered, patient physically inactive Critical Interventions: Initiation of IV antibiotics and fluids for SIRS and elevated lactate  After the interventions stated above, I reevaluated the patient and found patient to be hemodynamically improving Admission and further testing considered, he would benefit from [facility] for further work-up of possible acute cholecystitis.          Final Clinical Impression(s) / ED Diagnoses Final diagnoses:  Calculus of gallbladder without cholecystitis without obstruction  SIRS (systemic inflammatory response syndrome) ([place])    Rx / DC Orders ED Discharge Orders     None         Hayden Rasmussen, MD 07/01/22 1744

## 2022-07-01 NOTE — ED Notes (Signed)
EDP Melina Copa agreeable to pt receiving clear liquids at this time. Pt provided chicken broth and diet ginger ale, per his request.  No further needs expressed. Wife at bedside, call bell within reach, will continue to monitor.

## 2022-07-01 NOTE — Assessment & Plan Note (Addendum)
Complicated bymemory loss, hypotension, hallucinations, recurrent falls,freezing,constipationand dyskinesias - Continue Sinemet - Continue Florinef

## 2022-07-02 ENCOUNTER — Inpatient Hospital Stay (HOSPITAL_COMMUNITY): Payer: Medicare Other

## 2022-07-02 ENCOUNTER — Inpatient Hospital Stay (HOSPITAL_COMMUNITY): Payer: Medicare Other | Admitting: Anesthesiology

## 2022-07-02 ENCOUNTER — Encounter (HOSPITAL_COMMUNITY): Payer: Self-pay | Admitting: *Deleted

## 2022-07-02 ENCOUNTER — Encounter (HOSPITAL_COMMUNITY)
Admission: EM | Disposition: A | Discharge status: Home Health | Payer: Self-pay | Source: Home / Self Care | Attending: Family Medicine

## 2022-07-02 DIAGNOSIS — Z87891 Personal history of nicotine dependence: Secondary | ICD-10-CM

## 2022-07-02 DIAGNOSIS — Z7984 Long term (current) use of oral hypoglycemic drugs: Secondary | ICD-10-CM

## 2022-07-02 DIAGNOSIS — G903 Multi-system degeneration of the autonomic nervous system: Secondary | ICD-10-CM | POA: Diagnosis not present

## 2022-07-02 DIAGNOSIS — K859 Acute pancreatitis without necrosis or infection, unspecified: Secondary | ICD-10-CM | POA: Diagnosis not present

## 2022-07-02 DIAGNOSIS — I10 Essential (primary) hypertension: Secondary | ICD-10-CM

## 2022-07-02 DIAGNOSIS — K810 Acute cholecystitis: Secondary | ICD-10-CM

## 2022-07-02 DIAGNOSIS — E119 Type 2 diabetes mellitus without complications: Secondary | ICD-10-CM | POA: Diagnosis not present

## 2022-07-02 HISTORY — PX: CHOLECYSTECTOMY: SHX55

## 2022-07-02 LAB — COMPREHENSIVE METABOLIC PANEL
ALT: 5 U/L (ref 0–44)
AST: 12 U/L — ABNORMAL LOW (ref 15–41)
Albumin: 3.2 g/dL — ABNORMAL LOW (ref 3.5–5.0)
Alkaline Phosphatase: 64 U/L (ref 38–126)
Anion gap: 6 (ref 5–15)
BUN: 13 mg/dL (ref 8–23)
CO2: 26 mmol/L (ref 22–32)
Calcium: 9 mg/dL (ref 8.9–10.3)
Chloride: 107 mmol/L (ref 98–111)
Creatinine, Ser: 0.84 mg/dL (ref 0.61–1.24)
GFR, Estimated: >60 mL/min (ref >60)
Glucose, Bld: 136 mg/dL — ABNORMAL HIGH (ref 70–99)
Potassium: 3.6 mmol/L (ref 3.5–5.1)
Sodium: 139 mmol/L (ref 135–145)
Total Bilirubin: 2.1 mg/dL — ABNORMAL HIGH (ref 0.3–1.2)
Total Protein: 6.2 g/dL — ABNORMAL LOW (ref 6.5–8.1)

## 2022-07-02 LAB — GLUCOSE, CAPILLARY
Glucose-Capillary: 136 mg/dL — ABNORMAL HIGH (ref 70–99)
Glucose-Capillary: 160 mg/dL — ABNORMAL HIGH (ref 70–99)
Glucose-Capillary: 197 mg/dL — ABNORMAL HIGH (ref 70–99)
Glucose-Capillary: 190 mg/dL — ABNORMAL HIGH (ref 70–99)

## 2022-07-02 LAB — CBC
HCT: 38.1 % — ABNORMAL LOW (ref 39.0–52.0)
Hemoglobin: 13.1 g/dL (ref 13.0–17.0)
MCH: 32.2 pg (ref 26.0–34.0)
MCHC: 34.4 g/dL (ref 30.0–36.0)
MCV: 93.6 fL (ref 80.0–100.0)
Platelets: 169 10*3/uL (ref 150–400)
RBC: 4.07 MIL/uL — ABNORMAL LOW (ref 4.22–5.81)
RDW: 14.3 % (ref 11.5–15.5)
WBC: 10.6 10*3/uL — ABNORMAL HIGH (ref 4.0–10.5)
nRBC: 0 % (ref 0.0–0.2)

## 2022-07-02 LAB — HIV ANTIBODY (ROUTINE TESTING W REFLEX): HIV Screen 4th Generation wRfx: NONREACTIVE

## 2022-07-02 SURGERY — LAPAROSCOPIC CHOLECYSTECTOMY WITH INTRAOPERATIVE CHOLANGIOGRAM
Anesthesia: General | Site: Abdomen

## 2022-07-02 MED ORDER — PROPOFOL 10 MG/ML IV BOLUS
INTRAVENOUS | Status: AC
  Filled 2022-07-02: qty 20

## 2022-07-02 MED ORDER — LACTATED RINGERS IV SOLN
INTRAVENOUS | Status: DC
Start: 2022-07-02 — End: 2022-07-02

## 2022-07-02 MED ORDER — PROPOFOL 500 MG/50ML IV EMUL
INTRAVENOUS | Status: DC | PRN
  Administered 2022-07-02: 50 ug/kg/min via INTRAVENOUS

## 2022-07-02 MED ORDER — TRAMADOL HCL 50 MG PO TABS: 50.0000 mg | ORAL_TABLET | Freq: Four times a day (QID) | ORAL | Status: DC | PRN

## 2022-07-02 MED ORDER — ORAL CARE MOUTH RINSE: 15.0000 mL | Freq: Once | OROMUCOSAL | Status: AC

## 2022-07-02 MED ORDER — LABETALOL HCL 5 MG/ML IV SOLN
INTRAVENOUS | Status: AC
  Filled 2022-07-02: qty 4

## 2022-07-02 MED ORDER — ONDANSETRON HCL 4 MG/2ML IJ SOLN: 4.0000 mg | Freq: Once | INTRAMUSCULAR | Status: DC | PRN

## 2022-07-02 MED ORDER — SPY AGENT GREEN - (INDOCYANINE FOR INJECTION)
INTRAMUSCULAR | Status: DC | PRN
  Administered 2022-07-02: 2 mL via INTRAVENOUS

## 2022-07-02 MED ORDER — PROPOFOL 500 MG/50ML IV EMUL
INTRAVENOUS | Status: AC
  Filled 2022-07-02: qty 50

## 2022-07-02 MED ORDER — FENTANYL CITRATE (PF) 250 MCG/5ML IJ SOLN
INTRAMUSCULAR | Status: DC | PRN
  Administered 2022-07-02 (×3): 50 ug via INTRAVENOUS
  Administered 2022-07-02 (×2): 100 ug via INTRAVENOUS

## 2022-07-02 MED ORDER — LACTATED RINGERS IR SOLN
Status: DC | PRN
  Administered 2022-07-02: 1000 mL

## 2022-07-02 MED ORDER — FENTANYL CITRATE (PF) 250 MCG/5ML IJ SOLN
INTRAMUSCULAR | Status: AC
  Filled 2022-07-02: qty 5

## 2022-07-02 MED ORDER — CHLORHEXIDINE GLUCONATE 0.12 % MT SOLN
15.0000 mL | Freq: Once | OROMUCOSAL | Status: AC
  Administered 2022-07-02: 15 mL via OROMUCOSAL

## 2022-07-02 MED ORDER — FENTANYL CITRATE PF 50 MCG/ML IJ SOSY: 25.0000 ug | PREFILLED_SYRINGE | INTRAMUSCULAR | Status: DC | PRN

## 2022-07-02 MED ORDER — BUPIVACAINE HCL (PF) 0.5 % IJ SOLN
INTRAMUSCULAR | Status: AC
  Filled 2022-07-02: qty 30

## 2022-07-02 MED ORDER — ONDANSETRON HCL 4 MG/2ML IJ SOLN
INTRAMUSCULAR | Status: DC | PRN
  Administered 2022-07-02: 4 mg via INTRAVENOUS

## 2022-07-02 MED ORDER — PHENYLEPHRINE 80 MCG/ML (10ML) SYRINGE FOR IV PUSH (FOR BLOOD PRESSURE SUPPORT)
PREFILLED_SYRINGE | INTRAVENOUS | Status: DC | PRN
  Administered 2022-07-02: 80 ug via INTRAVENOUS

## 2022-07-02 MED ORDER — 0.9 % SODIUM CHLORIDE (POUR BTL) OPTIME
TOPICAL | Status: DC | PRN
  Administered 2022-07-02: 1000 mL

## 2022-07-02 MED ORDER — IOHEXOL 300 MG/ML  SOLN
INTRAMUSCULAR | Status: DC | PRN
  Administered 2022-07-02: 12 mL

## 2022-07-02 MED ORDER — ROCURONIUM BROMIDE 10 MG/ML (PF) SYRINGE
PREFILLED_SYRINGE | INTRAVENOUS | Status: DC | PRN
  Administered 2022-07-02: 40 mg via INTRAVENOUS

## 2022-07-02 MED ORDER — ROCURONIUM BROMIDE 10 MG/ML (PF) SYRINGE
PREFILLED_SYRINGE | INTRAVENOUS | Status: AC
  Filled 2022-07-02: qty 10

## 2022-07-02 MED ORDER — PROPOFOL 10 MG/ML IV BOLUS
INTRAVENOUS | Status: DC | PRN
  Administered 2022-07-02: 30 mg via INTRAVENOUS
  Administered 2022-07-02: 120 mg via INTRAVENOUS

## 2022-07-02 MED ORDER — DEXAMETHASONE SODIUM PHOSPHATE 10 MG/ML IJ SOLN
INTRAMUSCULAR | Status: DC | PRN
  Administered 2022-07-02: 5 mg via INTRAVENOUS

## 2022-07-02 MED ORDER — HYDROMORPHONE HCL 1 MG/ML IJ SOLN: 0.5000 mg | INTRAMUSCULAR | Status: DC | PRN

## 2022-07-02 MED ORDER — LIDOCAINE HCL (CARDIAC) PF 100 MG/5ML IV SOSY
PREFILLED_SYRINGE | INTRAVENOUS | Status: DC | PRN
  Administered 2022-07-02: 60 mg via INTRAVENOUS

## 2022-07-02 MED ORDER — BUPIVACAINE HCL (PF) 0.5 % IJ SOLN
INTRAMUSCULAR | Status: DC | PRN
  Administered 2022-07-02: 20 mL

## 2022-07-02 MED ORDER — SUGAMMADEX SODIUM 200 MG/2ML IV SOLN
INTRAVENOUS | Status: DC | PRN
  Administered 2022-07-02: 160 mg via INTRAVENOUS

## 2022-07-02 MED ORDER — LIDOCAINE HCL (PF) 2 % IJ SOLN
INTRAMUSCULAR | Status: AC
  Filled 2022-07-02: qty 5

## 2022-07-02 SURGICAL SUPPLY — 36 items
ADH SKN CLS APL DERMABOND .7 (GAUZE/BANDAGES/DRESSINGS) ×1
APL PRP STRL LF DISP 70% ISPRP (MISCELLANEOUS) ×1
APPLIER CLIP 5 13 M/L LIGAMAX5 (MISCELLANEOUS) ×2
APR CLP MED LRG 5 ANG JAW (MISCELLANEOUS) ×1
BAG COUNTER SPONGE SURGICOUNT (BAG) IMPLANT
BAG SPNG CNTER NS LX DISP (BAG)
CABLE HIGH FREQUENCY MONO STRZ (ELECTRODE) ×3 IMPLANT
CHLORAPREP W/TINT 26 (MISCELLANEOUS) ×3 IMPLANT
CLIP APPLIE 5 13 M/L LIGAMAX5 (MISCELLANEOUS) ×2 IMPLANT
COVER MAYO STAND XLG (MISCELLANEOUS) ×3 IMPLANT
DERMABOND ADVANCED (GAUZE/BANDAGES/DRESSINGS) ×1
DERMABOND ADVANCED .7 DNX12 (GAUZE/BANDAGES/DRESSINGS) ×2 IMPLANT
DRAPE C-ARM 42X120 X-RAY (DRAPES) IMPLANT
ELECT REM PT RETURN 15FT ADLT (MISCELLANEOUS) ×3 IMPLANT
GLOVE BIO SURGEON STRL SZ7.5 (GLOVE) ×3 IMPLANT
GOWN STRL REUS W/ TWL XL LVL3 (GOWN DISPOSABLE) ×4 IMPLANT
GOWN STRL REUS W/TWL XL LVL3 (GOWN DISPOSABLE) ×4
HEMOSTAT SURGICEL 4X8 (HEMOSTASIS) IMPLANT
IRRIG SUCT STRYKERFLOW 2 WTIP (MISCELLANEOUS) ×2
IRRIGATION SUCT STRKRFLW 2 WTP (MISCELLANEOUS) ×2 IMPLANT
KIT BASIN OR (CUSTOM PROCEDURE TRAY) ×3 IMPLANT
KIT TURNOVER KIT A (KITS) IMPLANT
PENCIL SMOKE EVACUATOR (MISCELLANEOUS) IMPLANT
SCISSORS LAP 5X35 DISP (ENDOMECHANICALS) ×3 IMPLANT
SET CHOLANGIOGRAPH MIX (MISCELLANEOUS) IMPLANT
SET TUBE SMOKE EVAC HIGH FLOW (TUBING) ×3 IMPLANT
SLEEVE Z-THREAD 5X100MM (TROCAR) ×6 IMPLANT
SPIKE FLUID TRANSFER (MISCELLANEOUS) ×3 IMPLANT
SUT MNCRL AB 4-0 PS2 18 (SUTURE) ×3 IMPLANT
SYS BAG RETRIEVAL 10MM (BASKET) ×2
SYSTEM BAG RETRIEVAL 10MM (BASKET) ×2 IMPLANT
TOWEL OR 17X26 10 PK STRL BLUE (TOWEL DISPOSABLE) ×3 IMPLANT
TOWEL OR NON WOVEN STRL DISP B (DISPOSABLE) ×3 IMPLANT
TRAY LAPAROSCOPIC (CUSTOM PROCEDURE TRAY) ×3 IMPLANT
TROCAR BALLN 12MMX100 BLUNT (TROCAR) ×3 IMPLANT
TROCAR Z-THREAD OPTICAL 5X100M (TROCAR) ×3 IMPLANT

## 2022-07-02 NOTE — Op Note (Signed)
   HARDIN HARDENBROOK 07/01/2022 - 07/02/2022   Pre-op Diagnosis: ACUTE CHOLECYSTITIS     Post-op Diagnosis: same  Procedure(s): LAPAROSCOPIC CHOLECYSTECTOMY WITH INTRAOPERATIVE CHOLANGIOGRAM  Surgeon(s): Coralie Keens, MD  Assist: Dr. Clerance Lav, [facility] Resident  Anesthesia: General  Staff:  Circulator: Gillis Santa, RN Radiology Technologist: Desma Mcgregor, RT Scrub Person: Cleon Dew  Estimated Blood Loss: Minimal               Specimens: sent to path         Findings: The patient had an acutely inflamed gallbladder.  Cholangiogram was normal obvious obstruction  Procedure: The patient was brought to operating identifies correct patient.  He is placed upon the operating table general anesthesia was induced.  His abdomen was prepped and draped in the usual sterile fashion.  I made a small vertical incision below the umbilicus.  This was then carried down to the fascia was then opened the scalpel.  Hemostat was then used to pass to the peritoneal cavity.  A 0 Vicryl pursing suture was placed around the fascial opening.  The [facility] port was placed the opening and insufflation of the abdomen was begun.  A 5 mm trocar was placed in the patient epigastrium and 2 more in the right upper quadrant all under direct vision.  The gallbladder was identified and found to be acutely inflamed and distended.  Both blunt dissection and dissection with the cautery was used to free the omentum up off of the gallbladder.  We were then able to dissect out the base of the gallbladder.  A critical window was achieved around the cystic duct and cystic artery.  The duct was clipped once distally.  The artery was then clipped twice proximally once distally.  A small opening was then made with the laparoscopic scissors and the cystic duct.  A cholangiogram was then inserted in the right upper quadrant under direct vision and then placed an opening in  the cystic duct.  A cholangiogram was then performed with contrast under fluoroscopy.  The bile duct was normal in appearance with no dilation no obvious obstruction.  The cholangiocatheter was then removed.  The cystic duct was then clipped 3 times proximally and transected.  The cystic artery was then transected as well.  The gallbladder was then slowly dissected free from the liver bed with the electrocautery.  Once it was freed from the liver bed, it was placed in an Endosac and removed through the incision at the umbilicus.  The 0 Vicryl umbilicus was tied in place closing the fascial defect.  The abdomen was then copiously irrigated with normal saline.  Again hemostasis appeared to be achieved.  All ports were then removed under direct vision and the abdomen was deflated.  All incisions were then anesthetized Marcaine and closed with 4-0 Monocryl sutures.  Dermabond was then applied.  The patient tolerated the procedure well.  All the counts were correct at the end of the procedure.  The patient was then extubated in the operating room and taken in stable condition to the recovery room. Coralie Keens   Date: 07/02/2022  Time: 1:31 PM

## 2022-07-02 NOTE — Discharge Instructions (Signed)
[facility] LAPAROSCOPIC SURGERY: POST OP INSTRUCTIONS Always review your discharge instruction sheet given to you by the facility where your surgery was performed. IF YOU HAVE DISABILITY OR FAMILY LEAVE FORMS, YOU MUST BRING THEM TO THE OFFICE FOR PROCESSING.   DO NOT GIVE THEM TO YOUR DOCTOR.  PAIN CONTROL  First take acetaminophen (Tylenol) AND/or ibuprofen (Advil) to control your pain after surgery.  Follow directions on package.  Taking acetaminophen (Tylenol) and/or ibuprofen (Advil) regularly after surgery will help to control your pain and lower the amount of prescription pain medication you may need.  You should not take more than 3,000 mg (3 grams) of acetaminophen (Tylenol) in 24 hours.  You should not take ibuprofen (Advil), aleve, motrin, naprosyn or other NSAIDS if you have a history of stomach ulcers or chronic kidney disease.  A prescription for pain medication may be given to you upon discharge.  Take your pain medication as prescribed, if you still have uncontrolled pain after taking acetaminophen (Tylenol) or ibuprofen (Advil). Use ice packs to help control pain. If you need a refill on your pain medication, please contact your pharmacy.  They will contact our office to request authorization. Prescriptions will not be filled after 5pm or on week-ends.  HOME MEDICATIONS Take your usually prescribed medications unless otherwise directed.  DIET You should follow a light diet the first few days after arrival home.  Be sure to include lots of fluids daily. Avoid fatty, fried foods.   CONSTIPATION It is common to experience some constipation after surgery and if you are taking pain medication.  Increasing fluid intake and taking a stool softener (such as Colace) will usually help or prevent this problem from occurring.  A mild laxative (Milk of Magnesia or Miralax) should be taken according to package instructions if there are no bowel movements after 48  hours.  WOUND/INCISION CARE Most patients will experience some swelling and bruising in the area of the incisions.  Ice packs will help.  Swelling and bruising can take several days to resolve.  Unless discharge instructions indicate otherwise, follow guidelines below  STERI-STRIPS - you may remove your outer bandages 48 hours after surgery, and you may shower at that time.  You have steri-strips (small skin tapes) in place directly over the incision.  These strips should be left on the skin for 7-10 days.   DERMABOND/SKIN GLUE - you may shower in 24 hours.  The glue will flake off over the next 2-3 weeks. Any sutures or staples will be removed at the office during your follow-up visit.  ACTIVITIES You may resume regular (light) daily activities beginning the next day--such as daily self-care, walking, climbing stairs--gradually increasing activities as tolerated.  You may have sexual intercourse when it is comfortable.  Refrain from any heavy lifting or straining until approved by your doctor. You may drive when you are no longer taking prescription pain medication, you can comfortably wear a seatbelt, and you can safely maneuver your car and apply brakes.  FOLLOW-UP You should see your doctor in the office for a follow-up appointment approximately 2-3 weeks after your surgery.  You should have been given your post-op/follow-up appointment when your surgery was scheduled.  If you did not receive a post-op/follow-up appointment, make sure that you call for this appointment within a day or two after you arrive home to insure a convenient appointment time.   WHEN TO CALL YOUR DOCTOR: Fever over 101.0 Inability to urinate Continued bleeding from incision.   Increased pain, redness, or drainage from the incision. Increasing abdominal pain  The clinic staff is available to answer your questions during regular business hours.  Please don't hesitate to call and ask to speak to one of the nurses for  clinical concerns.  If you have a medical emergency, go to the nearest emergency room or call 911.  A surgeon from [facility] is always on call at the hospital. [address], [address], [place], [place]  [address] ? [address] [address], [place], [place]   [address] [phone] ? [phone] ? FAX [phone] Web site: [email or web address]  

## 2022-07-02 NOTE — Progress Notes (Signed)
Patient ID: Danny [name], male   DOB: Jun 24, 1951, 71 y.o.   MRN: [id number]   Mr. Mah is now decided to proceed with surgery.  The HIDA scan has been canceled and we will proceed to the operating room for laparoscopic cholecystectomy with possible cholangiogram.  I again discussed the risks with the patient and his wife.  Surgery is scheduled

## 2022-07-02 NOTE — Transfer of Care (Signed)
Immediate Anesthesia Transfer of Care Note  Patient: Danny [name]  Procedure(s) Performed: LAPAROSCOPIC CHOLECYSTECTOMY WITH ICG DYE  INTRAOPERATIVE CHOLANGIOGRAM (Abdomen)  Patient Location: PACU  Anesthesia Type:General  Level of Consciousness: awake, drowsy and patient cooperative  Airway & Oxygen Therapy: Patient Spontanous Breathing and Patient connected to face mask oxygen  Post-op Assessment: Report given to RN and Post -op Vital signs reviewed and stable  Post vital signs: Reviewed and stable  Last Vitals:  Vitals Value Taken Time  BP 164/97 07/02/22 1352  Temp    Pulse 87 07/02/22 1353  Resp 17 07/02/22 1353  SpO2 98 % 07/02/22 1353  Vitals shown include unvalidated device data.  Last Pain:  Vitals:   07/02/22 1050  TempSrc:   PainSc: 0-No pain      Patients Stated Pain Goal: 3 (81/77/11 6579)  Complications: No notable events documented.

## 2022-07-02 NOTE — Anesthesia Preprocedure Evaluation (Addendum)
Anesthesia Evaluation  Patient identified by MRN, date of birth, ID band Patient awake    Reviewed: Allergy & Precautions, NPO status , Patient's Chart, lab work & pertinent test results  Airway Mallampati: II  TM Distance: >3 FB Neck ROM: Full    Dental  (+) Dental Advisory Given   Pulmonary former smoker,    breath sounds clear to auscultation       Cardiovascular hypertension,  Rhythm:Regular Rate:Normal  Orthostatic hypotension on florinef   Neuro/Psych  Neuromuscular disease (parkinsons dz)    GI/Hepatic Neg liver ROS, GERD  ,  Endo/Other  diabetes, Type 2, Oral Hypoglycemic Agents  Renal/GU negative Renal ROS     Musculoskeletal  (+) Arthritis ,   Abdominal   Peds  Hematology negative hematology ROS (+)   Anesthesia Other Findings   Reproductive/Obstetrics                            Anesthesia Physical Anesthesia Plan  ASA: 3  Anesthesia Plan: General   Post-op Pain Management: Tylenol PO (pre-op)*   Induction: Intravenous  PONV Risk Score and Plan: 2 and Dexamethasone, Ondansetron, Treatment may vary due to age or medical condition and Propofol infusion  Airway Management Planned: Oral ETT  Additional Equipment: None  Intra-op Plan:   Post-operative Plan: Extubation in OR  Informed Consent: I have reviewed the patients History and Physical, chart, labs and discussed the procedure including the risks, benefits and alternatives for the proposed anesthesia with the patient or authorized representative who has indicated his/her understanding and acceptance.     Dental advisory given  Plan Discussed with: CRNA  Anesthesia Plan Comments:        Anesthesia Quick Evaluation

## 2022-07-02 NOTE — Consult Note (Signed)
CC: abdominal pain  HPI:  This is a 71 year old gentleman with Parkinson disease who was admitted yesterday with epigastric abdominal pain and malaise.  He was found to have an elevated white blood count.  He had a CT of his abdomen pelvis, a CT of his chest to rule out a PE, and ultrasound of the abdomen.  This showed findings consistent with a calculus cholecystitis.  He did have a slightly elevated lipase and a dilated pancreatic duct.  HIDA scan has been ordered. He has had no previous problems regarding his gallbladder.  He does have pretty significant Parkinson's disease and he is wife reports that this symptomatically gets worse after any surgery. His bilirubin today is up to 2.1.  The rest of his liver function tests are unremarkable.  His white blood count remains elevated.  He has no cardiopulmonary issues  Past Medical History:  Diagnosis Date   Allergy    SEASONAL   Cataract    BILATERAL-REMOVED   Essential hypertension, benign    PT.DENIES ON AS PREVENTIVE 08/17/19   GERD (gastroesophageal reflux disease)    Hx of adenomatous polyp of colon 09/04/2019   08/2019 diminutive adenoma No recall given findings and age   Mixed hyperlipidemia    PT.DENIES,STATED ON AS PREVENTIVE 07/30/19   Neuromuscular disorder (HCC)    PARKINSONS   OA (osteoarthritis) of knee    right   Obesity, unspecified    Osteoarthritis    KNEE AND SHOULDERS   Parkinsons ([place])    Type II or unspecified type diabetes mellitus without mention of complication, not stated as uncontrolled     Past Surgical History:  Procedure Laterality Date   CATARACT EXTRACTION, BILATERAL     GANGLION CYST EXCISION  age 27 years   right   KNEE ARTHROSCOPY     right   KNEE ARTHROSCOPY     TOTAL KNEE ARTHROPLASTY Right 03/08/2020   Procedure: RIGHT TOTAL KNEE ARTHROPLASTY;  Surgeon: Garald Balding, MD;  Location: [facility];  Service: Orthopedics;  Laterality: Right;   WRIST FRACTURE SURGERY     WRIST SURGERY  04/2012     Family History  Problem Relation Age of Onset   Diabetes Mother    Lung cancer Mother    Cancer Father    Heart disease Father    Diabetes Sister    Diabetes Daughter    Colon polyps Son    Colon cancer Neg Hx    Esophageal cancer Neg Hx    Rectal cancer Neg Hx    Stomach cancer Neg Hx    Parkinson's disease Neg Hx     Social:  reports that he quit smoking about 39 years ago. His smoking use included cigarettes. He has a 22.50 pack-year smoking history. He has never used smokeless tobacco. He reports current alcohol use. He reports that he does not use drugs.  Allergies:  Allergies  Allergen Reactions   Codeine Nausea And Vomiting    Medications: I have reviewed the patient's current medications.  Results for orders placed or performed during the hospital encounter of 07/01/22 (from the past 48 hour(s))  Comprehensive metabolic panel     Status: Abnormal   Collection Time: 07/01/22  7:24 AM  Result Value Ref Range   Sodium 138 135 - 145 mmol/L   Potassium 4.7 3.5 - 5.1 mmol/L   Chloride 106 98 - 111 mmol/L   CO2 24 22 - 32 mmol/L   Glucose, Bld 140 (H) 70 - 99  mg/dL    Comment: Glucose reference range applies only to samples taken after fasting for at least 8 hours.   BUN 19 8 - 23 mg/dL   Creatinine, Ser 0.93 0.61 - 1.24 mg/dL   Calcium 9.7 8.9 - 10.3 mg/dL   Total Protein 6.7 6.5 - 8.1 g/dL   Albumin 3.5 3.5 - 5.0 g/dL   AST 23 15 - 41 U/L   ALT 6 0 - 44 U/L   Alkaline Phosphatase 72 38 - 126 U/L   Total Bilirubin 1.2 0.3 - 1.2 mg/dL   GFR, Estimated >60 >60 mL/min    Comment: (NOTE) Calculated using the CKD-EPI Creatinine Equation (2021)    Anion gap 8 5 - 15    Comment: Performed at [facility], [address] [place], [place] [address]  Troponin I (High Sensitivity)     Status: None   Collection Time: 07/01/22  7:24 AM  Result Value Ref Range   Troponin I (High Sensitivity) 5 <18 ng/L    Comment: (NOTE) Elevated high sensitivity troponin  I (hsTnI) values and significant  changes across serial measurements may suggest ACS but many other  chronic and acute conditions are known to elevate hsTnI results.  Refer to the "Links" section for chest pain algorithms and additional  guidance. Performed at [facility], [address] [place], [place] [address]   Lipase, blood     Status: None   Collection Time: 07/01/22  7:24 AM  Result Value Ref Range   Lipase 29 11 - 51 U/L    Comment: Performed at [facility], [address] [place], [place] [address]  CBC with Differential     Status: Abnormal   Collection Time: 07/01/22  7:24 AM  Result Value Ref Range   WBC 13.7 (H) 4.0 - 10.5 K/uL   RBC 4.21 (L) 4.22 - 5.81 MIL/uL   Hemoglobin 13.2 13.0 - 17.0 g/dL   HCT 39.1 39.0 - 52.0 %   MCV 92.9 80.0 - 100.0 fL   MCH 31.4 26.0 - 34.0 pg   MCHC 33.8 30.0 - 36.0 g/dL   RDW 14.1 11.5 - 15.5 %   Platelets 198 150 - 400 K/uL   nRBC 0.0 0.0 - 0.2 %   Neutrophils Relative % 82 %   Neutro Abs 11.2 (H) 1.7 - 7.7 K/uL   Lymphocytes Relative 10 %   Lymphs Abs 1.4 0.7 - 4.0 K/uL   Monocytes Relative 7 %   Monocytes Absolute 0.9 0.1 - 1.0 K/uL   Eosinophils Relative 1 %   Eosinophils Absolute 0.1 0.0 - 0.5 K/uL   Basophils Relative 0 %   Basophils Absolute 0.0 0.0 - 0.1 K/uL   Immature Granulocytes 0 %   Abs Immature Granulocytes 0.06 0.00 - 0.07 K/uL    Comment: Performed at [facility], [address] [place], [place] [address]  Lactic acid, plasma     Status: Abnormal   Collection Time: 07/01/22  7:24 AM  Result Value Ref Range   Lactic Acid, Venous 2.9 (HH) 0.5 - 1.9 mmol/L    Comment: CRITICAL RESULT CALLED TO, READ BACK BY AND VERIFIED WITHHartley Barefoot RN [phone] 07/01/2022 Mena Goes Performed at [facility], [address] [place], [place] [address]   Urinalysis, Routine w reflex microscopic Urine, Clean Catch     Status: None   Collection Time: 07/01/22  9:22 AM  Result  Value Ref Range   Color, Urine YELLOW YELLOW   APPearance CLEAR CLEAR   Specific Gravity, Urine 1.015 1.005 - 1.030   pH 5.5 5.0 - 8.0   Glucose, UA NEGATIVE NEGATIVE mg/dL   Hgb urine dipstick NEGATIVE NEGATIVE   Bilirubin Urine NEGATIVE NEGATIVE   Ketones, ur NEGATIVE NEGATIVE mg/dL   Protein, ur NEGATIVE NEGATIVE mg/dL   Nitrite NEGATIVE NEGATIVE   Leukocytes,Ua NEGATIVE NEGATIVE    Comment: Microscopic not done on urines with negative protein, blood, leukocytes, nitrite, or glucose < 500 mg/dL. Performed at [facility], [address] [place], [place] [address]   Lactic acid, plasma     Status: Abnormal   Collection Time: 07/01/22  9:29 AM  Result Value Ref Range   Lactic Acid, Venous 2.5 (HH) 0.5 - 1.9 mmol/L    Comment: CRITICAL RESULT CALLED TO, READ BACK BY AND VERIFIED WITHJosph Macho RN [phone] PHILLIPS C Performed at [facility], [address] [place], [place] [address]   Troponin I (High Sensitivity)     Status: None   Collection Time: 07/01/22  9:29 AM  Result Value Ref Range   Troponin I (High Sensitivity) 5 <18 ng/L    Comment: (NOTE) Elevated high sensitivity troponin I (hsTnI) values and significant  changes across serial measurements may suggest ACS but many other  chronic and acute conditions are known to elevate hsTnI results.  Refer to the "Links" section for chest pain algorithms and additional  guidance. Performed at [facility], [address] [place], [place] [address]   Culture, blood (routine x 2)     Status: None (Preliminary result)   Collection Time: 07/01/22 12:17 PM   Specimen: BLOOD LEFT HAND  Result Value Ref Range   Specimen Description      BLOOD LEFT HAND Performed at [facility], [place] [address], [place], [place] [address]    Special Requests      BOTTLES DRAWN AEROBIC ONLY Blood Culture results may not be optimal due to an inadequate volume of blood received in culture  bottles Performed at [facility], [address] [place], [place] [address]    Culture PENDING    Report Status PENDING   Culture, blood (routine x 2)     Status: None (Preliminary result)   Collection Time: 07/01/22 12:30 PM   Specimen: BLOOD RIGHT ARM  Result Value Ref Range   Specimen Description      BLOOD RIGHT ARM Performed at [facility] [address], [place], [place] [address]    Special Requests      Blood Culture adequate volume BOTTLES DRAWN AEROBIC AND ANAEROBIC Performed at [facility], [address] [place], [place] [address]    Culture PENDING    Report Status PENDING   Lactic acid, plasma     Status: Abnormal   Collection Time: 07/01/22  1:45 PM  Result Value Ref Range   Lactic Acid, Venous 2.4 (HH) 0.5 - 1.9 mmol/L    Comment: CRITICAL RESULT CALLED TO, READ BACK BY AND VERIFIED WITHMitzie Na RN [phone] PHILLIPS C Performed at [facility], [address] [place], [place] [address]   Glucose, capillary     Status: Abnormal   Collection Time: 07/01/22  6:55 PM  Result Value Ref Range   Glucose-Capillary 112 (H) 70 - 99 mg/dL    Comment:  Glucose reference range applies only to samples taken after fasting for at least 8 hours.  Glucose, capillary     Status: Abnormal   Collection Time: 07/01/22  9:28 PM  Result Value Ref Range   Glucose-Capillary 132 (H) 70 - 99 mg/dL    Comment: Glucose reference range applies only to samples taken after fasting for at least 8 hours.  Comprehensive metabolic panel     Status: Abnormal   Collection Time: 07/02/22  4:31 AM  Result Value Ref Range   Sodium 139 135 - 145 mmol/L   Potassium 3.6 3.5 - 5.1 mmol/L    Comment: DELTA CHECK NOTED   Chloride 107 98 - 111 mmol/L   CO2 26 22 - 32 mmol/L   Glucose, Bld 136 (H) 70 - 99 mg/dL    Comment: Glucose reference range applies only to samples taken after fasting for at least 8 hours.   BUN 13 8 - 23 mg/dL   Creatinine, Ser  0.84 0.61 - 1.24 mg/dL   Calcium 9.0 8.9 - 10.3 mg/dL   Total Protein 6.2 (L) 6.5 - 8.1 g/dL   Albumin 3.2 (L) 3.5 - 5.0 g/dL   AST 12 (L) 15 - 41 U/L   ALT 5 0 - 44 U/L   Alkaline Phosphatase 64 38 - 126 U/L   Total Bilirubin 2.1 (H) 0.3 - 1.2 mg/dL   GFR, Estimated >60 >60 mL/min    Comment: (NOTE) Calculated using the CKD-EPI Creatinine Equation (2021)    Anion gap 6 5 - 15    Comment: Performed at [facility], [place] [address], [place], [place] [address]  CBC     Status: Abnormal   Collection Time: 07/02/22  4:31 AM  Result Value Ref Range   WBC 10.6 (H) 4.0 - 10.5 K/uL   RBC 4.07 (L) 4.22 - 5.81 MIL/uL   Hemoglobin 13.1 13.0 - 17.0 g/dL   HCT 38.1 (L) 39.0 - 52.0 %   MCV 93.6 80.0 - 100.0 fL   MCH 32.2 26.0 - 34.0 pg   MCHC 34.4 30.0 - 36.0 g/dL   RDW 14.3 11.5 - 15.5 %   Platelets 169 150 - 400 K/uL   nRBC 0.0 0.0 - 0.2 %    Comment: Performed at [facility], [place] [address], [place], [place] [address]  Glucose, capillary     Status: Abnormal   Collection Time: 07/02/22  7:36 AM  Result Value Ref Range   Glucose-Capillary 136 (H) 70 - 99 mg/dL    Comment: Glucose reference range applies only to samples taken after fasting for at least 8 hours.    US Abdomen Limited RUQ (LIVER/GB)  Result Date: 07/01/2022 CLINICAL DATA:  71 year old male with history of right upper quadrant abdominal pain. Abnormal appearance of the gallbladder on recent CT examination. Follow-up study. Abdominal ultrasound no prior right upper quadrant ultrasound. CT the abdomen and pelvis 07/01/2022. EXAM: ULTRASOUND ABDOMEN LIMITED RIGHT UPPER QUADRANT COMPARISON:  None Available. FINDINGS: Gallbladder: No definite gallstones. Gallbladder is only moderately distended. Gallbladder wall appears thickened and edematous measuring up to 5 mm. Trace amount of pericholecystic fluid. Per report from the sonographer, there was no sonographic Murphy's sign on examination. Common  bile duct: Diameter: 2 mm Liver: No focal lesion identified. Within normal limits in parenchymal echogenicity. Portal vein is patent on color Doppler imaging with normal direction of blood flow towards the liver. Other: None. IMPRESSION: 1. Gallbladder wall appears thickened and edematous, and there  is a trace amount of pericholecystic fluid. However, there are no gallstones, and there was no sonographic Murphy's sign on examination. Overall, findings are considered equivocal, but not strongly suggestive of acute cholecystitis in the absence of a sonographic Murphy's sign. If there is clinical concern for acute acalculous cholecystitis, further evaluation with nuclear medicine hepatobiliary scan could be considered. Electronically Signed   By: Vinnie Langton M.D.   On: 07/01/2022 11:34   CT Abdomen Pelvis W Contrast  Result Date: 07/01/2022 CLINICAL DATA:  Pulmonary embolism (PE) suspected, high prob; Sepsis EXAM: CT ANGIOGRAPHY CHEST CT ABDOMEN AND PELVIS WITH CONTRAST TECHNIQUE: Multidetector CT imaging of the chest was performed using the standard protocol during bolus administration of intravenous contrast. Multiplanar CT image reconstructions and MIPs were obtained to evaluate the vascular anatomy. Multidetector CT imaging of the abdomen and pelvis was performed using the standard protocol during bolus administration of intravenous contrast. RADIATION DOSE REDUCTION: This exam was performed according to the departmental dose-optimization program which includes automated exposure control, adjustment of the mA and/or kV according to patient size and/or use of iterative reconstruction technique. CONTRAST:  198m OMNIPAQUE IOHEXOL 350 MG/ML SOLN COMPARISON:  None Available. FINDINGS: CTA CHEST FINDINGS Cardiovascular: Satisfactory opacification of the pulmonary arteries to the segmental level. No evidence of pulmonary embolism. Thoracic aorta is normal in course and caliber. Scattered atherosclerotic  calcifications of the aorta and coronary arteries. Normal heart size. No pericardial effusion. Mediastinum/Nodes: No enlarged mediastinal, hilar, or axillary lymph nodes. Thyroid gland, trachea, and esophagus demonstrate no significant findings. Lungs/Pleura: Mild bibasilar subsegmental atelectasis. Lungs are otherwise clear. No pleural effusion or pneumothorax. Musculoskeletal: No chest wall abnormality. No acute or significant osseous findings. Review of the MIP images confirms the above findings. CT ABDOMEN and PELVIS FINDINGS Hepatobiliary: Unremarkable appearance of the liver. No focal liver abnormality. Mild-moderate gallbladder distension with diffuse gallbladder wall thickening and pericholecystic fluid/stranding. No intrahepatic biliary dilatation Pancreas: Edematous appearance of the pancreatic body and tail with mild peripancreatic fat stranding and trace free fluid. No organized peripancreatic fluid collection. There is a short segment of mild pancreatic ductal dilatation within the pancreatic tail. Pancreatic tail is somewhat truncated. Spleen: Normal in size without focal abnormality. Adrenals/Urinary Tract: Unremarkable adrenal glands. Two adjacent simple cysts within the upper pole the left kidney which do not require follow-up. Kidneys appear otherwise unremarkable with symmetric enhancement. No renal stone or hydronephrosis. Urinary bladder is within normal limits. Stomach/Bowel: Stomach is within normal limits. Appendix appears normal (series 2, image 61). Colon is somewhat redundant. No evidence of bowel wall thickening, distention, or inflammatory changes. Moderate volume of stool within the colon Vascular/Lymphatic: Scattered aortoiliac atherosclerotic calcifications without aneurysm. No abdominopelvic lymphadenopathy. Slightly increased attenuation within the central mesentery with several small mesenteric lymph nodes. Reproductive: Prostate is unremarkable. Other: No ascites. No  abdominopelvic fluid collection. No pneumoperitoneum. No abdominal wall hernia. Musculoskeletal: No acute or significant osseous findings. Several old healed right-sided rib fractures. Review of the MIP images confirms the above findings. IMPRESSION: 1. No evidence of pulmonary embolism or other acute cardiopulmonary process. 2. Mild-moderate gallbladder distension with diffuse gallbladder wall thickening and pericholecystic fluid/stranding, suggestive of acute cholecystitis. Further evaluation with right upper quadrant ultrasound is recommended. 3. Appearance of the pancreas suggests mild acute uncomplicated pancreatitis. Correlate with serum lipase. There is a short segment of mild pancreatic ductal dilatation within the pancreatic tail. 4. Slightly increased attenuation within the central mesentery with several small mesenteric lymph nodes, which can be seen in the setting  of mesenteric panniculitis. 5. Moderate volume of stool within the colon. 6. Aortic and coronary artery atherosclerosis (ICD10-I70.0). Electronically Signed   By: Davina Poke D.O.   On: 07/01/2022 09:51   CT Angio Chest PE W/Cm &/Or Wo Cm  Result Date: 07/01/2022 CLINICAL DATA:  Pulmonary embolism (PE) suspected, high prob; Sepsis EXAM: CT ANGIOGRAPHY CHEST CT ABDOMEN AND PELVIS WITH CONTRAST TECHNIQUE: Multidetector CT imaging of the chest was performed using the standard protocol during bolus administration of intravenous contrast. Multiplanar CT image reconstructions and MIPs were obtained to evaluate the vascular anatomy. Multidetector CT imaging of the abdomen and pelvis was performed using the standard protocol during bolus administration of intravenous contrast. RADIATION DOSE REDUCTION: This exam was performed according to the departmental dose-optimization program which includes automated exposure control, adjustment of the mA and/or kV according to patient size and/or use of iterative reconstruction technique. CONTRAST:   121m OMNIPAQUE IOHEXOL 350 MG/ML SOLN COMPARISON:  None Available. FINDINGS: CTA CHEST FINDINGS Cardiovascular: Satisfactory opacification of the pulmonary arteries to the segmental level. No evidence of pulmonary embolism. Thoracic aorta is normal in course and caliber. Scattered atherosclerotic calcifications of the aorta and coronary arteries. Normal heart size. No pericardial effusion. Mediastinum/Nodes: No enlarged mediastinal, hilar, or axillary lymph nodes. Thyroid gland, trachea, and esophagus demonstrate no significant findings. Lungs/Pleura: Mild bibasilar subsegmental atelectasis. Lungs are otherwise clear. No pleural effusion or pneumothorax. Musculoskeletal: No chest wall abnormality. No acute or significant osseous findings. Review of the MIP images confirms the above findings. CT ABDOMEN and PELVIS FINDINGS Hepatobiliary: Unremarkable appearance of the liver. No focal liver abnormality. Mild-moderate gallbladder distension with diffuse gallbladder wall thickening and pericholecystic fluid/stranding. No intrahepatic biliary dilatation Pancreas: Edematous appearance of the pancreatic body and tail with mild peripancreatic fat stranding and trace free fluid. No organized peripancreatic fluid collection. There is a short segment of mild pancreatic ductal dilatation within the pancreatic tail. Pancreatic tail is somewhat truncated. Spleen: Normal in size without focal abnormality. Adrenals/Urinary Tract: Unremarkable adrenal glands. Two adjacent simple cysts within the upper pole the left kidney which do not require follow-up. Kidneys appear otherwise unremarkable with symmetric enhancement. No renal stone or hydronephrosis. Urinary bladder is within normal limits. Stomach/Bowel: Stomach is within normal limits. Appendix appears normal (series 2, image 61). Colon is somewhat redundant. No evidence of bowel wall thickening, distention, or inflammatory changes. Moderate volume of stool within the colon  Vascular/Lymphatic: Scattered aortoiliac atherosclerotic calcifications without aneurysm. No abdominopelvic lymphadenopathy. Slightly increased attenuation within the central mesentery with several small mesenteric lymph nodes. Reproductive: Prostate is unremarkable. Other: No ascites. No abdominopelvic fluid collection. No pneumoperitoneum. No abdominal wall hernia. Musculoskeletal: No acute or significant osseous findings. Several old healed right-sided rib fractures. Review of the MIP images confirms the above findings. IMPRESSION: 1. No evidence of pulmonary embolism or other acute cardiopulmonary process. 2. Mild-moderate gallbladder distension with diffuse gallbladder wall thickening and pericholecystic fluid/stranding, suggestive of acute cholecystitis. Further evaluation with right upper quadrant ultrasound is recommended. 3. Appearance of the pancreas suggests mild acute uncomplicated pancreatitis. Correlate with serum lipase. There is a short segment of mild pancreatic ductal dilatation within the pancreatic tail. 4. Slightly increased attenuation within the central mesentery with several small mesenteric lymph nodes, which can be seen in the setting of mesenteric panniculitis. 5. Moderate volume of stool within the colon. 6. Aortic and coronary artery atherosclerosis (ICD10-I70.0). Electronically Signed   By: NDavina PokeD.O.   On: 07/01/2022 09:51   CT Head Wo Contrast  Result Date: 07/01/2022 CLINICAL DATA:  Acute neurologic deficit.  Suspected stroke. EXAM: CT HEAD WITHOUT CONTRAST TECHNIQUE: Contiguous axial images were obtained from the base of the skull through the vertex without intravenous contrast. RADIATION DOSE REDUCTION: This exam was performed according to the departmental dose-optimization program which includes automated exposure control, adjustment of the mA and/or kV according to patient size and/or use of iterative reconstruction technique. COMPARISON:  11/08/2021 FINDINGS:  Brain: No evidence of intracranial hemorrhage, acute infarction, hydrocephalus, extra-axial collection, or mass lesion/mass effect. Mild diffuse cerebral atrophy appears stable since previous study. Vascular:  No hyperdense vessel or other acute findings. Skull: No evidence of fracture or other significant bone abnormality. Sinuses/Orbits: No acute findings. Old left orbital floor fracture again noted. Other: None. IMPRESSION: No acute intracranial abnormality. Stable mild cerebral atrophy. Electronically Signed   By: Marlaine Hind M.D.   On: 07/01/2022 09:36   DG Chest Port 1 View  Result Date: 07/01/2022 CLINICAL DATA:  71 year old male with history of chest pain. EXAM: PORTABLE CHEST 1 VIEW COMPARISON:  Chest x-ray 05/19/2020. FINDINGS: Lung volumes are normal. No consolidative airspace disease. No pleural effusions. No pneumothorax. No pulmonary nodule or mass noted. Pulmonary vasculature and the cardiomediastinal silhouette are within normal limits. IMPRESSION: No radiographic evidence of acute cardiopulmonary disease. Electronically Signed   By: Vinnie Langton M.D.   On: 07/01/2022 07:37    ROS - all of the below systems have been reviewed with the patient and positives are indicated with bold text General: chills, fever or night sweats Eyes: blurry vision or double vision ENT: epistaxis or sore throat Allergy/Immunology: itchy/watery eyes or nasal congestion Hematologic/Lymphatic: bleeding problems, blood clots or swollen lymph nodes Endocrine: temperature intolerance or unexpected weight changes Breast: new or changing breast lumps or nipple discharge Resp: cough, shortness of breath, or wheezing CV: chest pain or dyspnea on exertion GI: as per HPI GU: dysuria, trouble voiding, or hematuria MSK: joint pain or joint stiffness Neuro: TIA or stroke symptoms Derm: pruritus and skin lesion changes Psych: anxiety and depression  PE Blood pressure 101/66, pulse 87, temperature 98.4 F (36.9  C), temperature source Oral, resp. rate 20, SpO2 95 %. Constitutional: Somnolent but will answer questions Eyes: Moist conjunctiva; no lid lag; anicteric; PERRL Neck: Trachea midline; no thyromegaly Lungs: Normal respiratory effort; no tactile fremitus CV: RRR; no palpable thrills; no pitting edema GI: Abd significant tenderness with guarding in the right upper quadrant and epigastrium; no palpable hepatosplenomegaly MSK: Normal range of motion of extremities; no clubbing/cyanosis Psychiatric: Appropriate affect; alert and oriented x3 Lymphatic: No palpable cervical or axillary lymphadenopathy  Results for orders placed or performed during the hospital encounter of 07/01/22 (from the past 48 hour(s))  Comprehensive metabolic panel     Status: Abnormal   Collection Time: 07/01/22  7:24 AM  Result Value Ref Range   Sodium 138 135 - 145 mmol/L   Potassium 4.7 3.5 - 5.1 mmol/L   Chloride 106 98 - 111 mmol/L   CO2 24 22 - 32 mmol/L   Glucose, Bld 140 (H) 70 - 99 mg/dL    Comment: Glucose reference range applies only to samples taken after fasting for at least 8 hours.   BUN 19 8 - 23 mg/dL   Creatinine, Ser 0.93 0.61 - 1.24 mg/dL   Calcium 9.7 8.9 - 10.3 mg/dL   Total Protein 6.7 6.5 - 8.1 g/dL   Albumin 3.5 3.5 - 5.0 g/dL   AST 23 15 - 41 U/L  ALT 6 0 - 44 U/L   Alkaline Phosphatase 72 38 - 126 U/L   Total Bilirubin 1.2 0.3 - 1.2 mg/dL   GFR, Estimated >60 >60 mL/min    Comment: (NOTE) Calculated using the CKD-EPI Creatinine Equation (2021)    Anion gap 8 5 - 15    Comment: Performed at [facility], [address] [place], [place] [address]  Troponin I (High Sensitivity)     Status: None   Collection Time: 07/01/22  7:24 AM  Result Value Ref Range   Troponin I (High Sensitivity) 5 <18 ng/L    Comment: (NOTE) Elevated high sensitivity troponin I (hsTnI) values and significant  changes across serial measurements may suggest ACS but many other  chronic and acute  conditions are known to elevate hsTnI results.  Refer to the "Links" section for chest pain algorithms and additional  guidance. Performed at [facility], [address] [place], [place] [address]   Lipase, blood     Status: None   Collection Time: 07/01/22  7:24 AM  Result Value Ref Range   Lipase 29 11 - 51 U/L    Comment: Performed at [facility], [address] [place], [place] [address]  CBC with Differential     Status: Abnormal   Collection Time: 07/01/22  7:24 AM  Result Value Ref Range   WBC 13.7 (H) 4.0 - 10.5 K/uL   RBC 4.21 (L) 4.22 - 5.81 MIL/uL   Hemoglobin 13.2 13.0 - 17.0 g/dL   HCT 39.1 39.0 - 52.0 %   MCV 92.9 80.0 - 100.0 fL   MCH 31.4 26.0 - 34.0 pg   MCHC 33.8 30.0 - 36.0 g/dL   RDW 14.1 11.5 - 15.5 %   Platelets 198 150 - 400 K/uL   nRBC 0.0 0.0 - 0.2 %   Neutrophils Relative % 82 %   Neutro Abs 11.2 (H) 1.7 - 7.7 K/uL   Lymphocytes Relative 10 %   Lymphs Abs 1.4 0.7 - 4.0 K/uL   Monocytes Relative 7 %   Monocytes Absolute 0.9 0.1 - 1.0 K/uL   Eosinophils Relative 1 %   Eosinophils Absolute 0.1 0.0 - 0.5 K/uL   Basophils Relative 0 %   Basophils Absolute 0.0 0.0 - 0.1 K/uL   Immature Granulocytes 0 %   Abs Immature Granulocytes 0.06 0.00 - 0.07 K/uL    Comment: Performed at [facility], [address] [place], [place] [address]  Lactic acid, plasma     Status: Abnormal   Collection Time: 07/01/22  7:24 AM  Result Value Ref Range   Lactic Acid, Venous 2.9 (HH) 0.5 - 1.9 mmol/L    Comment: CRITICAL RESULT CALLED TO, READ BACK BY AND VERIFIED WITHHartley Barefoot RN [phone] 07/01/2022 Mena Goes Performed at [facility], [address] [place], [place] [address]   Urinalysis, Routine w reflex microscopic Urine, Clean Catch     Status: None   Collection Time: 07/01/22  9:22 AM  Result Value Ref Range   Color, Urine YELLOW YELLOW   APPearance CLEAR CLEAR   Specific Gravity, Urine 1.015 1.005 - 1.030    pH 5.5 5.0 - 8.0   Glucose, UA NEGATIVE NEGATIVE mg/dL   Hgb urine dipstick NEGATIVE NEGATIVE   Bilirubin Urine NEGATIVE NEGATIVE   Ketones, ur NEGATIVE NEGATIVE mg/dL   Protein, ur NEGATIVE NEGATIVE mg/dL   Nitrite NEGATIVE NEGATIVE  Leukocytes,Ua NEGATIVE NEGATIVE    Comment: Microscopic not done on urines with negative protein, blood, leukocytes, nitrite, or glucose < 500 mg/dL. Performed at [facility], [address] [place], [place] [address]   Lactic acid, plasma     Status: Abnormal   Collection Time: 07/01/22  9:29 AM  Result Value Ref Range   Lactic Acid, Venous 2.5 (HH) 0.5 - 1.9 mmol/L    Comment: CRITICAL RESULT CALLED TO, READ BACK BY AND VERIFIED WITHJosph Macho RN [phone] PHILLIPS C Performed at [facility], [address] [place], [place] [address]   Troponin I (High Sensitivity)     Status: None   Collection Time: 07/01/22  9:29 AM  Result Value Ref Range   Troponin I (High Sensitivity) 5 <18 ng/L    Comment: (NOTE) Elevated high sensitivity troponin I (hsTnI) values and significant  changes across serial measurements may suggest ACS but many other  chronic and acute conditions are known to elevate hsTnI results.  Refer to the "Links" section for chest pain algorithms and additional  guidance. Performed at [facility], [address] [place], [place] [address]   Culture, blood (routine x 2)     Status: None (Preliminary result)   Collection Time: 07/01/22 12:17 PM   Specimen: BLOOD LEFT HAND  Result Value Ref Range   Specimen Description      BLOOD LEFT HAND Performed at [facility], [address] [address] [place], [place] [address]    Special Requests      BOTTLES DRAWN AEROBIC ONLY Blood Culture results may not be optimal due to an inadequate volume of blood received in culture bottles Performed at [facility], [address] [place], [place] [address]    Culture PENDING    Report Status PENDING    Culture, blood (routine x 2)     Status: None (Preliminary result)   Collection Time: 07/01/22 12:30 PM   Specimen: BLOOD RIGHT ARM  Result Value Ref Range   Specimen Description      BLOOD RIGHT ARM Performed at [facility] [address] [place], [place] [address]    Special Requests      Blood Culture adequate volume BOTTLES DRAWN AEROBIC AND ANAEROBIC Performed at [facility], [address] [place], [place] [address]    Culture PENDING    Report Status PENDING   Lactic acid, plasma     Status: Abnormal   Collection Time: 07/01/22  1:45 PM  Result Value Ref Range   Lactic Acid, Venous 2.4 (HH) 0.5 - 1.9 mmol/L    Comment: CRITICAL RESULT CALLED TO, READ BACK BY AND VERIFIED WITH: Mitzie Na RN [phone] PHILLIPS C Performed at [facility], [address] [place], [place] [address]   Glucose, capillary     Status: Abnormal   Collection Time: 07/01/22  6:55 PM  Result Value Ref Range   Glucose-Capillary 112 (H) 70 - 99 mg/dL    Comment: Glucose reference range applies only to samples taken after fasting for at least 8 hours.  Glucose, capillary     Status: Abnormal   Collection Time: 07/01/22  9:28 PM  Result Value Ref Range   Glucose-Capillary 132 (H) 70 - 99 mg/dL    Comment: Glucose reference range applies only to samples taken after fasting for at least 8 hours.  Comprehensive metabolic panel  Status: Abnormal   Collection Time: 07/02/22  4:31 AM  Result Value Ref Range   Sodium 139 135 - 145 mmol/L   Potassium 3.6 3.5 - 5.1 mmol/L    Comment: DELTA CHECK NOTED   Chloride 107 98 - 111 mmol/L   CO2 26 22 - 32 mmol/L   Glucose, Bld 136 (H) 70 - 99 mg/dL    Comment: Glucose reference range applies only to samples taken after fasting for at least 8 hours.   BUN 13 8 - 23 mg/dL   Creatinine, Ser 0.84 0.61 - 1.24 mg/dL   Calcium 9.0 8.9 - 10.3 mg/dL   Total Protein 6.2 (L) 6.5 - 8.1 g/dL   Albumin 3.2 (L) 3.5 - 5.0 g/dL   AST 12  (L) 15 - 41 U/L   ALT 5 0 - 44 U/L   Alkaline Phosphatase 64 38 - 126 U/L   Total Bilirubin 2.1 (H) 0.3 - 1.2 mg/dL   GFR, Estimated >60 >60 mL/min    Comment: (NOTE) Calculated using the CKD-EPI Creatinine Equation (2021)    Anion gap 6 5 - 15    Comment: Performed at [facility], [place] [address] [place], [place] [address]  CBC     Status: Abnormal   Collection Time: 07/02/22  4:31 AM  Result Value Ref Range   WBC 10.6 (H) 4.0 - 10.5 K/uL   RBC 4.07 (L) 4.22 - 5.81 MIL/uL   Hemoglobin 13.1 13.0 - 17.0 g/dL   HCT 38.1 (L) 39.0 - 52.0 %   MCV 93.6 80.0 - 100.0 fL   MCH 32.2 26.0 - 34.0 pg   MCHC 34.4 30.0 - 36.0 g/dL   RDW 14.3 11.5 - 15.5 %   Platelets 169 150 - 400 K/uL   nRBC 0.0 0.0 - 0.2 %    Comment: Performed at [facility], [place] [address] [place], [place] [address]  Glucose, capillary     Status: Abnormal   Collection Time: 07/02/22  7:36 AM  Result Value Ref Range   Glucose-Capillary 136 (H) 70 - 99 mg/dL    Comment: Glucose reference range applies only to samples taken after fasting for at least 8 hours.    US Abdomen Limited RUQ (LIVER/GB)  Result Date: 07/01/2022 CLINICAL DATA:  71 year old male with history of right upper quadrant abdominal pain. Abnormal appearance of the gallbladder on recent CT examination. Follow-up study. Abdominal ultrasound no prior right upper quadrant ultrasound. CT the abdomen and pelvis 07/01/2022. EXAM: ULTRASOUND ABDOMEN LIMITED RIGHT UPPER QUADRANT COMPARISON:  None Available. FINDINGS: Gallbladder: No definite gallstones. Gallbladder is only moderately distended. Gallbladder wall appears thickened and edematous measuring up to 5 mm. Trace amount of pericholecystic fluid. Per report from the sonographer, there was no sonographic Murphy's sign on examination. Common bile duct: Diameter: 2 mm Liver: No focal lesion identified. Within normal limits in parenchymal echogenicity. Portal vein is patent on  color Doppler imaging with normal direction of blood flow towards the liver. Other: None. IMPRESSION: 1. Gallbladder wall appears thickened and edematous, and there is a trace amount of pericholecystic fluid. However, there are no gallstones, and there was no sonographic Murphy's sign on examination. Overall, findings are considered equivocal, but not strongly suggestive of acute cholecystitis in the absence of a sonographic Murphy's sign. If there is clinical concern for acute acalculous cholecystitis, further evaluation with nuclear medicine hepatobiliary scan could be considered. Electronically Signed   By: Vinnie Langton M.D.   On: 07/01/2022  11:34   CT Abdomen Pelvis W Contrast  Result Date: 07/01/2022 CLINICAL DATA:  Pulmonary embolism (PE) suspected, high prob; Sepsis EXAM: CT ANGIOGRAPHY CHEST CT ABDOMEN AND PELVIS WITH CONTRAST TECHNIQUE: Multidetector CT imaging of the chest was performed using the standard protocol during bolus administration of intravenous contrast. Multiplanar CT image reconstructions and MIPs were obtained to evaluate the vascular anatomy. Multidetector CT imaging of the abdomen and pelvis was performed using the standard protocol during bolus administration of intravenous contrast. RADIATION DOSE REDUCTION: This exam was performed according to the departmental dose-optimization program which includes automated exposure control, adjustment of the mA and/or kV according to patient size and/or use of iterative reconstruction technique. CONTRAST:  169m OMNIPAQUE IOHEXOL 350 MG/ML SOLN COMPARISON:  None Available. FINDINGS: CTA CHEST FINDINGS Cardiovascular: Satisfactory opacification of the pulmonary arteries to the segmental level. No evidence of pulmonary embolism. Thoracic aorta is normal in course and caliber. Scattered atherosclerotic calcifications of the aorta and coronary arteries. Normal heart size. No pericardial effusion. Mediastinum/Nodes: No enlarged mediastinal,  hilar, or axillary lymph nodes. Thyroid gland, trachea, and esophagus demonstrate no significant findings. Lungs/Pleura: Mild bibasilar subsegmental atelectasis. Lungs are otherwise clear. No pleural effusion or pneumothorax. Musculoskeletal: No chest wall abnormality. No acute or significant osseous findings. Review of the MIP images confirms the above findings. CT ABDOMEN and PELVIS FINDINGS Hepatobiliary: Unremarkable appearance of the liver. No focal liver abnormality. Mild-moderate gallbladder distension with diffuse gallbladder wall thickening and pericholecystic fluid/stranding. No intrahepatic biliary dilatation Pancreas: Edematous appearance of the pancreatic body and tail with mild peripancreatic fat stranding and trace free fluid. No organized peripancreatic fluid collection. There is a short segment of mild pancreatic ductal dilatation within the pancreatic tail. Pancreatic tail is somewhat truncated. Spleen: Normal in size without focal abnormality. Adrenals/Urinary Tract: Unremarkable adrenal glands. Two adjacent simple cysts within the upper pole the left kidney which do not require follow-up. Kidneys appear otherwise unremarkable with symmetric enhancement. No renal stone or hydronephrosis. Urinary bladder is within normal limits. Stomach/Bowel: Stomach is within normal limits. Appendix appears normal (series 2, image 61). Colon is somewhat redundant. No evidence of bowel wall thickening, distention, or inflammatory changes. Moderate volume of stool within the colon Vascular/Lymphatic: Scattered aortoiliac atherosclerotic calcifications without aneurysm. No abdominopelvic lymphadenopathy. Slightly increased attenuation within the central mesentery with several small mesenteric lymph nodes. Reproductive: Prostate is unremarkable. Other: No ascites. No abdominopelvic fluid collection. No pneumoperitoneum. No abdominal wall hernia. Musculoskeletal: No acute or significant osseous findings. Several old  healed right-sided rib fractures. Review of the MIP images confirms the above findings. IMPRESSION: 1. No evidence of pulmonary embolism or other acute cardiopulmonary process. 2. Mild-moderate gallbladder distension with diffuse gallbladder wall thickening and pericholecystic fluid/stranding, suggestive of acute cholecystitis. Further evaluation with right upper quadrant ultrasound is recommended. 3. Appearance of the pancreas suggests mild acute uncomplicated pancreatitis. Correlate with serum lipase. There is a short segment of mild pancreatic ductal dilatation within the pancreatic tail. 4. Slightly increased attenuation within the central mesentery with several small mesenteric lymph nodes, which can be seen in the setting of mesenteric panniculitis. 5. Moderate volume of stool within the colon. 6. Aortic and coronary artery atherosclerosis (ICD10-I70.0). Electronically Signed   By: NDavina PokeD.O.   On: 07/01/2022 09:51   CT Angio Chest PE W/Cm &/Or Wo Cm  Result Date: 07/01/2022 CLINICAL DATA:  Pulmonary embolism (PE) suspected, high prob; Sepsis EXAM: CT ANGIOGRAPHY CHEST CT ABDOMEN AND PELVIS WITH CONTRAST TECHNIQUE: Multidetector CT imaging of the chest  was performed using the standard protocol during bolus administration of intravenous contrast. Multiplanar CT image reconstructions and MIPs were obtained to evaluate the vascular anatomy. Multidetector CT imaging of the abdomen and pelvis was performed using the standard protocol during bolus administration of intravenous contrast. RADIATION DOSE REDUCTION: This exam was performed according to the departmental dose-optimization program which includes automated exposure control, adjustment of the mA and/or kV according to patient size and/or use of iterative reconstruction technique. CONTRAST:  116m OMNIPAQUE IOHEXOL 350 MG/ML SOLN COMPARISON:  None Available. FINDINGS: CTA CHEST FINDINGS Cardiovascular: Satisfactory opacification of the  pulmonary arteries to the segmental level. No evidence of pulmonary embolism. Thoracic aorta is normal in course and caliber. Scattered atherosclerotic calcifications of the aorta and coronary arteries. Normal heart size. No pericardial effusion. Mediastinum/Nodes: No enlarged mediastinal, hilar, or axillary lymph nodes. Thyroid gland, trachea, and esophagus demonstrate no significant findings. Lungs/Pleura: Mild bibasilar subsegmental atelectasis. Lungs are otherwise clear. No pleural effusion or pneumothorax. Musculoskeletal: No chest wall abnormality. No acute or significant osseous findings. Review of the MIP images confirms the above findings. CT ABDOMEN and PELVIS FINDINGS Hepatobiliary: Unremarkable appearance of the liver. No focal liver abnormality. Mild-moderate gallbladder distension with diffuse gallbladder wall thickening and pericholecystic fluid/stranding. No intrahepatic biliary dilatation Pancreas: Edematous appearance of the pancreatic body and tail with mild peripancreatic fat stranding and trace free fluid. No organized peripancreatic fluid collection. There is a short segment of mild pancreatic ductal dilatation within the pancreatic tail. Pancreatic tail is somewhat truncated. Spleen: Normal in size without focal abnormality. Adrenals/Urinary Tract: Unremarkable adrenal glands. Two adjacent simple cysts within the upper pole the left kidney which do not require follow-up. Kidneys appear otherwise unremarkable with symmetric enhancement. No renal stone or hydronephrosis. Urinary bladder is within normal limits. Stomach/Bowel: Stomach is within normal limits. Appendix appears normal (series 2, image 61). Colon is somewhat redundant. No evidence of bowel wall thickening, distention, or inflammatory changes. Moderate volume of stool within the colon Vascular/Lymphatic: Scattered aortoiliac atherosclerotic calcifications without aneurysm. No abdominopelvic lymphadenopathy. Slightly increased  attenuation within the central mesentery with several small mesenteric lymph nodes. Reproductive: Prostate is unremarkable. Other: No ascites. No abdominopelvic fluid collection. No pneumoperitoneum. No abdominal wall hernia. Musculoskeletal: No acute or significant osseous findings. Several old healed right-sided rib fractures. Review of the MIP images confirms the above findings. IMPRESSION: 1. No evidence of pulmonary embolism or other acute cardiopulmonary process. 2. Mild-moderate gallbladder distension with diffuse gallbladder wall thickening and pericholecystic fluid/stranding, suggestive of acute cholecystitis. Further evaluation with right upper quadrant ultrasound is recommended. 3. Appearance of the pancreas suggests mild acute uncomplicated pancreatitis. Correlate with serum lipase. There is a short segment of mild pancreatic ductal dilatation within the pancreatic tail. 4. Slightly increased attenuation within the central mesentery with several small mesenteric lymph nodes, which can be seen in the setting of mesenteric panniculitis. 5. Moderate volume of stool within the colon. 6. Aortic and coronary artery atherosclerosis (ICD10-I70.0). Electronically Signed   By: NDavina PokeD.O.   On: 07/01/2022 09:51   CT Head Wo Contrast  Result Date: 07/01/2022 CLINICAL DATA:  Acute neurologic deficit.  Suspected stroke. EXAM: CT HEAD WITHOUT CONTRAST TECHNIQUE: Contiguous axial images were obtained from the base of the skull through the vertex without intravenous contrast. RADIATION DOSE REDUCTION: This exam was performed according to the departmental dose-optimization program which includes automated exposure control, adjustment of the mA and/or kV according to patient size and/or use of iterative reconstruction technique. COMPARISON:  11/08/2021 FINDINGS:  Brain: No evidence of intracranial hemorrhage, acute infarction, hydrocephalus, extra-axial collection, or mass lesion/mass effect. Mild diffuse  cerebral atrophy appears stable since previous study. Vascular:  No hyperdense vessel or other acute findings. Skull: No evidence of fracture or other significant bone abnormality. Sinuses/Orbits: No acute findings. Old left orbital floor fracture again noted. Other: None. IMPRESSION: No acute intracranial abnormality. Stable mild cerebral atrophy. Electronically Signed   By: Marlaine Hind M.D.   On: 07/01/2022 09:36   DG Chest Port 1 View  Result Date: 07/01/2022 CLINICAL DATA:  71 year old male with history of chest pain. EXAM: PORTABLE CHEST 1 VIEW COMPARISON:  Chest x-ray 05/19/2020. FINDINGS: Lung volumes are normal. No consolidative airspace disease. No pleural effusions. No pneumothorax. No pulmonary nodule or mass noted. Pulmonary vasculature and the cardiomediastinal silhouette are within normal limits. IMPRESSION: No radiographic evidence of acute cardiopulmonary disease. Electronically Signed   By: Vinnie Langton M.D.   On: 07/01/2022 07:37    I have personally reviewed the relevant x-ray data  A/P: Acute cholecystitis  There are no gallstones on his ultrasound but he may have still passed 1 stone given the elevation of his lipase and bilirubin.  This may also be just a phenomenon from the distended, inflamed gallbladder.  I discussed the diagnosis with the patient and his wife.  A HIDA scan has been ordered.  We discussed proceeding with a urgent cholecystectomy versus a potential percutaneous cholecystostomy tube.  He is uncertain of how he wants to proceed but he would like to go ahead and get the HIDA scan prior to surgery or drain.  I discussed proceeding with laparoscopic cholecystectomy and we discussed the risk which includes but is not limited to bleeding, infection, bile duct injury, bile leak, the need to convert to an open procedure, cardiopulmonary issues, difficulty with the Parkinson's postoperatively, etc.  We will continue to follow him closely until ultimate plans are  made.  Complex medical decision making  Coralie Keens, MD [facility], [facility]

## 2022-07-02 NOTE — Anesthesia Procedure Notes (Addendum)
Procedure Name: Intubation Date/Time: 07/02/2022 12:17 PM  Performed by: Lavina Hamman, CRNAPre-anesthesia Checklist: Patient identified, Emergency Drugs available, Suction available and Patient being monitored Patient Re-evaluated:Patient Re-evaluated prior to induction Oxygen Delivery Method: Circle system utilized Preoxygenation: Pre-oxygenation with 100% oxygen Induction Type: IV induction Ventilation: Mask ventilation without difficulty Laryngoscope Size: Mac and 4 Grade View: Grade I Tube type: Oral Tube size: 7.5 mm Number of attempts: 1 Airway Equipment and Method: Stylet and Oral airway Placement Confirmation: ETT inserted through vocal cords under direct vision, positive ETCO2 and breath sounds checked- equal and bilateral Secured at: 23 cm Tube secured with: Tape Dental Injury: Teeth and Oropharynx as per pre-operative assessment  Comments: ATOI, attempt at DL by EMS student but no view.

## 2022-07-02 NOTE — Progress Notes (Signed)
  Progress Note   Patient: Danny [name] [id number]:[id number] DOB: [birth year] DOA: 07/01/2022     1 DOS: the patient was seen and examined on 07/02/2022 at 9:30AM      Brief hospital course: Danny [name] is a 71 y.o. M with Parkinson's disease, c/b orthostatic hypotension and hallucinations and DM who presented with acute cholecystitis.     Assessment and Plan: * Acute cholecystitis RUQ pain on exam and CT and US imaging showing pericholecystic fluid and stranding. T bili up today. No stones visualized.   Discussed with General Surgery, they recommend cholecystectomy if patient willing. Discussed with wife, given there is risk for delirium with or without surgery, and that surgery offers definitive treatment of his condition, she favors surgery. - Continue antibiotics - Consult General Surgery - Plan to go to the OR today   Acute pancreatitis Epigastric pain and CT imaging showing some mild pancreatitis.  Lipase normal. If this is pancreatitis, it is mild.  After surgery, will plan to advance diet - Bowel rest - IV fluids   Hypotension Chronic hypotension due to Parkinson's disease - Continue Florinef  Parkinson disease ([place]) Complicated by memory loss, hypotension, hallucinations, recurrent falls, freezing, constipation and dyskinesias - Continue Sinemet - Continue Florinef   Mixed hyperlipidemia - Continue simvastatin  Diabetes mellitus type 2, uncomplicated (HCC) - Hold metformin - SS correction insulin          Subjective: Patient fairly agitated overnight and hallucinating.  This is close to his baseline, worse because he is in the hospital.  He had no fever, no vomiting.  No respiratory symptoms.  No chest discomfort.     Physical Exam: Vitals:   07/01/22 1713 07/01/22 1954 07/02/22 0013 07/02/22 0336  BP: (!) 160/85 (!) 158/87 (!) 164/98 101/66  Pulse: 76 78 93 87  Resp: '18 20 18 20  '$ Temp: 98.4 F (36.9 C) 97.8 F (36.6 C) 98.1 F (36.7 C) 98.4 F  (36.9 C)  TempSrc: Oral Oral Oral Oral  SpO2: 96% 97% 97% 95%   Elderly adult male, lying in bed, sleeps, opens his eyes and answers questions briefly then falls back asleep RRR, no murmurs, no peripheral edema Respiratory rate normal, lungs clear without rales or wheezes Mild right upper quadrant tenderness, reviewed EKG chest and some pain medicine Attention diminished, affect blunted, judgment and insight appear impaired, he has mild dementia, he is bradykinetic, he has a right facial droop which is his baseline, his speech is slurred at baseline.      Data Reviewed: Basic metabolic panel shows normal sodium, potassium, and creatinine.  LFTs normal but total bilirubin is trended up White blood cell count down to 10.6K Platelets and hemoglobin normal Glucose normal     Family Communication: Wife at the bedside    Disposition: Status is: Inpatient Patient was admitted with acute cholecystitis  General surgery been consulted and they will take him to the OR today        Author: Edwin Dada, MD 07/02/2022 10:31 AM  For on call review [email or web address].

## 2022-07-03 ENCOUNTER — Encounter (HOSPITAL_COMMUNITY): Payer: Self-pay | Admitting: Surgery

## 2022-07-03 DIAGNOSIS — E119 Type 2 diabetes mellitus without complications: Secondary | ICD-10-CM | POA: Diagnosis not present

## 2022-07-03 DIAGNOSIS — K869 Disease of pancreas, unspecified: Secondary | ICD-10-CM

## 2022-07-03 DIAGNOSIS — K859 Acute pancreatitis without necrosis or infection, unspecified: Secondary | ICD-10-CM | POA: Diagnosis not present

## 2022-07-03 DIAGNOSIS — K810 Acute cholecystitis: Secondary | ICD-10-CM | POA: Diagnosis not present

## 2022-07-03 DIAGNOSIS — G903 Multi-system degeneration of the autonomic nervous system: Secondary | ICD-10-CM | POA: Diagnosis not present

## 2022-07-03 LAB — COMPREHENSIVE METABOLIC PANEL
ALT: 6 U/L (ref 0–44)
AST: 19 U/L (ref 15–41)
Albumin: 3.2 g/dL — ABNORMAL LOW (ref 3.5–5.0)
Alkaline Phosphatase: 62 U/L (ref 38–126)
Anion gap: 7 (ref 5–15)
BUN: 11 mg/dL (ref 8–23)
CO2: 25 mmol/L (ref 22–32)
Calcium: 9 mg/dL (ref 8.9–10.3)
Chloride: 111 mmol/L (ref 98–111)
Creatinine, Ser: 0.84 mg/dL (ref 0.61–1.24)
GFR, Estimated: >60 mL/min (ref >60)
Glucose, Bld: 134 mg/dL — ABNORMAL HIGH (ref 70–99)
Potassium: 3.9 mmol/L (ref 3.5–5.1)
Sodium: 143 mmol/L (ref 135–145)
Total Bilirubin: 1.6 mg/dL — ABNORMAL HIGH (ref 0.3–1.2)
Total Protein: 6.3 g/dL — ABNORMAL LOW (ref 6.5–8.1)

## 2022-07-03 LAB — CBC
HCT: 38.1 % — ABNORMAL LOW (ref 39.0–52.0)
Hemoglobin: 12.7 g/dL — ABNORMAL LOW (ref 13.0–17.0)
MCH: 31.6 pg (ref 26.0–34.0)
MCHC: 33.3 g/dL (ref 30.0–36.0)
MCV: 94.8 fL (ref 80.0–100.0)
Platelets: 172 10*3/uL (ref 150–400)
RBC: 4.02 MIL/uL — ABNORMAL LOW (ref 4.22–5.81)
RDW: 14.3 % (ref 11.5–15.5)
WBC: 11.3 10*3/uL — ABNORMAL HIGH (ref 4.0–10.5)
nRBC: 0 % (ref 0.0–0.2)

## 2022-07-03 LAB — GLUCOSE, CAPILLARY
Glucose-Capillary: 105 mg/dL — ABNORMAL HIGH (ref 70–99)
Glucose-Capillary: 173 mg/dL — ABNORMAL HIGH (ref 70–99)
Glucose-Capillary: 185 mg/dL — ABNORMAL HIGH (ref 70–99)
Glucose-Capillary: 164 mg/dL — ABNORMAL HIGH (ref 70–99)

## 2022-07-03 LAB — SURGICAL PATHOLOGY

## 2022-07-03 MED ORDER — HEPARIN SODIUM (PORCINE) 5000 UNIT/ML IJ SOLN
5000.0000 [IU] | Freq: Three times a day (TID) | INTRAMUSCULAR | Status: DC
  Administered 2022-07-03 – 2022-07-05 (×6): 5000 [IU] via SUBCUTANEOUS
  Filled 2022-07-03 (×6): qty 1

## 2022-07-03 MED ORDER — SODIUM CHLORIDE 0.9% FLUSH
3.0000 mL | Freq: Two times a day (BID) | INTRAVENOUS | Status: DC
  Administered 2022-07-03 – 2022-07-05 (×5): 3 mL via INTRAVENOUS

## 2022-07-03 MED ORDER — SODIUM CHLORIDE 0.9 % IV SOLN: 250.0000 mL | INTRAVENOUS | Status: DC | PRN

## 2022-07-03 MED ORDER — SODIUM CHLORIDE 0.9% FLUSH: 3.0000 mL | INTRAVENOUS | Status: DC | PRN

## 2022-07-03 NOTE — Progress Notes (Signed)
Progress Note   Patient: Danny [name] [id number] DOB: [birth year] DOA: 07/01/2022     2 DOS: the patient was seen and examined on 07/03/2022 at 1:05PM      Brief hospital course: Danny [name] is a 71 y.o. M with Parkinson's disease, c/b orthostatic hypotension and hallucinations and DM who presented with acute epigastric pain.  Patient was in normal state of health until night before admission when he developed epigastric pain, malaise.  Pain persisted overnight, did not go away, and in the morning he woke up feeling like he could not breathe suddenly so wife called EMS.  In the ER he was hypotensive, WBC 13 K, lactate 2.9.  He was given fluids and the blood pressure normalized and lactate improved.  CT angiogram of the chest rule out PE.  KG normal.  CT of the abdomen and pelvis however showed cholecystitis and pancreatitis so is given antibiotics IV fluids and admitted for acute cholecystitis     Assessment and Plan: * Acute cholecystitis P/w RUQ pain on exam and CT and US imaging showing pericholecystic fluid and stranding. No stones visualized.  General Surgery consulted and took to OR for cholecystectomy 7/17, findings confirmed cholecystitis.  - Stop antibiotics - Advance diet to regular today - Pain control and PT - Today patient is fairly limited by pain from movement, not able to safely return home; I hope by tomorrow this will be better and he will be ready for discharge, if not, may have to consider SNF      Possible pancreatic lesion At admission, CT concerning for "possible pancreatitis."  Lipase normal and patient without symptoms distinguishable from cholecystitis.    Radiology reviewed intraoperative cholangioram and were concerned he may have a pancreatic lesion with associated down stream pancreatic ductal dilation and atrophy - General Surgery will order outpatient MRI abdomen to follow up  Hypotension Chronic hypotension due to Parkinson's disease -  Continue Florinef  Parkinson disease (Danny [name]) Complicated by memory loss, hypotension, hallucinations, recurrent falls, freezing, constipation and dyskinesias  He had mild sundowning his first night, but no frank encephalopathy yet. - Continue Sinemet - Continue Florinef - PT    Mixed hyperlipidemia - Continue simvastatin  Diabetes mellitus type 2, uncomplicated (HCC) - Hold metformin - SS correction insulin          Subjective: Patient more alert this afternoon but still quite weak, has not been out of bed at all.  He is hungry and advancing his diet this afternoon.  No confusion overnight last night, no fever, dyspnea, respiratory distress.     Physical Exam: Vitals:   07/02/22 1449 07/02/22 2132 07/03/22 0348 07/03/22 1220  BP: (!) 148/90 (!) 157/89 (!) 144/89 138/72  Pulse: 96 (!) 103 83 70  Resp: '20 20 16 16  '$ Temp: 99.1 F (37.3 C) 98.2 F (36.8 C) (!) 97.3 F (36.3 C) 97.7 F (36.5 C)  TempSrc: Oral Oral Axillary Axillary  SpO2: 93% 93% 96% 97%  Weight:      Height:       Elderly adult male, lying in bed, neck pillow in place, being fed lunch by his wife RRR, no murmurs, no peripheral edema Respiratory rate normal, lungs clear without rales or wheezes Abdomen some mild nonfocal tenderness, no rigidity, no guarding Attention somewhat distracted, affect blunted, bradykinesia noted, he appears severely generally weak, barely able to lift his head, speech hypophonic and somewhat slurred, similar to baseline, mild right facial droop, similar to baseline.    Data  Reviewed: Hemogram notable for white blood cell count 11, hemoglobin slightly down to 12 Basic metabolic panel with normal sodium, potassium, creatinine LFT showed total bilirubin down to 1.6  Family Communication: Wife at the bedside    Disposition: Status is: Inpatient The patient was admitted with cholecystitis.  He will advance to regular diet today, if no problems with regular diet, and  able to work with physical therapy, and TOC are able to set up a safe discharge plan with home health by tomorrow, hopefully home tomorrow             Author: Edwin Dada, MD 07/03/2022 3:53 PM  For on call review [email or web address].

## 2022-07-03 NOTE — Assessment & Plan Note (Addendum)
At admission, CT concerning for "possible pancreatitis."  Lipase normal and patient without symptoms distinguishable from cholecystitis.    Radiology reviewed intraoperative cholangioram and were concerned he may have a pancreatic lesion with associated down stream pancreatic ductal dilation and atrophy - General Surgery will order outpatient MRI abdomen to follow up

## 2022-07-03 NOTE — Plan of Care (Signed)

## 2022-07-03 NOTE — TOC Initial Note (Signed)
Transition of Care [facility]) - Initial/Assessment Note    Patient Details  Name: Danny [name] MRN: [id number] Date of Birth: 11/10/1951  [facility]) CM/SW Contact:    Dessa Phi, RN Phone Number: 07/03/2022, 4:02 PM  Clinical Narrative: PT recc HHPT;OT recc SNF-per medicare regs OT alone is not a skill for covereage of medicare benefit. [facility] following for HHPT/OT;[facility] rep danielle following for hospital bed-[place] orders for HHC, & dme if MD agree. Hospital bed must be delivered to home prior d/c. May need PTAR @ d/c-spouse will discuss in am.                  Expected Discharge Plan: [place] Barriers to Discharge: Continued Medical Work up   Patient Goals and CMS Choice Patient states their goals for this hospitalization and ongoing recovery are:: Home CMS Medicare.gov Compare Post Acute Care list provided to:: Patient Represenative (must comment) (Judy spouse) Choice offered to / list presented to : Spouse  Expected Discharge Plan and Services Expected Discharge Plan: [place]   Discharge Planning Services: CM Consult Post Acute Care Choice: [place] arrangements for the past 2 months: [place]                 DME Arranged: Hospital bed DME Agency: [facility] Date DME Agency Contacted: 07/03/22 Time DME Agency Contacted: [phone] Representative spoke with at DME Agency: [place]: PT, OT [place] Agency: [place] ([place]) Date [place]: 07/03/22 Time [place]: 1559 Representative spoke with at [place]: [place] Arrangements/Services Living arrangements for the past 2 months: [place] with:: Spouse Patient language and need for interpreter reviewed:: Yes Do you feel safe going back to the place where you live?: Yes      Need for Family Participation in Patient Care: Yes (Comment) Care giver support system in place?: Yes  (comment)   Criminal Activity/Legal Involvement Pertinent to Current Situation/Hospitalization: No - Comment as needed  Activities of Daily Living Home Assistive Devices/Equipment: Wheelchair, Environmental consultant (specify type) ADL Screening (condition at time of admission) Patient's cognitive ability adequate to safely complete daily activities?: Yes Is the patient deaf or have difficulty hearing?: No Does the patient have difficulty seeing, even when wearing glasses/contacts?: No Does the patient have difficulty concentrating, remembering, or making decisions?: No Patient able to express need for assistance with ADLs?: Yes Does the patient have difficulty dressing or bathing?: No Independently performs ADLs?: No Communication: Independent Dressing (OT): Needs assistance Is this a change from baseline?: Pre-admission baseline Grooming: Needs assistance Is this a change from baseline?: Pre-admission baseline Feeding: Independent Bathing: Needs assistance Is this a change from baseline?: Pre-admission baseline Toileting: Needs assistance Is this a change from baseline?: Pre-admission baseline In/Out Bed: Needs assistance Is this a change from baseline?: Pre-admission baseline Walks in Home: Needs assistance Is this a change from baseline?: Pre-admission baseline Does the patient have difficulty walking or climbing stairs?: Yes Weakness of Legs: None Weakness of Arms/Hands: None  Permission Sought/Granted Permission sought to share information with : Case Manager Permission granted to share information with : Yes, Verbal Permission Granted  Share Information with NAME: Case manager           Emotional Assessment Appearance:: Appears stated age Attitude/Demeanor/Rapport: Gracious Affect (typically observed): Accepting Orientation: : Oriented to Self, Oriented to Place, Oriented to  Time, Oriented to Situation Alcohol / Substance Use: Not Applicable  Psych Involvement: No  (comment)  Admission diagnosis:  Sepsis due to undetermined organism [facility]) [A41.9] Patient Active Problem List   Diagnosis Date Noted   Possible pancreatic lesion 07/03/2022   Acute cholecystitis 07/01/2022   Hypotension 07/01/2022   Low back pain 03/29/2022   Hemarthrosis of right knee 06/30/2020   Heterotopic calcification, postoperative 06/30/2020   Total knee replacement status, right 03/22/2020   Hematospermia 11/18/2015   Epididymal cyst 05/25/2015   Parkinson disease ([place]) 01/14/2014   Obesity (BMI 30-39.9) 07/14/2013   Confusional arousals 02/13/2013   Disorder of ligament of right wrist 01/29/2012   Diabetes mellitus type 2, uncomplicated ([place]) 77/37/3668   Mixed hyperlipidemia 10/09/2011   Unilateral primary osteoarthritis, right knee 10/09/2011   Essential hypertension, benign 10/09/2011   PCP:  [name], PA Pharmacy:   [id number] [place], [address] [address]Phone: [phone]:: [phone]    Social Determinants of Health (SDOH) Interventions    Readmission Risk Interventions     No data to display

## 2022-07-03 NOTE — Evaluation (Signed)
Occupational Therapy Evaluation Patient Details Name: Danny [name] MRN: [id number] DOB: 12-Jan-1951 Today's Date: 07/03/2022   History of Present Illness Patient is 71 y.o. male presented to ED with acute RUQ pain no s/p cholecystectomy on 07/02/22. PMH significant for Rt TKA on 03/08/20, DM, Parkinsons, OA, GERD, HTN, HLD.   Clinical Impression   Patient is a 71 year old male who was admitted for above. Patient lives at home with wife who completes max A for ADLs at baseline. Patient reported being able to transfer himself to the bathroom most days with wife offering physical assistance to stand majority of time. Patient was min A +2 with RW to transfer from edge of bed to recliner in room with patient reporting that BLE felt weak and that they might give out. No shakiness or buckling noted on this date. Patient did need consistent safety cues with patient noted to let go with both hands when asked to reach back to the recliner. Patient indicated pain in R side of trunk from incisions up to R shoulder at rest and with movement. Patients wife and nurse in room during session. O2 saturation was 95% on RA and HR was 113bpm after transfer to recliner. Anticipate that patient will be able to progress to safely transition back home with wife and [facility] services at time of d/c from hospital.      Recommendations for follow up therapy are one component of a multi-disciplinary discharge planning process, led by the attending physician.  Recommendations may be updated based on patient status, additional functional criteria and insurance authorization.   Follow Up Recommendations  Home health OT    Assistance Recommended at Discharge Frequent or constant Supervision/Assistance  Patient can return home with the following A little help with walking and/or transfers;A little help with bathing/dressing/bathroom;Assistance with cooking/housework;Direct supervision/assist for financial management;Assist for  transportation;Help with stairs or ramp for entrance;Direct supervision/assist for medications management    Functional Status Assessment  Patient has had a recent decline in their functional status and demonstrates the ability to make significant improvements in function in a reasonable and predictable amount of time.  Equipment Recommendations  None recommended by OT    Recommendations for Other Services       Precautions / Restrictions Precautions Precautions: Fall Restrictions Weight Bearing Restrictions: No      Mobility Bed Mobility Overal bed mobility: Needs Assistance       Supine to sit: Max assist, HOB elevated     General bed mobility comments: patient required multimodal cues for rolling to side and pushing up into sitting on EOB. patient was able to sit on EOB with min guard with no leaning noted.    Transfers Overall transfer level: Needs assistance Equipment used: Rolling walker (2 wheels) Transfers: Sit to/from Stand Sit to Stand: Min assist, +2 safety/equipment           General transfer comment: to transfer from edge of bed to recliner with second person present for safety but providing no physical assist after patient reported feeling like legs were going to buckle      Balance Overall balance assessment: Needs assistance Sitting-balance support: Feet supported, Bilateral upper extremity supported Sitting balance-Leahy Scale: Fair     Standing balance support: During functional activity, Bilateral upper extremity supported, Reliant on assistive device for balance Standing balance-Leahy Scale: Poor  ADL either performed or assessed with clinical judgement   ADL Overall ADL's : Needs assistance/impaired Eating/Feeding: Moderate assistance Eating/Feeding Details (indicate cue type and reason): patients wife provides assistance for self feeding at home due to visual deficits. Grooming: Moderate  assistance;Sitting Grooming Details (indicate cue type and reason): wife completes these tasks for patient at home majority of the time per wife report. Upper Body Bathing: Maximal assistance;Sitting Upper Body Bathing Details (indicate cue type and reason): wife completes for patient at home Lower Body Bathing: Total assistance;Sitting/lateral leans Lower Body Bathing Details (indicate cue type and reason): wife completes for patient at home Upper Body Dressing : Moderate assistance;Sitting   Lower Body Dressing: Maximal assistance;Sitting/lateral leans;Sit to/from stand   Toilet Transfer: Minimal assistance;+2 for safety/equipment Toilet Transfer Details (indicate cue type and reason): patient reported feeling unsteady on BLE like they "were going to give out" but able to take few steps up the side of the bed. patient was also able to engage in transfer from edge of bed to recliner in room with RW with multimodal cues for proper hand and foot placement for transfers. Toileting- Clothing Manipulation and Hygiene: Maximal assistance;Sit to/from stand Toileting - Clothing Manipulation Details (indicate cue type and reason): patient was reliant on BUE support on RW for balance.             Vision Patient Visual Report: No change from baseline       Perception     Praxis      Pertinent Vitals/Pain Pain Assessment Pain Assessment: Faces Faces Pain Scale: Hurts even more Pain Location: R side of abdomen up to R shoulder Pain Descriptors / Indicators: Discomfort, Grimacing, Operative site guarding, Guarding Pain Intervention(s): Limited activity within patient's tolerance, Monitored during session, Repositioned     Hand Dominance Right   Extremity/Trunk Assessment Upper Extremity Assessment Upper Extremity Assessment: Generalized weakness (ROM WFL even with pain in R shoulder.)   Lower Extremity Assessment Lower Extremity Assessment: Defer to PT evaluation   Cervical / Trunk  Assessment Cervical / Trunk Assessment: Kyphotic   Communication Communication Communication: Expressive difficulties;HOH   Cognition Arousal/Alertness: Awake/alert Behavior During Therapy: Flat affect Overall Cognitive Status: History of cognitive impairments - at baseline                                 General Comments: wife was present to provide PLOF and encouragement     General Comments       Exercises     Shoulder Instructions      Home Living Family/patient expects to be discharged to:: Private residence Living Arrangements: Spouse/significant other Available Help at Discharge: Family Type of Home: House Home Access: Ramped entrance     Home Layout: One level     Bathroom Shower/Tub: Occupational psychologist: Handicapped height Bathroom Accessibility: Yes   Home Equipment: Conservation officer, nature (2 wheels);BSC/3in1;Wheelchair - manual;Shower seat   Additional Comments: lift chair (uses to raise up), pt is sleeping in regular bed      Prior Functioning/Environment Prior Level of Function : Needs assist       Physical Assist : ADLs (physical);Mobility (physical)   ADLs (physical): Grooming;Bathing;Dressing;Toileting;Feeding;IADLs Mobility Comments: Pt's wife assists with sit<>stand and pivot transfers to move bed<>wheelchair. They are waiting on lightweight wc for her to push pt in home and out of home. ADLs Comments: most of the time pt's wife assists him with sink bath he  used to get into walk in shower with her help. She assists with feeding as his vision limits his ability to feed himself. Wife does all ADL's for him.        OT Problem List: Decreased activity tolerance;Impaired balance (sitting and/or standing);Decreased safety awareness;Decreased knowledge of precautions;Decreased knowledge of use of DME or AE;Pain;Decreased coordination      OT Treatment/Interventions: Self-care/ADL training;Therapeutic exercise;Neuromuscular  education;Energy conservation;DME and/or AE instruction;Therapeutic activities;Balance training;Patient/family education    OT Goals(Current goals can be found in the care plan section) Acute Rehab OT Goals Patient Stated Goal: to get pain to go away OT Goal Formulation: With patient/family Time For Goal Achievement: 07/17/22 Potential to Achieve Goals: Fair  OT Frequency: Min 2X/week    Co-evaluation              AM-PAC OT "6 Clicks" Daily Activity     Outcome Measure   Help from another person taking care of personal grooming?: A Lot Help from another person toileting, which includes using toliet, bedpan, or urinal?: A Lot Help from another person bathing (including washing, rinsing, drying)?: A Lot Help from another person to put on and taking off regular upper body clothing?: A Lot Help from another person to put on and taking off regular lower body clothing?: A Lot 6 Click Score: 10   End of Session Equipment Utilized During Treatment: Gait belt;Rolling walker (2 wheels) Nurse Communication: Other (comment) (pain in R side to trunk and was present for portion of session)  Activity Tolerance: Patient tolerated treatment well Patient left: in chair;with call bell/phone within reach;with family/visitor present;with nursing/sitter in room  OT Visit Diagnosis: Unsteadiness on feet (R26.81);Other abnormalities of gait and mobility (R26.89);Muscle weakness (generalized) (M62.81);Pain                Time: 1510-1538 OT Time Calculation (min): 28 min Charges:  OT General Charges $OT Visit: 1 Visit OT Evaluation $OT Eval Moderate Complexity: 1 Mod OT Treatments $Therapeutic Activity: 8-22 mins  Jackelyn Poling OTR/L, MS Acute Rehabilitation Department Office# [phone] Pager# [phone]   Marcellina Millin 07/03/2022, 4:08 PM

## 2022-07-03 NOTE — Evaluation (Signed)
Physical Therapy Evaluation Patient Details Name: DEVEION DENZ MRN: [id number] DOB: [birth year] Today's Date: 07/03/2022  History of Present Illness  Patient is 71 y.o. male presented to ED with acute RUQ pain no s/p cholecystectomy on 07/02/22. PMH significant for Rt TKA on 03/08/20, DM, Parkinsons, OA, GERD, HTN, HLD.   Clinical Impression  JAASIEL HOLLYFIELD is 71 y.o. male admitted with above HPI and diagnosis. Patient is currently limited by functional impairments below (see PT problem list). Patient lives with his wife and requires Mod-Max assist for stand pivot transfers to move bed<>wc/chair and Max/Total assist for ADL's at baseline. Currently he requires Max assist for bed mobility and would require +2 assist for transfers. Patient will benefit from continued skilled PT interventions to address impairments and progress independence with mobility. Family wishes to take pt home and is interested in hospice care for increased assistance at home. Recommending maximize HH PT/OT with hospice care if pt is eligable. Acute PT will follow and progress as able.        Recommendations for follow up therapy are one component of a multi-disciplinary discharge planning process, led by the attending physician.  Recommendations may be updated based on patient status, additional functional criteria and insurance authorization.  PT Recommendation   Follow Up Recommendations Home health PT Filed 07/03/2022 4825  Assistance recommended at discharge Frequent or constant Supervision/Assistance Filed 07/03/2022 0037  Patient can return home with the following Two people to help with walking and/or transfers, Two people to help with bathing/dressing/bathroom, Assistance with cooking/housework, Assistance with feeding, Direct supervision/assist for medications management, Direct supervision/assist for financial management, Assist for transportation, Help with stairs or ramp for entrance [facility] 07/03/2022 0905   Functional Status Assessment Patient has had a recent decline in their functional status and/or demonstrates limited ability to make significant improvements in function in a reasonable and predictable amount of time [facility] 07/03/2022 [facility] bed [facility] 07/03/2022 0905    Mobility  Bed Mobility Overal bed mobility: Needs Assistance Bed Mobility: Rolling, Supine to Sit, Sit to Supine Rolling: Mod assist, Max assist   Supine to sit: Max assist, HOB elevated Sit to supine: Max assist, +2 for safety/equipment   General bed mobility comments: pt required multimodal cues and assist to initiate movement at bil LE's and to reach for bed rail. Max assist to rasie trunk upright and balcne at EOB. PT's wife provided min assist at back for support in sitting. Max+2 to control lowering trunk and bring bil LE's onto bed safely. 2+ to boost superior in bed.    Transfers                   General transfer comment: NT due to lethargy, pt fatigued quickly sitting EOB.    Ambulation/Gait                  Stairs            Wheelchair Mobility    Modified Rankin (Stroke Patients Only)       Balance Overall balance assessment: Needs assistance Sitting-balance support: Feet supported, Bilateral upper extremity supported Sitting balance-Leahy Scale: Poor   Postural control: Posterior lean                                   Pertinent Vitals/Pain Pain Assessment Pain Assessment: PAINAD Breathing: normal Negative Vocalization: none Facial Expression: smiling or inexpressive Body Language:  relaxed Consolability: no need to console PAINAD Score: 0 Pain Intervention(s): Monitored during session, Repositioned    Home Living                          Prior Function                       Hand Dominance        Extremity/Trunk Assessment                Communication      Cognition Arousal/Alertness:  Lethargic Behavior During Therapy: Flat affect Overall Cognitive Status: History of cognitive impairments - at baseline                   PT - End of Session  Activity Tolerance Patient limited by fatigue;Patient limited by lethargy  Patient left in bed;with call bell/phone within reach;with bed alarm set;with family/visitor present  Nurse Communication Mobility status  PT Assessment  PT Recommendation/Assessment Patient needs continued PT services  PT Visit Diagnosis Muscle weakness (generalized) (M62.81);Difficulty in walking, not elsewhere classified (R26.2);Other symptoms and signs involving the nervous system (R29.898)  PT Problem List Decreased strength;Decreased range of motion;Decreased activity tolerance;Decreased balance;Decreased mobility;Decreased knowledge of use of DME;Decreased knowledge of precautions  PT Plan  PT Frequency (ACUTE ONLY) Min 3X/week  PT Treatment/Interventions (ACUTE ONLY) DME instruction;Gait training;Stair training;Therapeutic activities;Functional mobility training;Therapeutic exercise;Balance training;Patient/family education;Neuromuscular re-education  AM-PAC PT "6 Clicks" Mobility Outcome Measure (Version 2)  Help needed turning from your back to your side while in a flat bed without using bedrails? 3  Help needed moving from lying on your back to sitting on the side of a flat bed without using bedrails? 3  Help needed moving to and from a bed to a chair (including a wheelchair)? 3  Help needed standing up from a chair using your arms (e.g., wheelchair or bedside chair)? 3  Help needed to walk in hospital room? 3  Help needed climbing 3-5 steps with a railing?  3  6 Click Score 18  Consider Recommendation of Discharge To: Home with [facility]  Progressive Mobility  What is the highest level of mobility based on the progressive mobility assessment? Level 2 (Chairfast) - Balance while sitting on edge of bed and cannot stand  Activity Dangled on edge of  bed  PT Recommendation  Follow Up Recommendations Home health PT  Assistance recommended at discharge Frequent or constant Supervision/Assistance  Patient can return home with the following Two people to help with walking and/or transfers;Two people to help with bathing/dressing/bathroom;Assistance with cooking/housework;Assistance with feeding;Direct supervision/assist for medications management;Direct supervision/assist for financial management;Assist for transportation;Help with stairs or ramp for entrance  Functional Status Assessment Patient has had a recent decline in their functional status and/or demonstrates limited ability to make significant improvements in function in a reasonable and predictable amount of time  PT equipment Hospital bed  Individuals Consulted  Consulted and Agree with Results and Recommendations Family member/caregiver  Family Member Consulted pt's wife  Acute Rehab PT Goals  PT Goal Formulation With patient  Time For Goal Achievement 07/17/22  Potential to Achieve Goals Fair  PT Time Calculation  PT Start Time (ACUTE ONLY) 0835  PT Stop Time (ACUTE ONLY) 0913  PT Time Calculation (min) (ACUTE ONLY) 38 min  PT General Charges  $$ ACUTE PT VISIT 1 Visit  PT Evaluation  $PT Eval Moderate Complexity 1 Mod  PT Treatments  $  Therapeutic Activity 23-37 mins  Written Expression  Dominant Hand Right    Verner Mould, DPT Acute Rehabilitation Services Office [phone] Pager [phone]  07/03/22 2:20 PM

## 2022-07-03 NOTE — Progress Notes (Signed)
Progress Note  1 Day Post-Op  Subjective: Pt lethargic today post-op but was able to work a little with PT earlier. Mild abdominal soreness as expected. No nausea or vomiting, tolerating liquids. Updated wife at bedside.   Objective: Vital signs in last 24 hours: Temp:  [97.3 F (36.3 C)-99.1 F (37.3 C)] 97.7 F (36.5 C) (07/18 1220) Pulse Rate:  [70-103] 70 (07/18 1220) Resp:  [16-20] 16 (07/18 1220) BP: (138-157)/(72-90) 138/72 (07/18 1220) SpO2:  [93 %-97 %] 97 % (07/18 1220) Last BM Date : 07/01/22  Intake/Output from previous day: 07/17 0701 - 07/18 0700 In: 3103.7 [P.O.:180; I.V.:2743.1; IV Piggyback:180.7] Out: 315 [Urine:300; Blood:15] Intake/Output this shift: Total I/O In: 120 [P.O.:120] Out: 300 [Urine:300]  PE: General: pleasant, WD, chronically ill appearing male who is laying in bed in NAD HEENT: Sclera are anicteric Heart: regular, rate, and rhythm.   Lungs:  Respiratory effort nonlabored Abd: soft, NT, ND, +BS, incisions C/D/I Psych: A&Ox3 with an appropriate affect.    Lab Results:  Recent Labs    07/02/22 0431 07/03/22 0518  WBC 10.6* 11.3*  HGB 13.1 12.7*  HCT 38.1* 38.1*  PLT 169 172   BMET Recent Labs    07/02/22 0431 07/03/22 0518  NA 139 143  K 3.6 3.9  CL 107 111  CO2 26 25  GLUCOSE 136* 134*  BUN 13 11  CREATININE 0.84 0.84  CALCIUM 9.0 9.0   PT/INR No results for input(s): "LABPROT", "INR" in the last 72 hours. CMP     Component Value Date/Time   NA 143 07/03/2022 0518   NA 138 04/16/2018 1005   K 3.9 07/03/2022 0518   CL 111 07/03/2022 0518   CO2 25 07/03/2022 0518   GLUCOSE 134 (H) 07/03/2022 0518   BUN 11 07/03/2022 0518   BUN 21 04/16/2018 1005   CREATININE 0.84 07/03/2022 0518   CREATININE 1.03 10/02/2016 1605   CALCIUM 9.0 07/03/2022 0518   PROT 6.3 (L) 07/03/2022 0518   PROT 6.8 04/16/2018 1005   ALBUMIN 3.2 (L) 07/03/2022 0518   ALBUMIN 4.3 04/16/2018 1005   AST 19 07/03/2022 0518   ALT 6  07/03/2022 0518   ALKPHOS 62 07/03/2022 0518   BILITOT 1.6 (H) 07/03/2022 0518   BILITOT 0.8 04/16/2018 1005   GFRNONAA >60 07/03/2022 0518   GFRNONAA 76 02/01/2015 1110   GFRAA >60 03/11/2020 0329   GFRAA 88 02/01/2015 1110   Lipase     Component Value Date/Time   LIPASE 29 07/01/2022 0724       Studies/Results: DG Cholangiogram Operative  Result Date: 07/02/2022 CLINICAL DATA:  Intraoperative cholangiogram during laparoscopic cholecystectomy. EXAM: INTRAOPERATIVE CHOLANGIOGRAM FLUOROSCOPY TIME:  9 seconds (2.5 mGy) COMPARISON:  Right upper quadrant abdominal ultrasound-07/01/2022; CT abdomen pelvis-07/01/2022 FINDINGS: Intraoperative cholangiographic images of the right upper abdominal quadrant during laparoscopic cholecystectomy are provided for review. Surgical clips overlie the expected location of the gallbladder fossa. Contrast injection demonstrates selective cannulation of the central aspect of the cystic duct. There is passage of contrast through the central aspect of the cystic duct with filling of a non dilated common bile duct. There is passage of contrast though the CBD and into the descending portion of the duodenum. There is minimal reflux of injected contrast into the common hepatic duct and central aspect of the non dilated intrahepatic biliary system. There is opacification of the central aspect of the pancreatic duct which appears dilated. There are no discrete filling defects within the opacified portions of the  biliary system to suggest the presence of choledocholithiasis. IMPRESSION: 1. No evidence of choledocholithiasis. 2. Opacification the central aspect of the pancreatic duct which appears dilated. Personal review of abdominal CT performed 07/01/2022 may suggest an ill-defined lesion at the level of the pancreatic head/neck with associated downstream pancreatic ductal dilatation and atrophy and as such further evaluation with abdominal MRI could be performed as  indicated. Electronically Signed   By: Sandi Mariscal M.D.   On: 07/02/2022 14:24   DG C-Arm 1-60 Min-No Report  Result Date: 07/02/2022 Fluoroscopy was utilized by the requesting physician.  No radiographic interpretation.    Anti-infectives: Anti-infectives (From admission, onward)    Start     Dose/Rate Route Frequency Ordered Stop   07/01/22 1700  piperacillin-tazobactam (ZOSYN) IVPB 3.375 g        3.375 g 12.5 mL/hr over 240 Minutes Intravenous Every 8 hours 07/01/22 1549     07/01/22 1600  piperacillin-tazobactam (ZOSYN) IVPB 3.375 g  Status:  Discontinued        3.375 g 100 mL/hr over 30 Minutes Intravenous Every 8 hours 07/01/22 1547 07/01/22 1549   07/01/22 1000  piperacillin-tazobactam (ZOSYN) IVPB 3.375 g        3.375 g 100 mL/hr over 30 Minutes Intravenous  Once 07/01/22 0955 07/01/22 1039        Assessment/Plan Acute cholecystitis  POD1 s/p laparoscopic cholecystectomy with IOC 07/02/22 Dr. Ninfa Linden - incisions C/D/I - advance to HH/CM diet - ok to stop abx from a surgical standpoint - mobilize as able - stable for discharge from a surgical standpoint when medically ready - follow up in AVS and instructions - irregularity of pancreas noted on IOC - will order outpatient MRI at follow up appointment. Discussed with patient's wife at bedside today  FEN: HH/CM diet, SLIV VTE: SQH, SCDs ID: [email or web address] 7/16>7/18  - below per TRH -  Parkinson's disease HTN HLD T2DM GERD  LOS: 2 days   I reviewed hospitalist notes, last 24 h vitals and pain scores, last 48 h intake and output, last 24 h labs and trends, and last 24 h imaging results.   Norm Parcel, [facility] Surgery 07/03/2022, 2:41 PM Please see Amion for pager number during day hours 7:00am-4:30pm

## 2022-07-04 DIAGNOSIS — G903 Multi-system degeneration of the autonomic nervous system: Secondary | ICD-10-CM | POA: Diagnosis not present

## 2022-07-04 DIAGNOSIS — E782 Mixed hyperlipidemia: Secondary | ICD-10-CM | POA: Diagnosis not present

## 2022-07-04 DIAGNOSIS — K810 Acute cholecystitis: Secondary | ICD-10-CM | POA: Diagnosis not present

## 2022-07-04 DIAGNOSIS — K869 Disease of pancreas, unspecified: Secondary | ICD-10-CM | POA: Diagnosis not present

## 2022-07-04 LAB — COMPREHENSIVE METABOLIC PANEL
ALT: 5 U/L (ref 0–44)
AST: 14 U/L — ABNORMAL LOW (ref 15–41)
Albumin: 2.9 g/dL — ABNORMAL LOW (ref 3.5–5.0)
Alkaline Phosphatase: 58 U/L (ref 38–126)
Anion gap: 7 (ref 5–15)
BUN: 13 mg/dL (ref 8–23)
CO2: 25 mmol/L (ref 22–32)
Calcium: 8.9 mg/dL (ref 8.9–10.3)
Chloride: 110 mmol/L (ref 98–111)
Creatinine, Ser: 0.87 mg/dL (ref 0.61–1.24)
GFR, Estimated: >60 mL/min (ref >60)
Glucose, Bld: 112 mg/dL — ABNORMAL HIGH (ref 70–99)
Potassium: 3.7 mmol/L (ref 3.5–5.1)
Sodium: 142 mmol/L (ref 135–145)
Total Bilirubin: 1.2 mg/dL (ref 0.3–1.2)
Total Protein: 6 g/dL — ABNORMAL LOW (ref 6.5–8.1)

## 2022-07-04 LAB — CBC
HCT: 36.8 % — ABNORMAL LOW (ref 39.0–52.0)
Hemoglobin: 12.2 g/dL — ABNORMAL LOW (ref 13.0–17.0)
MCH: 31.5 pg (ref 26.0–34.0)
MCHC: 33.2 g/dL (ref 30.0–36.0)
MCV: 95.1 fL (ref 80.0–100.0)
Platelets: 180 10*3/uL (ref 150–400)
RBC: 3.87 MIL/uL — ABNORMAL LOW (ref 4.22–5.81)
RDW: 14.5 % (ref 11.5–15.5)
WBC: 7.6 10*3/uL (ref 4.0–10.5)
nRBC: 0 % (ref 0.0–0.2)

## 2022-07-04 LAB — GLUCOSE, CAPILLARY
Glucose-Capillary: 117 mg/dL — ABNORMAL HIGH (ref 70–99)
Glucose-Capillary: 156 mg/dL — ABNORMAL HIGH (ref 70–99)
Glucose-Capillary: 156 mg/dL — ABNORMAL HIGH (ref 70–99)
Glucose-Capillary: 165 mg/dL — ABNORMAL HIGH (ref 70–99)

## 2022-07-04 MED ORDER — TRAMADOL HCL 50 MG PO TABS
50.0000 mg | ORAL_TABLET | Freq: Two times a day (BID) | ORAL | 0 refills | Status: DC | PRN
Start: 2022-07-04 — End: 2022-08-17

## 2022-07-04 NOTE — Plan of Care (Signed)
  Problem: Education: Goal: Knowledge of General Education information will improve Description Including pain rating scale, medication(s)/side effects and non-pharmacologic comfort measures Outcome: Progressing   Problem: Health Behavior/Discharge Planning: Goal: Ability to manage health-related needs will improve Outcome: Progressing   

## 2022-07-04 NOTE — Progress Notes (Signed)
Occupational Therapy Treatment Patient Details Name: Danny [name] MRN: [id number] DOB: 30-Sep-1951 Today's Date: 07/04/2022   History of present illness Patient is 71 y.o. male presented to ED with acute RUQ pain no s/p cholecystectomy on 07/02/22. PMH significant for Rt TKA on 03/08/20, DM, Parkinsons, OA, GERD, HTN, HLD.   OT comments  Patient was noted to have improved transfers on this date with min guard for functional mobility with RW from recliner in room to bathroom. Patients wife demonstrated the ability to assist patient with sit to stand from wheelchair with appropriate cues provided to patient during session. Patient needed cues to take larger steps. Patient's discharge plan remains appropriate at this time. OT will continue to follow acutely.     Recommendations for follow up therapy are one component of a multi-disciplinary discharge planning process, led by the attending physician.  Recommendations may be updated based on patient status, additional functional criteria and insurance authorization.    Follow Up Recommendations  Home health OT    Assistance Recommended at Discharge Frequent or constant Supervision/Assistance  Patient can return home with the following  A little help with walking and/or transfers;A little help with bathing/dressing/bathroom;Assistance with cooking/housework;Direct supervision/assist for financial management;Assist for transportation;Help with stairs or ramp for entrance;Direct supervision/assist for medications management   Equipment Recommendations  None recommended by OT    Recommendations for Other Services      Precautions / Restrictions Precautions Precautions: Fall Restrictions Weight Bearing Restrictions: No       Mobility Bed Mobility                    Transfers Overall transfer level: Needs assistance Equipment used: Rolling walker (2 wheels) Transfers: Sit to/from Stand Sit to Stand: Min guard                  Balance Overall balance assessment: Needs assistance Sitting-balance support: Feet supported, Bilateral upper extremity supported Sitting balance-Leahy Scale: Fair     Standing balance support: During functional activity, Bilateral upper extremity supported, Reliant on assistive device for balance Standing balance-Leahy Scale: Poor                             ADL either performed or assessed with clinical judgement   ADL Overall ADL's : Needs assistance/impaired                                       General ADL Comments: patient was min A to transfer out of recliner with RW with education on pushing up from chair. patient was able to take shuffle steps to bathroom door in room with min guard with RW. patients wife was able to cue patient for sit to stand from wheelchair with min guard with no posterior leaning noted. patietn was min guard with increased time to transfer from w/c to recliner in room with functional mobiltiy in between. patient was noted to be able to take larger steps with continued verbal cues and increased time on feet. patient and wife were educated on using BSC at home if needed. wife reported she had one available. patient's nurse and NT were educated on assist level on this date. nurse and NT verbalized understanding.    Extremity/Trunk Assessment              Vision  Perception     Praxis      Cognition Arousal/Alertness: Awake/alert Behavior During Therapy: Flat affect Overall Cognitive Status: History of cognitive impairments - at baseline                                 General Comments: wife was present durign session offering encouragement        Exercises      Shoulder Instructions       General Comments      Pertinent Vitals/ Pain       Pain Assessment Pain Assessment: Faces Faces Pain Scale: Hurts a little bit Pain Location: all over Pain Descriptors / Indicators: Discomfort,  Grimacing Pain Intervention(s): Limited activity within patient's tolerance, Monitored during session  Home Living                                          Prior Functioning/Environment              Frequency  Min 2X/week        Progress Toward Goals  OT Goals(current goals can now be found in the care plan section)  Progress towards OT goals: Progressing toward goals     Plan Discharge plan remains appropriate    Co-evaluation                 AM-PAC OT "6 Clicks" Daily Activity     Outcome Measure   Help from another person eating meals?: A Lot Help from another person taking care of personal grooming?: A Lot Help from another person toileting, which includes using toliet, bedpan, or urinal?: A Lot Help from another person bathing (including washing, rinsing, drying)?: A Lot Help from another person to put on and taking off regular upper body clothing?: A Lot Help from another person to put on and taking off regular lower body clothing?: A Lot 6 Click Score: 12    End of Session Equipment Utilized During Treatment: Gait belt;Rolling walker (2 wheels)  OT Visit Diagnosis: Unsteadiness on feet (R26.81);Other abnormalities of gait and mobility (R26.89);Muscle weakness (generalized) (M62.81);Pain   Activity Tolerance Patient tolerated treatment well   Patient Left in chair;with call bell/phone within reach;with family/visitor present;with nursing/sitter in room   Nurse Communication Other (comment) (participation in session)        Time: 1607-3710 OT Time Calculation (min): 19 min  Charges: OT General Charges $OT Visit: 1 Visit OT Treatments $Self Care/Home Management : 8-22 mins  Jackelyn Poling OTR/L, MS Acute Rehabilitation Department Office# [phone] Pager# [phone]   Marcellina Millin 07/04/2022, 1:27 PM

## 2022-07-04 NOTE — Progress Notes (Signed)
Progress Note   Patient: Danny [name] FUX:[id number] DOB: 1951/06/12 DOA: 07/01/2022     3 DOS: the patient was seen and examined on 07/04/2022 at 1:05PM      Brief hospital course: Mr. Catterton is a 71 y.o. M with Parkinson's disease, c/b orthostatic hypotension and hallucinations and DM who presented with acute epigastric pain.  Patient was in normal state of health until night before admission when he developed epigastric pain, malaise.  Pain persisted overnight, did not go away, and in the morning he woke up feeling like he could not breathe suddenly so wife called EMS.  In the ER he was hypotensive, WBC 13 K, lactate 2.9.  He was given fluids and the blood pressure normalized and lactate improved.  CT angiogram of the chest rule out PE.  KG normal.  CT of the abdomen and pelvis however showed cholecystitis and pancreatitis so is given antibiotics IV fluids and admitted for acute cholecystitis     Assessment and Plan: * Acute cholecystitis P/w RUQ pain on exam and CT and US imaging showing pericholecystic fluid and stranding. No stones visualized.  General Surgery consulted and took to OR for cholecystectomy 7/17, findings confirmed cholecystitis.  - Stop antibiotics -Tolerating regular diet - Pain control and PT     Possible pancreatic lesion At admission, CT concerning for "possible pancreatitis."  Lipase normal and patient without symptoms distinguishable from cholecystitis.    Radiology reviewed intraoperative cholangioram and were concerned he may have a pancreatic lesion with associated down stream pancreatic ductal dilation and atrophy - General Surgery will order outpatient MRI abdomen to follow up  Hypotension Chronic hypotension due to Parkinson's disease - Continue Florinef  Parkinson disease ([place]) Complicated by memory loss, hypotension, hallucinations, recurrent falls, freezing, constipation and dyskinesias  He had mild sundowning his first night, but  no frank encephalopathy yet. - Continue Sinemet - Continue Florinef - PT    Mixed hyperlipidemia - Continue simvastatin  Diabetes mellitus type 2, uncomplicated (HCC) - Hold metformin - SS correction insulin          Subjective: Mild abdominal pain when coughing.  He did have a bowel movement today.  P.o. intake has been adequate.     Physical Exam: Vitals:   07/03/22 1220 07/03/22 2116 07/04/22 0548 07/04/22 1218  BP: 138/72 (!) 141/85 106/70 103/73  Pulse: 70 75 89 91  Resp: '16 15 19 18  '$ Temp: 97.7 F (36.5 C) 97.8 F (36.6 C) 98.4 F (36.9 C) 98.4 F (36.9 C)  TempSrc: Axillary Oral Oral Oral  SpO2: 97% 96% 95% 96%  Weight:      Height:       Elderly adult male, lying in bed, neck pillow in place, being fed lunch by his wife RRR, no murmurs, no peripheral edema Respiratory rate normal, lungs clear without rales or wheezes Abdomen some mild nonfocal tenderness, no rigidity, no guarding Attention somewhat distracted, affect blunted, bradykinesia noted, he appears severely generally weak, barely able to lift his head, speech hypophonic and somewhat slurred, similar to baseline, mild right facial droop, similar to baseline.    Data Reviewed: Hemogram notable for white blood cell count normal at 7.6, hemoglobin stable at 02.5 Basic metabolic panel with normal sodium, potassium, creatinine LFT showed total bilirubin down to 1.2  Family Communication: Wife at the bedside    Disposition: Status is: Inpatient The patient was admitted with cholecystitis.  Plan is to discharge home with home health services.  Hospital bed to be  delivered to patient's home.  Wife says she has to move around some furniture today to ensure that bed can be set up at home.  We will plan on discharge home in a.m. via [facility]AR             Author: Kathie Dike, MD 07/04/2022 2:48 PM  For on call review [email or web address].

## 2022-07-04 NOTE — Anesthesia Postprocedure Evaluation (Signed)
Anesthesia Post Note  Patient: Danny [name]  Procedure(s) Performed: LAPAROSCOPIC CHOLECYSTECTOMY WITH ICG DYE  INTRAOPERATIVE CHOLANGIOGRAM (Abdomen)     Patient location during evaluation: PACU Anesthesia Type: General Level of consciousness: awake and alert Pain management: pain level controlled Vital Signs Assessment: post-procedure vital signs reviewed and stable Respiratory status: spontaneous breathing, nonlabored ventilation, respiratory function stable and patient connected to nasal cannula oxygen Cardiovascular status: blood pressure returned to baseline and stable Postop Assessment: no apparent nausea or vomiting Anesthetic complications: no   No notable events documented.  Last Vitals:  Vitals:   07/03/22 1220 07/03/22 2116  BP: 138/72 (!) 141/85  Pulse: 70 75  Resp: 16 15  Temp: 36.5 C 36.6 C  SpO2: 97% 96%    Last Pain:  Vitals:   07/03/22 2116  TempSrc: Oral  PainSc:                  Tiajuana Amass

## 2022-07-04 NOTE — TOC Transition Note (Signed)
Transition of Care [facility]) - CM/SW Discharge Note   Patient Details  Name: Danny [name] MRN: [id number] Date of Birth: [birth year]  Transition [facility] Care [facility]) CM/SW Contact:  Dessa Phi, RN Phone Number: 07/04/2022, 11:01 AM   Clinical Narrative: spoke to spouse Judy-agree to d/c home [facility] PT/OT;[facility] to deliver hospital bed today-paitent agree to d/c home prior hospital delivery to home. Confirmed address for PTAR. No further CM needs.      Final next level of care: [place] Barriers to Discharge: No Barriers Identified   Patient Goals and CMS Choice Patient states their goals for this hospitalization and ongoing recovery are:: Home CMS Medicare.gov Compare Post Acute Care list provided to:: Patient Represenative (must comment) [name] spouse) Choice offered to / list presented to : Spouse  Discharge Placement                Patient to be transferred to facility by:  Corey Harold) Name of family member notified:  (Judy spouse) Patient and family notified of of transfer: 07/04/22  Discharge Plan and Services   Discharge Planning Services: CM Consult Post Acute Care Choice: Home Health          DME Arranged: Hospital bed DME Agency: [employer] Date DME Agency Contacted: 07/03/22 Time DME Agency Contacted: [phone] Representative spoke with at DME Agency: [place]: PT, OT [place] Agency: [place] ([place]) Date [place]: 07/03/22 Time [place]: 1559 Representative spoke with at [place]: [place] ([place]) Interventions     Readmission Risk Interventions     No data to display

## 2022-07-04 NOTE — Progress Notes (Signed)
Progress Note  2 Days Post-Op  Subjective: Up in chair this AM. Tolerating diet. Some mild abdominal soreness. Still very tired but more alert today. Updated wife at bedside.   Objective: Vital signs in last 24 hours: Temp:  [97.7 F (36.5 C)-98.4 F (36.9 C)] 98.4 F (36.9 C) (07/19 0548) Pulse Rate:  [70-89] 89 (07/19 0548) Resp:  [15-19] 19 (07/19 0548) BP: (106-141)/(70-85) 106/70 (07/19 0548) SpO2:  [95 %-97 %] 95 % (07/19 0548) Last BM Date : 07/01/22  Intake/Output from previous day: 07/18 0701 - 07/19 0700 In: 653 [P.O.:600; I.V.:3; IV Piggyback:50] Out: 1450 [Urine:1450] Intake/Output this shift: No intake/output data recorded.  PE: General: pleasant, WD, chronically ill appearing male who is up in chair and in NAD HEENT: Sclera are anicteric Heart: regular, rate, and rhythm.   Lungs:  Respiratory effort nonlabored Abd: soft, NT, ND, +BS, incisions C/D/I Psych: A&Ox3 with an appropriate affect.    Lab Results:  Recent Labs    07/03/22 0518 07/04/22 0541  WBC 11.3* 7.6  HGB 12.7* 12.2*  HCT 38.1* 36.8*  PLT 172 180    BMET Recent Labs    07/03/22 0518 07/04/22 0541  NA 143 142  K 3.9 3.7  CL 111 110  CO2 25 25  GLUCOSE 134* 112*  BUN 11 13  CREATININE 0.84 0.87  CALCIUM 9.0 8.9    PT/INR No results for input(s): "LABPROT", "INR" in the last 72 hours. CMP     Component Value Date/Time   NA 142 07/04/2022 0541   NA 138 04/16/2018 1005   K 3.7 07/04/2022 0541   CL 110 07/04/2022 0541   CO2 25 07/04/2022 0541   GLUCOSE 112 (H) 07/04/2022 0541   BUN 13 07/04/2022 0541   BUN 21 04/16/2018 1005   CREATININE 0.87 07/04/2022 0541   CREATININE 1.03 10/02/2016 1605   CALCIUM 8.9 07/04/2022 0541   PROT 6.0 (L) 07/04/2022 0541   PROT 6.8 04/16/2018 1005   ALBUMIN 2.9 (L) 07/04/2022 0541   ALBUMIN 4.3 04/16/2018 1005   AST 14 (L) 07/04/2022 0541   ALT 5 07/04/2022 0541   ALKPHOS 58 07/04/2022 0541   BILITOT 1.2 07/04/2022 0541    BILITOT 0.8 04/16/2018 1005   GFRNONAA >60 07/04/2022 0541   GFRNONAA 76 02/01/2015 1110   GFRAA >60 03/11/2020 0329   GFRAA 88 02/01/2015 1110   Lipase     Component Value Date/Time   LIPASE 29 07/01/2022 0724       Studies/Results: DG Cholangiogram Operative  Result Date: 07/02/2022 CLINICAL DATA:  Intraoperative cholangiogram during laparoscopic cholecystectomy. EXAM: INTRAOPERATIVE CHOLANGIOGRAM FLUOROSCOPY TIME:  9 seconds (2.5 mGy) COMPARISON:  Right upper quadrant abdominal ultrasound-07/01/2022; CT abdomen pelvis-07/01/2022 FINDINGS: Intraoperative cholangiographic images of the right upper abdominal quadrant during laparoscopic cholecystectomy are provided for review. Surgical clips overlie the expected location of the gallbladder fossa. Contrast injection demonstrates selective cannulation of the central aspect of the cystic duct. There is passage of contrast through the central aspect of the cystic duct with filling of a non dilated common bile duct. There is passage of contrast though the CBD and into the descending portion of the duodenum. There is minimal reflux of injected contrast into the common hepatic duct and central aspect of the non dilated intrahepatic biliary system. There is opacification of the central aspect of the pancreatic duct which appears dilated. There are no discrete filling defects within the opacified portions of the biliary system to suggest the presence of choledocholithiasis. IMPRESSION:  1. No evidence of choledocholithiasis. 2. Opacification the central aspect of the pancreatic duct which appears dilated. Personal review of abdominal CT performed 07/01/2022 may suggest an ill-defined lesion at the level of the pancreatic head/neck with associated downstream pancreatic ductal dilatation and atrophy and as such further evaluation with abdominal MRI could be performed as indicated. Electronically Signed   By: Sandi Mariscal M.D.   On: 07/02/2022 14:24   DG C-Arm  1-60 Min-No Report  Result Date: 07/02/2022 Fluoroscopy was utilized by the requesting physician.  No radiographic interpretation.    Anti-infectives: Anti-infectives (From admission, onward)    Start     Dose/Rate Route Frequency Ordered Stop   07/01/22 1700  piperacillin-tazobactam (ZOSYN) IVPB 3.375 g  Status:  Discontinued        3.375 g 12.5 mL/hr over 240 Minutes Intravenous Every 8 hours 07/01/22 1549 07/03/22 1447   07/01/22 1600  piperacillin-tazobactam (ZOSYN) IVPB 3.375 g  Status:  Discontinued        3.375 g 100 mL/hr over 30 Minutes Intravenous Every 8 hours 07/01/22 1547 07/01/22 1549   07/01/22 1000  piperacillin-tazobactam (ZOSYN) IVPB 3.375 g        3.375 g 100 mL/hr over 30 Minutes Intravenous  Once 07/01/22 0955 07/01/22 1039        Assessment/Plan Acute cholecystitis  POD2 s/p laparoscopic cholecystectomy with IOC 07/02/22 Dr. Ninfa Linden - incisions C/D/I - advance to HH/CM diet - ok to stop abx from a surgical standpoint - mobilize as able - stable for discharge from a surgical standpoint when medically ready - follow up in AVS and instructions - irregularity of pancreas noted on IOC - will order outpatient MRI at follow up appointment.   FEN: HH/CM diet, SLIV VTE: SQH, SCDs ID: Zosyn 7/16>7/18  - below per TRH -  Parkinson's disease HTN HLD T2DM GERD  LOS: 3 days     Norm Parcel, [facility] Surgery 07/04/2022, 10:44 AM Please see Amion for pager number during day hours 7:00am-4:30pm

## 2022-07-04 NOTE — Progress Notes (Signed)
Physical Therapy Treatment Patient Details Name: Danny [name] MRN: [id number] DOB: 12-20-1950 Today's Date: 07/04/2022   History of Present Illness Patient is 71 y.o. male presented to ED with acute RUQ pain no s/p cholecystectomy on 07/02/22. PMH significant for Rt TKA on 03/08/20, DM, Parkinsons, OA, GERD, HTN, HLD.    PT Comments    General Comments: AxO x 2 pleasant, following all commands with increased time.  VERY supportive Wife who provides 24/7 care.  "His Parkinson's is advancing", stated spouse.  Assisted OOB to amb.  General bed mobility comments: "this is how we do it at home".  Spouse assists by pulling B LE off bed then pulls B UE's for upper body.  Pt rigid throughout.  Posterior lean.  Vernie Shanks. General transfer comment: required increased assist this pm session present with posterior lean and rigidity.  Spouse having PTAR transport back home, "getting him in and out of the car would be impossible for me". General Gait Details: Typical "Parkinson's Gait" requiring Mod Assist to prevent posterior LOB.  Pt was able to amb a funactional distance 22 feet however VERY unsteady esp with turns and back steps to bed.  HIGH FALL RISK.  Spouse stated, "I usually have my transport chair follow cause his knees give way".  Pt plans to D/C to home tomorrow after hospital bed is delivered.    Recommendations for follow up therapy are one component of a multi-disciplinary discharge planning process, led by the attending physician.  Recommendations may be updated based on patient status, additional functional criteria and insurance authorization.  Follow Up Recommendations  Home health PT     Assistance Recommended at Discharge Frequent or constant Supervision/Assistance  Patient can return home with the following Two people to help with walking and/or transfers;Two people to help with bathing/dressing/bathroom;Assistance with cooking/housework;Assistance with feeding;Direct  supervision/assist for medications management;Direct supervision/assist for financial management;Assist for transportation;Help with stairs or ramp for entrance   Equipment Recommendations  Hospital bed    Recommendations for Other Services       Precautions / Restrictions Precautions Precautions: Fall Precaution Comments: Parkinsons Restrictions Weight Bearing Restrictions: No     Mobility  Bed Mobility Overal bed mobility: Needs Assistance Bed Mobility: Supine to Sit, Sit to Supine     Supine to sit: Max assist, HOB elevated Sit to supine: Max assist   General bed mobility comments: "this is how we do it at home".  Spouse assists by pulling B LE off bed then pulls B UE's for upper body.  Pt rigid throughout.  Posterior lean.  Vernie Shanks.    Transfers Overall transfer level: Needs assistance Equipment used: Rolling walker (2 wheels) Transfers: Sit to/from Stand Sit to Stand: Mod assist           General transfer comment: required increased assist this pm session present with posterior lean and rigidity.  Spouse having PTAR transport back home, "getting him in and out of the car would be impossible for me".    Ambulation/Gait Ambulation/Gait assistance: Mod assist Gait Distance (Feet): 24 Feet Assistive device: Rolling walker (2 wheels) Gait Pattern/deviations: Step-to pattern, Festinating, Trunk flexed, Narrow base of support, Decreased step length - right, Decreased step length - left Gait velocity: decreased     General Gait Details: Typical "Parkinson's Gait" requiring Mod Assist to prevent posterior LOB.  Pt was able to amb a funactional distance 22 feet however VERY unsteady esp with turns and back steps to bed.  HIGH FALL RISK.  Stairs             Wheelchair Mobility    Modified Rankin (Stroke Patients Only)       Balance                                            Cognition Arousal/Alertness: Awake/alert Behavior  During Therapy: WFL for tasks assessed/performed Overall Cognitive Status: History of cognitive impairments - at baseline                                 General Comments: AxO x 2 pleasant, following all commands with increased time.  VERY supportive Wife        Exercises      General Comments        Pertinent Vitals/Pain Pain Assessment Pain Assessment: Faces Faces Pain Scale: Hurts a little bit Pain Location: ABD (recent surgery) Pain Descriptors / Indicators: Discomfort, Grimacing    Home Living                          Prior Function            PT Goals (current goals can now be found in the care plan section) Progress towards PT goals: Progressing toward goals    Frequency    Min 3X/week      PT Plan Current plan remains appropriate    Co-evaluation              AM-PAC PT "6 Clicks" Mobility   Outcome Measure  Help needed turning from your back to your side while in a flat bed without using bedrails?: A Lot Help needed moving from lying on your back to sitting on the side of a flat bed without using bedrails?: A Lot Help needed moving to and from a bed to a chair (including a wheelchair)?: A Lot Help needed standing up from a chair using your arms (e.g., wheelchair or bedside chair)?: A Lot Help needed to walk in hospital room?: A Lot Help needed climbing 3-5 steps with a railing? : A Lot 6 Click Score: 12    End of Session Equipment Utilized During Treatment: Gait belt Activity Tolerance: Patient tolerated treatment well Patient left: in bed;with call bell/phone within reach;with bed alarm set;with family/visitor present Nurse Communication: Mobility status PT Visit Diagnosis: Muscle weakness (generalized) (M62.81);Difficulty in walking, not elsewhere classified (R26.2);Other symptoms and signs involving the nervous system (R29.898)     Time: 2202-5427 PT Time Calculation (min) (ACUTE ONLY): 17 min  Charges:  $Gait  Training: 8-22 mins                     Rica Koyanagi  PTA [place] Office M-F          [phone] Weekend pager [phone]

## 2022-07-05 DIAGNOSIS — K810 Acute cholecystitis: Secondary | ICD-10-CM | POA: Diagnosis not present

## 2022-07-05 DIAGNOSIS — K8020 Calculus of gallbladder without cholecystitis without obstruction: Secondary | ICD-10-CM | POA: Diagnosis not present

## 2022-07-05 DIAGNOSIS — G903 Multi-system degeneration of the autonomic nervous system: Secondary | ICD-10-CM | POA: Diagnosis not present

## 2022-07-05 DIAGNOSIS — E119 Type 2 diabetes mellitus without complications: Secondary | ICD-10-CM | POA: Diagnosis not present

## 2022-07-05 LAB — GLUCOSE, CAPILLARY
Glucose-Capillary: 132 mg/dL — ABNORMAL HIGH (ref 70–99)
Glucose-Capillary: 167 mg/dL — ABNORMAL HIGH (ref 70–99)

## 2022-07-05 NOTE — Discharge Summary (Signed)
Physician Discharge Summary  Danny [name] PYK:[id number] DOB: 28-Jun-1951 DOA: 07/01/2022  PCP: Danny Mons, PA  Admit date: 07/01/2022 Discharge date: 07/05/2022  Admitted From: Home Disposition: Home  Recommendations for Outpatient Follow-up:  Follow up with PCP in 1-2 weeks Please obtain BMP/CBC in one week Patient plans to follow-up with general surgery and consider outpatient MRI for further evaluation of pancreas  Home Health: Home health PT, OT Equipment/Devices: Hospital bed  Discharge Condition: Stable CODE STATUS: DNR Diet recommendation: Heart healthy, carb modified  Brief/Interim Summary: Danny [name] is a 71 y.o. M with Parkinson's disease, c/b orthostatic hypotension and hallucinations and DM who presented with acute epigastric pain.   Patient was in normal state of health until night before admission when he developed epigastric pain, malaise.  Pain persisted overnight, did not go away, and in the morning he woke up feeling like he could not breathe suddenly so wife called EMS.  In the ER he was hypotensive, WBC 13 K, lactate 2.9.  He was given fluids and the blood pressure normalized and lactate improved.  CT angiogram of the chest rule out PE.  KG normal.  CT of the abdomen and pelvis however showed cholecystitis and pancreatitis so is given antibiotics IV fluids and admitted for acute cholecystitis  Discharge Diagnoses:  Principal Problem:   Acute cholecystitis Active Problems:   Diabetes mellitus type 2, uncomplicated ([place])   Mixed hyperlipidemia   Parkinson disease ([place])   Hypotension   Possible pancreatic lesion  Acute cholecystitis P/w RUQ pain on exam and CT and US imaging showing pericholecystic fluid and stranding. No stones visualized.  General Surgery consulted and took to OR for cholecystectomy 7/17, findings confirmed cholecystitis.   - Stop antibiotics -Tolerating regular diet - Pain control and PT         Possible pancreatic  lesion At admission, CT concerning for "possible pancreatitis."  Lipase normal and patient without symptoms distinguishable from cholecystitis.     Radiology reviewed intraoperative cholangioram and were concerned he may have a pancreatic lesion with associated down stream pancreatic ductal dilation and atrophy - General Surgery will order outpatient MRI abdomen to follow up   Hypotension Chronic hypotension due to Parkinson's disease - Continue Florinef   Parkinson disease ([place]) Complicated by memory loss, hypotension, hallucinations, recurrent falls, freezing, constipation and dyskinesias   He had mild sundowning his first night, but no frank encephalopathy yet. - Continue Sinemet - Continue Florinef - PT      Mixed hyperlipidemia - Continue simvastatin   Diabetes mellitus type 2, uncomplicated ([place]) - Resume outpatient regimen on discharge  Discharge Instructions  Discharge Instructions     Diet - low sodium heart healthy   Complete by: As directed    Increase activity slowly   Complete by: As directed       Allergies as of 07/05/2022       Reactions   Codeine Nausea And Vomiting        Medication List     TAKE these medications    aspirin EC 81 MG tablet Take 81 mg by mouth daily. Swallow whole.   carbidopa-levodopa 25-100 MG tablet Commonly known as: SINEMET IR TAKE 1 TABLET BY MOUTH 6 (SIX) TIMES DAILY. What changed:  when to take this additional instructions   carbidopa-levodopa 50-200 MG tablet Commonly known as: SINEMET CR TAKE 1 TABLET BY MOUTH EVERYDAY AT BEDTIME What changed: See the new instructions.   Cinnamon 500 MG capsule Take 500 mg by mouth daily.  famotidine 20 MG tablet Commonly known as: PEPCID Take 20 mg by mouth in the morning and at bedtime.   fludrocortisone 0.1 MG tablet Commonly known as: FLORINEF Take 1 tablet (0.1 mg total) by mouth daily. What changed: when to take this   Ginger (Zingiber officinalis) 550 MG  Caps Take 550 mg by mouth daily.   GLUCOSAMINE CHONDR 1500 COMPLX PO Take 1,500 mg by mouth daily.   metFORMIN 1000 MG tablet Commonly known as: GLUCOPHAGE Take 1 tablet (1,000 mg total) by mouth 2 (two) times daily with a meal.   multivitamin tablet Take 1 tablet by mouth daily with breakfast.   QUEtiapine 50 MG Tb24 24 hr tablet Commonly known as: SEROQUEL XR Take 50 mg by mouth at bedtime. What changed: Another medication with the same name was removed. Continue taking this medication, and follow the directions you see here.   simvastatin 20 MG tablet Commonly known as: ZOCOR Take 20 mg by mouth every Monday, Wednesday, and Friday at 6 PM.   traMADol 50 MG tablet Commonly known as: ULTRAM Take 1 tablet (50 mg total) by mouth every 12 (twelve) hours as needed for moderate pain or severe pain.   VITAMIN C ADULT GUMMIES PO Take 1 tablet by mouth daily.               Durable Medical Equipment  (From admission, onward)           Start     Ordered   07/03/22 1557  For home use only DME Hospital bed  Once       Question Answer Comment  Length of Need 6 Months   Patient has (list medical condition): Parkinson's disease   Bed type Semi-electric      07/03/22 1557            Follow-up Information     Coralie Keens, MD. Schedule an appointment as soon as possible for a visit in 3 week(s).   Specialty: General Surgery Why: Our office is working on scheduling a follow up in about 3 weeks, please call to confirm appointment date/time. Please arrive 30 min prior to appointment time. Bring ID and insurance information with you. Contact information: [address] [place] [address] [phone]         [facility] Follow up.   Why: [facility] physical/occupational therapy Contact information: [place] [address] [facility] Follow up.   Why: hospital  bed Contact information: [address] [place] [address] [phone]                Allergies  Allergen Reactions   Codeine Nausea And Vomiting    Consultations: General surgery   Procedures/Studies: DG Cholangiogram Operative  Result Date: 07/02/2022 CLINICAL DATA:  Intraoperative cholangiogram during laparoscopic cholecystectomy. EXAM: INTRAOPERATIVE CHOLANGIOGRAM FLUOROSCOPY TIME:  9 seconds (2.5 mGy) COMPARISON:  Right upper quadrant abdominal ultrasound-07/01/2022; CT abdomen pelvis-07/01/2022 FINDINGS: Intraoperative cholangiographic images of the right upper abdominal quadrant during laparoscopic cholecystectomy are provided for review. Surgical clips overlie the expected location of the gallbladder fossa. Contrast injection demonstrates selective cannulation of the central aspect of the cystic duct. There is passage of contrast through the central aspect of the cystic duct with filling of a non dilated common bile duct. There is passage of contrast though the CBD and into the descending portion of the duodenum. There is minimal  reflux of injected contrast into the common hepatic duct and central aspect of the non dilated intrahepatic biliary system. There is opacification of the central aspect of the pancreatic duct which appears dilated. There are no discrete filling defects within the opacified portions of the biliary system to suggest the presence of choledocholithiasis. IMPRESSION: 1. No evidence of choledocholithiasis. 2. Opacification the central aspect of the pancreatic duct which appears dilated. Personal review of abdominal CT performed 07/01/2022 may suggest an ill-defined lesion at the level of the pancreatic head/neck with associated downstream pancreatic ductal dilatation and atrophy and as such further evaluation with abdominal MRI could be performed as indicated. Electronically Signed   By: Sandi Mariscal M.D.   On: 07/02/2022 14:24   DG C-Arm 1-60 Min-No  Report  Result Date: 07/02/2022 Fluoroscopy was utilized by the requesting physician.  No radiographic interpretation.   US Abdomen Limited RUQ (LIVER/GB)  Result Date: 07/01/2022 CLINICAL DATA:  71 year old male with history of right upper quadrant abdominal pain. Abnormal appearance of the gallbladder on recent CT examination. Follow-up study. Abdominal ultrasound no prior right upper quadrant ultrasound. CT the abdomen and pelvis 07/01/2022. EXAM: ULTRASOUND ABDOMEN LIMITED RIGHT UPPER QUADRANT COMPARISON:  None Available. FINDINGS: Gallbladder: No definite gallstones. Gallbladder is only moderately distended. Gallbladder wall appears thickened and edematous measuring up to 5 mm. Trace amount of pericholecystic fluid. Per report from the sonographer, there was no sonographic Murphy's sign on examination. Common bile duct: Diameter: 2 mm Liver: No focal lesion identified. Within normal limits in parenchymal echogenicity. Portal vein is patent on color Doppler imaging with normal direction of blood flow towards the liver. Other: None. IMPRESSION: 1. Gallbladder wall appears thickened and edematous, and there is a trace amount of pericholecystic fluid. However, there are no gallstones, and there was no sonographic Murphy's sign on examination. Overall, findings are considered equivocal, but not strongly suggestive of acute cholecystitis in the absence of a sonographic Murphy's sign. If there is clinical concern for acute acalculous cholecystitis, further evaluation with nuclear medicine hepatobiliary scan could be considered. Electronically Signed   By: Vinnie Langton M.D.   On: 07/01/2022 11:34   CT Abdomen Pelvis W Contrast  Result Date: 07/01/2022 CLINICAL DATA:  Pulmonary embolism (PE) suspected, high prob; Sepsis EXAM: CT ANGIOGRAPHY CHEST CT ABDOMEN AND PELVIS WITH CONTRAST TECHNIQUE: Multidetector CT imaging of the chest was performed using the standard protocol during bolus administration of  intravenous contrast. Multiplanar CT image reconstructions and MIPs were obtained to evaluate the vascular anatomy. Multidetector CT imaging of the abdomen and pelvis was performed using the standard protocol during bolus administration of intravenous contrast. RADIATION DOSE REDUCTION: This exam was performed according to the departmental dose-optimization program which includes automated exposure control, adjustment of the mA and/or kV according to patient size and/or use of iterative reconstruction technique. CONTRAST:  125m OMNIPAQUE IOHEXOL 350 MG/ML SOLN COMPARISON:  None Available. FINDINGS: CTA CHEST FINDINGS Cardiovascular: Satisfactory opacification of the pulmonary arteries to the segmental level. No evidence of pulmonary embolism. Thoracic aorta is normal in course and caliber. Scattered atherosclerotic calcifications of the aorta and coronary arteries. Normal heart size. No pericardial effusion. Mediastinum/Nodes: No enlarged mediastinal, hilar, or axillary lymph nodes. Thyroid gland, trachea, and esophagus demonstrate no significant findings. Lungs/Pleura: Mild bibasilar subsegmental atelectasis. Lungs are otherwise clear. No pleural effusion or pneumothorax. Musculoskeletal: No chest wall abnormality. No acute or significant osseous findings. Review of the MIP images confirms the above findings. CT ABDOMEN and PELVIS FINDINGS Hepatobiliary: Unremarkable appearance  of the liver. No focal liver abnormality. Mild-moderate gallbladder distension with diffuse gallbladder wall thickening and pericholecystic fluid/stranding. No intrahepatic biliary dilatation Pancreas: Edematous appearance of the pancreatic body and tail with mild peripancreatic fat stranding and trace free fluid. No organized peripancreatic fluid collection. There is a short segment of mild pancreatic ductal dilatation within the pancreatic tail. Pancreatic tail is somewhat truncated. Spleen: Normal in size without focal abnormality.  Adrenals/Urinary Tract: Unremarkable adrenal glands. Two adjacent simple cysts within the upper pole the left kidney which do not require follow-up. Kidneys appear otherwise unremarkable with symmetric enhancement. No renal stone or hydronephrosis. Urinary bladder is within normal limits. Stomach/Bowel: Stomach is within normal limits. Appendix appears normal (series 2, image 61). Colon is somewhat redundant. No evidence of bowel wall thickening, distention, or inflammatory changes. Moderate volume of stool within the colon Vascular/Lymphatic: Scattered aortoiliac atherosclerotic calcifications without aneurysm. No abdominopelvic lymphadenopathy. Slightly increased attenuation within the central mesentery with several small mesenteric lymph nodes. Reproductive: Prostate is unremarkable. Other: No ascites. No abdominopelvic fluid collection. No pneumoperitoneum. No abdominal wall hernia. Musculoskeletal: No acute or significant osseous findings. Several old healed right-sided rib fractures. Review of the MIP images confirms the above findings. IMPRESSION: 1. No evidence of pulmonary embolism or other acute cardiopulmonary process. 2. Mild-moderate gallbladder distension with diffuse gallbladder wall thickening and pericholecystic fluid/stranding, suggestive of acute cholecystitis. Further evaluation with right upper quadrant ultrasound is recommended. 3. Appearance of the pancreas suggests mild acute uncomplicated pancreatitis. Correlate with serum lipase. There is a short segment of mild pancreatic ductal dilatation within the pancreatic tail. 4. Slightly increased attenuation within the central mesentery with several small mesenteric lymph nodes, which can be seen in the setting of mesenteric panniculitis. 5. Moderate volume of stool within the colon. 6. Aortic and coronary artery atherosclerosis (ICD10-I70.0). Electronically Signed   By: Davina Poke D.O.   On: 07/01/2022 09:51   CT Angio Chest PE W/Cm &/Or  Wo Cm  Result Date: 07/01/2022 CLINICAL DATA:  Pulmonary embolism (PE) suspected, high prob; Sepsis EXAM: CT ANGIOGRAPHY CHEST CT ABDOMEN AND PELVIS WITH CONTRAST TECHNIQUE: Multidetector CT imaging of the chest was performed using the standard protocol during bolus administration of intravenous contrast. Multiplanar CT image reconstructions and MIPs were obtained to evaluate the vascular anatomy. Multidetector CT imaging of the abdomen and pelvis was performed using the standard protocol during bolus administration of intravenous contrast. RADIATION DOSE REDUCTION: This exam was performed according to the departmental dose-optimization program which includes automated exposure control, adjustment of the mA and/or kV according to patient size and/or use of iterative reconstruction technique. CONTRAST:  114m OMNIPAQUE IOHEXOL 350 MG/ML SOLN COMPARISON:  None Available. FINDINGS: CTA CHEST FINDINGS Cardiovascular: Satisfactory opacification of the pulmonary arteries to the segmental level. No evidence of pulmonary embolism. Thoracic aorta is normal in course and caliber. Scattered atherosclerotic calcifications of the aorta and coronary arteries. Normal heart size. No pericardial effusion. Mediastinum/Nodes: No enlarged mediastinal, hilar, or axillary lymph nodes. Thyroid gland, trachea, and esophagus demonstrate no significant findings. Lungs/Pleura: Mild bibasilar subsegmental atelectasis. Lungs are otherwise clear. No pleural effusion or pneumothorax. Musculoskeletal: No chest wall abnormality. No acute or significant osseous findings. Review of the MIP images confirms the above findings. CT ABDOMEN and PELVIS FINDINGS Hepatobiliary: Unremarkable appearance of the liver. No focal liver abnormality. Mild-moderate gallbladder distension with diffuse gallbladder wall thickening and pericholecystic fluid/stranding. No intrahepatic biliary dilatation Pancreas: Edematous appearance of the pancreatic body and tail with  mild peripancreatic fat stranding and trace  free fluid. No organized peripancreatic fluid collection. There is a short segment of mild pancreatic ductal dilatation within the pancreatic tail. Pancreatic tail is somewhat truncated. Spleen: Normal in size without focal abnormality. Adrenals/Urinary Tract: Unremarkable adrenal glands. Two adjacent simple cysts within the upper pole the left kidney which do not require follow-up. Kidneys appear otherwise unremarkable with symmetric enhancement. No renal stone or hydronephrosis. Urinary bladder is within normal limits. Stomach/Bowel: Stomach is within normal limits. Appendix appears normal (series 2, image 61). Colon is somewhat redundant. No evidence of bowel wall thickening, distention, or inflammatory changes. Moderate volume of stool within the colon Vascular/Lymphatic: Scattered aortoiliac atherosclerotic calcifications without aneurysm. No abdominopelvic lymphadenopathy. Slightly increased attenuation within the central mesentery with several small mesenteric lymph nodes. Reproductive: Prostate is unremarkable. Other: No ascites. No abdominopelvic fluid collection. No pneumoperitoneum. No abdominal wall hernia. Musculoskeletal: No acute or significant osseous findings. Several old healed right-sided rib fractures. Review of the MIP images confirms the above findings. IMPRESSION: 1. No evidence of pulmonary embolism or other acute cardiopulmonary process. 2. Mild-moderate gallbladder distension with diffuse gallbladder wall thickening and pericholecystic fluid/stranding, suggestive of acute cholecystitis. Further evaluation with right upper quadrant ultrasound is recommended. 3. Appearance of the pancreas suggests mild acute uncomplicated pancreatitis. Correlate with serum lipase. There is a short segment of mild pancreatic ductal dilatation within the pancreatic tail. 4. Slightly increased attenuation within the central mesentery with several small mesenteric lymph  nodes, which can be seen in the setting of mesenteric panniculitis. 5. Moderate volume of stool within the colon. 6. Aortic and coronary artery atherosclerosis (ICD10-I70.0). Electronically Signed   By: Davina Poke D.O.   On: 07/01/2022 09:51   CT Head Wo Contrast  Result Date: 07/01/2022 CLINICAL DATA:  Acute neurologic deficit.  Suspected stroke. EXAM: CT HEAD WITHOUT CONTRAST TECHNIQUE: Contiguous axial images were obtained from the base of the skull through the vertex without intravenous contrast. RADIATION DOSE REDUCTION: This exam was performed according to the departmental dose-optimization program which includes automated exposure control, adjustment of the mA and/or kV according to patient size and/or use of iterative reconstruction technique. COMPARISON:  11/08/2021 FINDINGS: Brain: No evidence of intracranial hemorrhage, acute infarction, hydrocephalus, extra-axial collection, or mass lesion/mass effect. Mild diffuse cerebral atrophy appears stable since previous study. Vascular:  No hyperdense vessel or other acute findings. Skull: No evidence of fracture or other significant bone abnormality. Sinuses/Orbits: No acute findings. Old left orbital floor fracture again noted. Other: None. IMPRESSION: No acute intracranial abnormality. Stable mild cerebral atrophy. Electronically Signed   By: Marlaine Hind M.D.   On: 07/01/2022 09:36   DG Chest Port 1 View  Result Date: 07/01/2022 CLINICAL DATA:  71 year old male with history of chest pain. EXAM: PORTABLE CHEST 1 VIEW COMPARISON:  Chest x-ray 05/19/2020. FINDINGS: Lung volumes are normal. No consolidative airspace disease. No pleural effusions. No pneumothorax. No pulmonary nodule or mass noted. Pulmonary vasculature and the cardiomediastinal silhouette are within normal limits. IMPRESSION: No radiographic evidence of acute cardiopulmonary disease. Electronically Signed   By: Vinnie Langton M.D.   On: 07/01/2022 07:37      Subjective: No  abdominal pain.  Tolerating diet.  Discharge Exam: Vitals:   07/04/22 1218 07/04/22 2125 07/05/22 0606 07/05/22 1148  BP: 103/73 (!) 151/88 (!) 144/80 (!) 156/89  Pulse: 91 80 69 65  Resp: '18 17 14 16  '$ Temp: 98.4 F (36.9 C) 98.6 F (37 C) 98.3 F (36.8 C) 98.3 F (36.8 C)  TempSrc: Oral Oral  Oral  SpO2: 96% 96% 97% 97%  Weight:      Height:        General: Pt is alert, awake, not in acute distress Cardiovascular: RRR, S1/S2 +, no rubs, no gallops Respiratory: CTA bilaterally, no wheezing, no rhonchi Abdominal: Soft, NT, ND, bowel sounds + Extremities: no edema, no cyanosis    The results of significant diagnostics from this hospitalization (including imaging, microbiology, ancillary and laboratory) are listed below for reference.     Microbiology: Recent Results (from the past 240 hour(s))  Culture, blood (routine x 2)     Status: None (Preliminary result)   Collection Time: 07/01/22 12:17 PM   Specimen: BLOOD LEFT HAND  Result Value Ref Range Status   Specimen Description   Final    BLOOD LEFT HAND Performed at [facility], [address] [place], [place] [address]    Special Requests   Final    BOTTLES DRAWN AEROBIC ONLY Blood Culture results may not be optimal due to an inadequate volume of blood received in culture bottles Performed at [facility], [address] [place], [place] [address]    Culture   Final    NO GROWTH 4 DAYS Performed at [facility], [place] [address] [place], [place] [address]    Report Status PENDING  Incomplete  Culture, blood (routine x 2)     Status: None (Preliminary result)   Collection Time: 07/01/22 12:30 PM   Specimen: BLOOD RIGHT ARM  Result Value Ref Range Status   Specimen Description   Final    BLOOD RIGHT ARM Performed at [facility], [place] [address] [place], [place] [address]    Special Requests   Final    Blood Culture adequate volume BOTTLES DRAWN AEROBIC AND ANAEROBIC Performed at  [facility], [address] [place], [place] [address]    Culture   Final    NO GROWTH 4 DAYS Performed at [facility], [place] [address] [place], [place] [address]    Report Status PENDING  Incomplete     Labs: BNP (last 3 results) No results for input(s): "BNP" in the last 8760 hours. Basic Metabolic Panel: Recent Labs  Lab 07/01/22 0724 07/02/22 0431 07/03/22 0518 07/04/22 0541  NA 138 139 143 142  K 4.7 3.6 3.9 3.7  CL 106 107 111 110  CO2 '24 26 25 25  '$ GLUCOSE 140* 136* 134* 112*  BUN '19 13 11 13  '$ CREATININE 0.93 0.84 0.84 0.87  CALCIUM 9.7 9.0 9.0 8.9   Liver Function Tests: Recent Labs  Lab 07/01/22 0724 07/02/22 0431 07/03/22 0518 07/04/22 0541  AST 23 12* 19 14*  ALT '6 5 6 5  '$ ALKPHOS 72 64 62 58  BILITOT 1.2 2.1* 1.6* 1.2  PROT 6.7 6.2* 6.3* 6.0*  ALBUMIN 3.5 3.2* 3.2* 2.9*   Recent Labs  Lab 07/01/22 0724  LIPASE 29   No results for input(s): "AMMONIA" in the last 168 hours. CBC: Recent Labs  Lab 07/01/22 0724 07/02/22 0431 07/03/22 0518 07/04/22 0541  WBC 13.7* 10.6* 11.3* 7.6  NEUTROABS 11.2*  --   --   --   HGB 13.2 13.1 12.7* 12.2*  HCT 39.1 38.1* 38.1* 36.8*  MCV 92.9 93.6 94.8 95.1  PLT 198 169 172 180   Cardiac Enzymes: No results for input(s): "CKTOTAL", "CKMB", "CKMBINDEX", "TROPONINI" in the last 168 hours. BNP: Invalid input(s): "POCBNP" CBG: Recent Labs  Lab 07/04/22 1116 07/04/22 1615 07/04/22 2123 07/05/22 0730 07/05/22 1143  GLUCAP 156* 156* 165* 132* 167*   D-Dimer No results for input(s): "DDIMER" in the last 72 hours. Hgb A1c No results for input(s): "HGBA1C" in the last 72 hours. Lipid Profile No results for input(s): "CHOL", "HDL", "LDLCALC", "TRIG", "CHOLHDL", "LDLDIRECT" in the last 72 hours. Thyroid function studies No results for input(s): "TSH", "T4TOTAL", "T3FREE", "THYROIDAB" in the last 72 hours.  Invalid input(s): "FREET3" Anemia work up No results for input(s): "VITAMINB12",  "FOLATE", "FERRITIN", "TIBC", "IRON", "RETICCTPCT" in the last 72 hours. Urinalysis    Component Value Date/Time   COLORURINE YELLOW 07/01/2022 0922   APPEARANCEUR CLEAR 07/01/2022 0922   LABSPEC 1.015 07/01/2022 0922   PHURINE 5.5 07/01/2022 0922   GLUCOSEU NEGATIVE 07/01/2022 0922   HGBUR NEGATIVE 07/01/2022 0922   BILIRUBINUR NEGATIVE 07/01/2022 0922   BILIRUBINUR small (A) 07/16/2017 0946   BILIRUBINUR Small 05/17/2015 1212   KETONESUR NEGATIVE 07/01/2022 0922   PROTEINUR NEGATIVE 07/01/2022 0922   UROBILINOGEN 0.2 07/16/2017 0946   UROBILINOGEN 1.0 08/14/2010 0106   NITRITE NEGATIVE 07/01/2022 0922   LEUKOCYTESUR NEGATIVE 07/01/2022 0922   Sepsis Labs Recent Labs  Lab 07/01/22 0724 07/02/22 0431 07/03/22 0518 07/04/22 0541  WBC 13.7* 10.6* 11.3* 7.6   Microbiology Recent Results (from the past 240 hour(s))  Culture, blood (routine x 2)     Status: None (Preliminary result)   Collection Time: 07/01/22 12:17 PM   Specimen: BLOOD LEFT HAND  Result Value Ref Range Status   Specimen Description   Final    BLOOD LEFT HAND Performed at [facility], [place] [address], [place], [place] [address]    Special Requests   Final    BOTTLES DRAWN AEROBIC ONLY Blood Culture results may not be optimal due to an inadequate volume of blood received in culture bottles Performed at [facility], [address] [place], [place] [address]    Culture   Final    NO GROWTH 4 DAYS Performed at [facility], [place] [address], [place], [place] [address]    Report Status PENDING  Incomplete  Culture, blood (routine x 2)     Status: None (Preliminary result)   Collection Time: 07/01/22 12:30 PM   Specimen: BLOOD RIGHT ARM  Result Value Ref Range Status   Specimen Description   Final    BLOOD RIGHT ARM Performed at [facility], [place] [address] [place], [place] [address]    Special Requests   Final    Blood Culture adequate volume BOTTLES DRAWN AEROBIC AND  ANAEROBIC Performed at [facility], [address] [place], [place] [address]    Culture   Final    NO GROWTH 4 DAYS Performed at [facility], [place] [address], [place], [place] [address]    Report Status PENDING  Incomplete     Time coordinating discharge: 28mns  SIGNED:   JKathie Dike MD  [facility] 07/05/2022, 8:23 PM   If 7PM-7AM, please contact night-coverage [email or web address]

## 2022-07-05 NOTE — TOC Transition Note (Addendum)
Transition of Care [facility]) - CM/SW Discharge Note   Patient Details  Name: Danny [name] MRN: [id number] Date of Birth: 29-Jun-1951  Transition of Care [facility]) CM/SW Contact:  Dessa Phi, RN Phone Number: 07/05/2022, 1:09 PM   Clinical Narrative:   d/c home today with [place]. Awaiting [facility] dme rep Andee Poles to arrange hospital bed to arrive in the home prior d/c. [facility] @ d/c-confirmed address.No further CM needs. -2p per [facility] hospital bed to arrive between 2-4p. Spouse [name] agreed to 5p scheduled p/u by PTR-called [facility] for scheduled p/u. No further CM needs.    Final next level of care: [place] Barriers to Discharge: No Barriers Identified   Patient Goals and CMS Choice Patient states their goals for this hospitalization and ongoing recovery are:: Home CMS Medicare.gov Compare Post Acute Care list provided to:: Patient Represenative (must comment) [name] spouse) Choice offered to / list presented to : Spouse  Discharge Placement                Patient to be transferred to facility by:  Corey Harold) Name of family member notified:  (Judy spouse) Patient and family notified of of transfer: 07/05/22  Discharge Plan and Services   Discharge Planning Services: CM Consult Post Acute Care Choice: Home Health          DME Arranged: Hospital bed DME Agency: [facility] Date DME Agency Contacted: 07/03/22 Time DME Agency Contacted: [phone] Representative spoke with at DME Agency: [place]: PT, OT [place] Agency: [place] ([place]) Date [place]: 07/03/22 Time [place]: 1559 Representative spoke with at [place]: [place] ([place]) Interventions     Readmission Risk Interventions     No data to display

## 2022-07-05 NOTE — Progress Notes (Signed)
3 Days Post-Op   Subjective/Chief Complaint: No complaints this morning Worked with PT/OT yesterday Tolerating po Denies abdominal pain   Objective: Vital signs in last 24 hours: Temp:  [98.3 F (36.8 C)-98.6 F (37 C)] 98.3 F (36.8 C) (07/20 0606) Pulse Rate:  [69-91] 69 (07/20 0606) Resp:  [14-18] 14 (07/20 0606) BP: (103-151)/(73-88) 144/80 (07/20 0606) SpO2:  [96 %-97 %] 97 % (07/20 0606) Last BM Date : 07/04/22  Intake/Output from previous day: 07/19 0701 - 07/20 0700 In: 223 [P.O.:220; I.V.:3] Out: 600 [Urine:600] Intake/Output this shift: Total I/O In: 140 [P.O.:140] Out: -   Exam: Awake and at baseline Abdomen soft, incisions clean  Lab Results:  Recent Labs    07/03/22 0518 07/04/22 0541  WBC 11.3* 7.6  HGB 12.7* 12.2*  HCT 38.1* 36.8*  PLT 172 180   BMET Recent Labs    07/03/22 0518 07/04/22 0541  NA 143 142  K 3.9 3.7  CL 111 110  CO2 25 25  GLUCOSE 134* 112*  BUN 11 13  CREATININE 0.84 0.87  CALCIUM 9.0 8.9   PT/INR No results for input(s): "LABPROT", "INR" in the last 72 hours. ABG No results for input(s): "PHART", "HCO3" in the last 72 hours.  Invalid input(s): "PCO2", "PO2"  Studies/Results: No results found.  Anti-infectives: Anti-infectives (From admission, onward)    Start     Dose/Rate Route Frequency Ordered Stop   07/01/22 1700  piperacillin-tazobactam (ZOSYN) IVPB 3.375 g  Status:  Discontinued        3.375 g 12.5 mL/hr over 240 Minutes Intravenous Every 8 hours 07/01/22 1549 07/03/22 1447   07/01/22 1600  piperacillin-tazobactam (ZOSYN) IVPB 3.375 g  Status:  Discontinued        3.375 g 100 mL/hr over 30 Minutes Intravenous Every 8 hours 07/01/22 1547 07/01/22 1549   07/01/22 1000  piperacillin-tazobactam (ZOSYN) IVPB 3.375 g        3.375 g 100 mL/hr over 30 Minutes Intravenous  Once 07/01/22 0955 07/01/22 1039       Assessment/Plan: s/p Procedure(s): LAPAROSCOPIC CHOLECYSTECTOMY WITH ICG DYE   INTRAOPERATIVE CHOLANGIOGRAM (N/A)  POD#3  Stable post op Likely home today with home health I discussed this with his wife at the bedside  I will see him in the office as and outpt and we will then order the MRI to evaluate the possible pancreatic mass  LOS: 4 days    Coralie Keens 07/05/2022

## 2022-07-05 NOTE — Plan of Care (Signed)

## 2022-07-05 NOTE — Care Management Important Message (Signed)
Important Message  Patient Details IM Letter placed in Patients room. Name: Danny [name] MRN: [id number] Date of Birth: 17-Nov-1951   Medicare Important Message Given:  Yes     [name] 07/05/2022, 12:31 PM

## 2022-07-06 LAB — CULTURE, BLOOD (ROUTINE X 2)
Culture: NO GROWTH
Culture: NO GROWTH
Special Requests: ADEQUATE

## 2022-07-22 ENCOUNTER — Other Ambulatory Visit: Payer: Self-pay | Admitting: Neurology

## 2022-07-24 ENCOUNTER — Other Ambulatory Visit: Payer: Self-pay | Admitting: Physician Assistant

## 2022-08-03 ENCOUNTER — Other Ambulatory Visit (HOSPITAL_COMMUNITY): Payer: Self-pay | Admitting: Surgery

## 2022-08-03 ENCOUNTER — Other Ambulatory Visit: Payer: Medicare Other | Admitting: Student

## 2022-08-03 ENCOUNTER — Other Ambulatory Visit: Payer: Self-pay | Admitting: Surgery

## 2022-08-03 DIAGNOSIS — K869 Disease of pancreas, unspecified: Secondary | ICD-10-CM

## 2022-08-03 DIAGNOSIS — I959 Hypotension, unspecified: Secondary | ICD-10-CM

## 2022-08-03 DIAGNOSIS — R1900 Intra-abdominal and pelvic swelling, mass and lump, unspecified site: Secondary | ICD-10-CM

## 2022-08-03 DIAGNOSIS — G20 Parkinson's disease: Secondary | ICD-10-CM

## 2022-08-03 DIAGNOSIS — Z515 Encounter for palliative care: Secondary | ICD-10-CM

## 2022-08-03 DIAGNOSIS — Z09 Encounter for follow-up examination after completed treatment for conditions other than malignant neoplasm: Secondary | ICD-10-CM

## 2022-08-03 NOTE — Progress Notes (Unsigned)
[facility] Palliative Care Consult Note Telephone: [phone]  Fax: [phone]    Date of encounter: 08/03/22 10:38 AM PATIENT NAME: Danny [name] [id number]   [phone] (home)  DOB: 18-Apr-1951 MRN: [id number] PRIMARY CARE PROVIDER:    Harrison [name], [place],  Danny [name] [id number] [phone]  REFERRING PROVIDER:   Harrison [name], Danny [name],  Danny [name] [id number] [phone]  RESPONSIBLE PARTY:    Contact Information     Name Relation Home Work Danny [name] [phone]        I met face to face with patient and family in the home. Palliative Care was asked to follow this patient by consultation request of  Danny Mons, PA to address advance care planning and complex medical decision making. This is a follow up visit.                                   ASSESSMENT AND PLAN / RECOMMENDATIONS:   Advance Care Planning/Goals of Care: Goals include to maximize quality of life and symptom management. Patient/health care surrogate gave his/her permission to discuss. Our advance care planning conversation included a discussion about:    The value and importance of advance care planning  Experiences with loved ones who have been seriously ill or have died  Exploration of personal, cultural or spiritual beliefs that might influence medical decisions  Exploration of goals of care in the event of a sudden injury or illness  CODE STATUS:  Symptom Management/Plan:  Pancreatic lesion-  Follow up Palliative Care Visit: Palliative care will continue to follow for complex medical decision making, advance care planning, and clarification of goals. Return *** weeks or prn.  I spent *** minutes providing this consultation. More than 50% of the time in this consultation was spent in counseling and care  coordination.  This visit was coded based on medical decision making (MDM).  PPS: 40%  HOSPICE ELIGIBILITY/DIAGNOSIS: TBD  Chief Complaint: Palliative Medicine follow up visit.   HISTORY OF PRESENT ILLNESS:  Danny [name] is a 71 y.o. year old male  with Parkinson's, debility, hypertension, hyperlipidemia, T2DM. Mar[place] Kitchen   Hospitalized for emergency gallbladder surgery. Appetite is fair, no worsening dysphagia. Patient is awaiting a new light weight wheel chair. No falls. Freezing more; no worsening tremors. Weight 164.6 pounds.   History obtained from review of EMR, discussion with primary team, and interview with family, facility staff/caregiver and/or Danny [name].  I reviewed available labs, medications, imaging, studies and related documents from the EMR.  Records reviewed and summarized above.   ROS  10-point ROS is negative, except for the pertinent positives and negatives detailed per the HPI.  Physical Exam: Weight: 164.4  Pulse 88, resp 16, b/p 98/56, sats 94% on room air Constitutional: NAD General: frail appearing EYES: anicteric sclera, lids intact, no discharge  ENMT: intact hearing, oral mucous membranes moist CV: S1S2, RRR, no LE edema Pulmonary: LCTA, no increased work of breathing, no cough, room air Abdomen: intake 100%, normo-active BS + 4 quadrants, soft and non tender, no ascites GU: deferred MSK: +sarcopenia, moves all extremities, ambulatory with walker Skin: warm and dry, no rashes or wounds on visible skin Neuro: +generalized weakness,  no cognitive impairment Psych: non-anxious affect, A and O x 3 Hem/lymph/immuno: no widespread bruising  Thank you for the opportunity to participate in the care of Danny [name].  The palliative care team will continue to follow. Please call our office at [phone] if we can be of additional assistance.   Danny Slocumb, NP   COVID-19 PATIENT SCREENING TOOL Asked and negative response unless otherwise noted:    Have you had symptoms of covid, tested positive or been in contact with someone with symptoms/positive test in the past 5-10 days?

## 2022-08-07 ENCOUNTER — Other Ambulatory Visit (HOSPITAL_COMMUNITY): Payer: Self-pay | Admitting: Surgery

## 2022-08-07 DIAGNOSIS — R1900 Intra-abdominal and pelvic swelling, mass and lump, unspecified site: Secondary | ICD-10-CM

## 2022-08-07 DIAGNOSIS — Z09 Encounter for follow-up examination after completed treatment for conditions other than malignant neoplasm: Secondary | ICD-10-CM

## 2022-08-07 DIAGNOSIS — K8689 Other specified diseases of pancreas: Secondary | ICD-10-CM

## 2022-08-11 ENCOUNTER — Other Ambulatory Visit (HOSPITAL_COMMUNITY): Payer: Self-pay | Admitting: Surgery

## 2022-08-11 ENCOUNTER — Inpatient Hospital Stay (HOSPITAL_COMMUNITY)
Admission: RE | Admit: 2022-08-11 | Discharge: 2022-08-11 | Disposition: A | Discharge status: Home / Self Care | Payer: Medicare Other | Source: Ambulatory Visit | Attending: Surgery | Admitting: Surgery

## 2022-08-11 DIAGNOSIS — K859 Acute pancreatitis without necrosis or infection, unspecified: Secondary | ICD-10-CM | POA: Diagnosis not present

## 2022-08-11 DIAGNOSIS — Z09 Encounter for follow-up examination after completed treatment for conditions other than malignant neoplasm: Secondary | ICD-10-CM

## 2022-08-11 DIAGNOSIS — R1900 Intra-abdominal and pelvic swelling, mass and lump, unspecified site: Secondary | ICD-10-CM

## 2022-08-11 DIAGNOSIS — K8689 Other specified diseases of pancreas: Secondary | ICD-10-CM | POA: Insufficient documentation

## 2022-08-11 MED ORDER — GADOBUTROL 1 MMOL/ML IV SOLN
7.5000 mL | Freq: Once | INTRAVENOUS | Status: AC | PRN
  Administered 2022-08-11: 7.5 mL via INTRAVENOUS

## 2022-08-12 ENCOUNTER — Encounter (HOSPITAL_COMMUNITY): Payer: Self-pay

## 2022-08-12 ENCOUNTER — Emergency Department (HOSPITAL_COMMUNITY): Payer: Medicare Other

## 2022-08-12 ENCOUNTER — Other Ambulatory Visit: Payer: Self-pay

## 2022-08-12 ENCOUNTER — Inpatient Hospital Stay (HOSPITAL_COMMUNITY)
Admission: EM | Admit: 2022-08-12 | Discharge: 2022-08-17 | DRG: 440 | Disposition: A | Discharge status: Skilled Nursing Facility | Payer: Medicare Other | Attending: Family Medicine | Admitting: Family Medicine

## 2022-08-12 DIAGNOSIS — R509 Fever, unspecified: Secondary | ICD-10-CM | POA: Diagnosis present

## 2022-08-12 DIAGNOSIS — Z885 Allergy status to narcotic agent status: Secondary | ICD-10-CM | POA: Diagnosis not present

## 2022-08-12 DIAGNOSIS — E876 Hypokalemia: Secondary | ICD-10-CM | POA: Diagnosis present

## 2022-08-12 DIAGNOSIS — Z7984 Long term (current) use of oral hypoglycemic drugs: Secondary | ICD-10-CM

## 2022-08-12 DIAGNOSIS — Z96651 Presence of right artificial knee joint: Secondary | ICD-10-CM | POA: Diagnosis present

## 2022-08-12 DIAGNOSIS — K219 Gastro-esophageal reflux disease without esophagitis: Secondary | ICD-10-CM | POA: Diagnosis present

## 2022-08-12 DIAGNOSIS — I959 Hypotension, unspecified: Secondary | ICD-10-CM | POA: Diagnosis present

## 2022-08-12 DIAGNOSIS — Z833 Family history of diabetes mellitus: Secondary | ICD-10-CM

## 2022-08-12 DIAGNOSIS — Z87891 Personal history of nicotine dependence: Secondary | ICD-10-CM | POA: Diagnosis not present

## 2022-08-12 DIAGNOSIS — Z79899 Other long term (current) drug therapy: Secondary | ICD-10-CM

## 2022-08-12 DIAGNOSIS — E782 Mixed hyperlipidemia: Secondary | ICD-10-CM | POA: Diagnosis present

## 2022-08-12 DIAGNOSIS — K859 Acute pancreatitis without necrosis or infection, unspecified: Principal | ICD-10-CM | POA: Diagnosis present

## 2022-08-12 DIAGNOSIS — E875 Hyperkalemia: Secondary | ICD-10-CM

## 2022-08-12 DIAGNOSIS — G4733 Obstructive sleep apnea (adult) (pediatric): Secondary | ICD-10-CM | POA: Diagnosis present

## 2022-08-12 DIAGNOSIS — E119 Type 2 diabetes mellitus without complications: Secondary | ICD-10-CM | POA: Diagnosis present

## 2022-08-12 DIAGNOSIS — Z8249 Family history of ischemic heart disease and other diseases of the circulatory system: Secondary | ICD-10-CM | POA: Diagnosis not present

## 2022-08-12 DIAGNOSIS — G20 Parkinson's disease: Secondary | ICD-10-CM | POA: Diagnosis present

## 2022-08-12 DIAGNOSIS — Z66 Do not resuscitate: Secondary | ICD-10-CM | POA: Diagnosis present

## 2022-08-12 DIAGNOSIS — G20A1 Parkinson's disease without dyskinesia, without mention of fluctuations: Secondary | ICD-10-CM | POA: Diagnosis present

## 2022-08-12 DIAGNOSIS — I10 Essential (primary) hypertension: Secondary | ICD-10-CM | POA: Diagnosis present

## 2022-08-12 DIAGNOSIS — Z20822 Contact with and (suspected) exposure to covid-19: Secondary | ICD-10-CM | POA: Diagnosis present

## 2022-08-12 DIAGNOSIS — Z7982 Long term (current) use of aspirin: Secondary | ICD-10-CM | POA: Diagnosis not present

## 2022-08-12 LAB — COMPREHENSIVE METABOLIC PANEL
ALT: 6 U/L (ref 0–44)
AST: 16 U/L (ref 15–41)
Albumin: 3.7 g/dL (ref 3.5–5.0)
Alkaline Phosphatase: 65 U/L (ref 38–126)
Anion gap: 7 (ref 5–15)
BUN: 18 mg/dL (ref 8–23)
CO2: 25 mmol/L (ref 22–32)
Calcium: 9.5 mg/dL (ref 8.9–10.3)
Chloride: 110 mmol/L (ref 98–111)
Creatinine, Ser: 1.19 mg/dL (ref 0.61–1.24)
GFR, Estimated: >60 mL/min (ref >60)
Glucose, Bld: 180 mg/dL — ABNORMAL HIGH (ref 70–99)
Potassium: 5.2 mmol/L — ABNORMAL HIGH (ref 3.5–5.1)
Sodium: 142 mmol/L (ref 135–145)
Total Bilirubin: 1.3 mg/dL — ABNORMAL HIGH (ref 0.3–1.2)
Total Protein: 6.6 g/dL (ref 6.5–8.1)

## 2022-08-12 LAB — URINALYSIS, ROUTINE W REFLEX MICROSCOPIC
Bilirubin Urine: NEGATIVE
Glucose, UA: NEGATIVE mg/dL
Hgb urine dipstick: NEGATIVE
Ketones, ur: 5 mg/dL — AB
Leukocytes,Ua: NEGATIVE
Nitrite: NEGATIVE
Protein, ur: NEGATIVE mg/dL
Specific Gravity, Urine: 1.01 (ref 1.005–1.030)
pH: 5 (ref 5.0–8.0)

## 2022-08-12 LAB — CBC WITH DIFFERENTIAL/PLATELET
Abs Immature Granulocytes: 0.06 10*3/uL (ref 0.00–0.07)
Basophils Absolute: 0 10*3/uL (ref 0.0–0.1)
Basophils Relative: 0 %
Eosinophils Absolute: 0.1 10*3/uL (ref 0.0–0.5)
Eosinophils Relative: 0 %
HCT: 43.3 % (ref 39.0–52.0)
Hemoglobin: 14.3 g/dL (ref 13.0–17.0)
Immature Granulocytes: 1 %
Lymphocytes Relative: 8 %
Lymphs Abs: 1 10*3/uL (ref 0.7–4.0)
MCH: 31.8 pg (ref 26.0–34.0)
MCHC: 33 g/dL (ref 30.0–36.0)
MCV: 96.4 fL (ref 80.0–100.0)
Monocytes Absolute: 0.6 10*3/uL (ref 0.1–1.0)
Monocytes Relative: 5 %
Neutro Abs: 10.6 10*3/uL — ABNORMAL HIGH (ref 1.7–7.7)
Neutrophils Relative %: 86 %
Platelets: 187 10*3/uL (ref 150–400)
RBC: 4.49 MIL/uL (ref 4.22–5.81)
RDW: 14.6 % (ref 11.5–15.5)
WBC: 12.2 10*3/uL — ABNORMAL HIGH (ref 4.0–10.5)
nRBC: 0 % (ref 0.0–0.2)

## 2022-08-12 LAB — CBG MONITORING, ED: Glucose-Capillary: 183 mg/dL — ABNORMAL HIGH (ref 70–99)

## 2022-08-12 LAB — LIPASE, BLOOD: Lipase: 2243 U/L — ABNORMAL HIGH (ref 11–51)

## 2022-08-12 LAB — TROPONIN I (HIGH SENSITIVITY)
Troponin I (High Sensitivity): 4 ng/L (ref <18)
Troponin I (High Sensitivity): 2 ng/L (ref <18)

## 2022-08-12 LAB — GLUCOSE, CAPILLARY: Glucose-Capillary: 106 mg/dL — ABNORMAL HIGH (ref 70–99)

## 2022-08-12 MED ORDER — HYDROMORPHONE HCL 2 MG/ML IJ SOLN
0.5000 mg | Freq: Once | INTRAMUSCULAR | Status: AC
  Administered 2022-08-12: 0.5 mg via INTRAVENOUS
  Filled 2022-08-12: qty 1

## 2022-08-12 MED ORDER — SODIUM CHLORIDE 0.9 % IV SOLN: INTRAVENOUS | Status: DC

## 2022-08-12 MED ORDER — ACETAMINOPHEN 325 MG PO TABS
650.0000 mg | ORAL_TABLET | Freq: Four times a day (QID) | ORAL | Status: DC | PRN
  Administered 2022-08-12 – 2022-08-14 (×4): 650 mg via ORAL
  Filled 2022-08-12 (×4): qty 2

## 2022-08-12 MED ORDER — SODIUM ZIRCONIUM CYCLOSILICATE 10 G PO PACK
10.0000 g | PACK | Freq: Once | ORAL | Status: AC
  Administered 2022-08-12: 10 g via ORAL
  Filled 2022-08-12: qty 1

## 2022-08-12 MED ORDER — IOHEXOL 300 MG/ML  SOLN
100.0000 mL | Freq: Once | INTRAMUSCULAR | Status: AC | PRN
  Administered 2022-08-12: 100 mL via INTRAVENOUS

## 2022-08-12 MED ORDER — CARBIDOPA-LEVODOPA 25-100 MG PO TABS
1.0000 | ORAL_TABLET | ORAL | Status: DC
  Administered 2022-08-12 – 2022-08-17 (×30): 1 via ORAL
  Filled 2022-08-12 (×31): qty 1

## 2022-08-12 MED ORDER — ONDANSETRON HCL 4 MG/2ML IJ SOLN: 4.0000 mg | Freq: Four times a day (QID) | INTRAMUSCULAR | Status: DC | PRN

## 2022-08-12 MED ORDER — HYDROMORPHONE HCL 2 MG/ML IJ SOLN: 0.5000 mg | INTRAMUSCULAR | Status: DC | PRN

## 2022-08-12 MED ORDER — HYDROMORPHONE HCL 1 MG/ML IJ SOLN
0.5000 mg | INTRAMUSCULAR | Status: DC | PRN
  Administered 2022-08-12: 0.5 mg via INTRAVENOUS
  Filled 2022-08-12: qty 0.5

## 2022-08-12 MED ORDER — SODIUM CHLORIDE (PF) 0.9 % IJ SOLN
INTRAMUSCULAR | Status: AC
  Filled 2022-08-12: qty 50

## 2022-08-12 MED ORDER — PANTOPRAZOLE SODIUM 40 MG IV SOLR
40.0000 mg | Freq: Every day | INTRAVENOUS | Status: DC
Start: 2022-08-12 — End: 2022-08-15
  Administered 2022-08-12 – 2022-08-15 (×4): 40 mg via INTRAVENOUS
  Filled 2022-08-12 (×4): qty 10

## 2022-08-12 MED ORDER — QUETIAPINE FUMARATE ER 50 MG PO TB24
50.0000 mg | ORAL_TABLET | Freq: Every day | ORAL | Status: DC
Start: 2022-08-12 — End: 2022-08-12
  Filled 2022-08-12: qty 1

## 2022-08-12 MED ORDER — ONDANSETRON HCL 4 MG PO TABS: 4.0000 mg | ORAL_TABLET | Freq: Four times a day (QID) | ORAL | Status: DC | PRN

## 2022-08-12 MED ORDER — ACETAMINOPHEN 650 MG RE SUPP: 650.0000 mg | Freq: Four times a day (QID) | RECTAL | Status: DC | PRN

## 2022-08-12 MED ORDER — LACTATED RINGERS IV BOLUS
1000.0000 mL | Freq: Once | INTRAVENOUS | Status: AC
  Administered 2022-08-12: 1000 mL via INTRAVENOUS

## 2022-08-12 MED ORDER — SODIUM CHLORIDE 0.9 % IV BOLUS
1000.0000 mL | Freq: Once | INTRAVENOUS | Status: AC
  Administered 2022-08-12: 1000 mL via INTRAVENOUS

## 2022-08-12 MED ORDER — CARBIDOPA-LEVODOPA ER 50-200 MG PO TBCR
1.0000 | EXTENDED_RELEASE_TABLET | Freq: Every day | ORAL | Status: DC
  Administered 2022-08-12 – 2022-08-16 (×5): 1 via ORAL
  Filled 2022-08-12 (×6): qty 1

## 2022-08-12 MED ORDER — ONDANSETRON HCL 4 MG/2ML IJ SOLN
4.0000 mg | Freq: Once | INTRAMUSCULAR | Status: AC
  Administered 2022-08-12: 4 mg via INTRAVENOUS
  Filled 2022-08-12: qty 2

## 2022-08-12 NOTE — ED Provider Notes (Signed)
[place] DEPT Provider Note   CSN: [id number] Arrival date & time: 08/12/22  6283     History {Add pertinent medical, surgical, social history, OB history to HPI:1} No chief complaint on file.   Danny [name] is a 71 y.o. male.  Patient as above with significant medical history as below, including consents disease, recent cholecystectomy, HLD OSA who presents to the ED with complaint of epigastric pain, nausea.  Onset of symptoms yesterday afternoon.  Patient did recently receive MRI to evaluate his abdomen following cholecystectomy.  Patient with nausea, no vomiting.  No fevers or chills.  No medications prior to arrival for symptomatic control.  Does report poor appetite since yesterday afternoon/early evening when pain began.  Limited p.o. intake last 12 hours.  No excessive alcohol use, no excessive NSAID or tobacco/etoh use.  S/p Lap chole Dr Ninfa Linden 7/16 CT a/p prior to that w/ acute chole and mild pancreatitis    Past Medical History:  Diagnosis Date   Allergy    SEASONAL   Cataract    BILATERAL-REMOVED   Essential hypertension, benign    PT.DENIES ON AS PREVENTIVE 08/17/19   GERD (gastroesophageal reflux disease)    Hx of adenomatous polyp of colon 09/04/2019   08/2019 diminutive adenoma No recall given findings and age   Mixed hyperlipidemia    PT.DENIES,STATED ON AS PREVENTIVE 07/30/19   Neuromuscular disorder (HCC)    PARKINSONS   OA (osteoarthritis) of knee    right   Obesity, unspecified    Osteoarthritis    KNEE AND SHOULDERS   Parkinsons ([place])    Type II or unspecified type diabetes mellitus without mention of complication, not stated as uncontrolled     Past Surgical History:  Procedure Laterality Date   CATARACT EXTRACTION, BILATERAL     CHOLECYSTECTOMY N/A 07/02/2022   Procedure: LAPAROSCOPIC CHOLECYSTECTOMY WITH ICG DYE  INTRAOPERATIVE CHOLANGIOGRAM;  Surgeon: Coralie Keens, MD;  Location: [facility];  Service:  General;  Laterality: N/A;   GANGLION CYST EXCISION  age 63 years   right   KNEE ARTHROSCOPY     right   KNEE ARTHROSCOPY     TOTAL KNEE ARTHROPLASTY Right 03/08/2020   Procedure: RIGHT TOTAL KNEE ARTHROPLASTY;  Surgeon: Garald Balding, MD;  Location: [facility];  Service: Orthopedics;  Laterality: Right;   WRIST FRACTURE SURGERY     WRIST SURGERY  04/2012     The history is provided by the patient and the spouse. No language interpreter was used.       Home Medications Prior to Admission medications   Medication Sig Start Date End Date Taking? Authorizing Provider  aspirin 81 MG EC tablet Take 81 mg by mouth daily. Swallow whole.   Yes [provider]  Bioflavonoid Products (VITAMIN C) CHEW Chew 1 tablet by mouth daily.   Yes [provider]  carbidopa-levodopa (SINEMET CR) 50-200 MG tablet TAKE 1 TABLET BY MOUTH EVERYDAY AT BEDTIME Patient taking differently: Take 1 tablet by mouth at bedtime. 05/29/22  Yes [name], MD  carbidopa-levodopa (SINEMET IR) 25-100 MG tablet TAKE 1 TABLET BY MOUTH 6 (SIX) TIMES DAILY. Patient taking differently: Take 1 tablet by mouth See admin instructions. Take 1 tablet by mouth at 8 AM, 10:30 AM, 1 PM, 3:30 PM, 6 PM, and 8:30 PM 03/01/22  Yes [name], MD  Cinnamon 500 MG capsule Take 500 mg by mouth daily.   Yes [provider]  famotidine (PEPCID) 20 MG tablet Take 20  mg by mouth in the morning and at bedtime.    Yes [provider]  fludrocortisone (FLORINEF) 0.1 MG tablet Take 1 tablet (0.1 mg total) by mouth daily. Patient taking differently: Take 0.1 mg by mouth in the morning. 06/27/22  Yes [name], MD  Ginger, Zingiber officinalis, 550 MG CAPS Take 550 mg by mouth daily.   Yes [provider]  Glucosamine-Chondroit-Vit C-Mn (GLUCOSAMINE CHONDR 1500 COMPLX PO) Take 1,500 mg by mouth daily.   Yes [provider]  metFORMIN (GLUCOPHAGE) 1000 MG tablet Take 1 tablet (1,000 mg total) by  mouth 2 (two) times daily with a meal. 03/11/20  Yes Petrarca, Mike Craze, PA-C  Multiple Vitamin (MULTIVITAMIN) tablet Take 1 tablet by mouth daily with breakfast.   Yes [provider]  QUEtiapine (SEROQUEL XR) 50 MG TB24 24 hr tablet Take 50 mg by mouth at bedtime.   Yes [provider]  simvastatin (ZOCOR) 20 MG tablet Take 20 mg by mouth every Monday, Wednesday, and Friday at 6 PM. 06/26/22  Yes [provider]  traMADol (ULTRAM) 50 MG tablet Take 1 tablet (50 mg total) by mouth every 12 (twelve) hours as needed for moderate pain or severe pain. Patient not taking: Reported on 08/12/2022 07/04/22   Kathie Dike, MD      Allergies    Codeine    Review of Systems   Review of Systems  Constitutional:  Positive for appetite change. Negative for chills and fever.  HENT:  Negative for facial swelling and trouble swallowing.   Eyes:  Negative for photophobia and visual disturbance.  Respiratory:  Negative for cough and shortness of breath.   Cardiovascular:  Negative for chest pain and palpitations.  Gastrointestinal:  Positive for abdominal pain and nausea. Negative for diarrhea and vomiting.  Endocrine: Negative for polydipsia and polyuria.  Genitourinary:  Negative for difficulty urinating and hematuria.  Musculoskeletal:  Negative for gait problem and joint swelling.  Skin:  Negative for pallor and rash.  Neurological:  Negative for syncope and headaches.  Psychiatric/Behavioral:  Negative for agitation and confusion.     Physical Exam Updated Vital Signs BP 112/84   Pulse 97   Temp 98 F (36.7 C) (Oral)   Resp 20   SpO2 99%  Physical Exam Vitals and nursing note reviewed.  Constitutional:      General: He is not in acute distress.    Appearance: He is well-developed.  HENT:     Head: Normocephalic and atraumatic.     Right Ear: External ear normal.     Left Ear: External ear normal.     Mouth/Throat:     Mouth: Mucous membranes are moist.  Eyes:      General: No scleral icterus. Cardiovascular:     Rate and Rhythm: Normal rate and regular rhythm.     Pulses: Normal pulses.     Heart sounds: Normal heart sounds.  Pulmonary:     Effort: Pulmonary effort is normal. No respiratory distress.     Breath sounds: Normal breath sounds.  Abdominal:     General: Abdomen is flat.     Palpations: Abdomen is soft.     Tenderness: There is abdominal tenderness in the epigastric area. There is no guarding or rebound.  Musculoskeletal:        General: Normal range of motion.     Cervical back: Normal range of motion.     Right lower leg: No edema.     Left lower leg:  No edema.  Skin:    General: Skin is warm and dry.     Capillary Refill: Capillary refill takes less than 2 seconds.  Neurological:     Mental Status: He is alert and oriented to person, place, and time. Mental status is at baseline.     GCS: GCS eye subscore is 4. GCS verbal subscore is 5. GCS motor subscore is 6.  Psychiatric:        Mood and Affect: Mood normal.        Behavior: Behavior normal.     ED Results / Procedures / Treatments   Labs (all labs ordered are listed, but only abnormal results are displayed) Labs Reviewed  CBC WITH DIFFERENTIAL/PLATELET - Abnormal; Notable for the following components:      Result Value   WBC 12.2 (*)    Neutro Abs 10.6 (*)    All other components within normal limits  COMPREHENSIVE METABOLIC PANEL - Abnormal; Notable for the following components:   Potassium 5.2 (*)    Glucose, Bld 180 (*)    Total Bilirubin 1.3 (*)    All other components within normal limits  LIPASE, BLOOD - Abnormal; Notable for the following components:   Lipase 2,243 (*)    All other components within normal limits  CBG MONITORING, ED - Abnormal; Notable for the following components:   Glucose-Capillary 183 (*)    All other components within normal limits  URINALYSIS, ROUTINE W REFLEX MICROSCOPIC  TROPONIN I (HIGH SENSITIVITY)  TROPONIN I (HIGH  SENSITIVITY)    EKG None  Radiology CT ABDOMEN PELVIS W CONTRAST  Result Date: 08/12/2022 CLINICAL DATA:  Abdominal pain, acute, nonlocalized EXAM: CT ABDOMEN AND PELVIS WITH CONTRAST TECHNIQUE: Multidetector CT imaging of the abdomen and pelvis was performed using the standard protocol following bolus administration of intravenous contrast. RADIATION DOSE REDUCTION: This exam was performed according to the departmental dose-optimization program which includes automated exposure control, adjustment of the mA and/or kV according to patient size and/or use of iterative reconstruction technique. CONTRAST:  163m OMNIPAQUE IOHEXOL 300 MG/ML  SOLN COMPARISON:  07/01/2022 FINDINGS: Lower chest: No pleural or pericardial effusion. Coronary calcifications. Coarse interstitial opacities in the dependent aspect of both lower lobes as before. Hepatobiliary: Interval cholecystectomy. No focal liver lesion or biliary ductal dilatation. Pancreas: No discrete mass or ductal dilatation. Inflammatory changes around the head and body, slightly increased since previous. Spleen: Normal in size without focal abnormality. Adrenals/Urinary Tract: No adrenal mass. Simple appearing cysts in the upper pole left kidney, requiring no imaging follow-up. No worrisome renal mass or hydronephrosis. Ureters decompressed. Urinary bladder incompletely distended. Stomach/Bowel: Stomach is partially distended, unremarkable. The small bowel is decompressed, unremarkable. Appendix not identified. The colon is incompletely distended, unremarkable. Vascular/Lymphatic: Moderate calcified aortoiliac plaque without aneurysm or evident stenosis. No abdominal or pelvic adenopathy. Portal vein patent. Reproductive: Mild prostate enlargement Other: Bilateral pelvic phleboliths. Trace ascites in the pelvis and right pericolic gutter. Inflammatory/edematous changes around the pancreatic head and body, adjacent to the proximal duodenum, ascending colon and  hepatic flexure, extending centrally into the mesentery below the pancreas. No free air. Musculoskeletal: Facet DJD L4-5 allows early grade 1 anterolisthesis. Anterior vertebral endplate spurring at multiple levels in the lower thoracic spine. No acute findings. IMPRESSION: 1. Increase edema/inflammation around the pancreatic head and body, with contiguous inflammatory/edematous changes extending adjacent to the proximal duodenum, ascending colon and hepatic flexure, and into central mesentery suggesting worsening pancreatitis. 2. Fluid in the right pericolic gutter and pelvis,  possibly related to pancreatitis. Bile leak from recent cholecystectomy is considered less likely possibility. 3. Coronary and Aortic Atherosclerosis (ICD10-170.0). Electronically Signed   By: Lucrezia Europe M.D.   On: 08/12/2022 11:13   DG Chest Portable 1 View  Result Date: 08/12/2022 CLINICAL DATA:  Weakness EXAM: PORTABLE CHEST 1 VIEW COMPARISON:  07/01/2022 FINDINGS: Stable cardiomediastinal contours. Increased interstitial markings within the left lung base. No pleural effusion or pneumothorax. IMPRESSION: Increased interstitial markings within the left lung base, atelectasis versus infiltrate. Electronically Signed   By: Davina Poke D.O.   On: 08/12/2022 10:00    Procedures Procedures  {Document cardiac monitor, telemetry assessment procedure when appropriate:1}  Medications Ordered in ED Medications  sodium chloride (PF) 0.9 % injection (has no administration in time range)  0.9 %  sodium chloride infusion (has no administration in time range)  HYDROmorphone (DILAUDID) injection 0.5 mg (has no administration in time range)  ondansetron (ZOFRAN) injection 4 mg (has no administration in time range)  sodium zirconium cyclosilicate (LOKELMA) packet 10 g (has no administration in time range)  sodium chloride 0.9 % bolus 1,000 mL (0 mLs Intravenous Stopped 08/12/22 1114)  iohexol (OMNIPAQUE) 300 MG/ML solution 100 mL (100  mLs Intravenous Contrast Given 08/12/22 1042)    ED Course/ Medical Decision Making/ A&P                           Medical Decision Making Amount and/or Complexity of Data Reviewed Labs: ordered. Radiology: ordered.  Risk Prescription drug management.   This patient presents to the ED with chief complaint(s) of epig pain with pertinent past medical history of recent lap chole, parkinson's dz which further complicates the presenting complaint. The complaint involves an extensive differential diagnosis and also carries with it a high risk of complications and morbidity.    Differential diagnosis includes but is not exclusive to acute cholecystitis, intrathoracic causes for epigastric abdominal pain, gastritis, duodenitis, pancreatitis, small bowel or large bowel obstruction, abdominal aortic aneurysm, hernia, gastritis, etc.  . Serious etiologies were considered.   The initial plan is to screening labs, ivf, imaging   Additional history obtained: Additional history obtained from spouse Records reviewed previous admission documents and prior labs/imagng   Independent labs interpretation:  The following labs were independently interpreted: lipase is 2243, K is 5.2, will give lokelma. Cr is stable at 1.19. Tbili is chronically elevated, similar to baseline today  Independent visualization of imaging: - I independently visualized the following imaging with scope of interpretation limited to determining acute life threatening conditions related to emergency care: CTAP, which revealed acute pancreatitis; worsened from recent CT  Cardiac monitoring was reviewed and interpreted by myself which shows NSR  Treatment and Reassessment: Ivf Analgesics Anti-emetic Lokelma   Consultation: - Consulted or discussed management/test interpretation w/ external professional: ***  Consideration for admission or further workup: Admission was considered ***   Recommend admission for acute /  worsened pancreatitis.   Social Determinants of health: Social History   Tobacco Use   Smoking status: Former    Packs/day: 1.50    Years: 15.00    Total pack years: 22.50    Types: Cigarettes    Quit date: 12/17/1982    Years since quitting: 39.6   Smokeless tobacco: Never   Tobacco comments:    over 40 yrs  Vaping Use   Vaping Use: Never used  Substance Use Topics   Alcohol use: Yes    Comment: very  rare   Drug use: No      {Document critical care time when appropriate:1} {Document review of labs and clinical decision tools ie heart score, Chads2Vasc2 etc:1}  {Document your independent review of radiology images, and any outside records:1} {Document your discussion with family members, caretakers, and with consultants:1} {Document social determinants of health affecting pt's care:1} {Document your decision making why or why not admission, treatments were needed:1} Final Clinical Impression(s) / ED Diagnoses Final diagnoses:  Acute pancreatitis, unspecified complication status, unspecified pancreatitis type    Rx / DC Orders ED Discharge Orders     None

## 2022-08-12 NOTE — Plan of Care (Signed)
  Problem: Clinical Measurements: Goal: Ability to maintain clinical measurements within normal limits will improve Outcome: Progressing Goal: Will remain free from infection Outcome: Progressing Goal: Respiratory complications will improve Outcome: Progressing   

## 2022-08-12 NOTE — H&P (Signed)
History and Physical    Patient: Danny [name]:[id number] DOB: 17-Apr-1951 DOA: 08/12/2022 DOS: the patient was seen and examined on 08/12/2022 PCP: Danny Mons, PA  Patient coming from: Home  Chief Complaint: Abdominal pain.  HPI: Danny [name] is a 71 y.o. male with medical history significant of seasonal allergies, bilateral cataracts, hypertension, GERD, colon polyps, hyperlipidemia, Parkinson's disease, right knee osteoarthritis, obesity, type 2 diabetes who was brought into the emergency department due to generalized weakness, hypotension and abdominal pain.  He received hydromorphone earlier and history is given by his wife.  ED course: Initial vital signs were temperature 98 F, pulse 95, respirations 17, BP 139/77 and O2 sat 98% on room air.  The patient received LR 1000 mL bolus, ondansetron 4 mg IVP, sodium chloride 1000 mL bolus and 0.5 mg of hydromorphone.  Lab work: CBC is her white count 12.2, morning 14.3 g/dL platelets 187.  First troponin level was normal.  CMP showed potassium 5.2 mmol/L, glucose 180 and total bilirubin 1.3 mg/dL.  The rest of the CMP was normal.  Lipase level was 2243 units/L.  Imaging: Portable 1 view chest radiograph showed increased interstitial markings within the left lung base, atelectasis versus infiltrate.  CT abdomen/pelvis with contrast showed increased edema/inflammation around the pancreatic head and body, with continuous inflammatory/edematous changes extending adjacent to the proximal duodenum, ascending colon and hepatic flexure, and into central mesentery suggesting worsening pancreatitis.  There is fluid in the right pericolic gutter and pelvis possibly related to pancreatitis.  Bile leak from recent cholecystectomy is considered less likely.  There was coronary and aortic atherosclerosis.   Review of Systems: As mentioned in the history of present illness. All other systems reviewed and are negative.  Past Medical History:   Diagnosis Date   Allergy    SEASONAL   Cataract    BILATERAL-REMOVED   Essential hypertension, benign    PT.DENIES ON AS PREVENTIVE 08/17/19   GERD (gastroesophageal reflux disease)    Hx of adenomatous polyp of colon 09/04/2019   08/2019 diminutive adenoma No recall given findings and [name]   Mixed hyperlipidemia    PT.DENIES,STATED ON AS PREVENTIVE 07/30/19   Neuromuscular disorder (HCC)    PARKINSONS   OA (osteoarthritis) of knee    right   Obesity, unspecified    Osteoarthritis    KNEE AND SHOULDERS   Parkinsons (Danny [name])    Type II or unspecified type diabetes mellitus without mention of complication, not stated as uncontrolled    Past Surgical History:  Procedure Laterality Date   CATARACT EXTRACTION, BILATERAL     CHOLECYSTECTOMY N/A 07/02/2022   Procedure: LAPAROSCOPIC CHOLECYSTECTOMY WITH ICG DYE  INTRAOPERATIVE CHOLANGIOGRAM;  Surgeon: Danny Keens, Danny [name];  Location: [facility];  Service: General;  Laterality: N/A;   GANGLION CYST EXCISION  [name] 49 years   right   KNEE ARTHROSCOPY     right   KNEE ARTHROSCOPY     TOTAL KNEE ARTHROPLASTY Right 03/08/2020   Procedure: RIGHT TOTAL KNEE ARTHROPLASTY;  Surgeon: Danny Balding, Danny [name];  Location: [facility];  Service: Orthopedics;  Laterality: Right;   WRIST FRACTURE SURGERY     WRIST SURGERY  04/2012   Social History:  reports that he quit smoking about 39 years ago. His smoking use included cigarettes. He has a 22.50 pack-year smoking history. He has never used smokeless tobacco. He reports current alcohol use. He reports that he does not use drugs.  Allergies  Allergen Reactions   Codeine Nausea And Vomiting  Family History  Problem Relation [name] of Onset   Diabetes Mother    Lung cancer Mother    Cancer Father    Heart disease Father    Diabetes Sister    Diabetes Daughter    Colon polyps Son    Colon cancer Neg Hx    Esophageal cancer Neg Hx    Rectal cancer Neg Hx    Stomach cancer Neg Hx    Parkinson's disease Neg  Hx     Prior to Admission medications   Medication Sig Start Date End Date Taking? Authorizing Provider  aspirin 81 MG EC tablet Take 81 mg by mouth daily. Swallow whole.   Yes Provider, Historical, Danny [name]  Bioflavonoid Products (VITAMIN C) CHEW Chew 1 tablet by mouth daily.   Yes Provider, Historical, Danny [name]  carbidopa-levodopa (SINEMET CR) 50-200 MG tablet TAKE 1 TABLET BY MOUTH EVERYDAY AT BEDTIME Patient taking differently: Take 1 tablet by mouth at bedtime. 05/29/22  Yes Danny Age, Danny [name]  carbidopa-levodopa (SINEMET IR) 25-100 MG tablet TAKE 1 TABLET BY MOUTH 6 (SIX) TIMES DAILY. Patient taking differently: Take 1 tablet by mouth See admin instructions. Take 1 tablet by mouth at 8 AM, 10:30 AM, 1 PM, 3:30 PM, 6 PM, and 8:30 PM 03/01/22  Yes Danny Age, Danny [name]  Cinnamon 500 MG capsule Take 500 mg by mouth daily.   Yes Provider, Historical, Danny [name]  famotidine (PEPCID) 20 MG tablet Take 20 mg by mouth in the morning and at bedtime.    Yes Provider, Historical, Danny [name]  fludrocortisone (FLORINEF) 0.1 MG tablet Take 1 tablet (0.1 mg total) by mouth daily. Patient taking differently: Take 0.1 mg by mouth in the morning. 06/27/22  Yes Danny Age, Danny [name]  Ginger, Zingiber officinalis, 550 MG CAPS Take 550 mg by mouth daily.   Yes Provider, Historical, Danny [name]  Glucosamine-Chondroit-Vit C-Mn (GLUCOSAMINE CHONDR 1500 COMPLX PO) Take 1,500 mg by mouth daily.   Yes Provider, Historical, Danny [name]  metFORMIN (GLUCOPHAGE) 1000 MG tablet Take 1 tablet (1,000 mg total) by mouth 2 (two) times daily with a meal. 03/11/20  Yes Danny [name], Danny Craze, PA-C  Multiple Vitamin (MULTIVITAMIN) tablet Take 1 tablet by mouth daily with breakfast.   Yes Provider, Historical, Danny [name]  QUEtiapine (SEROQUEL XR) 50 MG TB24 24 hr tablet Take 50 mg by mouth at bedtime.   Yes Provider, Historical, Danny [name]  simvastatin (ZOCOR) 20 MG tablet Take 20 mg by mouth every Monday, Wednesday, and Friday at 6 PM. 06/26/22  Yes Provider, Historical, Danny [name]  traMADol (ULTRAM) 50 MG tablet Take  1 tablet (50 mg total) by mouth every 12 (twelve) hours as needed for moderate pain or severe pain. Patient not taking: Reported on 08/12/2022 07/04/22   Danny Dike, Danny [name]    Physical Exam: Vitals:   08/12/22 1245 08/12/22 1300 08/12/22 1307 08/12/22 1330  BP: (!) 149/97 (!) 140/84  112/84  Pulse: 93 98  97  Resp: '18 18  20  '$ Temp:   98 F (36.7 C)   TempSrc:   Oral   SpO2: 98% 98%  99%   Physical Exam Vitals and nursing note reviewed.  Constitutional:      General: He is awake. He is not in acute distress.    Appearance: Normal appearance.  HENT:     Head: Normocephalic.     Nose: No rhinorrhea.     Mouth/Throat:     Mouth: Mucous membranes are dry.  Eyes:     General: No scleral icterus.    Pupils: Pupils  are equal, round, and reactive to light.  Neck:     Vascular: No JVD.  Cardiovascular:     Rate and Rhythm: Normal rate and regular rhythm.     Heart sounds: S1 normal and S2 normal.  Pulmonary:     Breath sounds: No wheezing, rhonchi or rales.  Abdominal:     General: Bowel sounds are normal. There is no distension.     Palpations: Abdomen is soft.     Tenderness: There is abdominal tenderness in the epigastric area. There is no guarding or rebound.  Musculoskeletal:     Cervical back: Neck supple.     Right lower leg: No edema.     Left lower leg: No edema.  Skin:    General: Skin is warm and dry.  Neurological:     General: No focal deficit present.     Mental Status: He is alert.  Psychiatric:        Mood and Affect: Mood normal.        Behavior: Behavior normal. Behavior is cooperative.   Data Reviewed:  There are no new results to review at this time.  Assessment and Plan: Principal Problem:   Acute pancreatitis Observation/MedSurg. Continue IV fluids. Keep n.p.o. for now. Analgesics as needed. Antiemetics as needed. Pantoprazole 40 mg IVP daily. Follow CBC, CMP and lipase in AM.  Active Problems:   Diabetes mellitus type 2, uncomplicated  (HCC) Currently NPO. CBG monitoring every 6 hours. CBG monitoring before meals and bedtime once on diet.    Mixed hyperlipidemia Hold simvastatin. Check LFTs in AM. Resume if not abnormal.    Essential hypertension, benign Was hypotensive earlier. As needed antihypertensive.    Parkinson disease (HCC) Continue Sinemet 6 times a day.    Advance Care Planning:   Code Status: DNR   Consults:   Family Communication: His wife was at bedside.  Severity of Illness: The appropriate patient status for this patient is INPATIENT. Inpatient status is judged to be reasonable and necessary in order to provide the required intensity of service to ensure the patient's safety. The patient's presenting symptoms, physical exam findings, and initial radiographic and laboratory data in the context of their chronic comorbidities is felt to place them at high risk for further clinical deterioration. Furthermore, it is not anticipated that the patient will be medically stable for discharge from the hospital within 2 midnights of admission.   * I certify that at the point of admission it is my clinical judgment that the patient will require inpatient hospital care spanning beyond 2 midnights from the point of admission due to high intensity of service, high risk for further deterioration and high frequency of surveillance required.*  Author: Reubin Milan, Danny [name] 08/12/2022 3:20 PM  For on call review [email or web address].   This document was prepared using Dragon voice recognition software and may contain some unintended transcription errors.

## 2022-08-12 NOTE — ED Triage Notes (Signed)
Patient present to the ed with c/o weakness and hypotension.Unable to hold him self up. Patient is also c/o abdominal pain and had MRI yesterday. Patient initial blood pressure was 70/40. Patient was given 500 NS bolus. Current BP upon arrival was 135/90.  20 Left FA arm  CBG 207

## 2022-08-13 ENCOUNTER — Inpatient Hospital Stay (HOSPITAL_COMMUNITY): Payer: Medicare Other

## 2022-08-13 DIAGNOSIS — K859 Acute pancreatitis without necrosis or infection, unspecified: Secondary | ICD-10-CM | POA: Diagnosis not present

## 2022-08-13 LAB — CBC
HCT: 39.3 % (ref 39.0–52.0)
Hemoglobin: 13 g/dL (ref 13.0–17.0)
MCH: 31.9 pg (ref 26.0–34.0)
MCHC: 33.1 g/dL (ref 30.0–36.0)
MCV: 96.6 fL (ref 80.0–100.0)
Platelets: 169 10*3/uL (ref 150–400)
RBC: 4.07 MIL/uL — ABNORMAL LOW (ref 4.22–5.81)
RDW: 14.7 % (ref 11.5–15.5)
WBC: 17.9 10*3/uL — ABNORMAL HIGH (ref 4.0–10.5)
nRBC: 0 % (ref 0.0–0.2)

## 2022-08-13 LAB — URINALYSIS, ROUTINE W REFLEX MICROSCOPIC
Bacteria, UA: NONE SEEN
Bilirubin Urine: NEGATIVE
Glucose, UA: NEGATIVE mg/dL
Hgb urine dipstick: NEGATIVE
Ketones, ur: 20 mg/dL — AB
Leukocytes,Ua: NEGATIVE
Nitrite: NEGATIVE
Protein, ur: 30 mg/dL — AB
Specific Gravity, Urine: 1.042 — ABNORMAL HIGH (ref 1.005–1.030)
pH: 5 (ref 5.0–8.0)

## 2022-08-13 LAB — COMPREHENSIVE METABOLIC PANEL
ALT: 6 U/L (ref 0–44)
AST: 13 U/L — ABNORMAL LOW (ref 15–41)
Albumin: 3 g/dL — ABNORMAL LOW (ref 3.5–5.0)
Alkaline Phosphatase: 55 U/L (ref 38–126)
Anion gap: 6 (ref 5–15)
BUN: 17 mg/dL (ref 8–23)
CO2: 23 mmol/L (ref 22–32)
Calcium: 8.4 mg/dL — ABNORMAL LOW (ref 8.9–10.3)
Chloride: 113 mmol/L — ABNORMAL HIGH (ref 98–111)
Creatinine, Ser: 0.8 mg/dL (ref 0.61–1.24)
GFR, Estimated: >60 mL/min (ref >60)
Glucose, Bld: 108 mg/dL — ABNORMAL HIGH (ref 70–99)
Potassium: 3.7 mmol/L (ref 3.5–5.1)
Sodium: 142 mmol/L (ref 135–145)
Total Bilirubin: 1.5 mg/dL — ABNORMAL HIGH (ref 0.3–1.2)
Total Protein: 5.5 g/dL — ABNORMAL LOW (ref 6.5–8.1)

## 2022-08-13 LAB — HEMOGLOBIN A1C
Hgb A1c MFr Bld: 6.4 % — ABNORMAL HIGH (ref 4.8–5.6)
Mean Plasma Glucose: 136.98 mg/dL

## 2022-08-13 LAB — GLUCOSE, CAPILLARY
Glucose-Capillary: 106 mg/dL — ABNORMAL HIGH (ref 70–99)
Glucose-Capillary: 114 mg/dL — ABNORMAL HIGH (ref 70–99)
Glucose-Capillary: 126 mg/dL — ABNORMAL HIGH (ref 70–99)
Glucose-Capillary: 83 mg/dL (ref 70–99)
Glucose-Capillary: 86 mg/dL (ref 70–99)

## 2022-08-13 LAB — LIPASE, BLOOD: Lipase: 623 U/L — ABNORMAL HIGH (ref 11–51)

## 2022-08-13 LAB — SARS CORONAVIRUS 2 BY RT PCR: SARS Coronavirus 2 by RT PCR: NEGATIVE

## 2022-08-13 MED ORDER — HYDRALAZINE HCL 20 MG/ML IJ SOLN
10.0000 mg | Freq: Four times a day (QID) | INTRAMUSCULAR | Status: DC | PRN
  Administered 2022-08-13: 10 mg via INTRAVENOUS
  Filled 2022-08-13: qty 1

## 2022-08-13 MED ORDER — LACTATED RINGERS IV BOLUS
500.0000 mL | Freq: Once | INTRAVENOUS | Status: AC
  Administered 2022-08-13: 500 mL via INTRAVENOUS

## 2022-08-13 NOTE — Progress Notes (Signed)
PROGRESS NOTE  Danny [name]  DOB: May 23, 1951  PCP: Danny [name], [place]  DOA: 08/12/2022  LOS: 1 day  Hospital Day: 2  Brief narrative: Danny [name] is a 71 y.o. male with PMH significant for DM2, HTN, HLD, Parkinson's disease, GERD, osteoarthritis Patient presented to the ED from home on 8/27 with complaint of weakness, low blood pressure, abdominal pain and is unable to hold himself up.  Per EMS, his initial blood pressure was low at 70/40 and was given 500 mL of normal saline bolus.   Blood pressure improved to 135/90 by the time of presentation.  Afebrile, heart rate 95, breathing on room air. Labs with WC count 12.2, potassium 5.2, lipase more than 2200. Chest x-ray showed increased interstitial markings within the left lung base, atelectasis versus infiltrate  CT abdomen/pelvis with contrast showed increased edema/inflammation around the pancreatic head and body, with continuous inflammatory/edematous changes extending adjacent to the proximal duodenum, ascending colon and hepatic flexure, and into central mesentery suggesting worsening pancreatitis.  There is fluid in the right pericolic gutter and pelvis possibly related to pancreatitis.  Bile leak from recent cholecystectomy is considered less likely.   Of note, the day prior to admission on 8/26, patient had an MRI abdomen done as an outpatient but per some reason, the result is not available in the system.  Subjective: Patient was seen and examined this morning.  Pleasant elderly Caucasian male.  Lying on bed.  Not in distress.  Still continues to have abdominal pain. Chart reviewed.  Episodes of fever overnight Tmax 101.2, blood pressure 150s, breathing on room air Labs this morning showed potassium improved to 3.7, lipase improved to 623, WBC improved to 17.9  Assessment and plan: Acute pancreatitis Presented with abdominal pain, weakness  Lipase level was over 2000.   CT scan abdomen finding as above  showed worsening pancreatitis  Started on conservative management with IV fluid, IV analgesics, IV antiemetics, Protonix Continue the same.  Remains n.p.o. at this time Great improvement in lipase level this morning.  Patient continues to have abdominal tenderness, started on ice chips only for now Recent Labs  Lab 08/12/22 0912 08/13/22 0435  LIPASE 2,243* 623*   HypOtension History of hypertension Initial blood pressure was 70/40 per EMS.  Improved with bolus fluid. Currently on normal saline at 100 mill per hour and blood pressures remained in 140s mostly PTA not on any blood pressure meds.  Continue to monitor.  Type 2 diabetes mellitus A1c 6.4 on 08/05/2022, unchanged in 2 years PTA on metformin 1000 mg twice daily.  Currently on hold Continue sliding scale insulin with Accu-Cheks Lab Results  Component Value Date   HGBA1C 6.4 (H) 08/13/2022   Recent Labs  Lab 08/12/22 0848 08/12/22 1730 08/13/22 0007 08/13/22 0518 08/13/22 1223  GLUCAP 183* 106* 126* 114* 86   Mixed hyperlipidemia PTA on aspirin 81 mg daily, simvastatin 20 mg daily.   Parkinson disease Continue Sinemet 6 times a day.  GERD Currently on PPI  Goals of care   Code Status: DNR    Mobility: Limited mobility at home.  Will obtain PT eval after improvement tomorrow.  Skin assessment:     Nutritional status:  Body mass index is 26.95 kg/m.          Diet:  Diet Order             Diet NPO time specified Except for: Sips with Meds  Diet effective now  DVT prophylaxis:  SCDs Start: 08/12/22 1350   Antimicrobials: None Fluid: NS at 100 mill per hour Consultants: I sent a staff message to general surgery Dr. Ninfa [name] at the request of patient and his wife. Family Communication: Wife at bedside  Status is: Inpatient  Continue in-hospital care because: Needs IV fluid and monitoring for pancreatitis Level of care: Telemetry   Dispo: The patient is from: Home               Anticipated d/c is to: Home hopefully in 2 to 3 days              Patient currently is not medically stable to d/c.   Difficult to place patient No     Infusions:   sodium chloride 100 mL/hr at 08/13/22 1044    Scheduled Meds:  carbidopa-levodopa  1 tablet Oral QHS   carbidopa-levodopa  1 tablet Oral 6 times per day   pantoprazole (PROTONIX) IV  40 mg Intravenous Daily    PRN meds: acetaminophen **OR** acetaminophen, ondansetron **OR** ondansetron (ZOFRAN) IV   Antimicrobials: Anti-infectives (From admission, onward)    None       Objective: Vitals:   08/13/22 0705 08/13/22 1057  BP:    Pulse:    Resp:    Temp: (!) 100.5 F (38.1 C) 98.3 F (36.8 C)  SpO2:      Intake/Output Summary (Last 24 hours) at 08/13/2022 1345 Last data filed at 08/13/2022 1300 Gross per 24 hour  Intake 1539.38 ml  Output 375 ml  Net 1164.38 ml   Filed Weights   08/12/22 1653  Weight: 80.4 kg   Weight change:  Body mass index is 26.95 kg/m.   Physical Exam: General exam: Pleasant, elderly Caucasian male.  Not in physical distress at rest Skin: No rashes, lesions or ulcers. HEENT: Atraumatic, normocephalic, no obvious bleeding Lungs: Clear to auscultation bilaterally CVS: Regular rate and rhythm, no murmur GI/Abd soft, continues to have epigastric tenderness, bowel sound present  CNS: Alert, awake, oriented to place and person.  Parkinson's disease at baseline Psychiatry: Mood appropriate Extremities: No pedal edema, no calf tenderness  Data Review: I have personally reviewed the laboratory data and studies available.  F/u labs ordered Unresulted Labs (From admission, onward)     Start     Ordered   08/14/22 0500  Cancer antigen 19-9  Tomorrow morning,   R        08/13/22 1337   08/13/22 0500  CBC  Daily at 5am,   R      08/12/22 1407   08/13/22 0500  Comprehensive metabolic panel  Daily at 5am,   R      08/12/22 1407   08/13/22 0500  Lipase, blood  Daily  at 5am,   R      08/12/22 1407            Signed, Terrilee Croak, MD [facility] 08/13/2022

## 2022-08-13 NOTE — Progress Notes (Signed)
Pt's covid results are negative. Airborne/contact precautions discontinued.

## 2022-08-14 DIAGNOSIS — K859 Acute pancreatitis without necrosis or infection, unspecified: Secondary | ICD-10-CM | POA: Diagnosis not present

## 2022-08-14 LAB — COMPREHENSIVE METABOLIC PANEL
ALT: <5 U/L (ref 0–44)
AST: 14 U/L — ABNORMAL LOW (ref 15–41)
Albumin: 2.8 g/dL — ABNORMAL LOW (ref 3.5–5.0)
Alkaline Phosphatase: 61 U/L (ref 38–126)
Anion gap: 6 (ref 5–15)
BUN: 16 mg/dL (ref 8–23)
CO2: 23 mmol/L (ref 22–32)
Calcium: 8.3 mg/dL — ABNORMAL LOW (ref 8.9–10.3)
Chloride: 112 mmol/L — ABNORMAL HIGH (ref 98–111)
Creatinine, Ser: 0.74 mg/dL (ref 0.61–1.24)
GFR, Estimated: >60 mL/min (ref >60)
Glucose, Bld: 109 mg/dL — ABNORMAL HIGH (ref 70–99)
Potassium: 3.7 mmol/L (ref 3.5–5.1)
Sodium: 141 mmol/L (ref 135–145)
Total Bilirubin: 1.8 mg/dL — ABNORMAL HIGH (ref 0.3–1.2)
Total Protein: 5.6 g/dL — ABNORMAL LOW (ref 6.5–8.1)

## 2022-08-14 LAB — CBC
HCT: 38.6 % — ABNORMAL LOW (ref 39.0–52.0)
Hemoglobin: 12.9 g/dL — ABNORMAL LOW (ref 13.0–17.0)
MCH: 31.8 pg (ref 26.0–34.0)
MCHC: 33.4 g/dL (ref 30.0–36.0)
MCV: 95.1 fL (ref 80.0–100.0)
Platelets: 167 10*3/uL (ref 150–400)
RBC: 4.06 MIL/uL — ABNORMAL LOW (ref 4.22–5.81)
RDW: 14.6 % (ref 11.5–15.5)
WBC: 19.7 10*3/uL — ABNORMAL HIGH (ref 4.0–10.5)
nRBC: 0 % (ref 0.0–0.2)

## 2022-08-14 LAB — GLUCOSE, CAPILLARY
Glucose-Capillary: 105 mg/dL — ABNORMAL HIGH (ref 70–99)
Glucose-Capillary: 156 mg/dL — ABNORMAL HIGH (ref 70–99)
Glucose-Capillary: 132 mg/dL — ABNORMAL HIGH (ref 70–99)

## 2022-08-14 LAB — LIPASE, BLOOD: Lipase: 94 U/L — ABNORMAL HIGH (ref 11–51)

## 2022-08-14 MED ORDER — IBUPROFEN 200 MG PO TABS
400.0000 mg | ORAL_TABLET | Freq: Four times a day (QID) | ORAL | Status: DC | PRN
Start: 2022-08-14 — End: 2022-08-18
  Administered 2022-08-14 – 2022-08-15 (×2): 400 mg via ORAL
  Filled 2022-08-14 (×2): qty 2

## 2022-08-14 MED ORDER — FLUDROCORTISONE ACETATE 0.1 MG PO TABS
0.1000 mg | ORAL_TABLET | Freq: Every morning | ORAL | Status: DC
  Administered 2022-08-15 – 2022-08-17 (×3): 0.1 mg via ORAL
  Filled 2022-08-14 (×5): qty 1

## 2022-08-14 NOTE — Evaluation (Signed)
Physical Therapy Evaluation Patient Details Name: Danny [name] MRN: [id number] DOB: 1951/04/02 Today's Date: 08/14/2022  History of Present Illness  Danny [name] is a 71 y.o. male with PMH significant for DM2, HTN, HLD, Parkinson's disease, GERD, osteoarthritis  Patient presented to the ED from home on 8/27 with complaint of weakness, low blood pressure, abdominal pain and is unable to hold himself up.CT abdomen/pelvis with contrast showed increased edema/inflammation around the pancreatic head and body, with continuous inflammatory/edematous changes extending adjacent to the proximal duodenum, ascending colon and hepatic flexure, and into central mesentery suggesting worsening pancreatitis.  Clinical Impression  The patient  admitted for above problems. Patient comes from Home, wife present to  provide information.  Per wife. Patient limited ambulation and uses WC mostly.  Wife hopeful to return home.  Pt admitted with above diagnosis.  Pt currently with functional limitations due to the deficits listed below (see PT Problem List). Pt will benefit from skilled PT to increase their independence and safety with mobility to allow discharge to the venue listed below.            Recommendations for follow up therapy are one component of a multi-disciplinary discharge planning process, led by the attending physician.  Recommendations may be updated based on patient status, additional functional criteria and insurance authorization.  Follow Up Recommendations Home health PT      Assistance Recommended at Discharge Frequent or constant Supervision/Assistance  Patient can return home with the following  A lot of help with walking and/or transfers;A lot of help with bathing/dressing/bathroom;Assistance with cooking/housework;Assist for transportation;Help with stairs or ramp for entrance    Equipment Recommendations None recommended by PT  Recommendations for Other Services        Functional Status Assessment Patient has had a recent decline in their functional status and demonstrates the ability to make significant improvements in function in a reasonable and predictable amount of time.     Precautions / Restrictions Precautions Precautions: Fall Precaution Comments: urgency for BM      Mobility  Bed Mobility Overal bed mobility: Needs Assistance Bed Mobility: Supine to Sit, Sit to Supine     Supine to sit: Max assist Sit to supine: Max assist   General bed mobility comments: wife demonstrating how she assists patient with mobility    Transfers Overall transfer level: Needs assistance Equipment used: Rolling walker (2 wheels) Transfers: Sit to/from Stand Sit to Stand: Max assist, +2 safety/equipment, +2 physical assistance           General transfer comment: assist to power up to stand from bed x 2, posterior lean, able to side step x 4 along bed    Ambulation/Gait                  Stairs            Wheelchair Mobility    Modified Rankin (Stroke Patients Only)       Balance Overall balance assessment: Needs assistance Sitting-balance support: Feet supported, Bilateral upper extremity supported Sitting balance-Leahy Scale: Poor   Postural control: Posterior lean Standing balance support: Bilateral upper extremity supported, During functional activity, Reliant on assistive device for balance Standing balance-Leahy Scale: Poor Standing balance comment: posterior lean,                             Pertinent Vitals/Pain Pain Assessment Pain Assessment: No/denies pain    Home Living Family/patient expects to  be discharged to:: Private residence Living Arrangements: Spouse/significant other Available Help at Discharge: Family;Available 24 hours/day Type of Home: House Home Access: Ramped entrance       Home Layout: One level Home Equipment: Conservation officer, nature (2 wheels);BSC/3in1;Wheelchair - manual;Shower  seat      Prior Function               Mobility Comments: Pt's wife assists with sit<>stand and pivot transfers to move bed<>wheelchair. ADLs Comments: most of the time pt's wife assists him with sink bath he used to get into walk in shower with her help. She assists with feeding as his vision limits his ability to feed himself. Wife does all ADL's for him.     Hand Dominance   Dominant Hand: Right    Extremity/Trunk Assessment   Upper Extremity Assessment Upper Extremity Assessment: Overall WFL for tasks assessed    Lower Extremity Assessment Lower Extremity Assessment: Generalized weakness    Cervical / Trunk Assessment Cervical / Trunk Assessment: Kyphotic  Communication   Communication: Expressive difficulties;HOH  Cognition Arousal/Alertness: Awake/alert Behavior During Therapy: WFL for tasks assessed/performed Overall Cognitive Status: History of cognitive impairments - at baseline                                          General Comments      Exercises     Assessment/Plan    PT Assessment Patient needs continued PT services  PT Problem List Decreased strength;Decreased mobility;Decreased activity tolerance;Decreased knowledge of precautions;Decreased balance;Decreased knowledge of use of DME       PT Treatment Interventions DME instruction;Therapeutic activities    PT Goals (Current goals can be found in the Care Plan section)  Acute Rehab PT Goals Patient Stated Goal: per wife, return home PT Goal Formulation: With patient/family Time For Goal Achievement: 08/28/22    Frequency Min 3X/week     Co-evaluation               AM-PAC PT "6 Clicks" Mobility  Outcome Measure Help needed turning from your back to your side while in a flat bed without using bedrails?: A Lot Help needed moving from lying on your back to sitting on the side of a flat bed without using bedrails?: A Lot Help needed moving to and from a bed to a  chair (including a wheelchair)?: A Lot Help needed standing up from a chair using your arms (e.g., wheelchair or bedside chair)?: A Lot Help needed to walk in hospital room?: Total Help needed climbing 3-5 steps with a railing? : Total 6 Click Score: 10    End of Session   Activity Tolerance: Patient tolerated treatment well Patient left: in bed;with call bell/phone within reach;with bed alarm set;with family/visitor present Nurse Communication: Mobility status PT Visit Diagnosis: Unsteadiness on feet (R26.81);Muscle weakness (generalized) (M62.81);Difficulty in walking, not elsewhere classified (R26.2)    Time: 5329-9242 PT Time Calculation (min) (ACUTE ONLY): 18 min   Charges:   PT Evaluation $PT Eval Low Complexity: 1 Low          [place] Office [phone] Weekend [phone]   Claretha Cooper 08/14/2022, 5:05 PM

## 2022-08-14 NOTE — Plan of Care (Signed)
  Problem: Clinical Measurements: Goal: Ability to maintain clinical measurements within normal limits will improve Outcome: Progressing Goal: Respiratory complications will improve Outcome: Progressing Goal: Cardiovascular complication will be avoided Outcome: Progressing   

## 2022-08-14 NOTE — Plan of Care (Signed)

## 2022-08-14 NOTE — Progress Notes (Addendum)
PROGRESS NOTE  Danny [name]  DOB: [birth year]  PCP: [name], [place]  DOA: 08/12/2022  LOS: 2 days  Hospital Day: 3  Brief narrative: Danny [name] is a 71 y.o. male with PMH significant for DM2, HTN, HLD, Parkinson's disease, GERD, osteoarthritis Patient presented to the ED from home on 8/27 with complaint of weakness, low blood pressure, abdominal pain and is unable to hold himself up.  Per EMS, his initial blood pressure was low at 70/40 and was given 500 mL of normal saline bolus.   Blood pressure improved to 135/90 by the time of presentation.  Afebrile, heart rate 95, breathing on room air. Labs with WC count 12.2, potassium 5.2, lipase more than 2200. Chest x-ray showed increased interstitial markings within the left lung base, atelectasis versus infiltrate  CT abdomen/pelvis with contrast showed increased edema/inflammation around the pancreatic head and body, with continuous inflammatory/edematous changes extending adjacent to the proximal duodenum, ascending colon and hepatic flexure, and into central mesentery suggesting worsening pancreatitis.  There is fluid in the right pericolic gutter and pelvis possibly related to pancreatitis.  Bile leak from recent cholecystectomy is considered less likely.   Of note, the day prior to admission on 8/26, patient had an MRI abdomen done as an outpatient but per some reason, the result is not available in the system.  Subjective: Patient was seen and examined this morning. Pleasant elderly Caucasian male.  Lying on bed.  Not in distress.  Still continues to have abdominal pain. Chart reviewed.  Episodes of fever overnight Tmax 101.2, blood pressure 150s, breathing on room air Labs this morning showed potassium improved to 3.7, lipase improved to 623, WBC improved to 17.9  Assessment and plan: Acute pancreatitis Presented with abdominal pain, weakness  Lipase level was over 2000.   CT scan abdomen finding as above  showed worsening pancreatitis  Started on conservative management with IV fluid, IV analgesics, IV antiemetics, Protonix Gradually improving abdominal tenderness, significant improving lipase level.  Started on clear liquid diet today. Recent Labs  Lab 08/12/22 0912 08/13/22 0435 08/14/22 0534  LIPASE 2,243* 623* 94*   HypOtension Initial blood pressure was 70/40 per EMS.  Improved with bolus fluid. Currently on normal saline at 100 mill per hour and blood pressures remained in 140s mostly Patient's wife states that prior to admission, patient was taking fludrocortisone 0.1 mg daily because of persistent hypotension.  I will resume today.    Intermittent low-grade fever Multiple small fever spikes up to 100.8.  Work-up was done last night.  Blood culture was sent.  Urinalysis, COVID PCR, chest x-ray unremarkable.  Low-grade fever could be due to pancreatitis itself.  Type 2 diabetes mellitus A1c 6.4 on 08/05/2022, unchanged in 2 years PTA on metformin 1000 mg twice daily.  Currently on hold Continue sliding scale insulin with Accu-Cheks Recent Labs  Lab 08/13/22 1223 08/13/22 1829 08/13/22 2356 08/14/22 0617 08/14/22 1232  GLUCAP 86 83 106* 105* 156*   Mixed hyperlipidemia PTA on aspirin 81 mg daily, simvastatin 20 mg daily.   Parkinson disease Continue Sinemet 6 times a day.  GERD Currently on PPI  Suspicious pancreatic lesion 8/26, patient had MRI abdomen done after evaluation by surgeon Dr. Nedra Hai as an outpatient.  It showed a discrete hypoenhancing lesion in the mid body of the pancreas with upstream duct dilatation.  I discussed with Dr. Ninfa Linden on 8/28.  Recommended CA 19-9 level.  Pending report.  If positive, his office will arrange follow-up  with Dr. Barry Dienes or Dr. Zenia Resides.  Goals of care   Code Status: DNR    Mobility: Limited mobility at home.  PT ordered  Skin assessment:     Nutritional status:  Body mass index is 26.95 kg/m.           Diet:  Diet Order             Diet clear liquid Room service appropriate? Yes; Fluid consistency: Thin  Diet effective now                   DVT prophylaxis:  SCDs Start: 08/12/22 1350   Antimicrobials: None Fluid: NS at 100 mill per hour Consultants: None currently Family Communication: Wife at bedside  Status is: Inpatient  Continue in-hospital care because: Continues to need IV fluid and monitoring for pancreatitis Level of care: Telemetry   Dispo: The patient is from: Home              Anticipated d/c is to: Home hopefully in 1 to 2 days              Patient currently is not medically stable to d/c.   Difficult to place patient No     Infusions:   sodium chloride 100 mL/hr at 08/14/22 1432    Scheduled Meds:  carbidopa-levodopa  1 tablet Oral QHS   carbidopa-levodopa  1 tablet Oral 6 times per day   [START ON 08/15/2022] fludrocortisone  0.1 mg Oral q AM   pantoprazole (PROTONIX) IV  40 mg Intravenous Daily    PRN meds: acetaminophen **OR** acetaminophen, hydrALAZINE, ondansetron **OR** ondansetron (ZOFRAN) IV   Antimicrobials: Anti-infectives (From admission, onward)    None       Objective: Vitals:   08/14/22 0531 08/14/22 1401  BP: (!) 156/84 (!) 149/79  Pulse: 85 83  Resp:  20  Temp: 99.4 F (37.4 C) 99.6 F (37.6 C)  SpO2:  97%    Intake/Output Summary (Last 24 hours) at 08/14/2022 1442 Last data filed at 08/14/2022 1000 Gross per 24 hour  Intake 3181.59 ml  Output 300 ml  Net 2881.59 ml   Filed Weights   08/12/22 1653  Weight: 80.4 kg   Weight change:  Body mass index is 26.95 kg/m.   Physical Exam: General exam: Pleasant, elderly Caucasian male.  Not in physical distress at rest Skin: No rashes, lesions or ulcers. HEENT: Atraumatic, normocephalic, no obvious bleeding Lungs: Clear to auscultation bilaterally CVS: Regular rate and rhythm, no murmur GI/Abd soft, improving epigastric tenderness, bowel sound present   CNS: Alert, awake, oriented to place and person.  Parkinson's disease at baseline Psychiatry: Mood appropriate Extremities: No pedal edema, no calf tenderness  Data Review: I have personally reviewed the laboratory data and studies available.  F/u labs ordered Unresulted Labs (From admission, onward)     Start     Ordered   08/14/22 0500  Cancer antigen 19-9  Tomorrow morning,   R        08/13/22 1337   08/13/22 0500  CBC  Daily at 5am,   R      08/12/22 1407   08/13/22 0500  Comprehensive metabolic panel  Daily at 5am,   R      08/12/22 1407   08/13/22 0500  Lipase, blood  Daily at 5am,   R      08/12/22 1407            Signed, Terrilee Croak, MD [facility] 08/14/2022

## 2022-08-15 DIAGNOSIS — K859 Acute pancreatitis without necrosis or infection, unspecified: Secondary | ICD-10-CM | POA: Diagnosis not present

## 2022-08-15 LAB — COMPREHENSIVE METABOLIC PANEL
ALT: 6 U/L (ref 0–44)
AST: 15 U/L (ref 15–41)
Albumin: 2.2 g/dL — ABNORMAL LOW (ref 3.5–5.0)
Alkaline Phosphatase: 59 U/L (ref 38–126)
Anion gap: 3 — ABNORMAL LOW (ref 5–15)
BUN: 12 mg/dL (ref 8–23)
CO2: 22 mmol/L (ref 22–32)
Calcium: 7.7 mg/dL — ABNORMAL LOW (ref 8.9–10.3)
Chloride: 113 mmol/L — ABNORMAL HIGH (ref 98–111)
Creatinine, Ser: 0.65 mg/dL (ref 0.61–1.24)
GFR, Estimated: >60 mL/min (ref >60)
Glucose, Bld: 102 mg/dL — ABNORMAL HIGH (ref 70–99)
Potassium: 2.9 mmol/L — ABNORMAL LOW (ref 3.5–5.1)
Sodium: 138 mmol/L (ref 135–145)
Total Bilirubin: 1.7 mg/dL — ABNORMAL HIGH (ref 0.3–1.2)
Total Protein: 4.8 g/dL — ABNORMAL LOW (ref 6.5–8.1)

## 2022-08-15 LAB — LIPID PANEL
Cholesterol: 109 mg/dL (ref 0–200)
HDL: 33 mg/dL — ABNORMAL LOW (ref >40)
LDL Cholesterol: 60 mg/dL (ref 0–99)
Total CHOL/HDL Ratio: 3.3 RATIO
Triglycerides: 80 mg/dL (ref <150)
VLDL: 16 mg/dL (ref 0–40)

## 2022-08-15 LAB — CBC
HCT: 33.8 % — ABNORMAL LOW (ref 39.0–52.0)
Hemoglobin: 11.5 g/dL — ABNORMAL LOW (ref 13.0–17.0)
MCH: 31.9 pg (ref 26.0–34.0)
MCHC: 34 g/dL (ref 30.0–36.0)
MCV: 93.6 fL (ref 80.0–100.0)
Platelets: 170 10*3/uL (ref 150–400)
RBC: 3.61 MIL/uL — ABNORMAL LOW (ref 4.22–5.81)
RDW: 14.4 % (ref 11.5–15.5)
WBC: 15.9 10*3/uL — ABNORMAL HIGH (ref 4.0–10.5)
nRBC: 0 % (ref 0.0–0.2)

## 2022-08-15 LAB — CANCER ANTIGEN 19-9: CA 19-9: 587 U/mL — ABNORMAL HIGH (ref 0–35)

## 2022-08-15 LAB — GLUCOSE, CAPILLARY
Glucose-Capillary: 135 mg/dL — ABNORMAL HIGH (ref 70–99)
Glucose-Capillary: 81 mg/dL (ref 70–99)
Glucose-Capillary: 156 mg/dL — ABNORMAL HIGH (ref 70–99)
Glucose-Capillary: 150 mg/dL — ABNORMAL HIGH (ref 70–99)

## 2022-08-15 LAB — LIPASE, BLOOD: Lipase: 42 U/L (ref 11–51)

## 2022-08-15 MED ORDER — POTASSIUM CHLORIDE CRYS ER 20 MEQ PO TBCR
40.0000 meq | EXTENDED_RELEASE_TABLET | ORAL | Status: AC
  Administered 2022-08-15 (×2): 40 meq via ORAL
  Filled 2022-08-15 (×2): qty 2

## 2022-08-15 MED ORDER — PANTOPRAZOLE SODIUM 40 MG PO TBEC
40.0000 mg | DELAYED_RELEASE_TABLET | Freq: Every day | ORAL | Status: DC
  Administered 2022-08-16 – 2022-08-17 (×2): 40 mg via ORAL
  Filled 2022-08-15 (×2): qty 1

## 2022-08-15 NOTE — Plan of Care (Signed)

## 2022-08-15 NOTE — Progress Notes (Signed)
PROGRESS NOTE    Danny [name]  IRC:[id number] DOB: [birth year] DOA: 08/12/2022 PCP: [name], PA   Brief Narrative:  Danny [name] is a 71 y.o. male with PMH significant for DM2, HTN, HLD, Parkinson's disease, GERD, osteoarthritis Patient presented to the ED from home on 8/27 with complaint of weakness, low blood pressure, abdominal pain and is unable to hold himself up.  Per EMS, his initial blood pressure was low at 70/40 and was given 500 mL of normal saline bolus.   Blood pressure improved to 135/90 by the time of presentation.  Afebrile, heart rate 95, breathing on room air. Labs with WC count 12.2, potassium 5.2, lipase more than 2200. Chest x-ray showed increased interstitial markings within the left lung base, atelectasis versus infiltrate   CT abdomen/pelvis with contrast showed increased edema/inflammation around the pancreatic head and body, with continuous inflammatory/edematous changes extending adjacent to the proximal duodenum, ascending colon and hepatic flexure, and into central mesentery suggesting worsening pancreatitis.  There is fluid in the right pericolic gutter and pelvis possibly related to pancreatitis.  Bile leak from recent cholecystectomy is considered less likely.   Of note, the day prior to admission on 8/26, patient had an MRI abdomen done as an outpatient but per some reason, the result is not available in the system.  Assessment & Plan:   Principal Problem:   Acute pancreatitis Active Problems:   Diabetes mellitus type 2, uncomplicated (HCC)   Mixed hyperlipidemia   Essential hypertension, benign   Parkinson disease ([place])  Acute pancreatitis: Presented with abdominal pain, weakness  Lipase level was over 2000.   CT scan abdomen finding as above showed worsening pancreatitis  Started on conservative management with IV fluid, IV analgesics, IV antiemetics, Protonix.  Pain almost resolved.  No epic check tenderness on exam, with minimal  right quadrant tenderness.  No nausea.  Will advance to full liquid diet now and then soft diet later.  Will check lipid panel as well.  Hypotension: Initial blood pressure was 70/40 per EMS.  Improved with bolus fluid. Currently on normal saline at 100 mill per hour and blood pressures remained in 140s mostly.  Will stop IV fluids.  Continue fludrocortisone.   Intermittent low-grade fever: Blood culture was sent.  Urinalysis, COVID PCR, chest x-ray unremarkable.  Last temperature of 101.3 at 10 PM on 08/14/2022.  Leukocytosis improving.  Will monitor for now.   Type 2 diabetes mellitus A1c 6.4 on 08/05/2022, unchanged in 2 years PTA on metformin 1000 mg twice daily.  Currently on hold Continue sliding scale insulin with Accu-Cheks  Mixed hyperlipidemia PTA on aspirin 81 mg daily, simvastatin 20 mg daily.   Parkinson disease Continue Sinemet 6 times a day.   GERD Currently on PPI   Suspicious pancreatic lesion 8/26, patient had MRI abdomen done after evaluation by surgeon Dr. Nedra Hai as an outpatient.  It showed a discrete hypoenhancing lesion in the mid body of the pancreas with upstream duct dilatation.  I discussed with Dr. Ninfa Linden on 8/28.  Recommended CA 19-9 level.  Pending report.  If positive, his office will arrange follow-up with Dr. Barry Dienes or Dr. Zenia Resides.   DVT prophylaxis: SCDs Start: 08/12/22 1350   Code Status: DNR  Family Communication: Wife present at bedside.  Plan of care discussed with patient in length and he/she verbalized understanding and agreed with it.  Status is: Inpatient Remains inpatient appropriate because: Advancing diet slowly and having low-grade fever.   Estimated body mass index  is 26.95 kg/m as calculated from the following:   Height as of this encounter: '5\' 8"'$  (1.727 m).   Weight as of this encounter: 80.4 kg.    Nutritional Assessment: Body mass index is 26.95 kg/m.Marland Kitchen Seen by dietician.  I agree with the assessment and plan as  outlined below: Nutrition Status:        . Skin Assessment: I have examined the patient's skin and I agree with the wound assessment as performed by the wound care RN as outlined below:    Consultants:  None  Procedures:  None  Antimicrobials:  Anti-infectives (From admission, onward)    None         Subjective: Patient seen and examined.  Wife at the bedside.  He states that he has no more abdominal pain.  No other complaint.  Objective: Vitals:   08/14/22 2219 08/15/22 0018 08/15/22 0118 08/15/22 0417  BP:    (!) 142/83  Pulse:    75  Resp:    18  Temp: (!) 101.3 F (38.5 C) 100.3 F (37.9 C) 99.2 F (37.3 C) 99.1 F (37.3 C)  TempSrc: Oral Oral Oral Oral  SpO2:    96%  Weight:      Height:        Intake/Output Summary (Last 24 hours) at 08/15/2022 1138 Last data filed at 08/15/2022 0800 Gross per 24 hour  Intake 2749.02 ml  Output 1451 ml  Net 1298.02 ml   Filed Weights   08/12/22 1653  Weight: 80.4 kg    Examination:  General exam: Appears calm and comfortable  Respiratory system: Clear to auscultation. Respiratory effort normal. Cardiovascular system: S1 & S2 heard, RRR. No JVD, murmurs, rubs, gallops or clicks. No pedal edema. Gastrointestinal system: Abdomen is nondistended, soft and nontender. No organomegaly or masses felt. Normal bowel sounds heard. Central nervous system: Alert and oriented. No focal neurological deficits. Extremities: Symmetric 5 x 5 power. Skin: No rashes, lesions or ulcers Psychiatry: Judgement and insight appear normal. Mood & affect appropriate.    Data Reviewed: I have personally reviewed following labs and imaging studies  CBC: Recent Labs  Lab 08/12/22 0912 08/13/22 0435 08/14/22 0534 08/15/22 0449  WBC 12.2* 17.9* 19.7* 15.9*  NEUTROABS 10.6*  --   --   --   HGB 14.3 13.0 12.9* 11.5*  HCT 43.3 39.3 38.6* 33.8*  MCV 96.4 96.6 95.1 93.6  PLT 187 169 167 812   Basic Metabolic Panel: Recent Labs   Lab 08/12/22 0912 08/13/22 0435 08/14/22 0534 08/15/22 0449  NA 142 142 141 138  K 5.2* 3.7 3.7 2.9*  CL 110 113* 112* 113*  CO2 '25 23 23 22  '$ GLUCOSE 180* 108* 109* 102*  BUN '18 17 16 12  '$ CREATININE 1.19 0.80 0.74 0.65  CALCIUM 9.5 8.4* 8.3* 7.7*   GFR: Estimated Creatinine Clearance: 83.1 mL/min (by C-G formula based on SCr of 0.65 mg/dL). Liver Function Tests: Recent Labs  Lab 08/12/22 0912 08/13/22 0435 08/14/22 0534 08/15/22 0449  AST 16 13* 14* 15  ALT 6 6 <5 6  ALKPHOS 65 55 61 59  BILITOT 1.3* 1.5* 1.8* 1.7*  PROT 6.6 5.5* 5.6* 4.8*  ALBUMIN 3.7 3.0* 2.8* 2.2*   Recent Labs  Lab 08/12/22 0912 08/13/22 0435 08/14/22 0534 08/15/22 0449  LIPASE 2,243* 623* 94* 42   No results for input(s): "AMMONIA" in the last 168 hours. Coagulation Profile: No results for input(s): "INR", "PROTIME" in the last 168 hours. Cardiac Enzymes: No  results for input(s): "CKTOTAL", "CKMB", "CKMBINDEX", "TROPONINI" in the last 168 hours. BNP (last 3 results) No results for input(s): "PROBNP" in the last 8760 hours. HbA1C: Recent Labs    08/13/22 0435  HGBA1C 6.4*   CBG: Recent Labs  Lab 08/14/22 0617 08/14/22 1232 08/14/22 1833 08/15/22 0117 08/15/22 0615  GLUCAP 105* 156* 132* 135* 81   Lipid Profile: Recent Labs    08/15/22 0449  CHOL 109  HDL 33*  LDLCALC 60  TRIG 80  CHOLHDL 3.3   Thyroid Function Tests: No results for input(s): "TSH", "T4TOTAL", "FREET4", "T3FREE", "THYROIDAB" in the last 72 hours. Anemia Panel: No results for input(s): "VITAMINB12", "FOLATE", "FERRITIN", "TIBC", "IRON", "RETICCTPCT" in the last 72 hours. Sepsis Labs: No results for input(s): "PROCALCITON", "LATICACIDVEN" in the last 168 hours.  Recent Results (from the past 240 hour(s))  SARS Coronavirus 2 by RT PCR (hospital order, performed in [facility] hospital lab) *cepheid single result test* Anterior Nasal Swab     Status: None   Collection Time: 08/13/22 12:53 AM    Specimen: Anterior Nasal Swab  Result Value Ref Range Status   SARS Coronavirus 2 by RT PCR NEGATIVE NEGATIVE Final    Comment: (NOTE) SARS-CoV-2 target nucleic acids are NOT DETECTED.  The SARS-CoV-2 RNA is generally detectable in upper and lower respiratory specimens during the acute phase of infection. The lowest concentration of SARS-CoV-2 viral copies this assay can detect is 250 copies / mL. A negative result does not preclude SARS-CoV-2 infection and should not be used as the sole basis for treatment or other patient management decisions.  A negative result may occur with improper specimen collection / handling, submission of specimen other than nasopharyngeal swab, presence of viral mutation(s) within the areas targeted by this assay, and inadequate number of viral copies (<250 copies / mL). A negative result must be combined with clinical observations, patient history, and epidemiological information.  Fact Sheet for Patients:   https://www.patel.info/  Fact Sheet for Healthcare Providers: https://hall.com/  This test is not yet approved or  cleared by the [place] FDA and has been authorized for detection and/or diagnosis of SARS-CoV-2 by FDA under an Emergency Use Authorization (EUA).  This EUA will remain in effect (meaning this test can be used) for the duration of the COVID-19 declaration under Section 564(b)(1) of the Act, 21 U.S.C. section 360bbb-3(b)(1), unless the authorization is terminated or revoked sooner.  Performed at [facility], [place] [address], [place], [place] [address]   Culture, blood (Routine X 2) w Reflex to ID Panel     Status: None (Preliminary result)   Collection Time: 08/13/22  4:35 AM   Specimen: BLOOD  Result Value Ref Range Status   Specimen Description   Final    BLOOD BLOOD LEFT ARM Performed at [facility] [address] [place], [place]  [address]    Special Requests   Final    BOTTLES DRAWN AEROBIC AND ANAEROBIC Blood Culture adequate volume Performed at [facility] [address], [place], [place] [address]    Culture   Final    NO GROWTH 2 DAYS Performed at [facility] [address] [place], [place] [address]    Report Status PENDING  Incomplete  Culture, blood (Routine X 2) w Reflex to ID Panel     Status: None (Preliminary result)   Collection Time: 08/13/22  4:42 AM   Specimen: BLOOD  Result Value Ref Range Status  Specimen Description   Final    BLOOD BLOOD RIGHT HAND Performed at [facility] [address] [place], [place] [address]    Special Requests   Final    BOTTLES DRAWN AEROBIC ONLY Blood Culture adequate volume Performed at [facility] [address] [place], [place] [address]    Culture   Final    NO GROWTH 2 DAYS Performed at [facility] [address] [place], [place] [address]    Report Status PENDING  Incomplete     Radiology Studies: No results found.  Scheduled Meds:  carbidopa-levodopa  1 tablet Oral QHS   carbidopa-levodopa  1 tablet Oral 6 times per day   fludrocortisone  0.1 mg Oral q AM   pantoprazole (PROTONIX) IV  40 mg Intravenous Daily   potassium chloride  40 mEq Oral Q4H   Continuous Infusions:  sodium chloride 100 mL/hr at 08/15/22 1020     LOS: 3 days   Darliss Cheney, MD [facility]  08/15/2022, 11:38 AM   *Please note that this is a verbal dictation therefore any spelling or grammatical errors are due to the "[place]" system interpretation.  Please page via [place] and do not message via secure chat for urgent patient care matters. Secure chat can be used for non urgent patient care matters.  How to contact the [facility] Attending or Consulting provider [place] or covering provider during after hours [place], for this patient?  Check the care team in [facility] and look for a)  attending/consulting [facility] provider listed and b) the [facility] team listed. Page or secure chat [phone]. Log into [email or web address] and use [facility]'s universal password to access. If you do not have the password, please contact the hospital operator. Locate the [facility] provider you are looking for under [facility] and page to a number that you can be directly reached. If you still have difficulty reaching the provider, please page the [facility] (Director on Call) for the Hospitalists listed on amion for assistance.

## 2022-08-15 NOTE — Plan of Care (Signed)
  Problem: Clinical Measurements: Goal: Diagnostic test results will improve Outcome: Progressing Goal: Respiratory complications will improve Outcome: Progressing Goal: Cardiovascular complication will be avoided Outcome: Progressing   

## 2022-08-15 NOTE — Progress Notes (Signed)
Patient had temp of 101.5 during night shift. Administered PRN Tylenol, applied ice packs, removed blankets and reduced temp in room, which was ineffective. MD notified. New orders for Motrin received and carried out. Motrin effective in reducing temp.

## 2022-08-16 DIAGNOSIS — K859 Acute pancreatitis without necrosis or infection, unspecified: Secondary | ICD-10-CM | POA: Diagnosis not present

## 2022-08-16 LAB — BASIC METABOLIC PANEL
Anion gap: 8 (ref 5–15)
BUN: 11 mg/dL (ref 8–23)
CO2: 22 mmol/L (ref 22–32)
Calcium: 8.2 mg/dL — ABNORMAL LOW (ref 8.9–10.3)
Chloride: 108 mmol/L (ref 98–111)
Creatinine, Ser: 0.71 mg/dL (ref 0.61–1.24)
GFR, Estimated: >60 mL/min (ref >60)
Glucose, Bld: 199 mg/dL — ABNORMAL HIGH (ref 70–99)
Potassium: 3.3 mmol/L — ABNORMAL LOW (ref 3.5–5.1)
Sodium: 138 mmol/L (ref 135–145)

## 2022-08-16 LAB — CBC WITH DIFFERENTIAL/PLATELET
Abs Immature Granulocytes: 0.09 10*3/uL — ABNORMAL HIGH (ref 0.00–0.07)
Basophils Absolute: 0 10*3/uL (ref 0.0–0.1)
Basophils Relative: 0 %
Eosinophils Absolute: 0.7 10*3/uL — ABNORMAL HIGH (ref 0.0–0.5)
Eosinophils Relative: 5 %
HCT: 35.1 % — ABNORMAL LOW (ref 39.0–52.0)
Hemoglobin: 12 g/dL — ABNORMAL LOW (ref 13.0–17.0)
Immature Granulocytes: 1 %
Lymphocytes Relative: 9 %
Lymphs Abs: 1.3 10*3/uL (ref 0.7–4.0)
MCH: 31.5 pg (ref 26.0–34.0)
MCHC: 34.2 g/dL (ref 30.0–36.0)
MCV: 92.1 fL (ref 80.0–100.0)
Monocytes Absolute: 1 10*3/uL (ref 0.1–1.0)
Monocytes Relative: 7 %
Neutro Abs: 10.4 10*3/uL — ABNORMAL HIGH (ref 1.7–7.7)
Neutrophils Relative %: 78 %
Platelets: 199 10*3/uL (ref 150–400)
RBC: 3.81 MIL/uL — ABNORMAL LOW (ref 4.22–5.81)
RDW: 14.4 % (ref 11.5–15.5)
WBC: 13.4 10*3/uL — ABNORMAL HIGH (ref 4.0–10.5)
nRBC: 0 % (ref 0.0–0.2)

## 2022-08-16 LAB — GLUCOSE, CAPILLARY
Glucose-Capillary: 128 mg/dL — ABNORMAL HIGH (ref 70–99)
Glucose-Capillary: 162 mg/dL — ABNORMAL HIGH (ref 70–99)
Glucose-Capillary: 164 mg/dL — ABNORMAL HIGH (ref 70–99)
Glucose-Capillary: 181 mg/dL — ABNORMAL HIGH (ref 70–99)
Glucose-Capillary: 183 mg/dL — ABNORMAL HIGH (ref 70–99)

## 2022-08-16 MED ORDER — HYDRALAZINE HCL 20 MG/ML IJ SOLN
10.0000 mg | Freq: Four times a day (QID) | INTRAMUSCULAR | Status: DC | PRN
Start: 2022-08-16 — End: 2022-08-18

## 2022-08-16 MED ORDER — AMLODIPINE BESYLATE 5 MG PO TABS
5.0000 mg | ORAL_TABLET | Freq: Every day | ORAL | Status: DC
  Administered 2022-08-16 – 2022-08-17 (×2): 5 mg via ORAL
  Filled 2022-08-16 (×2): qty 1

## 2022-08-16 MED ORDER — INSULIN ASPART 100 UNIT/ML IJ SOLN
0.0000 [IU] | Freq: Three times a day (TID) | INTRAMUSCULAR | Status: DC
  Administered 2022-08-16: 2 [IU] via SUBCUTANEOUS
  Administered 2022-08-17: 3 [IU] via SUBCUTANEOUS
  Administered 2022-08-17: 2 [IU] via SUBCUTANEOUS

## 2022-08-16 MED ORDER — POTASSIUM CHLORIDE CRYS ER 20 MEQ PO TBCR
40.0000 meq | EXTENDED_RELEASE_TABLET | Freq: Once | ORAL | Status: AC
  Administered 2022-08-16: 40 meq via ORAL
  Filled 2022-08-16: qty 2

## 2022-08-16 MED ORDER — INSULIN ASPART 100 UNIT/ML IJ SOLN: 0.0000 [IU] | Freq: Every day | INTRAMUSCULAR | Status: DC

## 2022-08-16 NOTE — Progress Notes (Addendum)
PROGRESS NOTE    Danny [name]  XAJ:[id number] DOB: 09/20/1951 DOA: 08/12/2022 PCP: [name], PA   Brief Narrative:  Danny [name] is a 71 y.o. male with PMH significant for DM2, HTN, HLD, Parkinson's disease, GERD, osteoarthritis Patient presented to the ED from home on 8/27 with complaint of weakness, low blood pressure, abdominal pain and is unable to hold himself up.  Per EMS, his initial blood pressure was low at 70/40 and was given 500 mL of normal saline bolus.   Blood pressure improved to 135/90 by the time of presentation.  Afebrile, heart rate 95, breathing on room air. Labs with WC count 12.2, potassium 5.2, lipase more than 2200. Chest x-ray showed increased interstitial markings within the left lung base, atelectasis versus infiltrate   CT abdomen/pelvis with contrast showed increased edema/inflammation around the pancreatic head and body, with continuous inflammatory/edematous changes extending adjacent to the proximal duodenum, ascending colon and hepatic flexure, and into central mesentery suggesting worsening pancreatitis.  There is fluid in the right pericolic gutter and pelvis possibly related to pancreatitis.  Bile leak from recent cholecystectomy is considered less likely.   Of note, the day prior to admission on 8/26, patient had an MRI abdomen done as an outpatient but per some reason, the result is not available in the system.  Assessment & Plan:   Principal Problem:   Acute pancreatitis Active Problems:   Diabetes mellitus type 2, uncomplicated (HCC)   Mixed hyperlipidemia   Essential hypertension, benign   Parkinson disease ([place])  Acute pancreatitis: Presented with abdominal pain, weakness  Lipase level was over 2000.   CT scan abdomen finding as above showed worsening pancreatitis  Started on conservative management with IV fluid, IV analgesics, IV antiemetics, Protonix.  Pain resolved.  Tolerating soft diet.  Will advance to cardiac  diet.  Hypokalemia: We will replace.  Hypotension: Initial blood pressure was 70/40 per EMS.  Improved with bolus fluid. Now blood pressure rising.  Will start on amlodipine 5 mg.  Add as needed hydralazine.   Intermittent low-grade fever: Blood culture was sent.  Urinalysis, COVID PCR, chest x-ray unremarkable.  Last temperature of 101.3 at 10 PM on 08/14/2022.  Leukocytosis improving.  Will monitor for now.   Type 2 diabetes mellitus A1c 6.4 on 08/05/2022, unchanged in 2 years PTA on metformin 1000 mg twice daily.  Currently on hold Continue sliding scale insulin with Accu-Cheks  Mixed hyperlipidemia PTA on aspirin 81 mg daily, simvastatin 20 mg daily.   Parkinson disease Continue Sinemet 6 times a day.   GERD Currently on PPI   Suspicious pancreatic lesion 8/26, patient had MRI abdomen done after evaluation by surgeon Dr. Nedra Hai as an outpatient.  It showed a discrete hypoenhancing lesion in the mid body of the pancreas with upstream duct dilatation.  I discussed with Dr. Ninfa [place] on 8/28.  Recommended CA 19-9 level.  Pending report.  If positive, his office will arrange follow-up with Dr. Barry Dienes or Dr. Zenia Resides.  Generalized weakness: Wife concerned about generalized weakness.  Per her request, PT was reconsulted.  Initially they had recommended home with home health PT and now they are recommending SNF.  TOC has been consulted.  DVT prophylaxis: SCDs Start: 08/12/22 1350   Code Status: DNR  Family Communication: Wife present at bedside.  Plan of care discussed with patient in length and he/she verbalized understanding and agreed with it.  Status is: Inpatient Remains inpatient appropriate because: Medically stable.  Waiting for placement.  Estimated body mass index is 26.95 kg/m as calculated from the following:   Height as of this encounter: '5\' 8"'$  (1.727 m).   Weight as of this encounter: 80.4 kg.    Nutritional Assessment: Body mass index is 26.95 kg/m.Marland Kitchen Seen  by dietician.  I agree with the assessment and plan as outlined below: Nutrition Status:        . Skin Assessment: I have examined the patient's skin and I agree with the wound assessment as performed by the wound care RN as outlined below:    Consultants:  None  Procedures:  None  Antimicrobials:  Anti-infectives (From admission, onward)    None         Subjective:  Patient seen and examined.  He has no complaints.  No abdominal pain or nausea.  Wife at the bedside.  Objective: Vitals:   08/16/22 0432 08/16/22 1051 08/16/22 1052 08/16/22 1205  BP: (!) 165/89 (!) 168/89 (!) 168/89 (!) 161/91  Pulse: 83 81 82 77  Resp: '18 16 16 20  '$ Temp: 99.5 F (37.5 C)   99.1 F (37.3 C)  TempSrc: Oral   Oral  SpO2: 95% 97% 97% 97%  Weight:      Height:        Intake/Output Summary (Last 24 hours) at 08/16/2022 1335 Last data filed at 08/15/2022 2221 Gross per 24 hour  Intake 843.02 ml  Output 450 ml  Net 393.02 ml    Filed Weights   08/12/22 1653  Weight: 80.4 kg    Examination:  General exam: Appears calm and comfortable  Respiratory system: Clear to auscultation. Respiratory effort normal. Cardiovascular system: S1 & S2 heard, RRR. No JVD, murmurs, rubs, gallops or clicks. No pedal edema. Gastrointestinal system: Abdomen is nondistended, soft and mild right upper quadrant tenderness. No organomegaly or masses felt. Normal bowel sounds heard. Central nervous system: Alert and oriented. No focal neurological deficits. Extremities: Symmetric 5 x 5 power. Skin: No rashes, lesions or ulcers.  Psychiatry: Judgement and insight appear normal. Mood & affect appropriate.   Data Reviewed: I have personally reviewed following labs and imaging studies  CBC: Recent Labs  Lab 08/12/22 0912 08/13/22 0435 08/14/22 0534 08/15/22 0449 08/16/22 1000  WBC 12.2* 17.9* 19.7* 15.9* 13.4*  NEUTROABS 10.6*  --   --   --  10.4*  HGB 14.3 13.0 12.9* 11.5* 12.0*  HCT 43.3 39.3  38.6* 33.8* 35.1*  MCV 96.4 96.6 95.1 93.6 92.1  PLT 187 169 167 170 644    Basic Metabolic Panel: Recent Labs  Lab 08/12/22 0912 08/13/22 0435 08/14/22 0534 08/15/22 0449 08/16/22 1000  NA 142 142 141 138 138  K 5.2* 3.7 3.7 2.9* 3.3*  CL 110 113* 112* 113* 108  CO2 '25 23 23 22 22  '$ GLUCOSE 180* 108* 109* 102* 199*  BUN '18 17 16 12 11  '$ CREATININE 1.19 0.80 0.74 0.65 0.71  CALCIUM 9.5 8.4* 8.3* 7.7* 8.2*    GFR: Estimated Creatinine Clearance: 83.1 mL/min (by C-G formula based on SCr of 0.71 mg/dL). Liver Function Tests: Recent Labs  Lab 08/12/22 0912 08/13/22 0435 08/14/22 0534 08/15/22 0449  AST 16 13* 14* 15  ALT 6 6 <5 6  ALKPHOS 65 55 61 59  BILITOT 1.3* 1.5* 1.8* 1.7*  PROT 6.6 5.5* 5.6* 4.8*  ALBUMIN 3.7 3.0* 2.8* 2.2*    Recent Labs  Lab 08/12/22 0912 08/13/22 0435 08/14/22 0534 08/15/22 0449  LIPASE 2,243* 623* 94* 42    No  results for input(s): "AMMONIA" in the last 168 hours. Coagulation Profile: No results for input(s): "INR", "PROTIME" in the last 168 hours. Cardiac Enzymes: No results for input(s): "CKTOTAL", "CKMB", "CKMBINDEX", "TROPONINI" in the last 168 hours. BNP (last 3 results) No results for input(s): "PROBNP" in the last 8760 hours. HbA1C: No results for input(s): "HGBA1C" in the last 72 hours.  CBG: Recent Labs  Lab 08/15/22 1219 08/15/22 1806 08/16/22 0022 08/16/22 0622 08/16/22 1202  GLUCAP 156* 150* 181* 128* 183*    Lipid Profile: Recent Labs    08/15/22 0449  CHOL 109  HDL 33*  LDLCALC 60  TRIG 80  CHOLHDL 3.3    Thyroid Function Tests: No results for input(s): "TSH", "T4TOTAL", "FREET4", "T3FREE", "THYROIDAB" in the last 72 hours. Anemia Panel: No results for input(s): "VITAMINB12", "FOLATE", "FERRITIN", "TIBC", "IRON", "RETICCTPCT" in the last 72 hours. Sepsis Labs: No results for input(s): "PROCALCITON", "LATICACIDVEN" in the last 168 hours.  Recent Results (from the past 240 hour(s))  SARS  Coronavirus 2 by RT PCR (hospital order, performed in [facility] hospital lab) *cepheid single result test* Anterior Nasal Swab     Status: None   Collection Time: 08/13/22 12:53 AM   Specimen: Anterior Nasal Swab  Result Value Ref Range Status   SARS Coronavirus 2 by RT PCR NEGATIVE NEGATIVE Final    Comment: (NOTE) SARS-CoV-2 target nucleic acids are NOT DETECTED.  The SARS-CoV-2 RNA is generally detectable in upper and lower respiratory specimens during the acute phase of infection. The lowest concentration of SARS-CoV-2 viral copies this assay can detect is 250 copies / mL. A negative result does not preclude SARS-CoV-2 infection and should not be used as the sole basis for treatment or other patient management decisions.  A negative result may occur with improper specimen collection / handling, submission of specimen other than nasopharyngeal swab, presence of viral mutation(s) within the areas targeted by this assay, and inadequate number of viral copies (<250 copies / mL). A negative result must be combined with clinical observations, patient history, and epidemiological information.  Fact Sheet for Patients:   https://www.patel.info/  Fact Sheet for Healthcare Providers: https://hall.com/  This test is not yet approved or  cleared by the [place] FDA and has been authorized for detection and/or diagnosis of SARS-CoV-2 by FDA under an Emergency Use Authorization (EUA).  This EUA will remain in effect (meaning this test can be used) for the duration of the COVID-19 declaration under Section 564(b)(1) of the Act, 21 U.S.C. section 360bbb-3(b)(1), unless the authorization is terminated or revoked sooner.  Performed at [facility], [place] [address], [place], [place] [address]   Culture, blood (Routine X 2) w Reflex to ID Panel     Status: None (Preliminary result)   Collection Time: 08/13/22  4:35 AM    Specimen: BLOOD  Result Value Ref Range Status   Specimen Description   Final    BLOOD BLOOD LEFT ARM Performed at [facility] [address], [place], [place] [address]    Special Requests   Final    BOTTLES DRAWN AEROBIC AND ANAEROBIC Blood Culture adequate volume Performed at [facility] [address] [place], [place] [address]    Culture   Final    NO GROWTH 3 DAYS Performed at [facility], [place] [address] [place], [place] [address]    Report Status PENDING  Incomplete  Culture, blood (Routine X 2) w Reflex to ID Panel  Status: None (Preliminary result)   Collection Time: 08/13/22  4:42 AM   Specimen: BLOOD  Result Value Ref Range Status   Specimen Description   Final    BLOOD BLOOD RIGHT HAND Performed at [facility] [address], [place], [place] [address]    Special Requests   Final    BOTTLES DRAWN AEROBIC ONLY Blood Culture adequate volume Performed at [facility] [address] [place], [place] [address]    Culture   Final    NO GROWTH 3 DAYS Performed at [facility], [place] [address] [place], [place] [address]    Report Status PENDING  Incomplete     Radiology Studies: No results found.  Scheduled Meds:  amLODipine  5 mg Oral Daily   carbidopa-levodopa  1 tablet Oral QHS   carbidopa-levodopa  1 tablet Oral 6 times per day   fludrocortisone  0.1 mg Oral q AM   pantoprazole  40 mg Oral Daily   Continuous Infusions:     LOS: 4 days   Darliss Cheney, MD [facility]  08/16/2022, 1:35 PM   *Please note that this is a verbal dictation therefore any spelling or grammatical errors are due to the "[place] [facility]" system interpretation.  Please page via [place] and do not message via secure chat for urgent patient care matters. Secure chat can be used for non urgent patient care matters.  How to contact the [facility] Attending or Consulting provider [place] or covering provider during after hours [place], for this patient?  Check the care team in [facility] and look for a) attending/consulting TRH provider listed and b) the [facility] team listed. Page or secure chat [phone]. Log into [email or web address] and use [place]'s universal password to access. If you do not have the password, please contact the hospital operator. Locate the [facility] provider you are looking for under [facility] and page to a number that you can be directly reached. If you still have difficulty reaching the provider, please page the [facility] (Director on Call) for the Hospitalists listed on amion for assistance.

## 2022-08-16 NOTE — Progress Notes (Signed)
I spoke with the patients spouse regarding SNF facilities. Pt and spouse request [facility]or [facility]

## 2022-08-16 NOTE — Inpatient Diabetes Management (Signed)
Inpatient Diabetes Program Recommendations  AACE/ADA: New Consensus Statement on Inpatient Glycemic Control  Target Ranges:  Prepandial:   less than 140 mg/dL      Peak postprandial:   less than 180 mg/dL (1-2 hours)      Critically ill patients:  140 - 180 mg/dL    Latest Reference Range & Units 08/16/22 00:22 08/16/22 06:22 08/16/22 12:02  Glucose-Capillary 70 - 99 mg/dL 181 (H) 128 (H) 183 (H)    Latest Reference Range & Units 08/15/22 06:15 08/15/22 12:19 08/15/22 18:06  Glucose-Capillary 70 - 99 mg/dL 81 156 (H) 150 (H)   Review of Glycemic Control  Diabetes history: DM2 Outpatient Diabetes medications: Metformin 1000 mg BID Current orders for Inpatient glycemic control: CBGs  Inpatient Diabetes Program Recommendations:    Insulin: Please consider ordering Novolog 0-9 units TID with meals and Novolog 0-5 units QHS while inpatient.  Thanks, Barnie Alderman, RN, MSN, [place] Diabetes Coordinator Inpatient Diabetes Program [phone] (Team Pager from 8am to [place])

## 2022-08-16 NOTE — TOC Initial Note (Signed)
Transition of Care [facility]) - Initial/Assessment Note    Patient Details  Name: Danny [name] MRN: [id number] Date of Birth: 06/24/1951  Transition of [facility]) CM/SW Contact:    Henrietta Dine, RN Phone Number: 08/16/2022, 3:30 PM  Clinical Narrative:  PT recommended SNF; pt/spouse agreed to fax out for SNF. Preferred [facility] or [facility]; await bed offers.          Expected Discharge Plan: Skilled Nursing Facility Barriers to Discharge: Continued Medical Work up   Patient Goals and CMS Choice Patient states their goals for this hospitalization and ongoing recovery are:: rehab CMS Medicare.gov Compare Post Acute Care list provided to:: Patient Choice offered to / list presented to : Patient  Expected Discharge Plan and Services Expected Discharge Plan: Welcome   Discharge Planning Services: CM Consult Post Acute Care Choice: [place] Living arrangements for the past 2 months: Single Family Home                                      Prior Living Arrangements/Services Living arrangements for the past 2 months: Single Family Home Lives with:: Spouse Patient language and need for interpreter reviewed:: Yes Do you feel safe going back to the place where you live?: Yes      Need for Family Participation in Patient Care: Yes (Comment) Care giver support system in place?: Yes (comment) Current home services: DME (W/C, RW) Criminal Activity/Legal Involvement Pertinent to Current Situation/Hospitalization: No - Comment as needed  Activities of Daily Living Home Assistive Devices/Equipment: Environmental consultant (specify type) ADL Screening (condition at time of admission) Patient's cognitive ability adequate to safely complete daily activities?: No Is the patient deaf or have difficulty hearing?: No Does the patient have difficulty seeing, even when wearing glasses/contacts?: No Does the patient have difficulty concentrating,  remembering, or making decisions?: Yes Patient able to express need for assistance with ADLs?: Yes Does the patient have difficulty dressing or bathing?: Yes Independently performs ADLs?: No Communication: Independent Dressing (OT): Needs assistance Is this a change from baseline?: Pre-admission baseline Grooming: Needs assistance Is this a change from baseline?: Pre-admission baseline Feeding: Needs assistance Is this a change from baseline?: Pre-admission baseline Bathing: Needs assistance Is this a change from baseline?: Pre-admission baseline Toileting: Needs assistance Is this a change from baseline?: Pre-admission baseline In/Out Bed: Needs assistance Is this a change from baseline?: Pre-admission baseline Walks in Home: Needs assistance Is this a change from baseline?: Pre-admission baseline Does the patient have difficulty walking or climbing stairs?: Yes Weakness of Legs: Both Weakness of Arms/Hands: Both  Permission Sought/Granted Permission sought to share information with : Case Manager Permission granted to share information with : Yes, Verbal Permission Granted  Share Information with NAME: Case Manager           Emotional Assessment Appearance:: Appears stated age Attitude/Demeanor/Rapport: Gracious Affect (typically observed): Accepting Orientation: : Oriented to Self, Oriented to Place, Oriented to  Time, Oriented to Situation Alcohol / Substance Use: Not Applicable Psych Involvement: No (comment)  Admission diagnosis:  Acute pancreatitis [K85.90] Hyperkalemia [E87.5] Acute pancreatitis, unspecified complication status, unspecified pancreatitis type [K85.90] Patient Active Problem List   Diagnosis Date Noted   Acute pancreatitis 08/12/2022   Possible pancreatic lesion 07/03/2022   Acute cholecystitis 07/01/2022   Hypotension 07/01/2022   Low back pain 03/29/2022   Hemarthrosis of right knee 06/30/2020   Heterotopic calcification, postoperative  06/30/2020   Total knee replacement status, right 03/22/2020   Hematospermia 11/18/2015   Epididymal cyst 05/25/2015   Parkinson disease ([place]) 01/14/2014   Obesity (BMI 30-39.9) 07/14/2013   Confusional arousals 02/13/2013   Disorder of ligament of right wrist 01/29/2012   Diabetes mellitus type 2, uncomplicated ([place]) 56/15/4884   Mixed hyperlipidemia 10/09/2011   Unilateral primary osteoarthritis, right knee 10/09/2011   Essential hypertension, benign 10/09/2011   PCP:  [name], PA Pharmacy:   [id number] [place], [place] [address]Phone: [phone]: [phone]    Social Determinants of Health (SDOH) Interventions    Readmission Risk Interventions     No data to display

## 2022-08-16 NOTE — Care Management Important Message (Signed)
Important Message  Patient Details IM Letter placed in Patients room for [place] Name: Danny [name] MRN: [id number] Date of Birth: October 28, 1951   Medicare Important Message Given:  Yes     [name] 08/16/2022, 3:17 PM

## 2022-08-16 NOTE — Progress Notes (Signed)
Physical Therapy Treatment Patient Details Name: Danny [name] MRN: [id number] DOB: 08/21/1951 Today's Date: 08/16/2022   History of Present Illness Danny [name] is a 71 y.o. male with PMH significant for DM2, HTN, HLD, Parkinson's disease, GERD, osteoarthritis  Patient presented to the ED from home on 8/27 with complaint of weakness, low blood pressure, abdominal pain and is unable to hold himself up.CT abdomen/pelvis with contrast showed increased edema/inflammation around the pancreatic head and body, with continuous inflammatory/edematous changes extending adjacent to the proximal duodenum, ascending colon and hepatic flexure, and into central mesentery suggesting worsening pancreatitis.    PT Comments    The  patient  is quite rigid in trunk and legs. Patient requires max/total assistance of 2 persons to sit up, stand and transfer to recliner then to and from [facility]. Patient's wife present, states that patient will need to be able to walk a few steps to return home. Patient is not at that level. Recommend SNF. Wife agrees.   Recommendations for follow up therapy are one component of a multi-disciplinary discharge planning process, led by the attending physician.  Recommendations may be updated based on patient status, additional functional criteria and insurance authorization.  Follow Up Recommendations  Skilled nursing-short term rehab (<3 hours/day) Can patient physically be transported by private vehicle: No   Assistance Recommended at Discharge Frequent or constant Supervision/Assistance  Patient can return home with the following A lot of help with walking and/or transfers;A lot of help with bathing/dressing/bathroom;Assistance with cooking/housework;Assist for transportation;Help with stairs or ramp for entrance   Equipment Recommendations       Recommendations for Other Services       Precautions / Restrictions Precautions Precautions: Fall Precaution Comments:  urgency for BM     Mobility  Bed Mobility Overal bed mobility: Needs Assistance Bed Mobility: Supine to Sit     Supine to sit: Max assist     General bed mobility comments: multimodal cues,patient moves legs to bed edge, max assist to sit upright, use of bed pad to scoot to bed edge, patient  leaning backwards    Transfers Overall transfer level: Needs assistance Equipment used: Rolling walker (2 wheels) Transfers: Sit to/from Stand, Bed to chair/wheelchair/BSC Sit to Stand: Max assist, +2 safety/equipment, +2 physical assistance           General transfer comment: assist to power up to stand from bed x 2, patient unable to shift feet to balance, sat back down onto bed. Strong  posterior lean. +2 max assist and faciltation to weight shift to step to recliner, patient barely turned  infront of recliner before"crashing " into chair, still holding RW. Patient then reports needing BSC, again +2 max/total to stand and pivot to and from [facility]. left in recliner for lunch    Ambulation/Gait                   Stairs             Wheelchair Mobility    Modified Rankin (Stroke Patients Only)       Balance Overall balance assessment: Needs assistance Sitting-balance support: Feet supported, Bilateral upper extremity supported Sitting balance-Leahy Scale: Zero     Standing balance support: Bilateral upper extremity supported, During functional activity, Reliant on assistive device for balance Standing balance-Leahy Scale: Zero Standing balance comment: posterior lean,  Cognition Arousal/Alertness: Awake/alert Behavior During Therapy: WFL for tasks assessed/performed Overall Cognitive Status: History of cognitive impairments - at baseline                                 General Comments: follows simple directions, disteracted if too much talking in room        Exercises General Exercises - Upper  Extremity Shoulder Flexion: AROM, Both, 10 reps Shoulder Extension: AROM, Both, 10 reps General Exercises - Lower Extremity Long Arc Quad: AROM, 10 reps, Both, Seated Hip Flexion/Marching: AAROM, Both, 10 reps, Seated    General Comments        Pertinent Vitals/Pain Pain Assessment Pain Assessment: Faces Faces Pain Scale: Hurts little more Pain Location: abdomen, " where the Dr. pushed on it." Pain Descriptors / Indicators: Discomfort Pain Intervention(s): Monitored during session    Home Living                          Prior Function            PT Goals (current goals can now be found in the care plan section) Progress towards PT goals: Not progressing toward goals - comment (very rigid, pooblanace, requires +2 total)    Frequency    Min 2X/week      PT Plan Discharge plan needs to be updated;Frequency needs to be updated    Co-evaluation              AM-PAC PT "6 Clicks" Mobility   Outcome Measure  Help needed turning from your back to your side while in a flat bed without using bedrails?: A Lot Help needed moving from lying on your back to sitting on the side of a flat bed without using bedrails?: Total Help needed moving to and from a bed to a chair (including a wheelchair)?: Total Help needed standing up from a chair using your arms (e.g., wheelchair or bedside chair)?: Total Help needed to walk in hospital room?: Total Help needed climbing 3-5 steps with a railing? : Total 6 Click Score: 7    End of Session Equipment Utilized During Treatment: Gait belt Activity Tolerance: Treatment limited secondary to medical complications (Comment) (very rigid- sinemet dose due in 30") Patient left: in chair;with call bell/phone within reach;with family/visitor present;with chair alarm set Nurse Communication: Mobility status PT Visit Diagnosis: Unsteadiness on feet (R26.81);Muscle weakness (generalized) (M62.81);Difficulty in walking, not elsewhere  classified (R26.2)     Time: 6712-4580 PT Time Calculation (min) (ACUTE ONLY): 45 min  Charges:  $Therapeutic Exercise: 8-22 mins $Therapeutic Activity: 8-22 mins $Self Care/Home Management: [place] Office [phone] Weekend [phone]    Claretha Cooper 08/16/2022, 1:18 PM

## 2022-08-16 NOTE — NC FL2 (Signed)
[place] LEVEL OF CARE SCREENING TOOL     IDENTIFICATION  Patient Name: Danny [name] Birthdate: [birth year] Sex: male Admission Date (Current Location): 08/12/2022  [facility] and [place] Number:  Herbalist and Address:  [facility],  [address], [place]      Provider Number: [id number]  Attending Physician Name and Address:  Darliss Cheney, MD  Relative Name and Phone Number:  Jerrik Housholder    Current Level of Care: Hospital Recommended Level of Care: [place] Prior Approval Number:    Date Approved/Denied:   PASRR Number: [id number]  Discharge Plan: SNF    Current Diagnoses: Patient Active Problem List   Diagnosis Date Noted   Acute pancreatitis 08/12/2022   Possible pancreatic lesion 07/03/2022   Acute cholecystitis 07/01/2022   Hypotension 07/01/2022   Low back pain 03/29/2022   Hemarthrosis of right knee 06/30/2020   Heterotopic calcification, postoperative 06/30/2020   Total knee replacement status, right 03/22/2020   Hematospermia 11/18/2015   Epididymal cyst 05/25/2015   Parkinson disease ([place]) 01/14/2014   Obesity (BMI 30-39.9) 07/14/2013   Confusional arousals 02/13/2013   Disorder of ligament of right wrist 01/29/2012   Diabetes mellitus type 2, uncomplicated ([place]) 17/51/0258   Mixed hyperlipidemia 10/09/2011   Unilateral primary osteoarthritis, right knee 10/09/2011   Essential hypertension, benign 10/09/2011    Orientation RESPIRATION BLADDER Height & Weight     Self, Time, Situation, Place  Normal Continent Weight: 80.4 kg Height:  '5\' 8"'$  (172.7 cm)  BEHAVIORAL SYMPTOMS/MOOD NEUROLOGICAL BOWEL NUTRITION STATUS      Continent Diet (heart healthy)  AMBULATORY STATUS COMMUNICATION OF NEEDS Skin   Extensive Assist Verbally Normal                       Personal Care Assistance Level of Assistance  Bathing, Feeding, Dressing Bathing Assistance: Limited assistance Feeding  assistance: Limited assistance Dressing Assistance: Maximum assistance     Functional Limitations Info  Sight, Hearing, Speech Sight Info: Adequate Hearing Info: Adequate Speech Info: Adequate    SPECIAL CARE FACTORS FREQUENCY  PT (By licensed PT), OT (By licensed OT)     PT Frequency: 5 x week OT Frequency: 5 x week            Contractures Contractures Info: Not present    Additional Factors Info  Insulin Sliding Scale, Code Status, Allergies Code Status Info: DNR Allergies Info: Codeine   Insulin Sliding Scale Info: SSI       Current Medications (08/16/2022):  This is the current hospital active medication list Current Facility-Administered Medications  Medication Dose Route Frequency Provider Last Rate Last Admin   acetaminophen (TYLENOL) tablet 650 mg  650 mg Oral Q6H PRN Reubin Milan, MD   650 mg at 08/14/22 2025   Or   acetaminophen (TYLENOL) suppository 650 mg  650 mg Rectal Q6H PRN Reubin Milan, MD       amLODipine (NORVASC) tablet 5 mg  5 mg Oral Daily Darliss Cheney, MD       carbidopa-levodopa (SINEMET CR) 50-200 MG per tablet controlled release 1 tablet  1 tablet Oral QHS Reubin Milan, MD   1 tablet at 08/15/22 2208   carbidopa-levodopa (SINEMET IR) 25-100 MG per tablet immediate release 1 tablet  1 tablet Oral 6 times per day Reubin Milan, MD   1 tablet at 08/16/22 1400   fludrocortisone (FLORINEF) tablet 0.1 mg  0.1 mg  Oral q AM Terrilee Croak, MD   0.1 mg at 08/16/22 0644   hydrALAZINE (APRESOLINE) injection 10 mg  10 mg Intravenous Q6H PRN Darliss Cheney, MD       ibuprofen (ADVIL) tablet 400 mg  400 mg Oral Q6H PRN Mansy, Jan A, MD   400 mg at 08/15/22 1800   insulin aspart (novoLOG) injection 0-5 Units  0-5 Units Subcutaneous QHS Pahwani, Ravi, MD       insulin aspart (novoLOG) injection 0-9 Units  0-9 Units Subcutaneous TID WC Pahwani, Einar Grad, MD       ondansetron (ZOFRAN) tablet 4 mg  4 mg Oral Q6H PRN Reubin Milan, MD        Or   ondansetron [facility]) injection 4 mg  4 mg Intravenous Q6H PRN Reubin Milan, MD       pantoprazole (PROTONIX) EC tablet 40 mg  40 mg Oral Daily Polly Cobia, RPH   40 mg at 08/16/22 1049   potassium chloride SA (KLOR-CON M) CR tablet 40 mEq  40 mEq Oral Once Darliss Cheney, MD         Discharge Medications: Please see discharge summary for a list of discharge medications.  Relevant Imaging Results:  Relevant Lab Results:   Additional Information    Henrietta Dine, RN

## 2022-08-17 ENCOUNTER — Inpatient Hospital Stay (HOSPITAL_COMMUNITY): Payer: Medicare Other

## 2022-08-17 DIAGNOSIS — K859 Acute pancreatitis without necrosis or infection, unspecified: Secondary | ICD-10-CM | POA: Diagnosis not present

## 2022-08-17 LAB — BASIC METABOLIC PANEL
Anion gap: 4 — ABNORMAL LOW (ref 5–15)
BUN: 12 mg/dL (ref 8–23)
CO2: 27 mmol/L (ref 22–32)
Calcium: 7.9 mg/dL — ABNORMAL LOW (ref 8.9–10.3)
Chloride: 102 mmol/L (ref 98–111)
Creatinine, Ser: 0.7 mg/dL (ref 0.61–1.24)
GFR, Estimated: >60 mL/min (ref >60)
Glucose, Bld: 168 mg/dL — ABNORMAL HIGH (ref 70–99)
Potassium: 3.1 mmol/L — ABNORMAL LOW (ref 3.5–5.1)
Sodium: 133 mmol/L — ABNORMAL LOW (ref 135–145)

## 2022-08-17 LAB — CBC WITH DIFFERENTIAL/PLATELET
Abs Immature Granulocytes: 0.09 10*3/uL — ABNORMAL HIGH (ref 0.00–0.07)
Basophils Absolute: 0.1 10*3/uL (ref 0.0–0.1)
Basophils Relative: 0 %
Eosinophils Absolute: 0.5 10*3/uL (ref 0.0–0.5)
Eosinophils Relative: 4 %
HCT: 35.7 % — ABNORMAL LOW (ref 39.0–52.0)
Hemoglobin: 12 g/dL — ABNORMAL LOW (ref 13.0–17.0)
Immature Granulocytes: 1 %
Lymphocytes Relative: 12 %
Lymphs Abs: 1.6 10*3/uL (ref 0.7–4.0)
MCH: 31.1 pg (ref 26.0–34.0)
MCHC: 33.6 g/dL (ref 30.0–36.0)
MCV: 92.5 fL (ref 80.0–100.0)
Monocytes Absolute: 1.1 10*3/uL — ABNORMAL HIGH (ref 0.1–1.0)
Monocytes Relative: 8 %
Neutro Abs: 10 10*3/uL — ABNORMAL HIGH (ref 1.7–7.7)
Neutrophils Relative %: 75 %
Platelets: 226 10*3/uL (ref 150–400)
RBC: 3.86 MIL/uL — ABNORMAL LOW (ref 4.22–5.81)
RDW: 14.6 % (ref 11.5–15.5)
WBC: 13.4 10*3/uL — ABNORMAL HIGH (ref 4.0–10.5)
nRBC: 0 % (ref 0.0–0.2)

## 2022-08-17 LAB — GLUCOSE, CAPILLARY
Glucose-Capillary: 165 mg/dL — ABNORMAL HIGH (ref 70–99)
Glucose-Capillary: 213 mg/dL — ABNORMAL HIGH (ref 70–99)
Glucose-Capillary: 118 mg/dL — ABNORMAL HIGH (ref 70–99)

## 2022-08-17 MED ORDER — POTASSIUM CHLORIDE CRYS ER 20 MEQ PO TBCR
40.0000 meq | EXTENDED_RELEASE_TABLET | ORAL | Status: AC
Start: 2022-08-17 — End: 2022-08-17
  Administered 2022-08-17 (×2): 40 meq via ORAL
  Filled 2022-08-17 (×2): qty 2

## 2022-08-17 MED ORDER — AMLODIPINE BESYLATE 10 MG PO TABS: 10.0000 mg | ORAL_TABLET | Freq: Every day | ORAL | 0 refills | Status: AC

## 2022-08-17 NOTE — Discharge Summary (Signed)
PatientPhysician Discharge Summary  TAM DELISLE XFG:[id number] DOB: 08-09-1951 DOA: 08/12/2022  PCP: [name], PA  Admit date: 08/12/2022 Discharge date: 08/17/2022 30 Day Unplanned Readmission Risk Score    Flowsheet Row ED to Hosp-Admission (Current) from 08/12/2022 in [facility]  30 Day Unplanned Readmission Risk Score (%) 13.21 Filed at 08/17/2022 1200       This score is the patient's risk of an unplanned readmission within 30 days of being discharged (0 -100%). The score is based on dignosis, age, lab data, medications, orders, and past utilization.   Low:  0-14.9   Medium: 15-21.9   High: 22-29.9   Extreme: 30 and above          Admitted From: Home Disposition: SNF  Recommendations for Outpatient Follow-up:  Follow up with PCP in 1-2 weeks Please obtain BMP/CBC in one week For follow-up with Dr. Barry Dienes or Dr. Zenia Resides in 1 to 2 weeks. Please follow up with your PCP on the following pending results: Unresulted Labs (From admission, onward)    None         Home Health: None Equipment/Devices: None  Discharge Condition: Stable CODE STATUS: DNR  Subjective: Seen and examined.  Wife at the bedside.  Complains of mild right upper quadrant pain.  Ultrasound abdomen was obtained which was unremarkable.  No other complaint.  Tolerating diet.  Brief/Interim Summary: NICKOLI BAGHERI is a 71 y.o. male with PMH significant for DM2, HTN, HLD, Parkinson's disease, GERD, osteoarthritis presented to the ED from home on 8/27 with complaint of weakness, low blood pressure, abdominal pain. Per EMS, his initial blood pressure was low at 70/40 and was given 500 mL of normal saline bolus.   Blood pressure improved to 135/90 by the time of presentation.  Afebrile, heart rate 95, breathing on room air. Labs with WC count 12.2, potassium 5.2, lipase more than 2200. Chest x-ray showed increased interstitial markings within the left lung base,  atelectasis versus infiltrate   CT abdomen/pelvis with contrast showed increased edema/inflammation around the pancreatic head and body, with continuous inflammatory/edematous changes extending adjacent to the proximal duodenum, ascending colon and hepatic flexure, and into central mesentery suggesting worsening pancreatitis.  There is fluid in the right pericolic gutter and pelvis possibly related to pancreatitis.  Bile leak from recent cholecystectomy is considered less likely.   Patient was admitted under hospitalist service.  Details below.  Acute pancreatitis: Presented with abdominal pain, weakness  Lipase level was over 2000.   CT scan abdomen finding as above showed worsening pancreatitis  Started on conservative management with IV fluid, IV analgesics, IV antiemetics, Protonix.  Pain resolved.  Tolerating regular diet.  Ultrasound abdomen negative.   Hypokalemia: Low again.  Patient will receive potassium replacement before discharge.   Hypotension: Initial blood pressure was 70/40 per EMS.  Improved with bolus fluid.  Blood pressure rising and has been high and for that reason we started him on amlodipine 5 mg p.o. daily but still blood pressure is high so he will be discharged on 10 mg p.o. daily.   Intermittent low-grade fever: Blood culture was sent.  Urinalysis, COVID PCR, chest x-ray unremarkable.  Last temperature of 101.3 at 10 PM on 08/14/2022.  Leukocytosis improving.    Type 2 diabetes mellitus A1c 6.4 on 08/05/2022, unchanged in 2 years PTA on metformin 1000 mg twice daily.     Mixed hyperlipidemia PTA on aspirin 81 mg daily, simvastatin 20 mg daily.  Parkinson disease Continue Sinemet 6 times a day.   GERD Currently on PPI   Suspicious pancreatic lesion 8/26, patient had MRI abdomen done after evaluation by surgeon Dr. Nedra Hai as an outpatient.  It showed a discrete hypoenhancing lesion in the mid body of the pancreas with upstream duct dilatation.  I  discussed with Dr. Ninfa Linden on 8/28.  Recommended CA 19-9 level which was ordered and was significantly elevated.  His office will arrange follow-up with Dr. Barry Dienes or Dr. Zenia Resides.   Generalized weakness: PT OT recommended SNF.  He is being discharged today in stable condition.  Discharge plan was discussed with patient and/or family member and they verbalized understanding and agreed with it.  Discharge Diagnoses:  Principal Problem:   Acute pancreatitis Active Problems:   Diabetes mellitus type 2, uncomplicated (HCC)   Mixed hyperlipidemia   Essential hypertension, benign   Parkinson disease ([place])    Discharge Instructions   Allergies as of 08/17/2022       Reactions   Codeine Nausea And Vomiting        Medication List     STOP taking these medications    traMADol 50 MG tablet Commonly known as: ULTRAM       TAKE these medications    amLODipine 10 MG tablet Commonly known as: NORVASC Take 1 tablet (10 mg total) by mouth daily.   aspirin EC 81 MG tablet Take 81 mg by mouth daily. Swallow whole.   carbidopa-levodopa 25-100 MG tablet Commonly known as: SINEMET IR TAKE 1 TABLET BY MOUTH 6 (SIX) TIMES DAILY. What changed:  when to take this additional instructions   carbidopa-levodopa 50-200 MG tablet Commonly known as: SINEMET CR TAKE 1 TABLET BY MOUTH EVERYDAY AT BEDTIME What changed: See the new instructions.   Cinnamon 500 MG capsule Take 500 mg by mouth daily.   famotidine 20 MG tablet Commonly known as: PEPCID Take 20 mg by mouth in the morning and at bedtime.   fludrocortisone 0.1 MG tablet Commonly known as: FLORINEF Take 1 tablet (0.1 mg total) by mouth daily. What changed: when to take this   Ginger (Zingiber officinalis) 550 MG Caps Take 550 mg by mouth daily.   GLUCOSAMINE CHONDR 1500 COMPLX PO Take 1,500 mg by mouth daily.   metFORMIN 1000 MG tablet Commonly known as: GLUCOPHAGE Take 1 tablet (1,000 mg total) by mouth 2 (two) times  daily with a meal.   multivitamin tablet Take 1 tablet by mouth daily with breakfast.   QUEtiapine 50 MG Tb24 24 hr tablet Commonly known as: SEROQUEL XR Take 50 mg by mouth at bedtime.   simvastatin 20 MG tablet Commonly known as: ZOCOR Take 20 mg by mouth every Monday, Wednesday, and Friday at 6 PM.   Vitamin C Chew Chew 1 tablet by mouth daily.        Follow-up Information     [name], PA Follow up in 1 week(s).   Specialty: Family Medicine Contact information: [address] [place] [address] [phone]                Allergies  Allergen Reactions   Codeine Nausea And Vomiting    Consultations: None   Procedures/Studies: US Abdomen Limited RUQ (LIVER/GB)  Result Date: 08/17/2022 CLINICAL DATA:  Abdominal pain EXAM: ULTRASOUND ABDOMEN LIMITED RIGHT UPPER QUADRANT COMPARISON:  August 12, 2022 CT evaluation. FINDINGS: Gallbladder: Gallbladder is surgically absent. Small amount of fluid seen in the gallbladder fossa following recent surgery  similar to recent CT. Common bile duct: Diameter: 6 mm Liver: Echogenic hepatic parenchyma suggests mild hepatic steatosis. The LEFT lobe of the liver difficult to evaluate given liver position and patient condition. Portal vein is patent on color Doppler imaging with normal direction of blood flow towards the liver. Other: None. IMPRESSION: Post cholecystectomy without gross biliary duct dilation. Trace fluid about the gallbladder fossa compatible with recent surgery. Overall limited assessment particularly in the midline due to patient condition and body habitus. Electronically Signed   By: Zetta Bills M.D.   On: 08/17/2022 12:01   MR ABDOMEN W WO CONTRAST  Result Date: 08/13/2022 CLINICAL DATA:  Pancreatic mass EXAM: MRI ABDOMEN WITHOUT AND WITH CONTRAST TECHNIQUE: Multiplanar multisequence MR imaging of the abdomen was performed both before and after the administration of intravenous contrast.  CONTRAST:  7.42m GADAVIST GADOBUTROL 1 MMOL/ML IV SOLN COMPARISON:  CT 08/12/2022, CT 07/01/2022 FINDINGS: Lower chest:  Lung bases are clear. Hepatobiliary: Patient status post cholecystectomy. Significant fluid collections in the gallbladder fossa. No enhancing hepatic lesion. No biliary duct dilatation. The common bile duct normal caliber. Pancreas: On precontrast T1 weighted imaging there is normal signal intensity in the head of the pancreas. There is loss of normal signal intensity in the mid body of the pancreas with bland hypo intensity measuring approximately 3.6 x 4.9 cm (image 38/series 4. On the postcontrast T1 weighted imaging there is discrete hypoenhancing lesion measuring 19 mm x 14 mm (39/series 18. Upstream of this lesion there is mild pancreatic duct dilatation (image 39/series 18. There is mild inflammatory stranding surrounding the mid body the pancreas as seen on comparison CT. No organized fluid collections. No vascular complication associated with acute pancreatitis. Spleen: Normal spleen. Adrenals/urinary tract: Adrenal glands and kidneys are normal. Stomach/Bowel: Stomach and limited of the small bowel is unremarkable Vascular/Lymphatic: Abdominal aortic normal caliber. No retroperitoneal periportal lymphadenopathy. Musculoskeletal: No aggressive osseous lesion IMPRESSION: 1. Findings suggest focal pancreatitis involving the mid pancreas as seen on comparison CTs. No organized fluid collections. 2. Discrete hypoenhancing lesion in the mid body the pancreas with upstream duct dilatation. While it is unusual for pancreatic adenocarcinoma to present as acute pancreatitis, this differential diagnosis warrants further evaluation. Recommend correlation with CA 19- 9 serum levels and if elevated consider endoscopic ultrasound for evaluation the mid body of the pancreas. If CA 19 -9 normal, recommend follow-up MRI with without contrast after acute pancreatitis subsided to re-evaluate hypoenhancing  lesion in the mid body the pancreas. 3. No MRI evidence complication following cholecystectomy. These results will be called to the ordering clinician or representative by the Radiologist Assistant, and communication documented in the PACS or [employer] Electronically Signed   By: SSuzy BouchardM.D.   On: 08/13/2022 10:38   MR 3D Recon At Scanner  Result Date: 08/13/2022 CLINICAL DATA:  Pancreatic mass EXAM: MRI ABDOMEN WITHOUT AND WITH CONTRAST TECHNIQUE: Multiplanar multisequence MR imaging of the abdomen was performed both before and after the administration of intravenous contrast. CONTRAST:  7.55mGADAVIST GADOBUTROL 1 MMOL/ML IV SOLN COMPARISON:  CT 08/12/2022, CT 07/01/2022 FINDINGS: Lower chest:  Lung bases are clear. Hepatobiliary: Patient status post cholecystectomy. Significant fluid collections in the gallbladder fossa. No enhancing hepatic lesion. No biliary duct dilatation. The common bile duct normal caliber. Pancreas: On precontrast T1 weighted imaging there is normal signal intensity in the head of the pancreas. There is loss of normal signal intensity in the mid body of the pancreas with bland hypo intensity measuring approximately  3.6 x 4.9 cm (image 38/series 4. On the postcontrast T1 weighted imaging there is discrete hypoenhancing lesion measuring 19 mm x 14 mm (39/series 18. Upstream of this lesion there is mild pancreatic duct dilatation (image 39/series 18. There is mild inflammatory stranding surrounding the mid body the pancreas as seen on comparison CT. No organized fluid collections. No vascular complication associated with acute pancreatitis. Spleen: Normal spleen. Adrenals/urinary tract: Adrenal glands and kidneys are normal. Stomach/Bowel: Stomach and limited of the small bowel is unremarkable Vascular/Lymphatic: Abdominal aortic normal caliber. No retroperitoneal periportal lymphadenopathy. Musculoskeletal: No aggressive osseous lesion IMPRESSION: 1. Findings suggest  focal pancreatitis involving the mid pancreas as seen on comparison CTs. No organized fluid collections. 2. Discrete hypoenhancing lesion in the mid body the pancreas with upstream duct dilatation. While it is unusual for pancreatic adenocarcinoma to present as acute pancreatitis, this differential diagnosis warrants further evaluation. Recommend correlation with CA 19- 9 serum levels and if elevated consider endoscopic ultrasound for evaluation the mid body of the pancreas. If CA 19 -9 normal, recommend follow-up MRI with without contrast after acute pancreatitis subsided to re-evaluate hypoenhancing lesion in the mid body the pancreas. 3. No MRI evidence complication following cholecystectomy. These results will be called to the ordering clinician or representative by the Radiologist Assistant, and communication documented in the PACS or [employer]. Electronically Signed   By: Suzy Bouchard M.D.   On: 08/13/2022 10:38   DG CHEST PORT 1 VIEW  Result Date: 08/13/2022 CLINICAL DATA:  Fevers EXAM: PORTABLE CHEST 1 VIEW COMPARISON:  08/12/2022 FINDINGS: Cardiac shadow is stable. Stable left basilar atelectasis is noted. The lungs are hypoinflated. No effusion or pneumothorax is seen. No bony abnormality is noted. IMPRESSION: Mild left basilar atelectasis. Electronically Signed   By: Inez Catalina M.D.   On: 08/13/2022 02:53   CT ABDOMEN PELVIS W CONTRAST  Result Date: 08/12/2022 CLINICAL DATA:  Abdominal pain, acute, nonlocalized EXAM: CT ABDOMEN AND PELVIS WITH CONTRAST TECHNIQUE: Multidetector CT imaging of the abdomen and pelvis was performed using the standard protocol following bolus administration of intravenous contrast. RADIATION DOSE REDUCTION: This exam was performed according to the departmental dose-optimization program which includes automated exposure control, adjustment of the mA and/or kV according to patient size and/or use of iterative reconstruction technique. CONTRAST:  141m  OMNIPAQUE IOHEXOL 300 MG/ML  SOLN COMPARISON:  07/01/2022 FINDINGS: Lower chest: No pleural or pericardial effusion. Coronary calcifications. Coarse interstitial opacities in the dependent aspect of both lower lobes as before. Hepatobiliary: Interval cholecystectomy. No focal liver lesion or biliary ductal dilatation. Pancreas: No discrete mass or ductal dilatation. Inflammatory changes around the head and body, slightly increased since previous. Spleen: Normal in size without focal abnormality. Adrenals/Urinary Tract: No adrenal mass. Simple appearing cysts in the upper pole left kidney, requiring no imaging follow-up. No worrisome renal mass or hydronephrosis. Ureters decompressed. Urinary bladder incompletely distended. Stomach/Bowel: Stomach is partially distended, unremarkable. The small bowel is decompressed, unremarkable. Appendix not identified. The colon is incompletely distended, unremarkable. Vascular/Lymphatic: Moderate calcified aortoiliac plaque without aneurysm or evident stenosis. No abdominal or pelvic adenopathy. Portal vein patent. Reproductive: Mild prostate enlargement Other: Bilateral pelvic phleboliths. Trace ascites in the pelvis and right pericolic gutter. Inflammatory/edematous changes around the pancreatic head and body, adjacent to the proximal duodenum, ascending colon and hepatic flexure, extending centrally into the mesentery below the pancreas. No free air. Musculoskeletal: Facet DJD L4-5 allows early grade 1 anterolisthesis. Anterior vertebral endplate spurring at multiple levels in the lower thoracic  spine. No acute findings. IMPRESSION: 1. Increase edema/inflammation around the pancreatic head and body, with contiguous inflammatory/edematous changes extending adjacent to the proximal duodenum, ascending colon and hepatic flexure, and into central mesentery suggesting worsening pancreatitis. 2. Fluid in the right pericolic gutter and pelvis, possibly related to pancreatitis. Bile  leak from recent cholecystectomy is considered less likely possibility. 3. Coronary and Aortic Atherosclerosis (ICD10-170.0). Electronically Signed   By: Lucrezia Europe M.D.   On: 08/12/2022 11:13   DG Chest Portable 1 View  Result Date: 08/12/2022 CLINICAL DATA:  Weakness EXAM: PORTABLE CHEST 1 VIEW COMPARISON:  07/01/2022 FINDINGS: Stable cardiomediastinal contours. Increased interstitial markings within the left lung base. No pleural effusion or pneumothorax. IMPRESSION: Increased interstitial markings within the left lung base, atelectasis versus infiltrate. Electronically Signed   By: Davina Poke D.O.   On: 08/12/2022 10:00     Discharge Exam: Vitals:   08/17/22 0523 08/17/22 1023  BP: (!) 158/78 (!) 157/88  Pulse: 92 83  Resp: 18 20  Temp: 99.7 F (37.6 C) 98.7 F (37.1 C)  SpO2: 96% 97%   Vitals:   08/16/22 2000 08/16/22 2054 08/17/22 0523 08/17/22 1023  BP:  (!) 148/84 (!) 158/78 (!) 157/88  Pulse:  88 92 83  Resp: '20 18 18 20  '$ Temp:  99.9 F (37.7 C) 99.7 F (37.6 C) 98.7 F (37.1 C)  TempSrc:  Oral Oral Oral  SpO2:  97% 96% 97%  Weight:      Height:        General: Pt is alert, awake, not in acute distress Cardiovascular: RRR, S1/S2 +, no rubs, no gallops Respiratory: CTA bilaterally, no wheezing, no rhonchi Abdominal: Soft, NT, ND, bowel sounds + Extremities: no edema, no cyanosis    The results of significant diagnostics from this hospitalization (including imaging, microbiology, ancillary and laboratory) are listed below for reference.     Microbiology: Recent Results (from the past 240 hour(s))  SARS Coronavirus 2 by RT PCR (hospital order, performed in [facility] hospital lab) *cepheid single result test* Anterior Nasal Swab     Status: None   Collection Time: 08/13/22 12:53 AM   Specimen: Anterior Nasal Swab  Result Value Ref Range Status   SARS Coronavirus 2 by RT PCR NEGATIVE NEGATIVE Final    Comment: (NOTE) SARS-CoV-2 target nucleic acids are  NOT DETECTED.  The SARS-CoV-2 RNA is generally detectable in upper and lower respiratory specimens during the acute phase of infection. The lowest concentration of SARS-CoV-2 viral copies this assay can detect is 250 copies / mL. A negative result does not preclude SARS-CoV-2 infection and should not be used as the sole basis for treatment or other patient management decisions.  A negative result may occur with improper specimen collection / handling, submission of specimen other than nasopharyngeal swab, presence of viral mutation(s) within the areas targeted by this assay, and inadequate number of viral copies (<250 copies / mL). A negative result must be combined with clinical observations, patient history, and epidemiological information.  Fact Sheet for Patients:   https://www.patel.info/  Fact Sheet for Healthcare Providers: https://hall.com/  This test is not yet approved or  cleared by the [place] FDA and has been authorized for detection and/or diagnosis of SARS-CoV-2 by FDA under an Emergency Use Authorization (EUA).  This EUA will remain in effect (meaning this test can be used) for the duration of the COVID-19 declaration under Section 564(b)(1) of the Act, 21 U.S.C. section 360bbb-3(b)(1), unless the authorization is terminated  or revoked sooner.  Performed at [facility], [place] [address], [place], [place] [address]   Culture, blood (Routine X 2) w Reflex to ID Panel     Status: None (Preliminary result)   Collection Time: 08/13/22  4:35 AM   Specimen: BLOOD  Result Value Ref Range Status   Specimen Description   Final    BLOOD BLOOD LEFT ARM Performed at [facility] [address] [place], [place] [address]    Special Requests   Final    BOTTLES DRAWN AEROBIC AND ANAEROBIC Blood Culture adequate volume Performed at [facility] [address],  [place], [place] [address]    Culture   Final    NO GROWTH 4 DAYS Performed at [facility], [place] [address], [place], [place] [address]    Report Status PENDING  Incomplete  Culture, blood (Routine X 2) w Reflex to ID Panel     Status: None (Preliminary result)   Collection Time: 08/13/22  4:42 AM   Specimen: BLOOD  Result Value Ref Range Status   Specimen Description   Final    BLOOD BLOOD RIGHT HAND Performed at [facility] [address], [place], [place] [address]    Special Requests   Final    BOTTLES DRAWN AEROBIC ONLY Blood Culture adequate volume Performed at [facility] [address] [place],  [address]    Culture   Final    NO GROWTH 4 DAYS Performed at [facility], [place] [address], [place],  [address]    Report Status PENDING  Incomplete     Labs: BNP (last 3 results) No results for input(s): "BNP" in the last 8760 hours. Basic Metabolic Panel: Recent Labs  Lab 08/13/22 0435 08/14/22 0534 08/15/22 0449 08/16/22 1000 08/17/22 0826  NA 142 141 138 138 133*  K 3.7 3.7 2.9* 3.3* 3.1*  CL 113* 112* 113* 108 102  CO2 '23 23 22 22 27  '$ GLUCOSE 108* 109* 102* 199* 168*  BUN '17 16 12 11 12  '$ CREATININE 0.80 0.74 0.65 0.71 0.70  CALCIUM 8.4* 8.3* 7.7* 8.2* 7.9*   Liver Function Tests: Recent Labs  Lab 08/12/22 0912 08/13/22 0435 08/14/22 0534 08/15/22 0449  AST 16 13* 14* 15  ALT 6 6 <5 6  ALKPHOS 65 55 61 59  BILITOT 1.3* 1.5* 1.8* 1.7*  PROT 6.6 5.5* 5.6* 4.8*  ALBUMIN 3.7 3.0* 2.8* 2.2*   Recent Labs  Lab 08/12/22 0912 08/13/22 0435 08/14/22 0534 08/15/22 0449  LIPASE 2,243* 623* 94* 42   No results for input(s): "AMMONIA" in the last 168 hours. CBC: Recent Labs  Lab 08/12/22 0912 08/13/22 0435 08/14/22 0534 08/15/22 0449 08/16/22 1000 08/17/22 0826  WBC 12.2* 17.9* 19.7* 15.9* 13.4* 13.4*  NEUTROABS 10.6*  --   --   --  10.4* 10.0*  HGB 14.3 13.0 12.9* 11.5* 12.0* 12.0*   HCT 43.3 39.3 38.6* 33.8* 35.1* 35.7*  MCV 96.4 96.6 95.1 93.6 92.1 92.5  PLT 187 169 167 170 199 226   Cardiac Enzymes: No results for input(s): "CKTOTAL", "CKMB", "CKMBINDEX", "TROPONINI" in the last 168 hours. BNP: Invalid input(s): "POCBNP" CBG: Recent Labs  Lab 08/16/22 1202 08/16/22 1643 08/16/22 2115 08/17/22 0718 08/17/22 1111  GLUCAP 183* 162* 164* 165* 213*   D-Dimer No results for input(s): "DDIMER" in the last 72 hours. Hgb A1c No results for input(s): "HGBA1C" in the last 72 hours. Lipid  Profile Recent Labs    08/15/22 0449  CHOL 109  HDL 33*  LDLCALC 60  TRIG 80  CHOLHDL 3.3   Thyroid function studies No results for input(s): "TSH", "T4TOTAL", "T3FREE", "THYROIDAB" in the last 72 hours.  Invalid input(s): "FREET3" Anemia work up No results for input(s): "VITAMINB12", "FOLATE", "FERRITIN", "TIBC", "IRON", "RETICCTPCT" in the last 72 hours. Urinalysis    Component Value Date/Time   COLORURINE YELLOW 08/13/2022 0226   APPEARANCEUR HAZY (A) 08/13/2022 0226   LABSPEC 1.042 (H) 08/13/2022 0226   PHURINE 5.0 08/13/2022 0226   GLUCOSEU NEGATIVE 08/13/2022 0226   HGBUR NEGATIVE 08/13/2022 0226   BILIRUBINUR NEGATIVE 08/13/2022 0226   BILIRUBINUR small (A) 07/16/2017 0946   BILIRUBINUR Small 05/17/2015 1212   KETONESUR 20 (A) 08/13/2022 0226   PROTEINUR 30 (A) 08/13/2022 0226   UROBILINOGEN 0.2 07/16/2017 0946   UROBILINOGEN 1.0 08/14/2010 0106   NITRITE NEGATIVE 08/13/2022 0226   LEUKOCYTESUR NEGATIVE 08/13/2022 0226   Sepsis Labs Recent Labs  Lab 08/14/22 0534 08/15/22 0449 08/16/22 1000 08/17/22 0826  WBC 19.7* 15.9* 13.4* 13.4*   Microbiology Recent Results (from the past 240 hour(s))  SARS Coronavirus 2 by RT PCR (hospital order, performed in [facility] hospital lab) *cepheid single result test* Anterior Nasal Swab     Status: None   Collection Time: 08/13/22 12:53 AM   Specimen: Anterior Nasal Swab  Result Value Ref Range Status    SARS Coronavirus 2 by RT PCR NEGATIVE NEGATIVE Final    Comment: (NOTE) SARS-CoV-2 target nucleic acids are NOT DETECTED.  The SARS-CoV-2 RNA is generally detectable in upper and lower respiratory specimens during the acute phase of infection. The lowest concentration of SARS-CoV-2 viral copies this assay can detect is 250 copies / mL. A negative result does not preclude SARS-CoV-2 infection and should not be used as the sole basis for treatment or other patient management decisions.  A negative result may occur with improper specimen collection / handling, submission of specimen other than nasopharyngeal swab, presence of viral mutation(s) within the areas targeted by this assay, and inadequate number of viral copies (<250 copies / mL). A negative result must be combined with clinical observations, patient history, and epidemiological information.  Fact Sheet for Patients:   https://www.patel.info/  Fact Sheet for Healthcare Providers: https://hall.com/  This test is not yet approved or  cleared by the [place] FDA and has been authorized for detection and/or diagnosis of SARS-CoV-2 by FDA under an Emergency Use Authorization (EUA).  This EUA will remain in effect (meaning this test can be used) for the duration of the COVID-19 declaration under Section 564(b)(1) of the Act, 21 U.S.C. section 360bbb-3(b)(1), unless the authorization is terminated or revoked sooner.  Performed at [facility], [place] [address], [place], [place] [address]   Culture, blood (Routine X 2) w Reflex to ID Panel     Status: None (Preliminary result)   Collection Time: 08/13/22  4:35 AM   Specimen: BLOOD  Result Value Ref Range Status   Specimen Description   Final    BLOOD BLOOD LEFT ARM Performed at [place] [address], [place], [place] [address]    Special Requests   Final    BOTTLES DRAWN AEROBIC AND  ANAEROBIC Blood Culture adequate volume Performed at [place] [address], [place], [place] [address]    Culture   Final    NO GROWTH 4 DAYS Performed at [facility],  [address], [place], [place] [address]    Report Status PENDING  Incomplete  Culture, blood (Routine X 2) w Reflex to ID Panel     Status: None (Preliminary result)   Collection Time: 08/13/22  4:42 AM   Specimen: BLOOD  Result Value Ref Range Status   Specimen Description   Final    BLOOD BLOOD RIGHT HAND Performed at [place] [address] [place], [place] [address]    Special Requests   Final    BOTTLES DRAWN AEROBIC ONLY Blood Culture adequate volume Performed at [place] [address] [place], [place] [address]    Culture   Final    NO GROWTH 4 DAYS Performed at [facility], [place] [address], [place], [place] [address]    Report Status PENDING  Incomplete     Time coordinating discharge: Over 30 minutes  SIGNED:   Darliss Cheney, MD  [facility] 08/17/2022, 12:33 PM *Please note that this is a verbal dictation therefore any spelling or grammatical errors are due to the "[place] One" system interpretation. If 7PM-7AM, please contact night-coverage [email or web address]

## 2022-08-17 NOTE — TOC Transition Note (Addendum)
Transition of Care [facility]) - CM/SW Discharge Note   Patient Details  Name: Danny [name] MRN: [id number] Date of Birth: [birth year]  Transition of Care [facility]) CM/SW Contact:  Dessa Phi, RN Phone Number: 08/17/2022, 12:16 PM   Clinical Narrative: Going to [facility] d/c summary prior PTAR.   -1:43p-PTAR called going to rm#[id number] [place],report tel#[phone]. No further CM needs.    Final next level of care: Skilled Nursing Facility Barriers to Discharge: No Barriers Identified   Patient Goals and CMS Choice Patient states their goals for this hospitalization and ongoing recovery are:: rehab CMS Medicare.gov Compare Post Acute Care list provided to:: Patient Choice offered to / list presented to : Patient  Discharge Placement              Patient [facility]: [facility] Patient to be transferred to facility by:  Corey Harold) Name of family member notified:  (Judy spouse) Patient and family notified of of transfer: 08/17/22  Discharge Plan and Services   Discharge Planning Services: CM Consult Post Acute Care Choice: [place]                               Social Determinants of Health (SDOH) Interventions     Readmission Risk Interventions     No data to display

## 2022-08-17 NOTE — TOC Progression Note (Signed)
Transition of Care [facility]) - Progression Note    Patient Details  Name: Danny [name] MRN: [id number] Date of Birth: May 11, 1951  Transition of Care [facility]) CM/SW Contact  Modesty Rudy, Juliann Pulse, RN Phone Number: 08/17/2022, 10:41 AM  Clinical Narrative: Provided w/ bed offers.Await choice.   1. 1.5 mi [facility] Rehab [place], [place] [address] [phone] Overall rating Much below average 2. 1.9 mi [facility] [place] [address] [place], [place] [address] [phone] Overall rating Above average 3. 2 mi [facility] [address] [place], [place] [address] [phone] Overall rating Much below average 4. 2.4 mi [facility] at the [place], [place] [address] [phone] Overall rating Above average 5. 2.6 mi [facility] [place], [place] [address] [phone] Overall rating Much below average 6. 3.4 mi [place], [place] [address] [phone] Overall rating Much below average 7. 3.7 mi [facility] [place], [place] [address] [phone] Overall rating Much above average 8. 3.9 mi [facility] [place], [place] [address] [phone] Overall rating Above average 9. [place] [facility] [address] [place], [place] [address] [phone] Overall rating Average 10. 4.2 mi [place] [address] [place], [place] [address] [phone] Overall rating Much below average 11. 4.6 mi [facility] [address], [place] [address] [phone] Overall rating Much below average 12. 6.3 mi [facility] [address] [place], [place] [address] [phone] Overall rating Above average 13. 9.2 mi [facility] [place], [place] [address] [phone] Overall rating Above average 14. 9.6 [place], [place] [address] [phone] Overall rating Much above average 15. 9.8 mi [facility] [address], [place] [address] [phone] Overall rating Above average 16. 9.9 mi [facility] [address] [place], [place] [address] [phone] Overall rating Much above average 17. 10.7 mi [place] [facility] [address] [place], [place] [address] [phone] Overall rating Much above average 18. 13.3 [facility] [address] [place], [place] [address] [phone] Overall rating Much below average 19. 13.6 mi [facility] [address] [place], [place] [address] [phone] Overall rating Much below average 20. 14.2 mi [place] and [facility] [place], [place] [address] [phone] Overall rating Much above average 21. 14.4 [place] [address] [place], [place] [address] [phone] Overall rating Much below average 22. 15 mi [facility] at [place], [place] [address] [phone] Overall rating Much above average 23. 15.2 mi [facility] [address] [place], [place] [address] [phone] Overall rating Average 24. 15.3 mi The [place] [address] [place], [place] [address] [phone] Overall rating Average 25. 16.9 mi [facility] [place], [place] [address] [phone] Overall rating Above average 26. 18.1 [place],  [address] [phone] Overall rating Above average  83. 18.9 [facility] [address] [place], [place] [address] [phone] Overall rating Much below average 28. 19.7 mi [facility] [place], [place] [address] [phone] Overall rating Below average 29. 20.2 mi [address] at [facility] at [place], [place] [address] [phone] Overall rating Much above average 30. 20.4 mi [facility] [address] [place], [place] [address] [phone] Overall rating Much below average 31. 20.4 mi [facility] for [facility] and Rehabilitation [address] [place], [place] [address] [phone] Overall rating Much below average 32. 20.6 [place], [place] [address] [phone] Overall rating Much above average 33. 21.2 [address] [place], [place] [address] [phone] Overall rating Below average 34. 22.7 [place] [address] [place], [place] [address] [phone] Overall rating Below average 35. 22.8 mi [facility] [address] [place], [place] [address] [phone] Overall rating Much above average 36. 23.1 mi [place] [address] [place], [place] [address] [phone] Overall rating Average 37. 23.5 mi [facility] [address] [place], [place] [address] [phone] Overall rating Above average 38. 23.7 [place], [place] [address] [phone] Overall rating Not available18 39. [place], [place] [address] [phone] Overall rating Much below average 40. 24.8 mi [place] [address] [place], [place] [address] [phone] Overall rating Much above average To explore and download nursing home data,visit the data catalog on CMS.gov Consumer alert Nursing homes that have been cited for potential issues related to abuse have the following icon next to their name:   More information about this, and what to do if you suspect abuse has occurred can be found here  About Medicare Nondiscrimination/AccessibilityPrivacy PolicyPrivacy SettingLinking PolicyUsing this [facility] website managed and paid for by the [place] [employer] for [employer] and [employer]. [place], [place] [address] Medicare.gov    Expected Discharge Plan: [place] Barriers to Discharge: Continued Medical Work up  Expected Discharge Plan and Services Expected Discharge Plan: [place]   Discharge Planning Services: CM Consult Post Acute Care Choice: [place] Living arrangements for the past 2 months: Single Family Home                                       Social Determinants of Health (SDOH) Interventions    Readmission Risk Interventions     No data to display

## 2022-08-17 NOTE — Progress Notes (Addendum)
he patient is complaining of sharp upper right quadrant pain 8/10 worsened with coughing or deep breathing. He denies n/v, diarrhea or constipation.Provider updated.

## 2022-08-17 NOTE — Progress Notes (Signed)
PTAR has arrived. No change from am assessment.

## 2022-08-17 NOTE — Progress Notes (Signed)
Report called to Maudie Mercury at [place]. She denied questions or concerns at this time. Awaiting PTAR arrival. Pt remains stable, no changes from am assessment.

## 2022-08-18 LAB — CULTURE, BLOOD (ROUTINE X 2)
Culture: NO GROWTH
Culture: NO GROWTH
Special Requests: ADEQUATE
Special Requests: ADEQUATE

## 2022-08-27 ENCOUNTER — Telehealth: Payer: Self-pay

## 2022-08-27 NOTE — Telephone Encounter (Signed)
-----   Message from Irving Copas., MD sent at 08/27/2022  4:34 PM EDT ----- Regarding: RE: EUS SA, Thanks for reaching out. I agree with the CA 19-9 this elevated without overt bili obstruction, and the findings on MRI we need to try to exclude malignancy. Zubayr Bednarczyk, Please schedule this patient for EUS with me on 9/28 should still have a time available before the afternoon procedures begin. Thanks. GM  FYI CG about your patient. ----- Message ----- From: Dwan Bolt, MD Sent: 08/27/2022   2:49 PM EDT To: Irving Copas., MD Subject: EUS                                            Gabe, I am referring this patient to you and wanted to give you a heads up. He had a cholecystectomy in July, and on intraop cholangiogram his PD filled and looked abnormal. He got readmitted in late August with acute pancreatitis, and he has a mass-like lesion on both CT and MRI. It is difficult to tell if this is all just pancreatitis or if there is an underlying malignancy, but his CA19-9 was 500 so I am really suspicious there is a cancer. Could you get him in for an EUS? He has previously been a patient at [facility] for colonoscopies. Thanks, [place]

## 2022-08-28 ENCOUNTER — Other Ambulatory Visit: Payer: Self-pay

## 2022-08-28 DIAGNOSIS — K859 Acute pancreatitis without necrosis or infection, unspecified: Secondary | ICD-10-CM

## 2022-08-28 DIAGNOSIS — K869 Disease of pancreas, unspecified: Secondary | ICD-10-CM

## 2022-08-28 NOTE — Telephone Encounter (Signed)
The pt has been scheduled for EUS on 09/13/22 at 1130 am at [facility] with GM.    EUS scheduled, pt wife instructed and medications reviewed.  Patient instructions mailed to home.  Patient to call with any questions or concerns.

## 2022-09-05 ENCOUNTER — Other Ambulatory Visit: Payer: Medicare Other | Admitting: Student

## 2022-09-05 ENCOUNTER — Non-Acute Institutional Stay: Payer: Medicare Other | Admitting: Internal Medicine

## 2022-09-05 VITALS — BP 129/90 | HR 89 | Temp 96.9°F | Resp 16 | Ht 70.0 in | Wt 161.0 lb

## 2022-09-05 DIAGNOSIS — G20A1 Parkinson's disease without dyskinesia, without mention of fluctuations: Secondary | ICD-10-CM

## 2022-09-05 DIAGNOSIS — R531 Weakness: Secondary | ICD-10-CM

## 2022-09-05 DIAGNOSIS — K8689 Other specified diseases of pancreas: Secondary | ICD-10-CM

## 2022-09-05 DIAGNOSIS — R634 Abnormal weight loss: Secondary | ICD-10-CM

## 2022-09-06 ENCOUNTER — Encounter (HOSPITAL_COMMUNITY): Payer: Self-pay | Admitting: Gastroenterology

## 2022-09-06 NOTE — Progress Notes (Signed)
Attempted to obtain medical history via telephone, unable to reach at this time. HIPAA compliant voicemail message left requesting return call to pre surgical testing department. 

## 2022-09-06 NOTE — Progress Notes (Signed)
[facility] Palliative Care Consult Note Telephone: [phone]  Fax: [phone]   Date of encounter: 09/12/22 12:44 PM PATIENT NAME: Danny [name] [address] [place] [address]   [phone] (home)  DOB: 24-Mar-1951 MRN: [id number] PRIMARY CARE PROVIDER:    [name], [place],  [address] [place] [address] [phone]  REFERRING PROVIDER:   Earmon Phoenix, NP at [facility]:    Contact Information     Name Relation Home Work [place] Spouse [phone]        I met face to face with patient and family in [facility].  Palliative Care was asked to follow this patient by consultation request of  Earmon Phoenix, NP to address advance care planning and complex medical decision making. This is the initial visit.                                     ASSESSMENT AND PLAN / RECOMMENDATIONS:   Advance Care Planning/Goals of Care: Goals include to maximize quality of life and symptom management. Patient/health care surrogate gave his/her permission to discuss.Our advance care planning conversation included a discussion about:    The value and importance of advance care planning  Experiences with loved ones who have been seriously ill or have died  Exploration of personal, cultural or spiritual beliefs that might influence medical decisions  Exploration of goals of care in the event of a sudden injury or illness  Identification  of a healthcare agent--wife [name] Review and updating or creation of an  advance directive document . Decision not to resuscitate or to de-escalate disease focused treatments due to poor prognosis. CODE STATUS:  DNR, MOST on file  Symptom Management/Plan:    Follow up Palliative Care Visit: Palliative care will continue to follow for complex medical decision making, advance care planning, and clarification of goals. Return after  endoscopy with biopsy to discuss next steps for management.   This visit was coded based on medical decision making (MDM).   PPS: 40%  HOSPICE ELIGIBILITY/DIAGNOSIS: Pancreatic mass/yes if malignant  Chief Complaint: palliative care follow-up in SNF--first SNF visit  HISTORY OF PRESENT ILLNESS:  Danny [name] is a 71 y.o. year old male  with Parkinson's disease, DMII, htn, hl, gerd, OA who was followed by our NP at home prior to hospitalization 08/12/22 with acute pancreatitis, hypokalemia, hypotension, intermittent low grade fever, and suspicious pancreatic lesion on MRI abdomen 08/11/22 with elevated CA 19-9 of 587. He was treated for the pancreatitis and then discharged here for rehab with PT, OT.  He saw Dr. Zenia Resides in f/u on 08/27/22 for the pancreatic mass and there are plans for endoscopic [place] with biopsy 09/13/22.  The mass was incidentally found on the imaging after his lap chole.MRCP also confirmed a mass-like lesion in the body of the pancreas at the juncture with the neck but not involving his blood vessels.  There was stranding along the root of the mesentery as well as biliary ductal dilation.  There were no obvious mets on imaging.  Dr. Zenia Resides noted that she'd recommend chemo presurgery if his performance status permits and biopsy confirms malignancy.  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  He also started a record company.  He was able to ambulate with a walker at home, but did struggle to get out of bed.  His wife helped him for several years with dressing due to fine motor skills challenges related to his PD.  He was eating ok at home.  Off and on here depending on food selections.  His weight has dropped to 160 lbs from 193 lbs previously.  He feels tired all of the time.  He lives with his wife, her daughter and  granddaughter but they work or go to school so his wife is the main caregiver for him.  He notes he's had clear to white BMs with a couple of normal brown ones.  He knows that's not good.  He says he would not want to be tube fed if he was not expected to recover.    History obtained from review of EMR, discussion with primary team, and interview with family, facility staff/caregiver and/or Danny [name].   I reviewed available labs, medications, imaging, studies and related documents from the EMR.  Records reviewed and summarized above.   ROS  Review of Systemssee hpi  Physical Exam: Vitals:   09/12/22 1243  BP: (!) 129/90  Pulse: 89  Resp: 16  Temp: (!) 96.9 F (36.1 C)  SpO2: 96%  Weight: 161 lb (73 kg)  Height: '5\' 10"'  (1.778 m)   Body mass index is 23.1 kg/m. Wt Readings from Last 500 Encounters:  09/12/22 161 lb (73 kg)  08/12/22 177 lb 4 oz (80.4 kg)  07/02/22 168 lb (76.2 kg)  04/17/22 176 lb 3.2 oz (79.9 kg)  01/09/22 179 lb (81.2 kg)  11/08/21 191 lb 5.8 oz (86.8 kg)  11/01/21 191 lb 6.4 oz (86.8 kg)  05/25/21 187 lb (84.8 kg)  01/11/21 193 lb 2 oz (87.6 kg)  08/11/20 208 lb (94.3 kg)  07/06/20 208 lb 5 oz (94.5 kg)  06/30/20 212 lb (96.2 kg)  05/23/20 212 lb (96.2 kg)  05/10/20 225 lb (102.1 kg)  04/05/20 225 lb (102.1 kg)  03/22/20 225 lb (102.1 kg)  03/08/20 225 lb 1.4 oz (102.1 kg)  03/01/20 225 lb 3 oz (102.1 kg)  02/11/20 223 lb (101.2 kg)  01/06/20 218 lb (98.9 kg)  10/05/19 225 lb (102.1 kg)  08/27/19 224 lb (101.6 kg)  08/17/19 224 lb 6.4 oz (101.8 kg)  06/04/19 223 lb (101.2 kg)  12/03/18 224 lb (101.6 kg)  06/05/18 223 lb (101.2 kg)  05/05/18 218 lb (98.9 kg)  04/24/18 220 lb (99.8 kg)  04/16/18 222 lb (100.7 kg)  01/17/18 226 lb (102.5 kg)  01/17/18 226 lb (102.5 kg)  12/05/17 226 lb (102.5 kg)  10/15/17 223 lb 6.4 oz (101.3 kg)  09/09/17 223 lb (101.2 kg)  07/16/17 223 lb 12.8 oz (101.5 kg)  04/09/17 223 lb 12.8 oz (101.5 kg)  03/04/17  226 lb (102.5 kg)  01/10/17 227 lb 6.4 oz (103.1 kg)  12/06/16 228 lb 6.4 oz (103.6 kg)  10/02/16 232 lb (105.2 kg)  09/03/16 230 lb (104.3 kg)  07/24/16 230 lb (104.3 kg)  07/10/16 230 lb 12.8 oz (104.7 kg)  03/27/16 226 lb (102.5 kg)  02/27/16 226 lb (102.5 kg)  12/13/15 225 lb (102.1 kg)  10/12/15 229 lb 6.4 oz (104.1 kg)  08/29/15 225 lb (102.1 kg)  08/16/15 224 lb 3.2 oz (101.7 kg)  05/17/15 221 lb 12.8 oz (100.6  kg)  04/25/15 255 lb (115.7 kg)  02/04/15 204 lb (92.5 kg)  02/01/15 221 lb (100.2 kg)  12/28/14 225 lb (102.1 kg)  08/30/14 226 lb (102.5 kg)  08/04/14 219 lb (99.3 kg)  05/11/14 223 lb 3.2 oz (101.2 kg)  04/19/14 223 lb (101.2 kg)  03/09/14 220 lb (99.8 kg)  01/26/14 217 lb 6.4 oz (98.6 kg)  01/18/14 218 lb (98.9 kg)  01/17/14 221 lb (100.2 kg)  01/16/14 221 lb (100.2 kg)  01/15/14 221 lb (100.2 kg)  01/14/14 221 lb (100.2 kg)  12/21/13 223 lb (101.2 kg)  10/26/13 225 lb (102.1 kg)  10/20/13 223 lb (101.2 kg)  07/14/13 223 lb 12.8 oz (101.5 kg)  04/07/13 223 lb (101.2 kg)  12/30/12 220 lb (99.8 kg)  09/09/12 221 lb 9.6 oz (100.5 kg)  05/29/12 222 lb 9.6 oz (101 kg)  05/25/12 227 lb (103 kg)  01/29/12 225 lb 3.2 oz (102.2 kg)  10/09/11 227 lb (103 kg)   Physical Exam Constitutional:      Comments: Thin male  HENT:     Head: Normocephalic and atraumatic.     Comments: Wears soft collar to support neck    Right Ear: External ear normal.     Left Ear: External ear normal.     Nose: Nose normal.     Mouth/Throat:     Pharynx: Oropharynx is clear.     Comments: Hypophonic speech, some drooling Eyes:     Conjunctiva/sclera: Conjunctivae normal.     Pupils: Pupils are equal, round, and reactive to light.  Cardiovascular:     Rate and Rhythm: Normal rate and regular rhythm.     Pulses: Normal pulses.     Heart sounds: Normal heart sounds.  Pulmonary:     Effort: Pulmonary effort is normal.     Breath sounds: Normal breath sounds.  Abdominal:      General: Bowel sounds are normal.     Palpations: Abdomen is soft. There is no mass.     Tenderness: There is no abdominal tenderness. There is no guarding or rebound.  Musculoskeletal:        General: Normal range of motion.     Right lower leg: No edema.     Left lower leg: No edema.  Skin:    General: Skin is warm and dry.     Coloration: Skin is pale.  Neurological:     Mental Status: He is alert and oriented to person, place, and time.     Motor: Weakness present.     Gait: Gait abnormal.     Comments: Resting tremor, required help to sit up in bed, put on shoes  Psychiatric:        Mood and Affect: Mood normal.     Comments: Some tearfulness talking about best friend of 12 plus years' death last week     CURRENT PROBLEM LIST:  Patient Active Problem List   Diagnosis Date Noted   Acute pancreatitis 08/12/2022   Possible pancreatic lesion 07/03/2022   Acute cholecystitis 07/01/2022   Hypotension 07/01/2022   Low back pain 03/29/2022   Hemarthrosis of right knee 06/30/2020   Heterotopic calcification, postoperative 06/30/2020   Total knee replacement status, right 03/22/2020   Hematospermia 11/18/2015   Epididymal cyst 05/25/2015   Parkinson disease ([place]) 01/14/2014   Obesity (BMI 30-39.9) 07/14/2013   Confusional arousals 02/13/2013   Disorder of ligament of right wrist 01/29/2012   Diabetes mellitus type 2, uncomplicated ([place]) 69/62/9528  Mixed hyperlipidemia 10/09/2011   Unilateral primary osteoarthritis, right knee 10/09/2011   Essential hypertension, benign 10/09/2011   PAST MEDICAL HISTORY:  Active Ambulatory Problems    Diagnosis Date Noted   Diabetes mellitus type 2, uncomplicated ([facility]) 26/71/2458   Mixed hyperlipidemia 10/09/2011   Unilateral primary osteoarthritis, right knee 10/09/2011   Essential hypertension, benign 10/09/2011   Disorder of ligament of right wrist 01/29/2012   Confusional arousals 02/13/2013   Obesity (BMI 30-39.9) 07/14/2013    Parkinson disease ([place]) 01/14/2014   Epididymal cyst 05/25/2015   Hematospermia 11/18/2015   Total knee replacement status, right 03/22/2020   Hemarthrosis of right knee 06/30/2020   Heterotopic calcification, postoperative 06/30/2020   Low back pain 03/29/2022   Acute cholecystitis 07/01/2022   Hypotension 07/01/2022   Possible pancreatic lesion 07/03/2022   Acute pancreatitis 08/12/2022   Resolved Ambulatory Problems    Diagnosis Date Noted   Hx of adenomatous polyp of colon 09/04/2019   Past Medical History:  Diagnosis Date   Allergy    Cataract    GERD (gastroesophageal reflux disease)    Neuromuscular disorder (HCC)    OA (osteoarthritis) of knee    Obesity, unspecified    Osteoarthritis    Parkinsons ([place])    Type II or unspecified type diabetes mellitus without mention of complication, not stated as uncontrolled    SOCIAL HX:  Social History   Tobacco Use   Smoking status: Former    Packs/day: 1.50    Years: 15.00    Total pack years: 22.50    Types: Cigarettes    Quit date: 12/17/1982    Years since quitting: 39.7   Smokeless tobacco: Never   Tobacco comments:    over 36 yrs  Substance Use Topics   Alcohol use: Yes    Comment: very rare   FAMILY HX:  Family History  Problem Relation Age of Onset   Diabetes Mother    Lung cancer Mother    Cancer Father    Heart disease Father    Diabetes Sister    Diabetes Daughter    Colon polyps Son    Colon cancer Neg Hx    Esophageal cancer Neg Hx    Rectal cancer Neg Hx    Stomach cancer Neg Hx    Parkinson's disease Neg Hx       ALLERGIES:  Allergies  Allergen Reactions   Codeine Nausea And Vomiting      PERTINENT MEDICATIONS:  Prescription amlodipine tablet; 5 mg; amt: 1; oral Special Instructions: GIVE 5 mg DAILY PO FOR HTN Once A Day 08:00 AM 09/07/2022- Open Ended Prescription aspirin [OTC] tablet,chewable; 81 mg; amt: 1 TABLET; oral Special Instructions: Give 1 tablet PO Daily for  Anti-coagulation Once A Day 08:00 AM 08/18/2022- Open Ended Prescription carbidopa-levodopa tablet extended release; 25-100 mg; amt: 1 TABLET; oral Special Instructions: times specified to be given by neurologist 5 Times Per Day 08:30 AM, 10:30 AM, 01:00 PM, 03:30 PM, 06:00 PM 08/22/2022- Open Ended Prescription carbidopa-levodopa tablet extended release; 50-200 mg; amt: 1 TABLET; oral Special Instructions: GIVE 1 TABLET EVERY NIGHT PO FOR PARKINSONS At Bedtime 09:00 PM 08/17/2022- Open Ended Prescription carbidopa-levodopa tablet; 25-100 mg; amt: 1 tab; oral Special Instructions: time specified by neurology Once A Day 09:00 PM 08/22/2022- Open Ended Prescription Cinnamon (cinnamon bark) capsule; 500 mg; amt: 1 CAPSULE; oral Special Instructions: Give 1 Capsule PO Daily for Supplement Once A Day 07:30 AM 08/21/2022- Open Ended Prescription famotidine [OTC] tablet; 20 mg; amt: 1  TABLET; oral Special Instructions: GIVE 1 TABLET IN THE MORNING AND AT BEDTIME PO FOR GERD Twice A Day 08:00 AM, 08:00 PM 08/17/2022- Open Ended Prescription fludrocortisone tablet; 0.1 mg; amt: 1 TABLET; oral Special Instructions: GIVE 1 TABLET PO DAILY FOR ADRENAL Insufficiency Once A Day 08:00 AM 08/18/2022- Open Ended Prescription glucosamine-chondroitin tablet; 500-400 mg; amt: 3 tabs; oral Special Instructions: GIVE 3 TABS PO Q DAY FOR SUPPLEMENT Once A Day 07:30 AM 08/21/2022- Open Ended Prescription melatonin tablet; 5 mg; amt: 1 tab; oral Special Instructions: for sleep At Bedtime 10:00 PM 08/27/2022- Open Ended Prescription metformin tablet; 1,000 mg; amt: 1 TABLET; oral Special Instructions: GIVE 1 TABLET BID PO FOR DM2 Twice A Day 08:00 AM, 08:00 PM 08/17/2022- Open Ended Prescription Miralax (polyethylene glycol 3350) [OTC] powder; 17 gram/dose; amt: 17 gm; oral Special Instructions: for constipation Once A Day 08:00 PM 08/29/2022- Open  Ended Prescription multivitamin tablet; - ; amt: 1 TABLET; oral Special Instructions: GIVE 1 Tablet PO daily for Supplement Once A Day 08:00 AM 08/18/2022- Open Ended Prescription Probiotic (lactobac 40-bifido 3-s.thermop) capsule; 100 billion cell; amt: 1 cap; oral Special Instructions: for loose stools Twice A Day 08:00 AM, 08:00 PM 08/27/2022- Open Ended Prescription quetiapine tablet extended release 24 hr; 50 mg; amt: 1 TABLET; oral Special Instructions: GIVE 1 TABLET PO DAILY FOR DEPRESSION Once A Day 08:00 PM 08/17/2022- Open Ended Prescription simvastatin tablet; 20 mg; amt: 1 TABLET; oral Special Instructions: GIVE 1 TABLET M,W,Fri at [place] Once A Day on Mon, Wed, Fri 06:00 PM 09/09/2022- Open Ended Prescription Vitamin C (ascorbic acid (vitamin c)) tablet; 500 mg; amt: 1 TABLET; oral Special Instructions: GIVE 1 TABLET PO DAILY FOR SUPPLEMENT Once A Day 08:00 AM 08/18/2022- Open Ended   Thank you for the opportunity to participate in the care of Danny [name].  The palliative care team will continue to follow. Please call our office at [phone] if we can be of additional assistance.   Hollace Kinnier, DO  COVID-19 PATIENT SCREENING TOOL Asked and negative response unless otherwise noted:  Have you had symptoms of covid, tested positive or been in contact with someone with symptoms/positive test in the past 5-10 days?  NO

## 2022-09-11 NOTE — Progress Notes (Addendum)
Preop instructions for:  Danny [name]     Date of Birth:    September 23, 2051                   Date of Procedure:   09/13/22 Procedure:   EUS   Surgeon:  Eva contact:  [facility]   Phone:     [place]: Wife RN contact name/phone#:Danny [name] same as main line             and Fax #: [phone]   Transportation contact phone#: Danny [name] Driver) [phone], wife will also be there    Time to arrive at [facility]: 1000   Report to: Admitting (On your left hand side)    Do not eat solid food past midnight the night before your procedure.(To include any tube feedings-must be discontinued)  May have the following until    0830 am    AM/PM day of procedure  CLEAR LIQUID DIET  Water Black Coffee (sugar ok, NO MILK/CREAM OR CREAMERS)  Tea (sugar ok, NO MILK/CREAM OR CREAMERS) regular and decaf                             Plain Jell-O (NO RED)                                           Fruit ices (not with fruit pulp, NO RED)                                     Popsicles (NO RED)                                                                  Juice: apple, WHITE grape, WHITE cranberry Sports drinks like Gatorade (NO RED)   Take these morning medications only with sips of water day of procedure.(or give through gastrostomy or feeding tube).     Aspirin, sinemet, norvasc,pepcid   Note: No Insulin or Diabetic meds should be given or taken the morning of the procedure!     Please send day of procedure:current med list and meds last taken that day, confirm nothing by mouth status from what time, Patient Demographic info( to include DNR status, problem list, allergies)   Bring Insurance card and picture ID Leave all jewelry and other valuables at place where living( no metal or rings to be worn) No contact lens Women-no make-up, no lotions,perfumes,powders Men-no colognes,lotions   Any questions day of  procedure,call  SHORT STAY-[phone]     Sent from :[facility]                   [place]                   [phone]   Sent by :           Morey Hummingbird     RN

## 2022-09-12 ENCOUNTER — Encounter: Payer: Self-pay | Admitting: Internal Medicine

## 2022-09-13 ENCOUNTER — Other Ambulatory Visit: Payer: Self-pay

## 2022-09-13 ENCOUNTER — Ambulatory Visit (HOSPITAL_COMMUNITY)
Admission: RE | Admit: 2022-09-13 | Discharge: 2022-09-13 | Disposition: A | Discharge status: Rehabilitation Facility | Payer: Medicare Other | Attending: Gastroenterology | Admitting: Gastroenterology

## 2022-09-13 ENCOUNTER — Encounter (HOSPITAL_COMMUNITY): Payer: Self-pay | Admitting: Gastroenterology

## 2022-09-13 ENCOUNTER — Ambulatory Visit (HOSPITAL_BASED_OUTPATIENT_CLINIC_OR_DEPARTMENT_OTHER): Payer: Medicare Other | Admitting: Anesthesiology

## 2022-09-13 ENCOUNTER — Ambulatory Visit (HOSPITAL_COMMUNITY): Payer: Medicare Other | Admitting: Anesthesiology

## 2022-09-13 ENCOUNTER — Encounter (HOSPITAL_COMMUNITY)
Admission: RE | Disposition: A | Discharge status: Rehabilitation Facility | Payer: Self-pay | Source: Home / Self Care | Attending: Gastroenterology

## 2022-09-13 DIAGNOSIS — K2289 Other specified disease of esophagus: Secondary | ICD-10-CM | POA: Insufficient documentation

## 2022-09-13 DIAGNOSIS — K449 Diaphragmatic hernia without obstruction or gangrene: Secondary | ICD-10-CM

## 2022-09-13 DIAGNOSIS — K8689 Other specified diseases of pancreas: Secondary | ICD-10-CM | POA: Diagnosis present

## 2022-09-13 DIAGNOSIS — Z87891 Personal history of nicotine dependence: Secondary | ICD-10-CM | POA: Diagnosis not present

## 2022-09-13 DIAGNOSIS — K859 Acute pancreatitis without necrosis or infection, unspecified: Secondary | ICD-10-CM

## 2022-09-13 DIAGNOSIS — R896 Abnormal cytological findings in specimens from other organs, systems and tissues: Secondary | ICD-10-CM | POA: Diagnosis not present

## 2022-09-13 DIAGNOSIS — I899 Noninfective disorder of lymphatic vessels and lymph nodes, unspecified: Secondary | ICD-10-CM

## 2022-09-13 DIAGNOSIS — K869 Disease of pancreas, unspecified: Secondary | ICD-10-CM | POA: Diagnosis not present

## 2022-09-13 DIAGNOSIS — K219 Gastro-esophageal reflux disease without esophagitis: Secondary | ICD-10-CM | POA: Diagnosis not present

## 2022-09-13 DIAGNOSIS — E119 Type 2 diabetes mellitus without complications: Secondary | ICD-10-CM

## 2022-09-13 DIAGNOSIS — I10 Essential (primary) hypertension: Secondary | ICD-10-CM | POA: Insufficient documentation

## 2022-09-13 DIAGNOSIS — K3189 Other diseases of stomach and duodenum: Secondary | ICD-10-CM

## 2022-09-13 DIAGNOSIS — Z794 Long term (current) use of insulin: Secondary | ICD-10-CM

## 2022-09-13 HISTORY — PX: EUS: SHX5427

## 2022-09-13 HISTORY — PX: ESOPHAGOGASTRODUODENOSCOPY: SHX5428

## 2022-09-13 HISTORY — PX: BIOPSY: SHX5522

## 2022-09-13 HISTORY — PX: FINE NEEDLE ASPIRATION: SHX5430

## 2022-09-13 LAB — GLUCOSE, CAPILLARY
Glucose-Capillary: 125 mg/dL — ABNORMAL HIGH (ref 70–99)
Glucose-Capillary: 113 mg/dL — ABNORMAL HIGH (ref 70–99)

## 2022-09-13 SURGERY — EGD (ESOPHAGOGASTRODUODENOSCOPY)
Anesthesia: Monitor Anesthesia Care

## 2022-09-13 MED ORDER — PHENYLEPHRINE 80 MCG/ML (10ML) SYRINGE FOR IV PUSH (FOR BLOOD PRESSURE SUPPORT)
PREFILLED_SYRINGE | INTRAVENOUS | Status: DC | PRN
  Administered 2022-09-13 (×2): 80 ug via INTRAVENOUS

## 2022-09-13 MED ORDER — PROPOFOL 500 MG/50ML IV EMUL
INTRAVENOUS | Status: DC | PRN
  Administered 2022-09-13: 80 ug/kg/min via INTRAVENOUS

## 2022-09-13 MED ORDER — LACTATED RINGERS IV SOLN
INTRAVENOUS | Status: AC | PRN
  Administered 2022-09-13: 1000 mL via INTRAVENOUS

## 2022-09-13 MED ORDER — LACTATED RINGERS IV SOLN: INTRAVENOUS | Status: DC

## 2022-09-13 MED ORDER — PROPOFOL 10 MG/ML IV BOLUS
INTRAVENOUS | Status: DC | PRN
  Administered 2022-09-13: 10 mg via INTRAVENOUS
  Administered 2022-09-13 (×5): 20 mg via INTRAVENOUS
  Administered 2022-09-13: 10 mg via INTRAVENOUS

## 2022-09-13 MED ORDER — SODIUM CHLORIDE 0.9 % IV SOLN: INTRAVENOUS | Status: DC

## 2022-09-13 NOTE — H&P (Signed)
GASTROENTEROLOGY PROCEDURE H&P NOTE   Primary Care Physician: [name], PA  HPI: Danny [name] is a 71 y.o. male who presents for EGD/EUS to evaluate pancreatic masslike area and some PD dilation in the setting of an elevated CA 19-9 and postcholecystectomy status with recurrent pancreatitis.  Past Medical History:  Diagnosis Date   Allergy    SEASONAL   Cataract    BILATERAL-REMOVED   Essential hypertension, benign    PT.DENIES ON AS PREVENTIVE 08/17/19   GERD (gastroesophageal reflux disease)    Hx of adenomatous polyp of colon 09/04/2019   08/2019 diminutive adenoma No recall given findings and age   Mixed hyperlipidemia    PT.DENIES,STATED ON AS PREVENTIVE 07/30/19   Neuromuscular disorder (HCC)    PARKINSONS   OA (osteoarthritis) of knee    right   Obesity, unspecified    Osteoarthritis    KNEE AND SHOULDERS   Parkinsons ([place])    Type II or unspecified type diabetes mellitus without mention of complication, not stated as uncontrolled    Past Surgical History:  Procedure Laterality Date   CATARACT EXTRACTION, BILATERAL     CHOLECYSTECTOMY N/A 07/02/2022   Procedure: LAPAROSCOPIC CHOLECYSTECTOMY WITH ICG DYE  INTRAOPERATIVE CHOLANGIOGRAM;  Surgeon: Coralie Keens, MD;  Location: [facility];  Service: General;  Laterality: N/A;   GANGLION CYST EXCISION  age 35 years   right   KNEE ARTHROSCOPY     right   KNEE ARTHROSCOPY     TOTAL KNEE ARTHROPLASTY Right 03/08/2020   Procedure: RIGHT TOTAL KNEE ARTHROPLASTY;  Surgeon: Garald Balding, MD;  Location: [facility];  Service: Orthopedics;  Laterality: Right;   WRIST FRACTURE SURGERY     WRIST SURGERY  04/2012   Current Facility-Administered Medications  Medication Dose Route Frequency Provider Last Rate Last Admin   0.9 %  sodium chloride infusion   Intravenous Continuous Mansouraty, Telford Nab., MD       lactated ringers infusion   Intravenous Continuous Mansouraty, Telford Nab., MD       lactated ringers infusion     Continuous PRN Mansouraty, Telford Nab., MD 10 mL/hr at 09/13/22 1059 1,000 mL at 09/13/22 1059    Current Facility-Administered Medications:    0.9 %  sodium chloride infusion, , Intravenous, Continuous, Mansouraty, Telford Nab., MD   lactated ringers infusion, , Intravenous, Continuous, Mansouraty, Telford Nab., MD   lactated ringers infusion, , , Continuous PRN, Mansouraty, Telford Nab., MD, Last Rate: 10 mL/hr at 09/13/22 1059, 1,000 mL at 09/13/22 1059 Allergies  Allergen Reactions   Codeine Nausea And Vomiting   Family History  Problem Relation Age of Onset   Diabetes Mother    Lung cancer Mother    Cancer Father    Heart disease Father    Diabetes Sister    Diabetes Daughter    Colon polyps Son    Colon cancer Neg Hx    Esophageal cancer Neg Hx    Rectal cancer Neg Hx    Stomach cancer Neg Hx    Parkinson's disease Neg Hx    Social History   Socioeconomic History   Marital status: Married    Spouse name: [name]   Number of children: 3   Years of education: 16   Highest education level: Bachelor's degree (e.g., BA, AB, BS)  Occupational History   Occupation: Chief Financial Officer  Tobacco Use   Smoking status: Former    Packs/day: 1.50    Years: 15.00    Total pack years: 22.50  Types: Cigarettes    Quit date: 12/17/1982    Years since quitting: 39.7   Smokeless tobacco: Never   Tobacco comments:    over 40 yrs  Vaping Use   Vaping Use: Never used  Substance and Sexual Activity   Alcohol use: Yes    Comment: very rare   Drug use: No   Sexual activity: Yes    Partners: Female  Other Topics Concern   Not on file  Social History Narrative   [name], motorcycle rider.  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  He has 3 adult children.  His wife had 2 children, one of whom is deceased.   Patient is working full-time.   Patient has a Bachelor's degree.   Patient is right-handed.   Patient drinks 6 cups of coffee daily.   Social Determinants of Health   Financial Resource  Strain: Low Risk  (01/17/2018)   Overall Financial Resource Strain (CARDIA)    Difficulty of Paying Living Expenses: Not hard at all  Food Insecurity: No Food Insecurity (01/17/2018)   Hunger Vital Sign    Worried About Running Out of Food in the Last Year: Never true    Ran Out of Food in the Last Year: Never true  Transportation Needs: No Transportation Needs (01/17/2018)   PRAPARE - Hydrologist (Medical): No    Lack of Transportation (Non-Medical): No  Physical Activity: Inactive (01/17/2018)   Exercise Vital Sign    Days of Exercise per Week: 0 days    Minutes of Exercise per Session: 0 min  Stress: No Stress Concern Present (01/17/2018)   [place]    Feeling of Stress : Not at all  Social Connections: [place] (01/17/2018)   Social Connection and Isolation Panel [NHANES]    Frequency of Communication with Friends and Family: More than three times a week    Frequency of Social Gatherings with Friends and Family: More than three times a week    Attends Religious Services: More than 4 times per year    Active Member of [employer] or Organizations: Yes    Attends Archivist Meetings: More than 4 times per year    Marital Status: Married  Human resources officer Violence: Not At Risk (01/17/2018)   Humiliation, Afraid, Rape, and Kick questionnaire    Fear of Current or Ex-Partner: No    Emotionally Abused: No    Physically Abused: No    Sexually Abused: No    Physical Exam: Today's Vitals   09/13/22 1041  BP: 104/71  Pulse: 80  Resp: 15  Temp: 98.1 F (36.7 C)  TempSrc: Oral  SpO2: 98%  Weight: 73 kg  Height: '5\' 10"'$  (1.778 m)  PainSc: 0-No pain   Body mass index is 23.09 kg/m. GEN: NAD EYE: Sclerae anicteric ENT: MMM CV: Non-tachycardic GI: Soft, NT/ND NEURO:  Alert & Oriented x 3  Lab Results: No results for input(s): "WBC", "HGB", "HCT", "PLT" in the last 72  hours. BMET No results for input(s): "NA", "K", "CL", "CO2", "GLUCOSE", "BUN", "CREATININE", "CALCIUM" in the last 72 hours. LFT No results for input(s): "PROT", "ALBUMIN", "AST", "ALT", "ALKPHOS", "BILITOT", "BILIDIR", "IBILI" in the last 72 hours. PT/INR No results for input(s): "LABPROT", "INR" in the last 72 hours.   Impression / Plan: This is a 71 y.o.male who presents for EGD/EUS to evaluate pancreatic masslike area and some PD dilation in the setting of an elevated  CA 19-9 and postcholecystectomy status with recurrent pancreatitis.  The risks of an EUS including intestinal perforation, bleeding, infection, aspiration, and medication effects were discussed as was the possibility it may not give a definitive diagnosis if a biopsy is performed.  When a biopsy of the pancreas is done as part of the EUS, there is an additional risk of pancreatitis at the rate of about 1-2%.  It was explained that procedure related pancreatitis is typically mild, although it can be severe and even life threatening, which is why we do not perform random pancreatic biopsies and only biopsy a lesion/area we feel is concerning enough to warrant the risk.   The risks and benefits of endoscopic evaluation/treatment were discussed with the patient and/or family; these include but are not limited to the risk of perforation, infection, bleeding, missed lesions, lack of diagnosis, severe illness requiring hospitalization, as well as anesthesia and sedation related illnesses.  The patient's history has been reviewed, patient examined, no change in status, and deemed stable for procedure.  The patient and/or family is agreeable to proceed.    Justice Britain, MD [facility] Office # [phone]

## 2022-09-13 NOTE — Anesthesia Preprocedure Evaluation (Signed)
Anesthesia Evaluation  Patient identified by MRN, date of birth, ID band Patient awake    Reviewed: Allergy & Precautions, NPO status , Patient's Chart, lab work & pertinent test results  Airway Mallampati: II  TM Distance: >3 FB Neck ROM: Full    Dental no notable dental hx.    Pulmonary neg pulmonary ROS, former smoker,    Pulmonary exam normal breath sounds clear to auscultation       Cardiovascular hypertension, Normal cardiovascular exam Rhythm:Regular Rate:Normal     Neuro/Psych Parkinsons DZ  Neuromuscular disease negative psych ROS   GI/Hepatic Neg liver ROS, GERD  ,  Endo/Other  diabetes, Type 2  Renal/GU negative Renal ROS  negative genitourinary   Musculoskeletal negative musculoskeletal ROS (+)   Abdominal   Peds negative pediatric ROS (+)  Hematology negative hematology ROS (+)   Anesthesia Other Findings   Reproductive/Obstetrics negative OB ROS                             Anesthesia Physical Anesthesia Plan  ASA: 3  Anesthesia Plan: MAC   Post-op Pain Management: Minimal or no pain anticipated   Induction: Intravenous  PONV Risk Score and Plan: 1 and Propofol infusion and Treatment may vary due to age or medical condition  Airway Management Planned: Simple Face Mask  Additional Equipment:   Intra-op Plan:   Post-operative Plan:   Informed Consent: I have reviewed the patients History and Physical, chart, labs and discussed the procedure including the risks, benefits and alternatives for the proposed anesthesia with the patient or authorized representative who has indicated his/her understanding and acceptance.     Dental advisory given  Plan Discussed with: CRNA and Surgeon  Anesthesia Plan Comments:         Anesthesia Quick Evaluation

## 2022-09-13 NOTE — Anesthesia Postprocedure Evaluation (Signed)
Anesthesia Post Note  Patient: Danny [name]  Procedure(s) Performed: UPPER ENDOSCOPIC ULTRASOUND (EUS) RADIAL ESOPHAGOGASTRODUODENOSCOPY (EGD) BIOPSY FINE NEEDLE ASPIRATION (FNA) LINEAR     Patient location during evaluation: PACU Anesthesia Type: MAC Level of consciousness: awake and alert Pain management: pain level controlled Vital Signs Assessment: post-procedure vital signs reviewed and stable Respiratory status: spontaneous breathing, nonlabored ventilation, respiratory function stable and patient connected to nasal cannula oxygen Cardiovascular status: stable and blood pressure returned to baseline Postop Assessment: no apparent nausea or vomiting Anesthetic complications: no   No notable events documented.  Last Vitals:  Vitals:   09/13/22 1240 09/13/22 1250  BP: 136/75 121/71  Pulse: 77 77  Resp: 17 17  Temp:    SpO2: 97% 96%    Last Pain:  Vitals:   09/13/22 1250  TempSrc:   PainSc: 0-No pain                 Doshie Maggi S

## 2022-09-13 NOTE — Op Note (Addendum)
[facility] Patient Name: Danny [name] Procedure Date: 09/13/2022 MRN: [id number] Attending MD: Justice Britain , MD Date of Birth: 24-Jul-1951 CSN: [id number] Age: 71 Admit Type: Outpatient Procedure:                Upper EUS Indications:              Dilated pancreatic duct on MRCP, Suspected mass in                            pancreas on MRCP, Acute pancreatitis Providers:                Justice Britain, MD, Carlyn Reichert, RN, William Dalton, Technician Referring MD:             Blanchard Mane. Allen Medicines:                Monitored Anesthesia Care Complications:            No immediate complications. Estimated Blood Loss:     Estimated blood loss was minimal. Procedure:                Pre-Anesthesia Assessment:                           - Prior to the procedure, a History and Physical                            was performed, and patient medications and                            allergies were reviewed. The patient's tolerance of                            previous anesthesia was also reviewed. The risks                            and benefits of the procedure and the sedation                            options and risks were discussed with the patient.                            All questions were answered, and informed consent                            was obtained. Prior Anticoagulants: The patient has                            taken no previous anticoagulant or antiplatelet                            agents. ASA Grade Assessment: III - A patient with  severe systemic disease. After reviewing the risks                            and benefits, the patient was deemed in                            satisfactory condition to undergo the procedure.                           After obtaining informed consent, the endoscope was                            passed under direct vision. Throughout the                             procedure, the patient's blood pressure, pulse, and                            oxygen saturations were monitored continuously. The                            [id number]-H190 ([id number]) Olympus endoscope was introduced                            through the mouth, and advanced to the second part                            of duodenum. The [id number]-[id number] ([id number]) Olympus                            duodenoscope was introduced through the mouth, and                            advanced to the area of papilla. The [id number]                            ([id number]) Olympus linear ultrasound scope was                            introduced through the mouth, and advanced to the                            duodenum for ultrasound examination from the                            stomach and duodenum. The upper EUS was                            accomplished without difficulty. The patient                            tolerated the procedure. Scope In: Scope Out: Findings:      ENDOSCOPIC FINDING: :      No gross lesions were noted in  the proximal esophagus and in the mid       esophagus.      The Z-line was irregular and was found 33 cm from the incisors.      Circumferential salmon-colored mucosa was present from 33 to 37 cm in       the distal esophagus. No other visible abnormalities were present.       Biopsies were taken with a cold forceps for histology to rule in/out       Barrett's esophagus.      A 3 cm hiatal hernia was present.      Striped mildly erythematous mucosa without bleeding was found in the       gastric antrum.      No other gross lesions were noted in the entire examined stomach.       Biopsies were taken with a cold forceps for histology and Helicobacter       pylori testing.      No gross lesions were noted in the duodenal bulb, in the first portion       of the duodenum and in the second portion of the duodenum.      The major papilla was normal.      ENDOSONOGRAPHIC FINDING:  :      An irregular masslike region was identified in the genu/body of the       pancreas. This area was hypoechoic. The region measured 25 mm by 21 mm       in maximal cross-sectional diameter. The endosonographic borders were       poorly-defined. There was sonographic evidence suggesting abutment of       the splenic artery. An intact interface was seen between the mass and       the superior mesenteric artery and celiac trunk suggesting a lack of       invasion. The remainder of the pancreas was examined. The       endosonographic appearance of parenchyma and the upstream pancreatic       duct indicated very minimal duct dilation, parenchymal atrophy and       parenchymal calcifications. Fine needle biopsy was performed of the       region. Color Doppler imaging was utilized prior to needle puncture to       confirm a lack of significant vascular structures within the needle       path. Six passes were made with the [place] acquire 22 gauge       ultrasound core biopsy needle using a transgastric approach. Visible       cores of tissue were obtained. Preliminary cytologic examination and       touch preps were performed. Final cytology results are pending.      There was no sign of significant endosonographic abnormality in the       common bile duct entheses 2.9 mm and in the common hepatic duct see 6.9       mm. Ducts with regular contour were identified.      Endosonographic imaging of the ampulla showed no intramural       (subepithelial) lesion.      Endosonographic imaging in the visualized portion of the liver showed no       mass.      No malignant-appearing lymph nodes were visualized in the celiac region       (level 20), peripancreatic region and porta hepatis region.      The celiac region  was visualized. Impression:               EGD impression:                           - No gross lesions in esophagus proximally.                           - Z-line irregular,  33 cm from the incisors.                            Salmon-colored mucosa suspicious for long-segment                            Barrett's esophagus noted distally - biopsied.                           - 3 cm hiatal hernia.                           - Erythematous mucosa in the antrum. No gross                            lesions in the stomach. Biopsied.                           - No gross lesions in the duodenal bulb, in the                            first portion of the duodenum and in the second                            portion of the duodenum.                           - Normal major papilla.                           Impression:                           - A masslike area was identified in the genu/body                            of pancreas. Cytology results are pending. However,                            the endosonographic appearance is highly suspicious                            for adenocarcinoma. This was staged T2 N0 Mx by                            endosonographic criteria. The staging applies if  malignancy is confirmed. Fine needle biopsy                            performed. Based on the imaging findings MRI there                            has been concern that this area was approximately 4                            cm in size so T staging would change if that were                            to be true but I am more suspicious that the area                            is just a result of recent pancreatitis changes                            more than truly being a 4 cm mass.                           - There was no sign of significant pathology in the                            common bile duct and in the common hepatic duct.                           - No malignant-appearing lymph nodes were                            visualized in the celiac region (level 20),                            peripancreatic region and porta hepatis  region. Moderate Sedation:      Not Applicable - Patient had care per Anesthesia. Recommendation:           - The patient will be observed post-procedure,                            until all discharge criteria are met.                           - Discharge patient to home.                           - Monitor for signs/symptoms of bleeding,                            perforation, and infection. If issues please call                            our number to get further assistance as needed.                           -  Observe patient's clinical course.                           - Await cytology results and await path results.                           - Pending pathology, may consider PPI initiation if                            the patient has evidence of Barrett's esophagus.                           - Low-fat diet for next 1 to 2 weeks.                           - The findings and recommendations were discussed                            with the patient.                           - The findings and recommendations were discussed                            with the patient's family. Procedure Code(s):        --- Professional ---                           [phone], Esophagogastroduodenoscopy, flexible,                            transoral; with transendoscopic ultrasound-guided                            intramural or transmural fine needle                            aspiration/biopsy(s), (includes endoscopic                            ultrasound examination limited to the esophagus,                            stomach or duodenum, and adjacent structures) Diagnosis Code(s):        --- Professional ---                           K22.8, Other specified diseases of esophagus                           K44.9, Diaphragmatic hernia without obstruction or                            gangrene                           K31.89, Other  diseases of stomach and duodenum                           K86.89,  Other specified diseases of pancreas                           I89.9, Noninfective disorder of lymphatic vessels                            and lymph nodes, unspecified                           K85.90, Acute pancreatitis without necrosis or                            infection, unspecified                           R93.3, Abnormal findings on diagnostic imaging of                            other parts of digestive tract CPT copyright 2019 American Medical Association. All rights reserved. The codes documented in this report are preliminary and upon coder review may  be revised to meet current compliance requirements. Justice Britain, MD 09/13/2022 12:50:11 PM Number of Addenda: 0

## 2022-09-13 NOTE — Discharge Instructions (Signed)
YOU HAD AN ENDOSCOPIC PROCEDURE TODAY: Refer to the procedure report and other information in the discharge instructions given to you for any specific questions about what was found during the examination. If this information does not answer your questions, please call [place] office at [phone] to clarify.   YOU SHOULD EXPECT: Some feelings of bloating in the abdomen. Passage of more gas than usual. Walking can help get rid of the air that was put into your GI tract during the procedure and reduce the bloating. If you had a lower endoscopy (such as a colonoscopy or flexible sigmoidoscopy) you may notice spotting of blood in your stool or on the toilet paper. Some abdominal soreness may be present for a day or two, also.  DIET: Your first meal following the procedure should be a light meal and then it is ok to progress to your normal diet. A half-sandwich or bowl of soup is an example of a good first meal. Heavy or fried foods are harder to digest and may make you feel nauseous or bloated. Drink plenty of fluids but you should avoid alcoholic beverages for 24 hours. If you had a esophageal dilation, please see attached instructions for diet.    ACTIVITY: Your care partner should take you home directly after the procedure. You should plan to take it easy, moving slowly for the rest of the day. You can resume normal activity the day after the procedure however YOU SHOULD NOT DRIVE, use power tools, machinery or perform tasks that involve climbing or major physical exertion for 24 hours (because of the sedation medicines used during the test).   SYMPTOMS TO REPORT IMMEDIATELY: A gastroenterologist can be reached at any hour. Please call [phone]  for any of the following symptoms:   Following upper endoscopy (EGD, EUS, ERCP, esophageal dilation) Vomiting of blood or coffee ground material  New, significant abdominal pain  New, significant chest pain or pain under the shoulder blades  Painful or  persistently difficult swallowing  New shortness of breath  Black, tarry-looking or red, bloody stools  FOLLOW UP:  If any biopsies were taken you will be contacted by phone or by letter within the next 1-3 weeks. Call [phone]  if you have not heard about the biopsies in 3 weeks.  Please also call with any specific questions about appointments or follow up tests.  

## 2022-09-13 NOTE — Transfer of Care (Signed)
Immediate Anesthesia Transfer of Care Note  Patient: Danny [name]  Procedure(s) Performed: UPPER ENDOSCOPIC ULTRASOUND (EUS) RADIAL ESOPHAGOGASTRODUODENOSCOPY (EGD) BIOPSY FINE NEEDLE ASPIRATION (FNA) LINEAR  Patient Location: Endoscopy Unit  Anesthesia Type:MAC  Level of Consciousness: drowsy  Airway & Oxygen Therapy: Patient Spontanous Breathing  Post-op Assessment: Report given to RN and Post -op Vital signs reviewed and stable  Post vital signs: Reviewed and stable  Last Vitals:  Vitals Value Taken Time  BP 125/70 09/13/22 1230  Temp    Pulse 80 09/13/22 1230  Resp 14 09/13/22 1230  SpO2 96 % 09/13/22 1230  Vitals shown include unvalidated device data.  Last Pain:  Vitals:   09/13/22 1041  TempSrc: Oral  PainSc: 0-No pain         Complications: No notable events documented.

## 2022-09-14 ENCOUNTER — Encounter: Payer: Self-pay | Admitting: Gastroenterology

## 2022-09-14 DIAGNOSIS — C259 Malignant neoplasm of pancreas, unspecified: Secondary | ICD-10-CM

## 2022-09-14 HISTORY — DX: Malignant neoplasm of pancreas, unspecified: C25.9

## 2022-09-14 LAB — CYTOLOGY - NON PAP

## 2022-09-14 LAB — SURGICAL PATHOLOGY

## 2022-09-17 ENCOUNTER — Encounter (HOSPITAL_COMMUNITY): Payer: Self-pay | Admitting: Gastroenterology

## 2022-09-17 ENCOUNTER — Other Ambulatory Visit: Payer: Self-pay

## 2022-09-17 DIAGNOSIS — K869 Disease of pancreas, unspecified: Secondary | ICD-10-CM

## 2022-09-17 DIAGNOSIS — C801 Malignant (primary) neoplasm, unspecified: Secondary | ICD-10-CM

## 2022-09-17 NOTE — Progress Notes (Signed)
I spoke with Mrs Babers, they are able to come to a consultation with Dr Burr Medico on 10/5 at 1500.  I asked her to arrive at 1445 for registration.  I asked that she bring a list of his current medications.  She is aware of our location and  our free valet services.  I provided my direct contact information.  All questions were answered.  She verbalized understanding.

## 2022-09-18 ENCOUNTER — Other Ambulatory Visit: Payer: Self-pay | Admitting: Genetic Counselor

## 2022-09-18 ENCOUNTER — Telehealth: Payer: Self-pay | Admitting: Neurology

## 2022-09-18 DIAGNOSIS — Z1379 Encounter for other screening for genetic and chromosomal anomalies: Secondary | ICD-10-CM

## 2022-09-18 NOTE — Telephone Encounter (Signed)
Pt's wife is calling stating the DME is telling her that pt must have an appointment with Jinny Blossom, NP since pt saw her about a wheelchair, she is being told that once pt has seen Jinny Blossom they will then mail the paperwork to the office for Jinny Blossom, NP to complete. Wife was reminded that pt has an upcoming appointment with Dr Rexene Alberts in less than a month and that an appointment with her should suffice.  Wife is asking for a call to clarify in order for pt to move forward in getting the wheelchair for pt.

## 2022-09-18 NOTE — Telephone Encounter (Signed)
Pt has appt 10-18-2022 with Dr. Rexene Alberts for PD.  Ok to also address for power wheelchair. (Already has order just needs recent ofv note within 30 days) or  see in another appt.

## 2022-09-18 NOTE — Telephone Encounter (Signed)
There are certain provisos that have to be met to qualify for a power chair for Parkinson's disease, we have to dedicate the appointment to that indication.

## 2022-09-19 NOTE — Telephone Encounter (Signed)
Pt scheduled with Dr. Rexene Alberts for 09/20/22 at 9:45am

## 2022-09-20 ENCOUNTER — Other Ambulatory Visit: Payer: Self-pay

## 2022-09-20 ENCOUNTER — Inpatient Hospital Stay: Payer: Medicare Other

## 2022-09-20 ENCOUNTER — Encounter: Payer: Self-pay | Admitting: Hematology

## 2022-09-20 ENCOUNTER — Ambulatory Visit (INDEPENDENT_AMBULATORY_CARE_PROVIDER_SITE_OTHER): Payer: Medicare Other | Admitting: Neurology

## 2022-09-20 ENCOUNTER — Encounter: Payer: Self-pay | Admitting: Neurology

## 2022-09-20 ENCOUNTER — Telehealth: Payer: Self-pay

## 2022-09-20 ENCOUNTER — Inpatient Hospital Stay: Payer: Medicare Other | Attending: Hematology | Admitting: Hematology

## 2022-09-20 VITALS — BP 133/76 | HR 68 | Wt 157.6 lb

## 2022-09-20 VITALS — BP 94/65 | HR 92 | Temp 97.5°F | Resp 18

## 2022-09-20 DIAGNOSIS — G20A2 Parkinson's disease without dyskinesia, with fluctuations: Secondary | ICD-10-CM | POA: Diagnosis not present

## 2022-09-20 DIAGNOSIS — G620 Drug-induced polyneuropathy: Secondary | ICD-10-CM | POA: Diagnosis not present

## 2022-09-20 DIAGNOSIS — C251 Malignant neoplasm of body of pancreas: Secondary | ICD-10-CM | POA: Diagnosis present

## 2022-09-20 DIAGNOSIS — G20A1 Parkinson's disease without dyskinesia, without mention of fluctuations: Secondary | ICD-10-CM | POA: Diagnosis not present

## 2022-09-20 DIAGNOSIS — E119 Type 2 diabetes mellitus without complications: Secondary | ICD-10-CM | POA: Diagnosis not present

## 2022-09-20 DIAGNOSIS — I10 Essential (primary) hypertension: Secondary | ICD-10-CM | POA: Insufficient documentation

## 2022-09-20 DIAGNOSIS — Z1379 Encounter for other screening for genetic and chromosomal anomalies: Secondary | ICD-10-CM

## 2022-09-20 DIAGNOSIS — Z7409 Other reduced mobility: Secondary | ICD-10-CM | POA: Diagnosis not present

## 2022-09-20 DIAGNOSIS — Z9181 History of falling: Secondary | ICD-10-CM

## 2022-09-20 DIAGNOSIS — Z5111 Encounter for antineoplastic chemotherapy: Secondary | ICD-10-CM | POA: Insufficient documentation

## 2022-09-20 DIAGNOSIS — C259 Malignant neoplasm of pancreas, unspecified: Secondary | ICD-10-CM | POA: Insufficient documentation

## 2022-09-20 LAB — COMPREHENSIVE METABOLIC PANEL
ALT: 8 U/L (ref 0–44)
AST: 12 U/L — ABNORMAL LOW (ref 15–41)
Albumin: 4 g/dL (ref 3.5–5.0)
Alkaline Phosphatase: 81 U/L (ref 38–126)
Anion gap: 7 (ref 5–15)
BUN: 21 mg/dL (ref 8–23)
CO2: 28 mmol/L (ref 22–32)
Calcium: 9.2 mg/dL (ref 8.9–10.3)
Chloride: 102 mmol/L (ref 98–111)
Creatinine, Ser: 0.93 mg/dL (ref 0.61–1.24)
GFR, Estimated: >60 mL/min (ref >60)
Glucose, Bld: 180 mg/dL — ABNORMAL HIGH (ref 70–99)
Potassium: 4.4 mmol/L (ref 3.5–5.1)
Sodium: 137 mmol/L (ref 135–145)
Total Bilirubin: 0.9 mg/dL (ref 0.3–1.2)
Total Protein: 7.2 g/dL (ref 6.5–8.1)

## 2022-09-20 LAB — CBC WITH DIFFERENTIAL/PLATELET
Abs Immature Granulocytes: 0.04 10*3/uL (ref 0.00–0.07)
Basophils Absolute: 0.1 10*3/uL (ref 0.0–0.1)
Basophils Relative: 1 %
Eosinophils Absolute: 0.5 10*3/uL (ref 0.0–0.5)
Eosinophils Relative: 5 %
HCT: 38 % — ABNORMAL LOW (ref 39.0–52.0)
Hemoglobin: 12.9 g/dL — ABNORMAL LOW (ref 13.0–17.0)
Immature Granulocytes: 1 %
Lymphocytes Relative: 19 %
Lymphs Abs: 1.7 10*3/uL (ref 0.7–4.0)
MCH: 31 pg (ref 26.0–34.0)
MCHC: 33.9 g/dL (ref 30.0–36.0)
MCV: 91.3 fL (ref 80.0–100.0)
Monocytes Absolute: 0.5 10*3/uL (ref 0.1–1.0)
Monocytes Relative: 6 %
Neutro Abs: 5.9 10*3/uL (ref 1.7–7.7)
Neutrophils Relative %: 68 %
Platelets: 208 10*3/uL (ref 150–400)
RBC: 4.16 MIL/uL — ABNORMAL LOW (ref 4.22–5.81)
RDW: 14.6 % (ref 11.5–15.5)
WBC: 8.7 10*3/uL (ref 4.0–10.5)
nRBC: 0 % (ref 0.0–0.2)

## 2022-09-20 LAB — GENETIC SCREENING ORDER

## 2022-09-20 MED ORDER — ONDANSETRON HCL 8 MG PO TABS: 8.0000 mg | ORAL_TABLET | Freq: Three times a day (TID) | ORAL | 1 refills | Status: DC | PRN

## 2022-09-20 MED ORDER — LIDOCAINE-PRILOCAINE 2.5-2.5 % EX CREA: TOPICAL_CREAM | CUTANEOUS | 3 refills | Status: DC

## 2022-09-20 MED ORDER — PROCHLORPERAZINE MALEATE 10 MG PO TABS: 10.0000 mg | ORAL_TABLET | Freq: Four times a day (QID) | ORAL | 1 refills | Status: DC | PRN

## 2022-09-20 NOTE — Telephone Encounter (Signed)
11/9 at 3 pm will be held for this pt.

## 2022-09-20 NOTE — Progress Notes (Signed)
Fax confirmation for pts ultra lightweight wheelchair to [facility] [phone], [phone] RECEIVED.

## 2022-09-20 NOTE — Progress Notes (Signed)
Subjective:    Patient ID: Danny [name] is a 71 y.o. male.  HPI    Danny Age, MD, PhD [facility] [address], [address] [place], [place] [address]  Danny [name] is a 71 year old right-handed gentleman with an underlying medical history of type 2 diabetes, osteoarthritis, status post right knee replacement in March 2021, hypertension, obesity, hyperlipidemia, cataracts, cholelithiasis with status post right cholecystectomy on 07/02/2022, recurrent pancreatitis and recent diagnosis of pancreatic adenocarcinoma, who presents for follow-up consultation of his advanced Parkinson's disease, complicated by decline in mobility, knee pain on the R, history of IBD, memory loss, hallucinations, and recurrent falls.  The patient is accompanied by his wife again today and presents for a sooner than scheduled appointment to discuss his ultralight weight manual wheelchair with custom fit.  I last saw him on 01/09/2022, at which time he reported further decline in his mobility and change in his posture.  He had fallen.  He had had low blood pressure values especially in the mornings.  He was having fairly consistent hallucinations.  He needed help with showering and dressing as well as putting his compression socks on.  I suggested he taper off pramipexole.  He was advised to continue with generic Sinemet 1 pill 6 times a day and Sinemet CR at bedtime.  He saw Danny Givens, NP on 04/17/2022, at which time he was doing home health PT.  He was using his manual wheelchair primarily.  A motorized wheelchair was discussed at the time.  He did not have the strength to propel himself in a manual wheelchair.  Today, 09/20/2022: His wife provides most of his history.  They have a regular wheelchair but it is very heavy and she has a hard time lifting the wheelchair into the car and out of the car.  He has had mobility decline, it is difficult for him to propel with the regular  wheelchair.  He has been evaluated by physical therapy for a ultralight weight wheelchair and I reviewed the physical therapy evaluation and recommendations from 06/20/2022.  I agree with their input and appreciate their recommendation.  Unfortunately, he has lost weight.  He has had multiple ER visits and hospitalizations.  He has recently been diagnosed with pancreatic cancer and in fact, has an appointment with oncology today.  He has lost appetite and has lost weight.  He has been assessed for his ability to propel an ultralight manual wheelchair and did well with it.  His wife reports that he was in inpatient rehab until 09/17/2022.  Rehab was really helpful.  The patient's allergies, current medications, family history, past medical history, past social history, past surgical history and problem list were reviewed and updated as appropriate.      Previously (copied from previous notes for reference):      I saw him on 11/01/2021, at which time I wrote for a manual wheelchair.  I continued his Florinef and his other medications.  He was having daily consistent hallucinations per wife.  We had tried Nuplazid before.  A trial of Nourianz for off time resulted in worsening hallucinations.  His blood pressure was typically lower in the first part of the day and he was on half a pill of Florinef.    Patient's wife called in November 2022 reporting that patient had a decline in his health and that his speech had changed.  He was seen by his primary care nurse PA.  He presented to the emergency  room on 11/08/2021 for slurring of speech lasting several days.  I reviewed the emergency room records.  He had a CT angiogram of the head and neck with and without contrast on 11/08/2021 and I reviewed the results:   IMPRESSION: 1. No acute intracranial pathology or large vessel occlusion. 2. Mild calcified atherosclerotic plaque in the bilateral carotid bulbs without hemodynamically significant stenosis or  occlusion. Otherwise, patent vasculature in the head and neck.   He had a brain MRI without contrast on 11/08/2021 and I reviewed the results: IMPRESSION: No evidence of acute intracranial abnormality.  No acute infarct.   Laboratory studies showed a benign urinalysis, CBC with differential benign, CMP showed BUN of 15, creatinine 1.15, normal sodium and potassium levels, normal liver enzymes, total bilirubin slightly elevated at 1.5.   Patient's wife called in January 2023 reporting that he has had a steady decline over the past 1 or 2 months.  She reported that he had COVID around Christmas 2022.  She reported a recent fall.     I saw him on 01/11/2021, at which time we mutually agreed to continue his medication regimen.  He was on low-dose Seroquel per primary care.  He was on Florinef for low blood pressure values.   He saw Danny Browner, NP in the interim on 05/25/2021, at which time his wife reported frequent falls.  He was kept on the same medication regimen.       I saw him on 10/11/20, at which time he reported lack of stamina and feeling overall weaker, having more freezing and more off time.  He did not increase his Sinemet to 1 pill 6 times a day as discussed during the visit prior.  He had lost quite a bit of weight.  He had some low blood pressure values.  He was advised to increase his Sinemet only mutually agreed to try him on Nourianz.   His wife called about a month later reporting that he had more hallucinations on the [place].  Unfortunately, they were not able to afford the Nuplazid.     I saw him on 07/06/2020, at which time he had more difficulty relating his own history and he had more decline in his motor function.  He had fallen multiple times.  He had knee replacement surgery.  They could not afford the prescription for Nuplazid long-term.  He was advised to increase his Sinemet to 1 pill 6 times a day and continue with the Sinemet CR at bedtime.  He had stable  hallucinations.  He continues to take low-dose Mirapex 0.5 mg 3 times daily.    I saw him on 02/11/2020, at which time we talked about his medication regimen, recent fall, and he needed clearance for his right knee replacement.   He had an appointment in the interim with Debbora Presto, nurse practitioner on 05/23/2020, at which time Sinemet CR was added at bedtime and he was advised to start Nuplazid.   However, his wife reported back that Nuplazid was not started secondary to cost.         I saw him on 10/05/2019, at which time he reported feeling tired during the day.  His wife endorsed that he was quite sleepy during the day, particularly in the early evenings.  He has had some falls.  He had bruised his sternum and ribs.  He did fall in his kitchen and hit his head against a curtain holder and had a small area of skin injury in the  right forehead.  He was advised to continue with Sinemet 1 pill 5 times a day but advised to further reduce his Mirapex to 0.5 mg 3 times daily.  I asked him to have a head CT done.  He had a head CT without contrast on 10/08/2020 and I reviewed the results: IMPRESSION:    Normal CT head (without).  We called him with his test results.     I saw him on 06/04/2019, at which time he reported difficulty with his mobility.  He had fallen, thankfully without major injuries.  His wife had noticed more forgetfulness and some confusion at times.  He was more sleepy during the day.  He was taking Sinemet 4 times a day and had more difficulty in the mornings.  He was on Mirapex 3 times a day.  He had intermittent constipation.  He reported fleeting hallucinations, nothing sustained or anxiety provoking.  He was advised to increase his Sinemet to 1 pill 5 times a day at 3 hourly intervals starting at 8 AM.  He was advised to continue with Mirapex 3 times daily.  He was advised to be very proactive about constipation issues.    I saw him on 12/03/2018, at which time he was advised to  continue with Sinemet 1 pill 4 times a day.  He did have her fall incident after he missed the chair when sitting down.  His memory was stable for the most part.  He was advised to hydrate well and be proactive about constipation issues, we talked about fall prevention.     I saw him on 06/05/2018, at which time he felt fairly stable but his wife had noticed more shuffling and also some mood irritability. He had no falls thankfully and some forgetfulness was noted. He was more sleepy during the day. Suggested he continue with generic Mirapex 0.75 mg 3 times a day but increase his Sinemet from 3 times a day to 1 pill 4 times a day.   His wife called in the interim in October 2019 requesting a letter to support their cancellation of their timeshare as he was not able to drive and they were not able to utilize their timeshare.    I saw him on 12/05/2017, at which time he felt fairly stable on Sinemet, we had reduced his Mirapex to 0.75 mg 3 times a day. He is trying to stay active. He had no recent falls. I suggested we continue with his Sinemet and Mirapex at the current doses.   I saw him on 09/09/2017 at which time he had been more sleepy during the day. His wife was worried that he was more preoccupied with sex. He had ongoing issues with intermittent double vision. He had intermittent and mild dream enactments. Suggested we gradually start him on low-dose Sinemet starting with half pill and gradually increasing this to 1 pill 3 times a day. His wife called in the interim in October reporting that he had done fairly well with Sinemet, at which time we reduced his Mirapex from 1 mg 3 times a day to 0.75 mg 3 times a day.      I saw him on 03/04/2017, at which time he felt stable. He retired in December 2017. The daughter was living with them. Mother-in-law had moved in as well. His wife retired also. He was going to the gym on a regular basis. Memory-wise, he had mild forgetfulness, no mood issues, no  sleep issues, no side effects from the  Mirapex. He had no recent falls thankfully. He had some mild dream enactments but no major issues. Looking back, his symptoms of peak date back to 2014, he had a sleep study in early 2014 with PLMS noted but no OSA and he denies restless leg symptoms. We mutually agreed to taper him off of Azilect as it was too expensive. I suggested in lieu of the Azilect, we increase his pramipexole to 1 mg 3 times a day.     I saw him on 09/03/2016, at which time he reported doing well, no telltale differences after increasing the Mirapex. He gave up driving. He decided to retire by the end of December 2017. His mother-in-law was supposed to move in with them. His memory was stable, sleep was stable, no real mood issues, no recent falls. I suggested we keep his medications the same including Azilect 1 mg once daily, Mirapex 0.75 mg 3 times a day.   I saw him on 02/27/2016, at which time he reported more difficulty with his walking, he had become slower. Wife had noted that it would take him longer to do things. He was dragging his feet at times. He was not swinging his arms as well. He was working full-time as a Land, avoiding stairs and heights. His A1c in December 2016 was a little up at 7.6. Memory was stable, with some mild forgetfulness. Mood was stable, RBD was infrequent. He reported no recent falls. I suggested he continue with Azilect and we increased his Mirapex to 0.75 mg 3 times a day.    I saw him on 08/29/2015 at which time he reported doing fairly well. He was able to tolerate Azilect. His Mirapex was 0.5 mg 3 times a day. He was working at the computer most of the time. He was not always drinking enough water. He had a good appetite. He felt his memory was stable. RBD was less frequent but still occurring. His wife was able to sleep in the same bed with him. He had no recent falls. He had some recent blurry vision was going to see his ophthalmologist. We  mutually agreed to continue his current medication regimen.   I saw him on 04/25/2015, at which time he reported doing fairly well overall, but had noted smaller changes, including more slowness, difficulty with climbing ladders. He was advised by his boss to not climb ladders or walk over obstacles at work. Thankfully, his work environment is understanding. He had another business trip coming up and a colleague was to drive and do the climbing on the sites. He reported mild forgetfulness and some problems with depth perception. His diplopia had improved since he was given prism glasses by his ophthalmologist. I suggested, we try him on Azilect and keep his Mirapex the same.    I saw him on 02/04/2015, at which time he reported ongoing issues with diplopia, particularly at night while driving. He was still working full-time. He was still on low-dose Mirapex, 0.25 mg 3 times a day. He had no side effects. He had had a sleep study which per his report did not show any OSA. I asked him to make an appointment with his eye doctor. I suggested an increase in Mirapex to 0.5 mg 3 times a day.   I first met him on 12/28/2014, at which time he reported that he was having more fatigue. However, he also had reduce his caffeine intake. He had no recent falls and mood stable. He was working full-time.  He reported that he had intermittent double vision in the last 3 months. His PCP requested a sooner than scheduled appointment for diplopia. His exam was stable at the time and I suggested no new medication changes.   He has had some short-term memory issues. He works full-time. He is an Chief Financial Officer. He likes square dance but his wife has noted that he has had more problems with his nighttime driving and his squared hands. In the recent 3 months he has had some intermittent double vision at night. He has drooling at night. He rarely drinks alcohol and usually drinks 4-5 cups of coffee per day, occasional sodas. He quit smoking  in 1978. Blood pressure is low for him today. It was recently noted to be trending lower and his blood pressure medication was reduced. He takes his cholesterol medication every other day.   He had a brain MRI without contrast on 11/04/2013: Normal MRI scan of the brain. Incidental findings of a chronic paranasal sinusitis with deviated nasal septum to the left.  He previously followed with Dr. Jim Like and was last seen by him on 08/30/2014, at which time his Mirapex was kept at 0.25 mg 3 times a day. He reported no side effects, in particular no impulse control disorder. I reviewed Dr. Hazle Quant notes. He first met Dr. Janann Colonel on 10/26/13, at which time the patient reported a right hand tremor noticed over the previous 4-6 months. He had changes in his handwriting, slowness, some stiffness and changes in his walking.  His wife reported a longer standing history of REM behavior disorder. He has been in outpatient physical therapy.  His Past Medical History Is Significant For: Past Medical History:  Diagnosis Date   Allergy    SEASONAL   Cataract    BILATERAL-REMOVED   Essential hypertension, benign    PT.DENIES ON AS PREVENTIVE 08/17/19   GERD (gastroesophageal reflux disease)    Hx of adenomatous polyp of colon 09/04/2019   08/2019 diminutive adenoma No recall given findings and [name]   Mixed hyperlipidemia    PT.DENIES,STATED ON AS PREVENTIVE 07/30/19   Neuromuscular disorder (HCC)    PARKINSONS   OA (osteoarthritis) of knee    right   Obesity, unspecified    Osteoarthritis    KNEE AND SHOULDERS   Pancreatic cancer ([place]) 09/14/2022   diagnosed   Parkinsons    Type II or unspecified type diabetes mellitus without mention of complication, not stated as uncontrolled     His Past Surgical History Is Significant For: Past Surgical History:  Procedure Laterality Date   BIOPSY  09/13/2022   Procedure: BIOPSY;  Surgeon: Irving Copas., MD;  Location: [facility];  Service:  Gastroenterology;;   CATARACT EXTRACTION, BILATERAL     CHOLECYSTECTOMY N/A 07/02/2022   Procedure: LAPAROSCOPIC CHOLECYSTECTOMY WITH ICG DYE  INTRAOPERATIVE CHOLANGIOGRAM;  Surgeon: Coralie Keens, MD;  Location: [facility];  Service: General;  Laterality: N/A;   ESOPHAGOGASTRODUODENOSCOPY N/A 09/13/2022   Procedure: ESOPHAGOGASTRODUODENOSCOPY (EGD);  Surgeon: Irving Copas., MD;  Location: [facility];  Service: Gastroenterology;  Laterality: N/A;   EUS N/A 09/13/2022   Procedure: UPPER ENDOSCOPIC ULTRASOUND (EUS) RADIAL;  Surgeon: Rush Landmark Telford Nab., MD;  Location: [facility];  Service: Gastroenterology;  Laterality: N/A;   FINE NEEDLE ASPIRATION  09/13/2022   Procedure: FINE NEEDLE ASPIRATION (FNA) LINEAR;  Surgeon: Irving Copas., MD;  Location: [facility];  Service: Gastroenterology;;   GANGLION CYST EXCISION  [name] 52 years   right   KNEE ARTHROSCOPY  right   KNEE ARTHROSCOPY     TOTAL KNEE ARTHROPLASTY Right 03/08/2020   Procedure: RIGHT TOTAL KNEE ARTHROPLASTY;  Surgeon: Garald Balding, MD;  Location: [facility];  Service: Orthopedics;  Laterality: Right;   WRIST FRACTURE SURGERY     WRIST SURGERY  04/2012    His Family History Is Significant For: Family History  Problem Relation [name] of Onset   Diabetes Mother    Lung cancer Mother    Cancer Father    Heart disease Father    Diabetes Sister    Diabetes Daughter    Colon polyps Son    Colon cancer Neg Hx    Esophageal cancer Neg Hx    Rectal cancer Neg Hx    Stomach cancer Neg Hx    Parkinson's disease Neg Hx     His Social History Is Significant For: Social History   Socioeconomic History   Marital status: Married    Spouse name: Danny [name]   Number of children: 3   Years of education: 16   Highest education level: Bachelor's degree (e.g., BA, AB, BS)  Occupational History   Occupation: Chief Financial Officer  Tobacco Use   Smoking status: Former    Packs/day: 1.50    Years: 15.00    Total pack years:  22.50    Types: Cigarettes    Quit date: 12/17/1982    Years since quitting: 39.7   Smokeless tobacco: Never   Tobacco comments:    over 40 yrs  Vaping Use   Vaping Use: Never used  Substance and Sexual Activity   Alcohol use: Not Currently   Drug use: No   Sexual activity: Yes    Partners: Female  Other Topics Concern   Not on file  Social History Narrative   Research officer, trade union, motorcycle rider.  He lives with his wife Danny [name]).  He has 3 adult children.  His wife had 2 children, one of whom is deceased.   Retired.    Patient has a Bachelor's degree.   Patient is right-handed.   Patient drinks 1 cups of coffee daily.   Social Determinants of Health   Financial Resource Strain: Low Risk  (01/17/2018)   Overall Financial Resource Strain (CARDIA)    Difficulty of Paying Living Expenses: Not hard at all  Food Insecurity: No Food Insecurity (01/17/2018)   Hunger Vital Sign    Worried About Running Out of Food in the Last Year: Never true    Ran Out of Food in the Last Year: Never true  Transportation Needs: No Transportation Needs (01/17/2018)   PRAPARE - Hydrologist (Medical): No    Lack of Transportation (Non-Medical): No  Physical Activity: Inactive (01/17/2018)   Exercise Vital Sign    Days of Exercise per Week: 0 days    Minutes of Exercise per Session: 0 min  Stress: No Stress Concern Present (01/17/2018)   [place] of Stress : Not at all  Social Connections: [place] (01/17/2018)   Social Connection and Isolation Panel [NHANES]    Frequency of Communication with Friends and Family: More than three times a week    Frequency of Social Gatherings with Friends and Family: More than three times a week    Attends Religious Services: More than 4 times per year    Active Member of [employer] or Organizations: Yes    Attends Archivist Meetings: More than 4 times per  year     Marital Status: Married    His Allergies Are:  Allergies  Allergen Reactions   Codeine Nausea And Vomiting  :   His Current Medications Are:  Outpatient Encounter Medications as of 09/20/2022  Medication Sig   ascorbic acid (VITAMIN C) 500 MG tablet Take 500 mg by mouth in the morning.   aspirin 81 MG chewable tablet Take 81 mg by mouth daily. Swallow whole.   carbidopa-levodopa (SINEMET CR) 50-200 MG tablet TAKE 1 TABLET BY MOUTH EVERYDAY AT BEDTIME   carbidopa-levodopa (SINEMET IR) 25-100 MG tablet TAKE 1 TABLET BY MOUTH 6 (SIX) TIMES DAILY. (Patient taking differently: Take 1 tablet by mouth at bedtime.)   Cinnamon 500 MG capsule Take 500 mg by mouth daily.   famotidine (PEPCID) 20 MG tablet Take 20 mg by mouth in the morning and at bedtime.    fludrocortisone (FLORINEF) 0.1 MG tablet Take 1 tablet (0.1 mg total) by mouth daily.   Glucosamine-Chondroitin (COSAMIN DS PO) Take 3 tablets by mouth in the morning.   melatonin 5 MG TABS Take 5 mg by mouth at bedtime as needed.   metFORMIN (GLUCOPHAGE) 1000 MG tablet Take 1 tablet (1,000 mg total) by mouth 2 (two) times daily with a meal.   Multiple Vitamin (MULTIVITAMIN) tablet Take 1 tablet by mouth daily with breakfast.   polyethylene glycol (MIRALAX / GLYCOLAX) 17 g packet Take 17 g by mouth every evening.   Probiotic Product (PROBIOTIC PO) Take 1 capsule by mouth in the morning and at bedtime.   QUEtiapine (SEROQUEL XR) 50 MG TB24 24 hr tablet Take 50 mg by mouth at bedtime.   simvastatin (ZOCOR) 20 MG tablet Take 20 mg by mouth every Monday, Wednesday, and Friday at 6 PM.   amLODipine (NORVASC) 10 MG tablet Take 1 tablet (10 mg total) by mouth daily. (Patient not taking: Reported on 09/20/2022)   [DISCONTINUED] Carbidopa-Levodopa ER (SINEMET CR) 25-100 MG tablet controlled release Take 1 tablet by mouth 5 (five) times daily.   No facility-administered encounter medications on file as of 09/20/2022.  :   Review of Systems:  Out  of a complete 14 point review of systems, all are reviewed and negative with the exception of these symptoms as listed below:  Review of Systems  Neurological:        Light Weight Wheelchair Eval  F2F for order ([facility]).  Form here.    Objective:  Neurological Exam  Physical Exam Physical Examination:   Vitals:   09/20/22 0956  BP: 133/76  Pulse: 68    General Examination: The patient is a very pleasant 71 y.o. male in no acute distress. He appears chronically ill and deconditioned, has lost weight, in his regular wheelchair currently.    HEENT: Normocephalic, atraumatic, pupils are equal, round and reactive to light, extraocular tracking is difficult for him, significant forward flexion of his neck is noted and he is leaning forward and to the right in the wheelchair. He wears prism eyeglasses.  Hearing is grossly intact.  Speech is hypophonic and mildly dysarthric, scant. Face is moderately masked, neck is moderately rigid. Airway examination is limited, mild mouth dryness.      Chest: Clear to auscultation without wheezing, rhonchi or crackles noted.   Heart: S1+S2+0, regular and normal without murmurs, rubs or gallops noted.    Abdomen: Soft, non-tender and non-distended.   Extremities: There is no pitting edema in the distal lower extremities bilaterally.    Skin: Warm and dry  without trophic changes noted.  But distal upper and lower extremities are colder to touch.   Musculoskeletal: exam reveals no new changes.     Neurologically:  Mental status: The patient is awake, alert and oriented in all 4 spheres. His immediate and remote memory, attention, language skills and fund of knowledge are impaired. His history is primarily provided by his wife. Speech as above. Thought process is linear. Mood is normal and affect is normal otherwise.    On 06/04/2019: MMSE: 25/30, CDT: 2/4, AFT: 14/min.   Cranial nerves II - XII are as described above under HEENT exam.  Motor exam:  Normal appearing bulk, global strength of 4 out of 5, increased tone particularly on the right side. Moderate to severe bradykinesia.  No obvious resting tremor, no obvious dyskinesias. No active hallucinations currently. Fine motor skills are severely impaired on the right and moderate to severely impaired on the left. Sensory exam is intact to light touch. Cerebellar testing shows no dysmetria or intention tremor. Gait, station and balance: He is in a wheelchair. I did not have him stand or walk for me today.       Assessment and plan:    In summary, SION REINDERS is a very pleasant 71 year old right-handed gentleman with an underlying medical history of type 2 diabetes, osteoarthritis, status post right knee replacement in March 2021, hypertension, obesity, hyperlipidemia, cataracts, cholelithiasis with status post right cholecystectomy on 07/02/2022, recurrent pancreatitis and recent diagnosis of pancreatic adenocarcinoma, who presents for follow-up consultation of his advanced Parkinson's disease, complicated by decline in mobility, knee pain on the R, history of IBD, memory loss, hallucinations, and recurrent falls. He presents for evaluation an ultra light-weight manual wheelchair.  I reviewed and agree with his physical therapy mobility and seating evaluation from 06/20/2022.  Patient's height is 5 8 and weight is 157.6 pounds.  The patient has had difficulty propelling in his regular wheelchair and would benefit from an ultralight weight manual wheelchair, which he would be able to self propel.  This would allow him to do better with his ADLs including cleaning, eating, and toileting.  I will order an ultralight weight manual wheelchair. He has been off of pramipexole.  We will continue with his current Parkinson's disease medication regimen.  He has an appointment pending next month which we mutually agreed to keep to reassess his condition.  He may have to start chemotherapy for his newly diagnosed  pancreatic cancer.  He has an appointment with oncology this afternoon.  I answered all the questions today and the patient and his wife are in agreement with our plan. I spent 30 minutes in total face-to-face time and in reviewing records during pre-charting, more than 50% of which was spent in counseling and coordination of care, reviewing test results, reviewing medications and treatment regimen and/or in discussing or reviewing the diagnosis of PD, the prognosis and treatment options. Pertinent laboratory and imaging test results that were available during this visit with the patient were reviewed by me and considered in my medical decision making (see chart for details).

## 2022-09-20 NOTE — Progress Notes (Signed)
I met with Danny [name] and his wife after  his consultation with Dr Burr Medico.  I explained my role as a nurse navigator and provided my contact information. I explained the services provided at [facility] and provided written information.  I explained the alight grant and let  them know one of the financial advisors will reach out to  them at the time of his chemo education class. I told them that he will be scheduled for chemotherapy education class prior to receiving chemotherapy.  I told them our schedulers will call them with those appts.  All questions were answered. They verbalized understanding.

## 2022-09-20 NOTE — Progress Notes (Signed)
[facility]   Telephone:([phone] [phone]) [place] Note   Patient Care Team: [name], [place] as PCP - General (Family Medicine) [place], Heather, [place] (Optometry) [name], MD as Attending Physician (Neurology)  Date of Service:  09/20/2022   CHIEF COMPLAINTS/PURPOSE OF CONSULTATION:  Newly Diagnosed Pancreatic Adenocarcinoma  REFERRING PHYSICIAN:  Dr. Rush Landmark  ASSESSMENT & PLAN:  Danny [name] is a 71 y.o.  male with a history of Parkinson's disease, diabetes, hypertension, presented with abdominal pain, fatigue, and weight loss.  1. Adenocarcinoma of Pancreatic body, cT2N0M0, stage IB -while undergoing work up for cholecystectomy, which was performed on 07/02/22, he was noted to have pancreatic duct abnormality on preoperative CT (07/01/22) and ductal enlargement in pancreas with enlargement of mesenteric lymph nodes on intraoperative cholangiogram. -Abdomen MRI on 08/11/22 showed: focal pancreatitis; discrete lesion in mid body of pancreas. -CA 19-9 obtained on 08/14/22 during hospitalization for abdominal pain showed significant elevation at 587. -EGD/EUS on 09/13/22 by Dr. Rush Landmark showed: mass-like area in genu/body of pancreas, and no malignant-appearing lymph nodes, staged as T2 N0. Cytology from pancreas body -I discussed the above work-up and findings with patient and his wife in detail. -I discussed his staging and treatment options.  He understands that surgery is the only curative treatment, however postoperative recurrence risk is still very high due to the aggressive nature of pancreatic cancer.  Due to his medical comorbidities, advanced age, overall poor health, patient and his wife has decided not to proceed with surgery, which is very reasonable. He has met pancreatobiliary surgeon Dr. Zenia Resides. -I discussed nonsurgical options, including palliative chemotherapy and radiation.  He is not a candidate for FOLFIRINOX, even for  gemcitabine and Abraxane.  I recommend him to consider single agent gemcitabine every 2 weeks for 3-4 months, followed by SBRT radiation consolidation.  Benefit and side effects discussed with patient and his wife in detail.  After a lengthy discussion, patient agrees to try chemo first.  --Chemotherapy consent: Side effects including but does not not limited to, fatigue, nausea, vomiting, diarrhea, hair loss, neuropathy, fluid retention, renal and kidney dysfunction, neutropenic fever, needed for blood transfusion, bleeding, were discussed with patient in great detail. She agrees to proceed. -The goal of chemotherapy is palliative, his life and prevent cancer related symptoms. -He has poor tolerance to chemotherapy, I will stop his chemo early and proceed with radiation. -We will also check MMR and FO on his biopsy sample if sample is adequate. -Genetic testing -plan to start chemo gemcitabine in 1-2 weeks, will hold port placement for now.   2.  Parkinson disease -Continue Sinemet follow-up with neurology  3.  Diabetes, hypertension, OA -continue meds and f/u with PCP  4.  Recent pancreatitis -We will follow-up clinically and repeat lipase if needed.  PLAN:  -Plan to start single agent chemotherapy gemcitabine every 2 weeks in 1 to 2 weeks -Chemo class -Urgent dietitian referral -lab today including genetic testing -I plan to see him with C2 gemcitabine  -tumor board discussion    Oncology History  Pancreatic cancer ([place])  07/01/2022 Imaging   EXAM: CT ANGIOGRAPHY CHEST CT ABDOMEN AND PELVIS WITH CONTRAST  IMPRESSION: 1. No evidence of pulmonary embolism or other acute cardiopulmonary process. 2. Mild-moderate gallbladder distension with diffuse gallbladder wall thickening and pericholecystic fluid/stranding, suggestive of acute cholecystitis. Further evaluation with right upper quadrant ultrasound is recommended. 3. Appearance of the pancreas suggests mild acute  uncomplicated pancreatitis. Correlate with serum lipase. There  is a short segment of mild pancreatic ductal dilatation within the pancreatic tail. 4. Slightly increased attenuation within the central mesentery with several small mesenteric lymph nodes, which can be seen in the setting of mesenteric panniculitis. 5. Moderate volume of stool within the colon. 6. Aortic and coronary artery atherosclerosis (ICD10-I70.0).   07/02/2022 Procedure   LAPAROSCOPIC CHOLECYSTECTOMY WITH INTRAOPERATIVE CHOLANGIOGRAM, Dr. Ninfa Linden   08/11/2022 Imaging   CLINICAL DATA:  Pancreatic mass  EXAM: MRI ABDOMEN WITHOUT AND WITH CONTRAST  IMPRESSION: 1. Findings suggest focal pancreatitis involving the mid pancreas as seen on comparison CTs. No organized fluid collections. 2. Discrete hypoenhancing lesion in the mid body the pancreas with upstream duct dilatation. While it is unusual for pancreatic adenocarcinoma to present as acute pancreatitis, this differential diagnosis warrants further evaluation. Recommend correlation with CA 19- 9 serum levels and if elevated consider endoscopic ultrasound for evaluation the mid body of the pancreas. If CA 19 -9 normal, recommend follow-up MRI with without contrast after acute pancreatitis subsided to re-evaluate hypoenhancing lesion in the mid body the pancreas. 3. No MRI evidence complication following cholecystectomy.   08/12/2022 Imaging   CLINICAL DATA:  Abdominal pain, acute, nonlocalized   EXAM: CT ABDOMEN AND PELVIS WITH CONTRAST  IMPRESSION: 1. Increase edema/inflammation around the pancreatic head and body, with contiguous inflammatory/edematous changes extending adjacent to the proximal duodenum, ascending colon and hepatic flexure, and into central mesentery suggesting worsening pancreatitis. 2. Fluid in the right pericolic gutter and pelvis, possibly related to pancreatitis. Bile leak from recent cholecystectomy is considered less likely  possibility. 3. Coronary and Aortic Atherosclerosis (ICD10-170.0).   08/14/2022 Tumor Marker   Patient's tumor was tested for the following markers: CA 19-9. Results of the tumor marker test revealed significant elevation: 587.   09/13/2022 Procedure   Upper EUS, Dr. Rush Landmark  Impression: EGD impression: - No gross lesions in esophagus proximally. - Z-line irregular, 33 cm from the incisors. Salmon-colored mucosa suspicious for long-segment Barrett's esophagus noted distally - biopsied. - 3 cm hiatal hernia. - Erythematous mucosa in the antrum. No gross lesions in the stomach. Biopsied. - No gross lesions in the duodenal bulb, in the first portion of the duodenum and in the second portion of the duodenum. - Normal major papilla.  EUS Impression: - A masslike area was identified in the genu/body of pancreas. Cytology results are pending. However, the endosonographic appearance is highly suspicious for adenocarcinoma. This was staged T2 N0 Mx by endosonographic criteria. The staging applies if malignancy is confirmed. Fine needle biopsy performed. Based on the imaging findings MRI there has been concern that this area was approximately 4 cm in size so T staging would change if that were to be true but I am more suspicious that the area is just a result of recent pancreatitis changes more than truly being a 4 cm mass. - There was no sign of significant pathology in the common bile duct and in the common hepatic duct. - No malignant-appearing lymph nodes were visualized in the celiac region (level 20), peripancreatic region and porta hepatis region.   09/13/2022 Initial Biopsy   A. PANCREAS, BODY, LESION, FINE NEEDLE ASPIRATION:   FINAL MICROSCOPIC DIAGNOSIS:  - Malignant cells consistent with adenocarcinoma    09/20/2022 Initial Diagnosis   Pancreatic cancer ([place])   09/27/2022 -  Chemotherapy   Patient is on Treatment Plan : PANCREAS Gemcitabine D1,8,15 (1000) q28d x 4 Cycles         HISTORY OF PRESENTING ILLNESS:  Okey Regal  Biegler 71 y.o. male is a here because of pancreatic cancer. The patient was referred by Dr. Rush Landmark. The patient presents to the clinic today accompanied by his wife. He came in a wheelchair.   He was diagnosed with Parkinson disease about 12 years ago.  His mobility has significantly decreased since right knee replacement 3 years ago.  He presented with sudden onset abdominal pain on July 02, 2022, was admitted to the hospital for acute cholecystitis and acute pancreatitis.  Underwent cholecystectomy by Dr. Ninfa Linden, was discharged home.  He was again in late August, for acute pancreatitis.  Abdominal MRI showed a hypodense lesion in the body of pancreas.  CA 19.9 with significant elevated.  He was referred to pancreatobiliary surgeon Dr. Zenia Resides for further evaluation.  He was not referred to GI Dr. Rush Landmark for EUS and biopsy of the pancreas mass last week, and cytology confirmed adenocarcinoma.  He was discharged to skilled nursing home after his last hospital stay a month ago, and was released home 3-4 days ago.  He has spent most time in wheelchair or bed, is only able to walk with a walker for very short distance, such as bedroom to bathroom.  His appetite has been low, he is quite fatigued, he has lost about 20 pounds in the past 6 months.  He denies any significant abdominal pain, no nausea or vomiting.  Slightly constipated, on MiraLAX.  No hematochezia or melena.  He has a PMHx of.... -Parkinson's officially diagnosed ~13 years ago -DM for more than 10 years   Socially... -he is a retired English as a second language teacher -married, lives with his wife, recently released from [place] rehab   [place]:    Constitutional: Denies fevers, chills or abnormal night sweats, (+) fatigue and weight loss  Eyes: Denies blurriness of vision, double vision or watery eyes Ears, nose, mouth, throat, and face: Denies mucositis or sore throat Respiratory: Denies cough,  dyspnea or wheezes Cardiovascular: Denies palpitation, chest discomfort or lower extremity swelling Gastrointestinal:  Denies nausea, heartburn or change in bowel habits Skin: Denies abnormal skin rashes Lymphatics: Denies new lymphadenopathy or easy bruising Neurological:Denies numbness, tingling or new weaknesses, (+) chronic low back pain Behavioral/Psych: Mood is stable, no new changes  All other systems were reviewed with the patient and are negative.   MEDICAL HISTORY:  Past Medical History:  Diagnosis Date   Allergy    SEASONAL   Cataract    BILATERAL-REMOVED   Essential hypertension, benign    PT.DENIES ON AS PREVENTIVE 08/17/19   GERD (gastroesophageal reflux disease)    Hx of adenomatous polyp of colon 09/04/2019   08/2019 diminutive adenoma No recall given findings and age   Mixed hyperlipidemia    PT.DENIES,STATED ON AS PREVENTIVE 07/30/19   Neuromuscular disorder (HCC)    PARKINSONS   OA (osteoarthritis) of knee    right   Obesity, unspecified    Osteoarthritis    KNEE AND SHOULDERS   Pancreatic cancer ([place]) 09/14/2022   diagnosed   Parkinsons    Type II or unspecified type diabetes mellitus without mention of complication, not stated as uncontrolled     SURGICAL HISTORY: Past Surgical History:  Procedure Laterality Date   BIOPSY  09/13/2022   Procedure: BIOPSY;  Surgeon: Irving Copas., MD;  Location: [facility];  Service: Gastroenterology;;   CATARACT EXTRACTION, BILATERAL     CHOLECYSTECTOMY N/A 07/02/2022   Procedure: LAPAROSCOPIC CHOLECYSTECTOMY WITH ICG DYE  INTRAOPERATIVE CHOLANGIOGRAM;  Surgeon: Coralie Keens, MD;  Location: [facility];  Service: General;  Laterality: N/A;   ESOPHAGOGASTRODUODENOSCOPY N/A 09/13/2022   Procedure: ESOPHAGOGASTRODUODENOSCOPY (EGD);  Surgeon: Irving Copas., MD;  Location: [facility];  Service: Gastroenterology;  Laterality: N/A;   EUS N/A 09/13/2022   Procedure: UPPER ENDOSCOPIC ULTRASOUND (EUS) RADIAL;   Surgeon: Rush Landmark Telford Nab., MD;  Location: [facility];  Service: Gastroenterology;  Laterality: N/A;   FINE NEEDLE ASPIRATION  09/13/2022   Procedure: FINE NEEDLE ASPIRATION (FNA) LINEAR;  Surgeon: Irving Copas., MD;  Location: [facility];  Service: Gastroenterology;;   GANGLION CYST EXCISION  age 62 years   right   KNEE ARTHROSCOPY     right   KNEE ARTHROSCOPY     TOTAL KNEE ARTHROPLASTY Right 03/08/2020   Procedure: RIGHT TOTAL KNEE ARTHROPLASTY;  Surgeon: Garald Balding, MD;  Location: [facility];  Service: Orthopedics;  Laterality: Right;   WRIST FRACTURE SURGERY     WRIST SURGERY  04/2012    SOCIAL HISTORY: Social History   Socioeconomic History   Marital status: Married    Spouse name: [name]   Number of children: 3   Years of education: 16   Highest education level: Bachelor's degree (e.g., BA, AB, BS)  Occupational History   Occupation: Chief Financial Officer  Tobacco Use   Smoking status: Former    Packs/day: 1.50    Years: 15.00    Total pack years: 22.50    Types: Cigarettes    Quit date: 12/17/1982    Years since quitting: 39.7   Smokeless tobacco: Never   Tobacco comments:    over 40 yrs  Vaping Use   Vaping Use: Never used  Substance and Sexual Activity   Alcohol use: Not Currently   Drug use: No   Sexual activity: Yes    Partners: Female  Other Topics Concern   Not on file  Social History Narrative   Research officer, trade union, motorcycle rider.  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  He has 3 adult children.  His wife had 2 children, one of whom is deceased.   Retired.    Patient has a Bachelor's degree.   Patient is right-handed.   Patient drinks 1 cups of coffee daily.   Social Determinants of Health   Financial Resource Strain: Low Risk  (01/17/2018)   Overall Financial Resource Strain (CARDIA)    Difficulty of Paying Living Expenses: Not hard at all  Food Insecurity: No Food Insecurity (01/17/2018)   Hunger Vital Sign    Worried About Running Out of Food in the  Last Year: Never true    Ran Out of Food in the Last Year: Never true  Transportation Needs: No Transportation Needs (01/17/2018)   PRAPARE - Hydrologist (Medical): No    Lack of Transportation (Non-Medical): No  Physical Activity: Inactive (01/17/2018)   Exercise Vital Sign    Days of Exercise per Week: 0 days    Minutes of Exercise per Session: 0 min  Stress: No Stress Concern Present (01/17/2018)   [place]    Feeling of Stress : Not at all  Social Connections: [place] (01/17/2018)   Social Connection and Isolation Panel [NHANES]    Frequency of Communication with Friends and Family: More than three times a week    Frequency of Social Gatherings with Friends and Family: More than three times a week    Attends Religious Services: More than 4 times per year    Active Member of Clubs or  Organizations: Yes    Attends Music therapist: More than 4 times per year    Marital Status: Married  Human resources officer Violence: Not At Risk (01/17/2018)   Humiliation, Afraid, Rape, and Kick questionnaire    Fear of Current or Ex-Partner: No    Emotionally Abused: No    Physically Abused: No    Sexually Abused: No    FAMILY HISTORY: Family History  Problem Relation Age of Onset   Diabetes Mother    Lung cancer Mother    Cancer Father        unknown type   Heart disease Father    Diabetes Sister    Cancer Maternal Uncle        gastric cancer   Diabetes Daughter    Colon polyps Son    Colon cancer Neg Hx    Esophageal cancer Neg Hx    Rectal cancer Neg Hx    Stomach cancer Neg Hx    Parkinson's disease Neg Hx     ALLERGIES:  is allergic to codeine.  MEDICATIONS:  Current Outpatient Medications  Medication Sig Dispense Refill   amLODipine (NORVASC) 10 MG tablet Take 1 tablet (10 mg total) by mouth daily. (Patient not taking: Reported on 09/20/2022) 30 tablet 0   ascorbic  acid (VITAMIN C) 500 MG tablet Take 500 mg by mouth in the morning.     aspirin 81 MG chewable tablet Take 81 mg by mouth daily. Swallow whole.     carbidopa-levodopa (SINEMET CR) 50-200 MG tablet TAKE 1 TABLET BY MOUTH EVERYDAY AT BEDTIME 90 tablet 3   carbidopa-levodopa (SINEMET IR) 25-100 MG tablet TAKE 1 TABLET BY MOUTH 6 (SIX) TIMES DAILY. (Patient taking differently: Take 1 tablet by mouth at bedtime.) 540 tablet 3   Cinnamon 500 MG capsule Take 500 mg by mouth daily.     famotidine (PEPCID) 20 MG tablet Take 20 mg by mouth in the morning and at bedtime.      fludrocortisone (FLORINEF) 0.1 MG tablet Take 1 tablet (0.1 mg total) by mouth daily. 30 tablet 5   Glucosamine-Chondroitin (COSAMIN DS PO) Take 3 tablets by mouth in the morning.     lidocaine-prilocaine (EMLA) cream Apply to affected area once 30 g 3   melatonin 5 MG TABS Take 5 mg by mouth at bedtime as needed.     metFORMIN (GLUCOPHAGE) 1000 MG tablet Take 1 tablet (1,000 mg total) by mouth 2 (two) times daily with a meal. 20 tablet 0   Multiple Vitamin (MULTIVITAMIN) tablet Take 1 tablet by mouth daily with breakfast.     ondansetron (ZOFRAN) 8 MG tablet Take 1 tablet (8 mg total) by mouth every 8 (eight) hours as needed for nausea or vomiting. 30 tablet 1   polyethylene glycol (MIRALAX / GLYCOLAX) 17 g packet Take 17 g by mouth every evening.     prochlorperazine (COMPAZINE) 10 MG tablet Take 1 tablet (10 mg total) by mouth every 6 (six) hours as needed for nausea or vomiting. 30 tablet 1   QUEtiapine (SEROQUEL XR) 50 MG TB24 24 hr tablet Take 50 mg by mouth at bedtime.     simvastatin (ZOCOR) 20 MG tablet Take 20 mg by mouth every Monday, Wednesday, and Friday at 6 PM.     No current facility-administered medications for this visit.    PHYSICAL EXAMINATION: ECOG PERFORMANCE STATUS: 3 - Symptomatic, >50% confined to bed  Vitals:   09/20/22 1451  BP: 94/65  Pulse: 92  Resp:  18  Temp: (!) 97.5 F (36.4 C)  SpO2: 100%    Filed Weights    GENERAL:alert, no distress and comfortable SKIN: skin color, texture, turgor are normal, no rashes or significant lesions EYES: normal, Conjunctiva are pink and non-injected, sclera clear NECK: supple, thyroid normal size, non-tender, without nodularity LYMPH:  no palpable lymphadenopathy in the cervical, axillary  LUNGS: clear to auscultation and percussion with normal breathing effort HEART: regular rate & rhythm and no murmurs and no lower extremity edema ABDOMEN:abdomen soft, non-tender and normal bowel sounds Musculoskeletal:no cyanosis of digits and no clubbing  NEURO: alert & oriented x 3 with fluent speech, no focal motor/sensory deficits  LABORATORY DATA:  I have reviewed the data as listed    Latest Ref Rng & Units 09/20/2022    4:13 PM 08/17/2022    8:26 AM 08/16/2022   10:00 AM  CBC  WBC 4.0 - 10.5 K/uL 8.7  13.4  13.4   Hemoglobin 13.0 - 17.0 g/dL 12.9  12.0  12.0   Hematocrit 39.0 - 52.0 % 38.0  35.7  35.1   Platelets 150 - 400 K/uL 208  226  199        Latest Ref Rng & Units 09/20/2022    4:13 PM 08/17/2022    8:26 AM 08/16/2022   10:00 AM  CMP  Glucose 70 - 99 mg/dL 180  168  199   BUN 8 - 23 mg/dL _0 Creatinine 0.61 - 1.24 mg/dL 0.93  0.70  0.71   Sodium 135 - 145 mmol/L 137  133  138   Potassium 3.5 - 5.1 mmol/L 4.4  3.1  3.3   Chloride 98 - 111 mmol/L 102  102  108   CO2 22 - 32 mmol/L _1 Calcium 8.9 - 10.3 mg/dL 9.2  7.9  8.2   Total Protein 6.5 - 8.1 g/dL 7.2     Total Bilirubin 0.3 - 1.2 mg/dL 0.9     Alkaline Phos 38 - 126 U/L 81     AST 15 - 41 U/L 12     ALT 0 - 44 U/L 8        RADIOGRAPHIC STUDIES: I have personally reviewed the radiological images as listed and agreed with the findings in the report. No results found.   Orders Placed This Encounter  Procedures   CBC with Differential ([facility] [place] Only)    Standing Status:   Future    Standing Expiration Date:   09/28/2023   CMP ([facility]  only)    Standing Status:   Future    Standing Expiration Date:   09/28/2023   CBC with Differential ([facility] Only)    Standing Status:   Future    Standing Expiration Date:   10/12/2023   CMP ([facility] only)    Standing Status:   Future    Standing Expiration Date:   10/12/2023   CBC with Differential/Platelet    Standing Status:   Standing    Number of Occurrences:   50    Standing Expiration Date:   09/21/2023   Cancer antigen 19-9    Standing Status:   Standing    Number of Occurrences:   20    Standing Expiration Date:   09/21/2023   Comprehensive metabolic panel    Standing Status:   Standing    Number of Occurrences:   50    Standing Expiration Date:  09/21/2023   Ambulatory Referral to [facility] Nutrition    Referral Priority:   Urgent    Referral Type:   Consultation    Referral Reason:   Specialty Services Required    Number of Visits Requested:   1    All questions were answered. The patient knows to call the clinic with any problems, questions or concerns. The total time spent in the appointment was 60 minutes.     Truitt Merle, MD 09/20/2022   I, Wilburn Mylar, am acting as scribe for Truitt Merle, MD.   I have reviewed the above documentation for accuracy and completeness, and I agree with the above.

## 2022-09-20 NOTE — Telephone Encounter (Signed)
-----   Message from Irving Copas., MD sent at 09/20/2022  4:47 PM EDT ----- Regarding: RE: New pancreatic cancer YF, I can get him on the list for Fiducials if necessary. Let me try and get a held slot for him. Thanks. GM  Daisja Kessinger, Just find an EUS slot for me and hold it tentatively.  No sooner than 3-weeks from now (which I already know is not available persea).  We can contact patient after discussion at [facility]. Thanks. GM ----- Message ----- From: Truitt Merle, MD Sent: 09/20/2022   3:42 PM EDT To: Kyung Rudd, MD; Dwan Bolt, MD; # Subject: RE: New pancreatic cancer                      Wilburn Cornelia,  I saw him and his wife today. He is very frail, not a candidate for intensive chemo. Pt and his wife has decided not to have surgery. I recommend him to try gemcitabine alone for 3 months if he is able to tolerate, then proceed with radiation, hopefully SBRT.   Will review his case in GI conference in 2 weeks, if he is a candidate for SBRT, then Chester Holstein can put him on schedule for fiducial placement.   He wants to hold on port placement for now, and see how first few cycle chemo goes, I will keep you posted.  Santiago Glad, please get genetic order in for lab next week on day of chemo class.   Thanks everyone,  Krista Blue  ----- Message ----- From: Dwan Bolt, MD Sent: 09/19/2022   9:57 AM EDT To: Truitt Merle, MD Subject: New pancreatic cancer                          Krista Blue, I saw this patient a few weeks ago for a pancreatic mass and referred him for EUS, which confirmed adenocarcinoma. He is seeing you tomorrow. He was admitted with pancreatitis in August, and when I saw him he was still in a nursing facility and quite deconditioned. He is home now but I think his overall performance status is poor. Since he had pancreatitis I would prefer neoadjuvant chemo for him, but I don't know if he is even recovered enough for chemo. If you need a port for him I am happy to place it. I don't know if he will  ever be a good candidate for a Whipple but I will plan to see him back after a few months of chemo to reassess. Thanks, [place]

## 2022-09-20 NOTE — Progress Notes (Signed)
START OFF PATHWAY REGIMEN - Pancreatic Adenocarcinoma   OFF00015:Gemcitabine 1,000 mg/m2 IV D1,8,15 q28 Days:   A cycle is every 28 days:     Gemcitabine   **Always confirm dose/schedule in your pharmacy ordering system**  Patient Characteristics: Preoperative, M0 (Clinical Staging), Resectable, Neoadjuvant Therapy Planned, BRCA1/2 and PALB2 Mutation Absent/Unknown Therapeutic Status: Preoperative, M0 (Clinical Staging) AJCC T Category: cT2 AJCC N Category: cN0 Resectability Status: Resectable AJCC M Category: cM0 AJCC 8 Stage Grouping: IB BRCA1/2 Mutation Status: Awaiting Test Results PALB2 Mutation Status: Awaiting Test Results Intent of Therapy: Non-Curative / Palliative Intent, Discussed with Patient

## 2022-09-21 ENCOUNTER — Other Ambulatory Visit: Payer: Self-pay

## 2022-09-21 DIAGNOSIS — C259 Malignant neoplasm of pancreas, unspecified: Secondary | ICD-10-CM

## 2022-09-21 DIAGNOSIS — C801 Malignant (primary) neoplasm, unspecified: Secondary | ICD-10-CM

## 2022-09-21 NOTE — Telephone Encounter (Signed)
EUS scheduled, pt instructed and medications reviewed.  Patient instructions mailed to home.  Patient to call with any questions or concerns.  

## 2022-09-21 NOTE — Telephone Encounter (Signed)
11/9 345 pm at [facility] with GM scheduled for EUS fiducial   Left message on machine to call back

## 2022-09-22 LAB — CANCER ANTIGEN 19-9: CA 19-9: 1183 U/mL — ABNORMAL HIGH (ref 0–35)

## 2022-09-23 ENCOUNTER — Other Ambulatory Visit: Payer: Self-pay

## 2022-09-24 ENCOUNTER — Encounter: Payer: Self-pay | Admitting: Hematology

## 2022-09-24 NOTE — Progress Notes (Signed)
I spoke with Danny [name] and relayed chemo education class appt on Thursday 10/12 at 1600 after his nutrition appt.  All questions were answered.  She verbalized understanding.

## 2022-09-27 ENCOUNTER — Inpatient Hospital Stay: Payer: Medicare Other | Admitting: Nutrition

## 2022-09-27 ENCOUNTER — Inpatient Hospital Stay: Payer: Medicare Other

## 2022-09-27 ENCOUNTER — Other Ambulatory Visit: Payer: Self-pay

## 2022-09-27 NOTE — Progress Notes (Signed)
71 year old male diagnosed with pancreas cancer and followed by Dr. Burr Medico.  Plan is for gemcitabine for 3 months and if that is tolerated radiation with SBRT.  Past medical history includes Parkinson's, diabetes, hypertension.  Medications include vitamin C, cinnamon, Pepcid, Glucophage, multivitamin, Zofran, Compazine, and MiraLAX.  Labs include glucose 180 on October 5  Height: 5 feet 10 inches. Weight: 157.6 pounds October 5. Usual body weight: 177 pounds August 27. BMI: 22.61. ECOG: 3.  Patient presented in a wheelchair and was unable to stand to get on the scale for a current weight.  He has lost 11% of his body weight in less than 3 months which is significant.  He reports early satiety and admits to decreased oral intake.  He denies nausea, vomiting, diarrhea, and constipation.  Dietary recall reveals patient eats Loss adjuster, chartered or cheese toast for breakfast.  He has half a banana sandwich with milk or a sloppy Joe sandwich with coleslaw for lunch. He eats beef stew or chicken stew with mashed potatoes and green beans for dinner.  He drinks milk and Coke Zero.  He loves cherry pie.  Nutrition diagnosis: Unintended weight loss related to inadequate oral intake as evidenced by 20 pound weight loss.  Intervention: Educated patient to consume small more frequent meals and snacks with adequate calories and protein. Reviewed appropriate high-calorie high-protein snacks. Recommended patient drink Ensure complete or equivalent twice daily.  I provided samples. Also provided samples of Carnation breakfast essentials that patient could drink with milk at mealtimes. Questions were answered.  Nutrition facts sheets given.  Contact information provided.  Monitoring, evaluation, goals: Patient will tolerate increased calories and protein to minimize weight loss.  Next visit: Thursday, October 26 during infusion with Vinnie Level.  **Disclaimer: This note was dictated with voice recognition software.  Similar sounding words can inadvertently be transcribed and this note may contain transcription errors which may not have been corrected upon publication of note.**

## 2022-09-28 ENCOUNTER — Inpatient Hospital Stay: Payer: Medicare Other

## 2022-09-28 ENCOUNTER — Encounter: Payer: Self-pay | Admitting: Hematology

## 2022-09-28 ENCOUNTER — Inpatient Hospital Stay (HOSPITAL_BASED_OUTPATIENT_CLINIC_OR_DEPARTMENT_OTHER): Payer: Medicare Other | Admitting: Hematology

## 2022-09-28 VITALS — BP 110/68 | HR 83 | Temp 97.5°F | Resp 16 | Wt 158.2 lb

## 2022-09-28 VITALS — BP 138/83 | HR 75 | Temp 98.2°F | Resp 17

## 2022-09-28 DIAGNOSIS — C251 Malignant neoplasm of body of pancreas: Secondary | ICD-10-CM

## 2022-09-28 DIAGNOSIS — Z5111 Encounter for antineoplastic chemotherapy: Secondary | ICD-10-CM | POA: Diagnosis not present

## 2022-09-28 LAB — CBC WITH DIFFERENTIAL (CANCER CENTER ONLY)
Abs Immature Granulocytes: 0.04 10*3/uL (ref 0.00–0.07)
Basophils Absolute: 0 10*3/uL (ref 0.0–0.1)
Basophils Relative: 1 %
Eosinophils Absolute: 0.2 10*3/uL (ref 0.0–0.5)
Eosinophils Relative: 3 %
HCT: 36.3 % — ABNORMAL LOW (ref 39.0–52.0)
Hemoglobin: 12.4 g/dL — ABNORMAL LOW (ref 13.0–17.0)
Immature Granulocytes: 1 %
Lymphocytes Relative: 20 %
Lymphs Abs: 1.5 10*3/uL (ref 0.7–4.0)
MCH: 31.1 pg (ref 26.0–34.0)
MCHC: 34.2 g/dL (ref 30.0–36.0)
MCV: 91 fL (ref 80.0–100.0)
Monocytes Absolute: 0.4 10*3/uL (ref 0.1–1.0)
Monocytes Relative: 6 %
Neutro Abs: 5.1 10*3/uL (ref 1.7–7.7)
Neutrophils Relative %: 69 %
Platelet Count: 220 10*3/uL (ref 150–400)
RBC: 3.99 MIL/uL — ABNORMAL LOW (ref 4.22–5.81)
RDW: 14.8 % (ref 11.5–15.5)
WBC Count: 7.4 10*3/uL (ref 4.0–10.5)
nRBC: 0 % (ref 0.0–0.2)

## 2022-09-28 LAB — CMP (CANCER CENTER ONLY)
ALT: 5 U/L (ref 0–44)
AST: 11 U/L — ABNORMAL LOW (ref 15–41)
Albumin: 3.8 g/dL (ref 3.5–5.0)
Alkaline Phosphatase: 76 U/L (ref 38–126)
Anion gap: 8 (ref 5–15)
BUN: 12 mg/dL (ref 8–23)
CO2: 28 mmol/L (ref 22–32)
Calcium: 8.9 mg/dL (ref 8.9–10.3)
Chloride: 103 mmol/L (ref 98–111)
Creatinine: 0.8 mg/dL (ref 0.61–1.24)
GFR, Estimated: >60 mL/min (ref >60)
Glucose, Bld: 182 mg/dL — ABNORMAL HIGH (ref 70–99)
Potassium: 3.8 mmol/L (ref 3.5–5.1)
Sodium: 139 mmol/L (ref 135–145)
Total Bilirubin: 0.8 mg/dL (ref 0.3–1.2)
Total Protein: 6.6 g/dL (ref 6.5–8.1)

## 2022-09-28 MED ORDER — SODIUM CHLORIDE 0.9 % IV SOLN
1000.0000 mg/m2 | Freq: Once | INTRAVENOUS | Status: AC
  Administered 2022-09-28: 1862 mg via INTRAVENOUS
  Filled 2022-09-28: qty 48.97

## 2022-09-28 MED ORDER — SODIUM CHLORIDE 0.9 % IV SOLN: Freq: Once | INTRAVENOUS | Status: AC

## 2022-09-28 MED ORDER — PROCHLORPERAZINE MALEATE 10 MG PO TABS
10.0000 mg | ORAL_TABLET | Freq: Once | ORAL | Status: AC
  Administered 2022-09-28: 10 mg via ORAL
  Filled 2022-09-28: qty 1

## 2022-09-28 NOTE — Patient Instructions (Signed)
[facility]  Discharge Instructions: Thank you for choosing [facility] to provide your oncology and hematology care.   If you have a lab appointment with the Cancer Center, please go directly to the Cancer Center and check in at the registration area.   Wear comfortable clothing and clothing appropriate for easy access to any Portacath or PICC line.   We strive to give you quality time with your provider. You may need to reschedule your appointment if you arrive late (15 or more minutes).  Arriving late affects you and other patients whose appointments are after yours.  Also, if you miss three or more appointments without notifying the office, you may be dismissed from the clinic at the provider's discretion.      For prescription refill requests, have your pharmacy contact our office and allow 72 hours for refills to be completed.    Today you received the following chemotherapy and/or immunotherapy agents Gemzar      To help prevent nausea and vomiting after your treatment, we encourage you to take your nausea medication as directed.  BELOW ARE SYMPTOMS THAT SHOULD BE REPORTED IMMEDIATELY: *FEVER GREATER THAN 100.4 F (38 C) OR HIGHER *CHILLS OR SWEATING *NAUSEA AND VOMITING THAT IS NOT CONTROLLED WITH YOUR NAUSEA MEDICATION *UNUSUAL SHORTNESS OF BREATH *UNUSUAL BRUISING OR BLEEDING *URINARY PROBLEMS (pain or burning when urinating, or frequent urination) *BOWEL PROBLEMS (unusual diarrhea, constipation, pain near the anus) TENDERNESS IN MOUTH AND THROAT WITH OR WITHOUT PRESENCE OF ULCERS (sore throat, sores in mouth, or a toothache) UNUSUAL RASH, SWELLING OR PAIN  UNUSUAL VAGINAL DISCHARGE OR ITCHING   Items with * indicate a potential emergency and should be followed up as soon as possible or go to the Emergency Department if any problems should occur.  Please show the CHEMOTHERAPY ALERT CARD or IMMUNOTHERAPY ALERT CARD at check-in to the  Emergency Department and triage nurse.  Should you have questions after your visit or need to cancel or reschedule your appointment, please contact [facility]  Dept: [phone]  and follow the prompts.  Office hours are 8:00 a.m. to 4:30 p.m. Monday - Friday. Please note that voicemails left after 4:00 p.m. may not be returned until the following business day.  We are closed weekends and major holidays. You have access to a nurse at all times for urgent questions. Please call the main number to the clinic Dept: [phone] and follow the prompts.   For any non-urgent questions, you may also contact your provider using MyChart. We now offer e-Visits for anyone 18 and older to request care online for non-urgent symptoms. For details visit [email or web address].   Also download the MyChart app! Go to the app store, search "MyChart", open the app, select [place], and log in with your MyChart username and password.  Masks are optional in the cancer centers. If you would like for your care team to wear a mask while they are taking care of you, please let them know. You may have one support person who is at least 71 years old accompany you for your appointments. Gemcitabine Injection What is this medication? GEMCITABINE (jem SYE ta been) treats some types of cancer. It works by slowing down the growth of cancer cells. This medicine may be used for other purposes; ask your health care provider or pharmacist if you have questions. COMMON BRAND NAME(S): Gemzar, Infugem What should I tell my care team before I take this medication?   They need to know if you have any of these conditions: Blood disorders Infection Kidney disease Liver disease Lung or breathing disease, such as asthma or COPD Recent or ongoing radiation therapy An unusual or allergic reaction to gemcitabine, other medications, foods, dyes, or preservatives If you or your partner are pregnant or trying  to get pregnant Breast-feeding How should I use this medication? This medication is injected into a vein. It is given by your care team in a hospital or clinic setting. Talk to your care team about the use of this medication in children. Special care may be needed. Overdosage: If you think you have taken too much of this medicine contact a poison control center or emergency room at once. NOTE: This medicine is only for you. Do not share this medicine with others. What if I miss a dose? Keep appointments for follow-up doses. It is important not to miss your dose. Call your care team if you are unable to keep an appointment. What may interact with this medication? Interactions have not been studied. This list may not describe all possible interactions. Give your health care provider a list of all the medicines, herbs, non-prescription drugs, or dietary supplements you use. Also tell them if you smoke, drink alcohol, or use illegal drugs. Some items may interact with your medicine. What should I watch for while using this medication? Your condition will be monitored carefully while you are receiving this medication. This medication may make you feel generally unwell. This is not uncommon, as chemotherapy can affect healthy cells as well as cancer cells. Report any side effects. Continue your course of treatment even though you feel ill unless your care team tells you to stop. In some cases, you may be given additional medications to help with side effects. Follow all directions for their use. This medication may increase your risk of getting an infection. Call your care team for advice if you get a fever, chills, sore throat, or other symptoms of a cold or flu. Do not treat yourself. Try to avoid being around people who are sick. This medication may increase your risk to bruise or bleed. Call your care team if you notice any unusual bleeding. Be careful brushing or flossing your teeth or using a  toothpick because you may get an infection or bleed more easily. If you have any dental work done, tell your dentist you are receiving this medication. Avoid taking medications that contain aspirin, acetaminophen, ibuprofen, naproxen, or ketoprofen unless instructed by your care team. These medications may hide a fever. Talk to your care team if you or your partner wish to become pregnant or think you might be pregnant. This medication can cause serious birth defects if taken during pregnancy and for 6 months after the last dose. A negative pregnancy test is required before starting this medication. A reliable form of contraception is recommended while taking this medication and for 6 months after the last dose. Talk to your care team about effective forms of contraception. Do not father a child while taking this medication and for 3 months after the last dose. Use a condom while having sex during this time period. Do not breastfeed while taking this medication and for at least 1 week after the last dose. This medication may cause infertility. Talk to your care team if you are concerned about your fertility. What side effects may I notice from receiving this medication? Side effects that you should report to your care team as soon as   possible: Allergic reactions--skin rash, itching, hives, swelling of the face, lips, tongue, or throat Capillary leak syndrome--stomach or muscle pain, unusual weakness or fatigue, feeling faint or lightheaded, decrease in the amount of urine, swelling of the ankles, hands, or feet, trouble breathing Infection--fever, chills, cough, sore throat, wounds that don't heal, pain or trouble when passing urine, general feeling of discomfort or being unwell Liver injury--right upper belly pain, loss of appetite, nausea, light-colored stool, dark yellow or brown urine, yellowing skin or eyes, unusual weakness or fatigue Low red blood cell level--unusual weakness or fatigue, dizziness,  headache, trouble breathing Lung injury--shortness of breath or trouble breathing, cough, spitting up blood, chest pain, fever Stomach pain, bloody diarrhea, pale skin, unusual weakness or fatigue, decrease in the amount of urine, which may be signs of hemolytic uremic syndrome Sudden and severe headache, confusion, change in vision, seizures, which may be signs of posterior reversible encephalopathy syndrome (PRES) Unusual bruising or bleeding Side effects that usually do not require medical attention (report to your care team if they continue or are bothersome): Diarrhea Drowsiness Hair loss Nausea Pain, redness, or swelling with sores inside the mouth or throat Vomiting This list may not describe all possible side effects. Call your doctor for medical advice about side effects. You may report side effects to FDA at 1-800-FDA-1088. Where should I keep my medication? This medication is given in a hospital or clinic. It will not be stored at home. NOTE: This sheet is a summary. It may not cover all possible information. If you have questions about this medicine, talk to your doctor, pharmacist, or health care provider.  2023 Elsevier/Gold Standard (2022-04-04 00:00:00)  

## 2022-09-28 NOTE — Progress Notes (Signed)
[place]   [phone] [phone]) [phone]   Clinic Follow up Note   Patient Care Team: [name], PA as PCP - General (Family Medicine) [place], Heather, Enoch (Optometry) [name], MD as Attending Physician (Neurology) Truitt Merle, MD as Consulting Physician (Hematology) Dwan Bolt, MD as Consulting Physician (General Surgery) Mansouraty, Telford Nab., MD as Consulting Physician (Gastroenterology)  Date of Service:  09/28/2022  CHIEF COMPLAINT: f/u of pancreatic cancer  CURRENT THERAPY:  Gemcitabine, q14d, starting 09/28/22  ASSESSMENT & PLAN:  Danny [name] is a 71 y.o. male with   1. Adenocarcinoma of Pancreatic body, cT2N0M0, stage IB -while undergoing work up for cholecystectomy, which was performed on 07/02/22, he was noted to have pancreatic duct abnormality on preoperative CT (07/01/22) and ductal enlargement in pancreas with enlargement of mesenteric lymph nodes on intraoperative cholangiogram. -Abdomen MRI on 08/11/22 showed: focal pancreatitis; discrete lesion in mid body of pancreas. -CA 19-9 obtained on 08/14/22 during hospitalization for abdominal pain showed significant elevation at 587. -EGD/EUS on 09/13/22 by Dr. Rush Landmark showed: mass-like area in genu/body of pancreas, and no malignant-appearing lymph nodes, staged as T2 N0. Cytology from pancreas body confirmed adenocarcinoma. -they opted not to pursue surgery due to comorbidities and overall poor PS -he is scheduled to begin single agent gemcitabine today, 09/28/22. We again reviewed potential AE's, and he agrees to proceed. Labs reviewed, overall stable, adequate to begin today. -Plan to have consolidation SBRT after 3 to 4 months chemotherapy if no disease progression.   2.  Parkinson disease -Continue Sinemet follow-up with neurology   3.  Diabetes, hypertension, OA -continue meds and f/u with PCP   4.  Recent pancreatitis -We will follow-up clinically and repeat  lipase if needed.    PLAN:  -proceed with gemcitabine today and every 2 weeks -lab, f/u, and gemcitabine every 2 weeks, if he tolerates the first cycle of chemotherapy well, plan to postpone his fiducial placement -nutrition to see him during next cycle   No problem-specific Assessment & Plan notes found for this encounter.   SUMMARY OF ONCOLOGIC HISTORY: Oncology History  Pancreatic cancer ([place])  07/01/2022 Imaging   EXAM: CT ANGIOGRAPHY CHEST CT ABDOMEN AND PELVIS WITH CONTRAST  IMPRESSION: 1. No evidence of pulmonary embolism or other acute cardiopulmonary process. 2. Mild-moderate gallbladder distension with diffuse gallbladder wall thickening and pericholecystic fluid/stranding, suggestive of acute cholecystitis. Further evaluation with right upper quadrant ultrasound is recommended. 3. Appearance of the pancreas suggests mild acute uncomplicated pancreatitis. Correlate with serum lipase. There is a short segment of mild pancreatic ductal dilatation within the pancreatic tail. 4. Slightly increased attenuation within the central mesentery with several small mesenteric lymph nodes, which can be seen in the setting of mesenteric panniculitis. 5. Moderate volume of stool within the colon. 6. Aortic and coronary artery atherosclerosis (ICD10-I70.0).   07/02/2022 Procedure   LAPAROSCOPIC CHOLECYSTECTOMY WITH INTRAOPERATIVE CHOLANGIOGRAM, Dr. Ninfa Linden   08/11/2022 Imaging   CLINICAL DATA:  Pancreatic mass  EXAM: MRI ABDOMEN WITHOUT AND WITH CONTRAST  IMPRESSION: 1. Findings suggest focal pancreatitis involving the mid pancreas as seen on comparison CTs. No organized fluid collections. 2. Discrete hypoenhancing lesion in the mid body the pancreas with upstream duct dilatation. While it is unusual for pancreatic adenocarcinoma to present as acute pancreatitis, this differential diagnosis warrants further evaluation. Recommend correlation with CA 19- 9 serum levels and if  elevated consider endoscopic ultrasound for evaluation the mid body of the pancreas. If CA 19 -9 normal, recommend follow-up  MRI with without contrast after acute pancreatitis subsided to re-evaluate hypoenhancing lesion in the mid body the pancreas. 3. No MRI evidence complication following cholecystectomy.   08/12/2022 Imaging   CLINICAL DATA:  Abdominal pain, acute, nonlocalized   EXAM: CT ABDOMEN AND PELVIS WITH CONTRAST  IMPRESSION: 1. Increase edema/inflammation around the pancreatic head and body, with contiguous inflammatory/edematous changes extending adjacent to the proximal duodenum, ascending colon and hepatic flexure, and into central mesentery suggesting worsening pancreatitis. 2. Fluid in the right pericolic gutter and pelvis, possibly related to pancreatitis. Bile leak from recent cholecystectomy is considered less likely possibility. 3. Coronary and Aortic Atherosclerosis (ICD10-170.0).   08/14/2022 Tumor Marker   Patient's tumor was tested for the following markers: CA 19-9. Results of the tumor marker test revealed significant elevation: 587.   09/13/2022 Procedure   Upper EUS, Dr. Rush Landmark  Impression: EGD impression: - No gross lesions in esophagus proximally. - Z-line irregular, 33 cm from the incisors. Salmon-colored mucosa suspicious for long-segment Barrett's esophagus noted distally - biopsied. - 3 cm hiatal hernia. - Erythematous mucosa in the antrum. No gross lesions in the stomach. Biopsied. - No gross lesions in the duodenal bulb, in the first portion of the duodenum and in the second portion of the duodenum. - Normal major papilla.  EUS Impression: - A masslike area was identified in the genu/body of pancreas. Cytology results are pending. However, the endosonographic appearance is highly suspicious for adenocarcinoma. This was staged T2 N0 Mx by endosonographic criteria. The staging applies if malignancy is confirmed. Fine needle biopsy performed.  Based on the imaging findings MRI there has been concern that this area was approximately 4 cm in size so T staging would change if that were to be true but I am more suspicious that the area is just a result of recent pancreatitis changes more than truly being a 4 cm mass. - There was no sign of significant pathology in the common bile duct and in the common hepatic duct. - No malignant-appearing lymph nodes were visualized in the celiac region (level 20), peripancreatic region and porta hepatis region.   09/13/2022 Initial Biopsy   A. PANCREAS, BODY, LESION, FINE NEEDLE ASPIRATION:   FINAL MICROSCOPIC DIAGNOSIS:  - Malignant cells consistent with adenocarcinoma    09/13/2022 Cancer Staging   Staging form: Exocrine Pancreas, AJCC 8th Edition - Clinical stage from 09/13/2022: Stage IB (cT2, cN0, cM0) - Signed by Truitt Merle, MD on 09/20/2022 Stage prefix: Initial diagnosis Total positive nodes: 0   09/20/2022 Initial Diagnosis   Pancreatic cancer ([place])   09/28/2022 -  Chemotherapy   Patient is on Treatment Plan : PANCREAS Gemcitabine D1,8,15 (1000) q28d x 4 Cycles        INTERVAL HISTORY:  Danny [name] is here for a follow up of pancreatic cancer. He was last seen by me on 09/20/22 in consultation. He presents to the clinic accompanied by his wife. They report he is doing well overall, denies any new concerns.   All other systems were reviewed with the patient and are negative.  MEDICAL HISTORY:  Past Medical History:  Diagnosis Date   Allergy    SEASONAL   Cataract    BILATERAL-REMOVED   Essential hypertension, benign    PT.DENIES ON AS PREVENTIVE 08/17/19   GERD (gastroesophageal reflux disease)    Hx of adenomatous polyp of colon 09/04/2019   08/2019 diminutive adenoma No recall given findings and age   Mixed hyperlipidemia    PT.DENIES,STATED ON AS PREVENTIVE  07/30/19   Neuromuscular disorder (HCC)    PARKINSONS   OA (osteoarthritis) of knee    right   Obesity,  unspecified    Osteoarthritis    KNEE AND SHOULDERS   Pancreatic cancer ([place]) 09/14/2022   diagnosed   Parkinsons    Type II or unspecified type diabetes mellitus without mention of complication, not stated as uncontrolled     SURGICAL HISTORY: Past Surgical History:  Procedure Laterality Date   BIOPSY  09/13/2022   Procedure: BIOPSY;  Surgeon: Irving Copas., MD;  Location: [facility];  Service: Gastroenterology;;   CATARACT EXTRACTION, BILATERAL     CHOLECYSTECTOMY N/A 07/02/2022   Procedure: LAPAROSCOPIC CHOLECYSTECTOMY WITH ICG DYE  INTRAOPERATIVE CHOLANGIOGRAM;  Surgeon: Coralie Keens, MD;  Location: [facility];  Service: General;  Laterality: N/A;   ESOPHAGOGASTRODUODENOSCOPY N/A 09/13/2022   Procedure: ESOPHAGOGASTRODUODENOSCOPY (EGD);  Surgeon: Irving Copas., MD;  Location: [facility];  Service: Gastroenterology;  Laterality: N/A;   EUS N/A 09/13/2022   Procedure: UPPER ENDOSCOPIC ULTRASOUND (EUS) RADIAL;  Surgeon: Rush Landmark Telford Nab., MD;  Location: [facility];  Service: Gastroenterology;  Laterality: N/A;   FINE NEEDLE ASPIRATION  09/13/2022   Procedure: FINE NEEDLE ASPIRATION (FNA) LINEAR;  Surgeon: Irving Copas., MD;  Location: [facility];  Service: Gastroenterology;;   GANGLION CYST EXCISION  age 48 years   right   KNEE ARTHROSCOPY     right   KNEE ARTHROSCOPY     TOTAL KNEE ARTHROPLASTY Right 03/08/2020   Procedure: RIGHT TOTAL KNEE ARTHROPLASTY;  Surgeon: Garald Balding, MD;  Location: [facility];  Service: Orthopedics;  Laterality: Right;   WRIST FRACTURE SURGERY     WRIST SURGERY  04/2012    I have reviewed the social history and family history with the patient and they are unchanged from previous note.  ALLERGIES:  is allergic to codeine.  MEDICATIONS:  Current Outpatient Medications  Medication Sig Dispense Refill   amLODipine (NORVASC) 10 MG tablet Take 1 tablet (10 mg total) by mouth daily. (Patient not taking: Reported  on 09/20/2022) 30 tablet 0   ascorbic acid (VITAMIN C) 500 MG tablet Take 500 mg by mouth in the morning.     aspirin 81 MG chewable tablet Take 81 mg by mouth daily. Swallow whole.     carbidopa-levodopa (SINEMET CR) 50-200 MG tablet TAKE 1 TABLET BY MOUTH EVERYDAY AT BEDTIME 90 tablet 3   carbidopa-levodopa (SINEMET IR) 25-100 MG tablet TAKE 1 TABLET BY MOUTH 6 (SIX) TIMES DAILY. (Patient taking differently: Take 1 tablet by mouth at bedtime.) 540 tablet 3   Cinnamon 500 MG capsule Take 500 mg by mouth daily.     famotidine (PEPCID) 20 MG tablet Take 20 mg by mouth in the morning and at bedtime.      fludrocortisone (FLORINEF) 0.1 MG tablet Take 1 tablet (0.1 mg total) by mouth daily. 30 tablet 5   Glucosamine-Chondroitin (COSAMIN DS PO) Take 1 tablet by mouth in the morning.     lidocaine-prilocaine (EMLA) cream Apply to affected area once 30 g 3   metFORMIN (GLUCOPHAGE) 1000 MG tablet Take 1 tablet (1,000 mg total) by mouth 2 (two) times daily with a meal. 20 tablet 0   Multiple Vitamin (MULTIVITAMIN) tablet Take 1 tablet by mouth daily with breakfast.     ondansetron (ZOFRAN) 8 MG tablet Take 1 tablet (8 mg total) by mouth every 8 (eight) hours as needed for nausea or vomiting. 30 tablet 1   polyethylene glycol (MIRALAX /  GLYCOLAX) 17 g packet Take 17 g by mouth every evening.     prochlorperazine (COMPAZINE) 10 MG tablet Take 1 tablet (10 mg total) by mouth every 6 (six) hours as needed for nausea or vomiting. 30 tablet 1   QUEtiapine (SEROQUEL XR) 50 MG TB24 24 hr tablet Take 50 mg by mouth at bedtime.     simvastatin (ZOCOR) 20 MG tablet Take 20 mg by mouth every Monday, Wednesday, and Friday at 6 PM.     No current facility-administered medications for this visit.    PHYSICAL EXAMINATION: ECOG PERFORMANCE STATUS: 3 - Symptomatic, >50% confined to bed  Vitals:   09/28/22 1204  BP: 110/68  Pulse: 83  Resp: 16  Temp: (!) 97.5 F (36.4 C)  SpO2: 100%   Wt Readings from Last 3  Encounters:  09/28/22 158 lb 3.2 oz (71.8 kg)  09/20/22 157 lb 9.6 oz (71.5 kg)  09/13/22 160 lb 15 oz (73 kg)     GENERAL:alert, no distress and comfortable SKIN: skin color normal, no rashes or significant lesions EYES: normal, Conjunctiva are pink and non-injected, sclera clear  NEURO: alert & oriented x 3 with fluent speech  LABORATORY DATA:  I have reviewed the data as listed    Latest Ref Rng & Units 09/28/2022   11:50 AM 09/20/2022    4:13 PM 08/17/2022    8:26 AM  CBC  WBC 4.0 - 10.5 K/uL 7.4  8.7  13.4   Hemoglobin 13.0 - 17.0 g/dL 12.4  12.9  12.0   Hematocrit 39.0 - 52.0 % 36.3  38.0  35.7   Platelets 150 - 400 K/uL 220  208  226         Latest Ref Rng & Units 09/28/2022   11:50 AM 09/20/2022    4:13 PM 08/17/2022    8:26 AM  CMP  Glucose 70 - 99 mg/dL 182  180  168   BUN 8 - 23 mg/dL '12  21  12   '$ Creatinine 0.61 - 1.24 mg/dL 0.80  0.93  0.70   Sodium 135 - 145 mmol/L 139  137  133   Potassium 3.5 - 5.1 mmol/L 3.8  4.4  3.1   Chloride 98 - 111 mmol/L 103  102  102   CO2 22 - 32 mmol/L '28  28  27   '$ Calcium 8.9 - 10.3 mg/dL 8.9  9.2  7.9   Total Protein 6.5 - 8.1 g/dL 6.6  7.2    Total Bilirubin 0.3 - 1.2 mg/dL 0.8  0.9    Alkaline Phos 38 - 126 U/L 76  81    AST 15 - 41 U/L 11  12    ALT 0 - 44 U/L 5  8        RADIOGRAPHIC STUDIES: I have personally reviewed the radiological images as listed and agreed with the findings in the report. No results found.    Orders Placed This Encounter  Procedures   CBC with Differential ([facility] Only)    Standing Status:   Future    Standing Expiration Date:   10/26/2023   CMP ([facility] only)    Standing Status:   Future    Standing Expiration Date:   10/26/2023   CBC with Differential ([facility] Only)    Standing Status:   Future    Standing Expiration Date:   11/09/2023   CMP ([place] [facility] only)    Standing Status:   Future    Standing  Expiration Date:   11/09/2023   All questions were answered.  The patient knows to call the clinic with any problems, questions or concerns. No barriers to learning was detected. The total time spent in the appointment was 30 minutes.     Truitt Merle, MD 09/28/2022   I, Wilburn Mylar, am acting as scribe for Truitt Merle, MD.   I have reviewed the above documentation for accuracy and completeness, and I agree with the above.

## 2022-10-04 ENCOUNTER — Telehealth: Payer: Self-pay | Admitting: Genetic Counselor

## 2022-10-04 ENCOUNTER — Encounter: Payer: Self-pay | Admitting: Genetic Counselor

## 2022-10-04 DIAGNOSIS — Z1379 Encounter for other screening for genetic and chromosomal anomalies: Secondary | ICD-10-CM | POA: Insufficient documentation

## 2022-10-04 NOTE — Telephone Encounter (Signed)
[facility] CancerNext-Expanded Panel was ordered on 09/20/2022. No pathogenic variants were identified in the 77 genes analyzed.   The test report has been scanned into EPIC and is located under the Molecular Pathology section of the Results Review tab.  A portion of the result report is included below for reference.   Lucille Passy, MS, [facility] Genetic Counselor [email or web address] (P) [phone]

## 2022-10-10 ENCOUNTER — Telehealth: Payer: Self-pay

## 2022-10-10 ENCOUNTER — Other Ambulatory Visit: Payer: Self-pay

## 2022-10-10 NOTE — Telephone Encounter (Signed)
-----   Message from Irving Copas., MD sent at 10/10/2022  1:13 PM EDT ----- Regarding: Follow-up need for fiducials Danny [name], This patient will need an EUS with fiducial placement by the middle of January. Please place a recall in the system or have him scheduled at the end of December. Thanks. GM

## 2022-10-10 NOTE — Progress Notes (Signed)
The proposed treatment discussed in conference is for discussion purpose only and is not a binding recommendation.  The patients have not been physically examined, or presented with their treatment options.  Therefore, final treatment plans cannot be decided.  

## 2022-10-11 ENCOUNTER — Inpatient Hospital Stay: Payer: Medicare Other

## 2022-10-11 ENCOUNTER — Other Ambulatory Visit: Payer: Self-pay

## 2022-10-11 ENCOUNTER — Inpatient Hospital Stay: Payer: Medicare Other | Admitting: Dietician

## 2022-10-11 ENCOUNTER — Inpatient Hospital Stay (HOSPITAL_BASED_OUTPATIENT_CLINIC_OR_DEPARTMENT_OTHER): Payer: Medicare Other | Admitting: Hematology

## 2022-10-11 ENCOUNTER — Encounter: Payer: Self-pay | Admitting: Hematology

## 2022-10-11 VITALS — BP 124/82 | HR 72 | Temp 99.0°F | Resp 17 | Wt 143.5 lb

## 2022-10-11 DIAGNOSIS — Z5111 Encounter for antineoplastic chemotherapy: Secondary | ICD-10-CM | POA: Diagnosis not present

## 2022-10-11 DIAGNOSIS — C251 Malignant neoplasm of body of pancreas: Secondary | ICD-10-CM

## 2022-10-11 DIAGNOSIS — K869 Disease of pancreas, unspecified: Secondary | ICD-10-CM

## 2022-10-11 LAB — CMP (CANCER CENTER ONLY)
ALT: 7 U/L (ref 0–44)
AST: 12 U/L — ABNORMAL LOW (ref 15–41)
Albumin: 3.8 g/dL (ref 3.5–5.0)
Alkaline Phosphatase: 72 U/L (ref 38–126)
Anion gap: 8 (ref 5–15)
BUN: 16 mg/dL (ref 8–23)
CO2: 25 mmol/L (ref 22–32)
Calcium: 9.3 mg/dL (ref 8.9–10.3)
Chloride: 104 mmol/L (ref 98–111)
Creatinine: 0.78 mg/dL (ref 0.61–1.24)
GFR, Estimated: >60 mL/min (ref >60)
Glucose, Bld: 123 mg/dL — ABNORMAL HIGH (ref 70–99)
Potassium: 4.5 mmol/L (ref 3.5–5.1)
Sodium: 137 mmol/L (ref 135–145)
Total Bilirubin: 0.6 mg/dL (ref 0.3–1.2)
Total Protein: 6.6 g/dL (ref 6.5–8.1)

## 2022-10-11 LAB — CBC WITH DIFFERENTIAL (CANCER CENTER ONLY)
Abs Immature Granulocytes: 0.02 10*3/uL (ref 0.00–0.07)
Basophils Absolute: 0 10*3/uL (ref 0.0–0.1)
Basophils Relative: 1 %
Eosinophils Absolute: 0.1 10*3/uL (ref 0.0–0.5)
Eosinophils Relative: 2 %
HCT: 35.7 % — ABNORMAL LOW (ref 39.0–52.0)
Hemoglobin: 12.2 g/dL — ABNORMAL LOW (ref 13.0–17.0)
Immature Granulocytes: 0 %
Lymphocytes Relative: 27 %
Lymphs Abs: 1.9 10*3/uL (ref 0.7–4.0)
MCH: 31.5 pg (ref 26.0–34.0)
MCHC: 34.2 g/dL (ref 30.0–36.0)
MCV: 92.2 fL (ref 80.0–100.0)
Monocytes Absolute: 0.5 10*3/uL (ref 0.1–1.0)
Monocytes Relative: 7 %
Neutro Abs: 4.4 10*3/uL (ref 1.7–7.7)
Neutrophils Relative %: 63 %
Platelet Count: 164 10*3/uL (ref 150–400)
RBC: 3.87 MIL/uL — ABNORMAL LOW (ref 4.22–5.81)
RDW: 15.5 % (ref 11.5–15.5)
WBC Count: 7 10*3/uL (ref 4.0–10.5)
nRBC: 0 % (ref 0.0–0.2)

## 2022-10-11 MED ORDER — SODIUM CHLORIDE 0.9 % IV SOLN: Freq: Once | INTRAVENOUS | Status: AC

## 2022-10-11 MED ORDER — PROCHLORPERAZINE MALEATE 10 MG PO TABS
10.0000 mg | ORAL_TABLET | Freq: Once | ORAL | Status: AC
  Administered 2022-10-11: 10 mg via ORAL
  Filled 2022-10-11: qty 1

## 2022-10-11 MED ORDER — SODIUM CHLORIDE 0.9 % IV SOLN: Freq: Once | INTRAVENOUS | Status: DC

## 2022-10-11 MED ORDER — SODIUM CHLORIDE 0.9 % IV SOLN
1000.0000 mg/m2 | Freq: Once | INTRAVENOUS | Status: AC
  Administered 2022-10-11: 1862 mg via INTRAVENOUS
  Filled 2022-10-11: qty 48.97

## 2022-10-11 NOTE — Progress Notes (Signed)
[name]   [phone] [phone]   Clinic Follow up Note   Patient Care Team: [name], PA as PCP - General (Family Medicine) [place], Heather, [place] (Optometry) [name], MD as Attending Physician (Neurology) Truitt Merle, MD as Consulting Physician (Hematology) Dwan Bolt, MD as Consulting Physician (General Surgery) Mansouraty, Telford Nab., MD as Consulting Physician (Gastroenterology)  Date of Service:  10/11/2022  CHIEF COMPLAINT: f/u of pancreatic cancer  CURRENT THERAPY:  Gemcitabine, q14d, starting 09/28/22  ASSESSMENT & PLAN:  Danny [name] is a 71 y.o. male with   1. Adenocarcinoma of Pancreatic body, cT2N0M0, stage IB -while undergoing work up for cholecystectomy, which was performed on 07/02/22, he was noted to have pancreatic duct abnormality on preoperative CT (07/01/22) and ductal enlargement in pancreas with enlargement of mesenteric lymph nodes on intraoperative cholangiogram. -Abdomen MRI on 08/11/22 showed: focal pancreatitis; discrete lesion in mid body of pancreas. -baseline CA 19-9 on 08/14/22 significantly elevated at 587. -EGD/EUS on 09/13/22 by Dr. Rush Landmark showed: mass-like area in genu/body of pancreas, and no malignant-appearing lymph nodes, staged as T2 N0. Cytology from pancreas body confirmed adenocarcinoma. -they opted not to pursue surgery due to comorbidities and overall poor PS -repeat CA 19-9 on 09/20/22 rose to 1,183. -he began single agent gemcitabine on 09/28/22. He tolerated well with no noticeable side effects. We discussed the option of increasing to two weeks on, one week off. Will continue every 2 weeks for now. -Labs reviewed, overall stable, adequate to proceed today. -Plan to have consolidation SBRT after 3 to 4 months chemotherapy if no disease progression.   2.  Comorbidities: Parkinson disease, DM, HTN, OA -Continue Sinemet follow-up with neurology -continue meds and f/u with PCP   3.   Recent pancreatitis -We will follow-up clinically and repeat lipase if needed.   4. Genetics -Genetic testing was negative, I gave him a copy of the report    PLAN:  -proceed with gemcitabine today and every 2 weeks -lab, f/u, and gemcitabine every 2 weeks   No problem-specific Assessment & Plan notes found for this encounter.   SUMMARY OF ONCOLOGIC HISTORY: Oncology History  Pancreatic cancer ([place])  07/01/2022 Imaging   EXAM: CT ANGIOGRAPHY CHEST CT ABDOMEN AND PELVIS WITH CONTRAST  IMPRESSION: 1. No evidence of pulmonary embolism or other acute cardiopulmonary process. 2. Mild-moderate gallbladder distension with diffuse gallbladder wall thickening and pericholecystic fluid/stranding, suggestive of acute cholecystitis. Further evaluation with right upper quadrant ultrasound is recommended. 3. Appearance of the pancreas suggests mild acute uncomplicated pancreatitis. Correlate with serum lipase. There is a short segment of mild pancreatic ductal dilatation within the pancreatic tail. 4. Slightly increased attenuation within the central mesentery with several small mesenteric lymph nodes, which can be seen in the setting of mesenteric panniculitis. 5. Moderate volume of stool within the colon. 6. Aortic and coronary artery atherosclerosis (ICD10-I70.0).   07/02/2022 Procedure   LAPAROSCOPIC CHOLECYSTECTOMY WITH INTRAOPERATIVE CHOLANGIOGRAM, Dr. Ninfa Linden   08/11/2022 Imaging   CLINICAL DATA:  Pancreatic mass  EXAM: MRI ABDOMEN WITHOUT AND WITH CONTRAST  IMPRESSION: 1. Findings suggest focal pancreatitis involving the mid pancreas as seen on comparison CTs. No organized fluid collections. 2. Discrete hypoenhancing lesion in the mid body the pancreas with upstream duct dilatation. While it is unusual for pancreatic adenocarcinoma to present as acute pancreatitis, this differential diagnosis warrants further evaluation. Recommend correlation with CA 19- 9 serum levels and  if elevated consider endoscopic ultrasound for evaluation the mid body of the  pancreas. If CA 19 -9 normal, recommend follow-up MRI with without contrast after acute pancreatitis subsided to re-evaluate hypoenhancing lesion in the mid body the pancreas. 3. No MRI evidence complication following cholecystectomy.   08/12/2022 Imaging   CLINICAL DATA:  Abdominal pain, acute, nonlocalized   EXAM: CT ABDOMEN AND PELVIS WITH CONTRAST  IMPRESSION: 1. Increase edema/inflammation around the pancreatic head and body, with contiguous inflammatory/edematous changes extending adjacent to the proximal duodenum, ascending colon and hepatic flexure, and into central mesentery suggesting worsening pancreatitis. 2. Fluid in the right pericolic gutter and pelvis, possibly related to pancreatitis. Bile leak from recent cholecystectomy is considered less likely possibility. 3. Coronary and Aortic Atherosclerosis (ICD10-170.0).   08/14/2022 Tumor Marker   Patient's tumor was tested for the following markers: CA 19-9. Results of the tumor marker test revealed significant elevation: 587.   09/13/2022 Procedure   Upper EUS, Dr. Rush Landmark  Impression: EGD impression: - No gross lesions in esophagus proximally. - Z-line irregular, 33 cm from the incisors. Salmon-colored mucosa suspicious for long-segment Barrett's esophagus noted distally - biopsied. - 3 cm hiatal hernia. - Erythematous mucosa in the antrum. No gross lesions in the stomach. Biopsied. - No gross lesions in the duodenal bulb, in the first portion of the duodenum and in the second portion of the duodenum. - Normal major papilla.  EUS Impression: - A masslike area was identified in the genu/body of pancreas. Cytology results are pending. However, the endosonographic appearance is highly suspicious for adenocarcinoma. This was staged T2 N0 Mx by endosonographic criteria. The staging applies if malignancy is confirmed. Fine needle biopsy performed.  Based on the imaging findings MRI there has been concern that this area was approximately 4 cm in size so T staging would change if that were to be true but I am more suspicious that the area is just a result of recent pancreatitis changes more than truly being a 4 cm mass. - There was no sign of significant pathology in the common bile duct and in the common hepatic duct. - No malignant-appearing lymph nodes were visualized in the celiac region (level 20), peripancreatic region and porta hepatis region.   09/13/2022 Initial Biopsy   A. PANCREAS, BODY, LESION, FINE NEEDLE ASPIRATION:   FINAL MICROSCOPIC DIAGNOSIS:  - Malignant cells consistent with adenocarcinoma    09/13/2022 Cancer Staging   Staging form: Exocrine Pancreas, AJCC 8th Edition - Clinical stage from 09/13/2022: Stage IB (cT2, cN0, cM0) - Signed by Truitt Merle, MD on 09/20/2022 Stage prefix: Initial diagnosis Total positive nodes: 0   09/20/2022 Initial Diagnosis   Pancreatic cancer ([place])   09/28/2022 -  Chemotherapy   Patient is on Treatment Plan : PANCREAS Gemcitabine D1,8,15 (1000) q28d x 4 Cycles      Genetic Testing   [facility] CancerNext-Expanded Panel was Negative. Report date is 10/04/2022.  The CancerNext-Expanded gene panel offered by [facility] and includes sequencing, rearrangement, and RNA analysis for the following 77 genes: AIP, ALK, APC, ATM, AXIN2, BAP1, BARD1, BLM, BMPR1A, BRCA1, BRCA2, BRIP1, CDC73, CDH1, CDK4, CDKN1B, CDKN2A, CHEK2, CTNNA1, DICER1, FANCC, FH, FLCN, GALNT12, KIF1B, LZTR1, MAX, MEN1, MET, MLH1, MSH2, MSH3, MSH6, MUTYH, NBN, NF1, NF2, NTHL1, PALB2, PHOX2B, PMS2, POT1, PRKAR1A, PTCH1, PTEN, RAD51C, RAD51D, RB1, RECQL, RET, SDHA, SDHAF2, SDHB, SDHC, SDHD, SMAD4, SMARCA4, SMARCB1, SMARCE1, STK11, SUFU, TMEM127, TP53, TSC1, TSC2, VHL and XRCC2 (sequencing and deletion/duplication); EGFR, EGLN1, HOXB13, KIT, MITF, PDGFRA, POLD1, and POLE (sequencing only); EPCAM and GREM1 (deletion/duplication only).  INTERVAL HISTORY:  RAMARI BRAY is here for a follow up of pancreatic cancer. He was last seen by me on 09/28/22. He presents to the clinic accompanied by his wife. They report he did well with first treatment. His wife expressed concern with the change in his weight on our scales here. She explains his weight is stable at home.   All other systems were reviewed with the patient and are negative.  MEDICAL HISTORY:  Past Medical History:  Diagnosis Date   Allergy    SEASONAL   Cataract    BILATERAL-REMOVED   Essential hypertension, benign    PT.DENIES ON AS PREVENTIVE 08/17/19   GERD (gastroesophageal reflux disease)    Hx of adenomatous polyp of colon 09/04/2019   08/2019 diminutive adenoma No recall given findings and age   Mixed hyperlipidemia    PT.DENIES,STATED ON AS PREVENTIVE 07/30/19   Neuromuscular disorder (HCC)    PARKINSONS   OA (osteoarthritis) of knee    right   Obesity, unspecified    Osteoarthritis    KNEE AND SHOULDERS   Pancreatic cancer ([place]) 09/14/2022   diagnosed   Parkinsons    Type II or unspecified type diabetes mellitus without mention of complication, not stated as uncontrolled     SURGICAL HISTORY: Past Surgical History:  Procedure Laterality Date   BIOPSY  09/13/2022   Procedure: BIOPSY;  Surgeon: Irving Copas., MD;  Location: [facility];  Service: Gastroenterology;;   CATARACT EXTRACTION, BILATERAL     CHOLECYSTECTOMY N/A 07/02/2022   Procedure: LAPAROSCOPIC CHOLECYSTECTOMY WITH ICG DYE  INTRAOPERATIVE CHOLANGIOGRAM;  Surgeon: Coralie Keens, MD;  Location: [facility];  Service: General;  Laterality: N/A;   ESOPHAGOGASTRODUODENOSCOPY N/A 09/13/2022   Procedure: ESOPHAGOGASTRODUODENOSCOPY (EGD);  Surgeon: Irving Copas., MD;  Location: [facility];  Service: Gastroenterology;  Laterality: N/A;   EUS N/A 09/13/2022   Procedure: UPPER ENDOSCOPIC ULTRASOUND (EUS) RADIAL;  Surgeon: Rush Landmark Telford Nab., MD;  Location: [facility];  Service: Gastroenterology;  Laterality: N/A;   FINE NEEDLE ASPIRATION  09/13/2022   Procedure: FINE NEEDLE ASPIRATION (FNA) LINEAR;  Surgeon: Irving Copas., MD;  Location: [facility];  Service: Gastroenterology;;   GANGLION CYST EXCISION  age 51 years   right   KNEE ARTHROSCOPY     right   KNEE ARTHROSCOPY     TOTAL KNEE ARTHROPLASTY Right 03/08/2020   Procedure: RIGHT TOTAL KNEE ARTHROPLASTY;  Surgeon: Garald Balding, MD;  Location: [facility];  Service: Orthopedics;  Laterality: Right;   WRIST FRACTURE SURGERY     WRIST SURGERY  04/2012    I have reviewed the social history and family history with the patient and they are unchanged from previous note.  ALLERGIES:  is allergic to codeine.  MEDICATIONS:  Current Outpatient Medications  Medication Sig Dispense Refill   amLODipine (NORVASC) 10 MG tablet Take 1 tablet (10 mg total) by mouth daily. (Patient not taking: Reported on 09/20/2022) 30 tablet 0   ascorbic acid (VITAMIN C) 500 MG tablet Take 500 mg by mouth in the morning.     aspirin 81 MG chewable tablet Take 81 mg by mouth daily. Swallow whole.     carbidopa-levodopa (SINEMET CR) 50-200 MG tablet TAKE 1 TABLET BY MOUTH EVERYDAY AT BEDTIME 90 tablet 3   carbidopa-levodopa (SINEMET IR) 25-100 MG tablet TAKE 1 TABLET BY MOUTH 6 (SIX) TIMES DAILY. (Patient taking differently: Take 1 tablet by mouth at bedtime.) 540 tablet 3   Cinnamon 500 MG capsule Take 500 mg  by mouth daily.     famotidine (PEPCID) 20 MG tablet Take 20 mg by mouth in the morning and at bedtime.      fludrocortisone (FLORINEF) 0.1 MG tablet Take 1 tablet (0.1 mg total) by mouth daily. 30 tablet 5   Glucosamine-Chondroitin (COSAMIN DS PO) Take 1 tablet by mouth in the morning.     lidocaine-prilocaine (EMLA) cream Apply to affected area once 30 g 3   metFORMIN (GLUCOPHAGE) 1000 MG tablet Take 1 tablet (1,000 mg total) by mouth 2 (two) times daily with a meal. 20 tablet 0   Multiple Vitamin  (MULTIVITAMIN) tablet Take 1 tablet by mouth daily with breakfast.     ondansetron (ZOFRAN) 8 MG tablet Take 1 tablet (8 mg total) by mouth every 8 (eight) hours as needed for nausea or vomiting. 30 tablet 1   polyethylene glycol (MIRALAX / GLYCOLAX) 17 g packet Take 17 g by mouth every evening.     prochlorperazine (COMPAZINE) 10 MG tablet Take 1 tablet (10 mg total) by mouth every 6 (six) hours as needed for nausea or vomiting. 30 tablet 1   QUEtiapine (SEROQUEL XR) 50 MG TB24 24 hr tablet Take 50 mg by mouth at bedtime.     simvastatin (ZOCOR) 20 MG tablet Take 20 mg by mouth every Monday, Wednesday, and Friday at 6 PM.     No current facility-administered medications for this visit.   Facility-Administered Medications Ordered in Other Visits  Medication Dose Route Frequency Provider Last Rate Last Admin   0.9 %  sodium chloride infusion   Intravenous Once Truitt Merle, MD        PHYSICAL EXAMINATION: ECOG PERFORMANCE STATUS: 2 - Symptomatic, <50% confined to bed  There were no vitals filed for this visit. Wt Readings from Last 3 Encounters:  10/11/22 143 lb 8 oz (65.1 kg)  09/28/22 158 lb 3.2 oz (71.8 kg)  09/20/22 157 lb 9.6 oz (71.5 kg)     GENERAL:alert, no distress and comfortable SKIN: skin color normal, no rashes or significant lesions EYES: normal, Conjunctiva are pink and non-injected, sclera clear  NEURO: alert & oriented x 3 with fluent speech  LABORATORY DATA:  I have reviewed the data as listed    Latest Ref Rng & Units 10/11/2022    1:29 PM 09/28/2022   11:50 AM 09/20/2022    4:13 PM  CBC  WBC 4.0 - 10.5 K/uL 7.0  7.4  8.7   Hemoglobin 13.0 - 17.0 g/dL 12.2  12.4  12.9   Hematocrit 39.0 - 52.0 % 35.7  36.3  38.0   Platelets 150 - 400 K/uL 164  220  208         Latest Ref Rng & Units 10/11/2022    1:29 PM 09/28/2022   11:50 AM 09/20/2022    4:13 PM  CMP  Glucose 70 - 99 mg/dL 123  182  180   BUN 8 - 23 mg/dL _0 Creatinine 0.61 - 1.24 mg/dL 0.78   0.80  0.93   Sodium 135 - 145 mmol/L 137  139  137   Potassium 3.5 - 5.1 mmol/L 4.5  3.8  4.4   Chloride 98 - 111 mmol/L 104  103  102   CO2 22 - 32 mmol/L _1 Calcium 8.9 - 10.3 mg/dL 9.3  8.9  9.2   Total Protein 6.5 - 8.1 g/dL 6.6  6.6  7.2   Total Bilirubin  0.3 - 1.2 mg/dL 0.6  0.8  0.9   Alkaline Phos 38 - 126 U/L 72  76  81   AST 15 - 41 U/L _0 ALT 0 - 44 U/L _1 RADIOGRAPHIC STUDIES: I have personally reviewed the radiological images as listed and agreed with the findings in the report. No results found.    Orders Placed This Encounter  Procedures   CBC with Differential ([place] Only)    Standing Status:   Future    Standing Expiration Date:   11/23/2023   CMP ([place] only)    Standing Status:   Future    Standing Expiration Date:   11/23/2023   CBC with Differential ([place] Only)    Standing Status:   Future    Standing Expiration Date:   12/07/2023   CMP ([facility] only)    Standing Status:   Future    Standing Expiration Date:   12/07/2023   All questions were answered. The patient knows to call the clinic with any problems, questions or concerns. No barriers to learning was detected. The total time spent in the appointment was 30 minutes.     Truitt Merle, MD 10/11/2022   I, Wilburn Mylar, am acting as scribe for Truitt Merle, MD.   I have reviewed the above documentation for accuracy and completeness, and I agree with the above.

## 2022-10-11 NOTE — Patient Instructions (Signed)
[facility] MEDICAL ONCOLOGY  Discharge Instructions: Thank you for choosing [facility] to provide your oncology and hematology care.   If you have a lab appointment with the Cancer Center, please go directly to the Cancer Center and check in at the registration area.   Wear comfortable clothing and clothing appropriate for easy access to any Portacath or PICC line.   We strive to give you quality time with your provider. You may need to reschedule your appointment if you arrive late (15 or more minutes).  Arriving late affects you and other patients whose appointments are after yours.  Also, if you miss three or more appointments without notifying the office, you may be dismissed from the clinic at the provider's discretion.      For prescription refill requests, have your pharmacy contact our office and allow 72 hours for refills to be completed.    Today you received the following chemotherapy and/or immunotherapy agents: Gemzar      To help prevent nausea and vomiting after your treatment, we encourage you to take your nausea medication as directed.  BELOW ARE SYMPTOMS THAT SHOULD BE REPORTED IMMEDIATELY: *FEVER GREATER THAN 100.4 F (38 C) OR HIGHER *CHILLS OR SWEATING *NAUSEA AND VOMITING THAT IS NOT CONTROLLED WITH YOUR NAUSEA MEDICATION *UNUSUAL SHORTNESS OF BREATH *UNUSUAL BRUISING OR BLEEDING *URINARY PROBLEMS (pain or burning when urinating, or frequent urination) *BOWEL PROBLEMS (unusual diarrhea, constipation, pain near the anus) TENDERNESS IN MOUTH AND THROAT WITH OR WITHOUT PRESENCE OF ULCERS (sore throat, sores in mouth, or a toothache) UNUSUAL RASH, SWELLING OR PAIN  UNUSUAL VAGINAL DISCHARGE OR ITCHING   Items with * indicate a potential emergency and should be followed up as soon as possible or go to the Emergency Department if any problems should occur.  Please show the CHEMOTHERAPY ALERT CARD or IMMUNOTHERAPY ALERT CARD at check-in to the  Emergency Department and triage nurse.  Should you have questions after your visit or need to cancel or reschedule your appointment, please contact [facility] MEDICAL ONCOLOG[facility]  Dept: [phone]  and follow the prompts.  Office hours are 8:00 a.m. to 4:30 p.m. Monday - Friday. Please note that voicemails left after 4:00 p.m. may not be returned until the following business day.  We are closed weekends and major holidays. You have access to a nurse at all times for urgent questions. Please call the main number to the clinic Dept: [phone] and follow the prompts.   For any non-urgent questions, you may also contact your provider using MyChart. We now offer e-Visits for anyone 18 and older to request care online for non-urgent symptoms. For details visit [email or web address].   Also download the MyChart app! Go to the app store, search "MyChart", open the app, select [facility] [place], and log in with your MyChart username and password.  Masks are optional in the cancer centers. If you would like for your care team to wear a mask while they are taking care of you, please let them know. You may have one support person who is at least 71 years old accompany you for your appointments. 

## 2022-10-11 NOTE — Telephone Encounter (Signed)
EUS scheduled, pt instructed and medications reviewed.  Patient instructions mailed to home.  Patient to call with any questions or concerns.  

## 2022-10-11 NOTE — Telephone Encounter (Signed)
EUS fiducial rescheduled for 12/13/22 at 845 am at [facility] with GM  Left message on machine to call back

## 2022-10-11 NOTE — Progress Notes (Signed)
Nutrition Follow-up:  Patient with pancreatic cancer. He is receiving gemcitabine q28d.   Met with patient in infusion. Wife is present for visit. Patient endorses good appetite. Wife reports he eats well at home. Portion sizes are smaller than baseline, but he has been eating more frequently. Wife recalls spaghetti with salad and bread for supper last night. He enjoys steak, hamburger helper, eggs, pudding. Patient planning to eat shrimp scampi from [employer] after treatment today. He is drinking 1-2 Equate Complete.     Medications: reviewed   Labs: glucose 123  Anthropometrics: Wife reports weights are stable on home scale. Pt weighed 157.2 lb this morning. Suspect wt obtained today (143 lb 8 oz) due to pt holding on to grab bars for support   10/5 - 157.6 lb    NUTRITION DIAGNOSIS: Unintended weight loss stable   INTERVENTION:  Continue drinking Ensure Complete/equivalent twice daily in between meals - coupons provided  Encouraged soft moist high protein foods for ease of intake - handout with ideas provided     MONITORING, EVALUATION, GOAL: weight trends, intake   NEXT VISIT: To be scheduled with treatment as needed

## 2022-10-15 ENCOUNTER — Telehealth: Payer: Self-pay

## 2022-10-15 NOTE — Telephone Encounter (Signed)
Attempted to contact patient's spouse [name] to schedule a Palliative Care consult appointment. No answer left a message to return call.

## 2022-10-16 ENCOUNTER — Telehealth: Payer: Self-pay

## 2022-10-16 NOTE — Telephone Encounter (Signed)
Spoke with patient's spouse [name] and scheduled a telephonic Palliative Consult for 10/23/22 @ 11:30 AM.  Consent obtained; updated Netsmart, Team List and Epic.

## 2022-10-17 ENCOUNTER — Telehealth: Payer: Self-pay | Admitting: Neurology

## 2022-10-17 NOTE — Telephone Encounter (Signed)
LVM informing pt of need to reschedule 11/2 appointment - MD out

## 2022-10-18 ENCOUNTER — Other Ambulatory Visit: Payer: Self-pay

## 2022-10-18 ENCOUNTER — Ambulatory Visit: Payer: Medicare Other | Admitting: Neurology

## 2022-10-23 ENCOUNTER — Other Ambulatory Visit: Payer: Medicare Other | Admitting: Student

## 2022-10-23 DIAGNOSIS — R531 Weakness: Secondary | ICD-10-CM

## 2022-10-23 DIAGNOSIS — R5383 Other fatigue: Secondary | ICD-10-CM

## 2022-10-23 DIAGNOSIS — G20A2 Parkinson's disease without dyskinesia, with fluctuations: Secondary | ICD-10-CM

## 2022-10-23 DIAGNOSIS — C251 Malignant neoplasm of body of pancreas: Secondary | ICD-10-CM

## 2022-10-23 DIAGNOSIS — Z515 Encounter for palliative care: Secondary | ICD-10-CM

## 2022-10-23 DIAGNOSIS — E46 Unspecified protein-calorie malnutrition: Secondary | ICD-10-CM

## 2022-10-23 NOTE — Progress Notes (Unsigned)
[facility] Consult Note Telephone: [phone]  Fax: [phone]   Date of encounter: 10/23/22 11:35 AM PATIENT NAME: Danny [name] [id number]   [phone] (home)  DOB: 1951-11-21 MRN: [id number] PRIMARY CARE PROVIDER:    [name], [place],  [address] [place] [address] [phone]  REFERRING PROVIDER:   [name], [address] [place],  [place] [address] [phone]  RESPONSIBLE PARTY:    Contact Information     Name Relation Home Work [place] Spouse [phone]  [phone]       Due to the COVID-19 crisis, this visit was completed via telemedicine from my office and it was initiated and consent by this patient or PROXY. This home visit was completed via telephone due to the patient's inability to connect via an audiovisual connection or their refusal to have an in-person visit. This connection was agreed to by the patient or PROXY. I verified that I am speaking with the correct person using two identifiers.                                    ASSESSMENT AND PLAN / RECOMMENDATIONS:   Advance Care Planning/Goals of Care: Goals include to maximize quality of life and symptom management. Patient/health care surrogate gave his/her permission to discuss.Our advance care planning conversation included a discussion about:    The value and importance of advance care planning  Experiences with loved ones who have been seriously ill or have died  Exploration of personal, cultural or spiritual beliefs that might influence medical decisions  Exploration of goals of care in the event of a sudden injury or illness  Identification  of a healthcare agent  Review and updating or creation of an  advance directive document CODE STATUS: DNR  Education provided on Palliative Medicine. Will continue to provide supportive care. Patient  currently receiving chemotherapy every 2 weeks. He is currently receiving HH therapy.   Symptom Management/Plan:  Pancreatic cancer-patient currently receiving gemcitabine ever 2 weeks. He tolerated first treatment well; he endorses increased fatigue after second treatment. He expresses wanting to continue treatments. He is to have third treatment tomorrow; he is to follow up with oncology as scheduled.   Fatigue, weakness-likely multifactorial. He reports increased weakness after receiving second chemotherapy treatment. He is scheduled to receive third treatment tomorrow. Encourage adequate nutritional intake, supplements, continue physical therapy as directed.  Parkinson's disease-increased stiffness; continue carbidopa-levodopa as directed. Wife to continue providing supportive care.   Protein calorie malnutrition, weight loss-increase protein shake to BID; continue offering foods patient enjoys.   Follow up Palliative Care Visit: Palliative care will continue to follow for complex medical decision making, advance care planning, and clarification of goals. Return in 3-4 weeks or prn.  I spent 33 minutes providing this consultation. More than 50% of the time in this consultation was spent in counseling and care coordination.   PPS: 40%  HOSPICE ELIGIBILITY/DIAGNOSIS: TBD  Chief Complaint: Palliative Medicine initial consult.   HISTORY OF PRESENT ILLNESS:  Danny [name] is a 71 y.o. year old male  with Pancreatic cancer, Parkinson's, debility, hypertension, hypotension, hyperlipidemia, T2DM.  Patient previously followed by palliative at home, then when at [facility]. He has returned home with his wife. He is currently receiving gemcitabine every 2 weeks for pancreatic cancer. He will be  receiving 3rd  treatment tomorrow. He endorses increased fatigue, weakness. His appetite has started to decline. Weight: 155 pounds; down 9 pounds since August. He denies pain; although yesterday he c/o back and  neck pain as he was out for extended period of time. Tylenol effective for pain. No shortness of breath, nausea. Wife reports increased stiffness; he is ambulating short distances.    History obtained from review of EMR, discussion with primary team, and interview with family, facility staff/caregiver and/or Danny [name].  I reviewed available labs, medications, imaging, studies and related documents from the EMR.  Records reviewed and summarized above.   ROS  10-point ROS is negative, except for the pertinent positives and negatives detailed per the HPI.   Physical Exam:  Deferred d/t this being a telemedicine visit.   CURRENT PROBLEM LIST:  Patient Active Problem List   Diagnosis Date Noted   Genetic testing 10/04/2022   Pancreatic cancer ([facility]) 09/20/2022   Acute pancreatitis 08/12/2022   Possible pancreatic lesion 07/03/2022   Acute cholecystitis 07/01/2022   Hypotension 07/01/2022   Low back pain 03/29/2022   Hemarthrosis of right knee 06/30/2020   Heterotopic calcification, postoperative 06/30/2020   Total knee replacement status, right 03/22/2020   Hematospermia 11/18/2015   Epididymal cyst 05/25/2015   Parkinson disease ([facility]) 01/14/2014   Obesity (BMI 30-39.9) 07/14/2013   Confusional arousals 02/13/2013   Disorder of ligament of right wrist 01/29/2012   Diabetes mellitus type 2, uncomplicated ([facility]) 93/81/8299   Mixed hyperlipidemia 10/09/2011   Unilateral primary osteoarthritis, right knee 10/09/2011   Essential hypertension, benign 10/09/2011   PAST MEDICAL HISTORY:  Active Ambulatory Problems    Diagnosis Date Noted   Diabetes mellitus type 2, uncomplicated ([facility]) 37/16/9678   Mixed hyperlipidemia 10/09/2011   Unilateral primary osteoarthritis, right knee 10/09/2011   Essential hypertension, benign 10/09/2011   Disorder of ligament of right wrist 01/29/2012   Confusional arousals 02/13/2013   Obesity (BMI 30-39.9) 07/14/2013   Parkinson disease ([facility]) 01/14/2014    Epididymal cyst 05/25/2015   Hematospermia 11/18/2015   Total knee replacement status, right 03/22/2020   Hemarthrosis of right knee 06/30/2020   Heterotopic calcification, postoperative 06/30/2020   Low back pain 03/29/2022   Acute cholecystitis 07/01/2022   Hypotension 07/01/2022   Possible pancreatic lesion 07/03/2022   Acute pancreatitis 08/12/2022   Pancreatic cancer ([facility]) 09/20/2022   Genetic testing 10/04/2022   Resolved Ambulatory Problems    Diagnosis Date Noted   Hx of adenomatous polyp of colon 09/04/2019   Past Medical History:  Diagnosis Date   Allergy    Cataract    GERD (gastroesophageal reflux disease)    Neuromuscular disorder (HCC)    OA (osteoarthritis) of knee    Obesity, unspecified    Osteoarthritis    Parkinsons    Type II or unspecified type diabetes mellitus without mention of complication, not stated as uncontrolled    SOCIAL HX:  Social History   Tobacco Use   Smoking status: Former    Packs/day: 1.50    Years: 15.00    Total pack years: 22.50    Types: Cigarettes    Quit date: 12/17/1982    Years since quitting: 39.8   Smokeless tobacco: Never   Tobacco comments:    over 40 yrs  Substance Use Topics   Alcohol use: Not Currently   FAMILY HX:  Family History  Problem Relation Age of Onset   Diabetes Mother    Lung cancer Mother    Cancer Father  unknown type   Heart disease Father    Diabetes Sister    Cancer Maternal Uncle        gastric cancer   Diabetes Daughter    Colon polyps Son    Colon cancer Neg Hx    Esophageal cancer Neg Hx    Rectal cancer Neg Hx    Stomach cancer Neg Hx    Parkinson's disease Neg Hx       ALLERGIES:  Allergies  Allergen Reactions   Codeine Nausea And Vomiting     PERTINENT MEDICATIONS:  Outpatient Encounter Medications as of 10/23/2022  Medication Sig   amLODipine (NORVASC) 10 MG tablet Take 1 tablet (10 mg total) by mouth daily. (Patient not taking: Reported on 09/20/2022)    ascorbic acid (VITAMIN C) 500 MG tablet Take 500 mg by mouth in the morning.   aspirin 81 MG chewable tablet Take 81 mg by mouth daily. Swallow whole.   carbidopa-levodopa (SINEMET CR) 50-200 MG tablet TAKE 1 TABLET BY MOUTH EVERYDAY AT BEDTIME   carbidopa-levodopa (SINEMET IR) 25-100 MG tablet TAKE 1 TABLET BY MOUTH 6 (SIX) TIMES DAILY. (Patient taking differently: Take 1 tablet by mouth at bedtime.)   Cinnamon 500 MG capsule Take 500 mg by mouth daily.   famotidine (PEPCID) 20 MG tablet Take 20 mg by mouth in the morning and at bedtime.    fludrocortisone (FLORINEF) 0.1 MG tablet Take 1 tablet (0.1 mg total) by mouth daily.   Glucosamine-Chondroitin (COSAMIN DS PO) Take 1 tablet by mouth in the morning.   lidocaine-prilocaine (EMLA) cream Apply to affected area once   metFORMIN (GLUCOPHAGE) 1000 MG tablet Take 1 tablet (1,000 mg total) by mouth 2 (two) times daily with a meal.   Multiple Vitamin (MULTIVITAMIN) tablet Take 1 tablet by mouth daily with breakfast.   ondansetron (ZOFRAN) 8 MG tablet Take 1 tablet (8 mg total) by mouth every 8 (eight) hours as needed for nausea or vomiting.   polyethylene glycol (MIRALAX / GLYCOLAX) 17 g packet Take 17 g by mouth every evening.   prochlorperazine (COMPAZINE) 10 MG tablet Take 1 tablet (10 mg total) by mouth every 6 (six) hours as needed for nausea or vomiting.   QUEtiapine (SEROQUEL XR) 50 MG TB24 24 hr tablet Take 50 mg by mouth at bedtime.   simvastatin (ZOCOR) 20 MG tablet Take 20 mg by mouth every Monday, Wednesday, and Friday at 6 PM.   No facility-administered encounter medications on file as of 10/23/2022.   Thank you for the opportunity to participate in the care of Danny [name].  The palliative care team will continue to follow. Please call our office at [phone] if we can be of additional assistance.   Ezekiel Slocumb, NP   COVID-19 PATIENT SCREENING TOOL Asked and negative response unless otherwise noted:  Have you had symptoms of  covid, tested positive or been in contact with someone with symptoms/positive test in the past 5-10 days? No

## 2022-10-24 ENCOUNTER — Inpatient Hospital Stay (HOSPITAL_BASED_OUTPATIENT_CLINIC_OR_DEPARTMENT_OTHER): Payer: Medicare Other | Admitting: Hematology

## 2022-10-24 ENCOUNTER — Other Ambulatory Visit: Payer: Self-pay

## 2022-10-24 ENCOUNTER — Inpatient Hospital Stay: Payer: Medicare Other

## 2022-10-24 ENCOUNTER — Encounter: Payer: Self-pay | Admitting: Hematology

## 2022-10-24 ENCOUNTER — Inpatient Hospital Stay: Payer: Medicare Other | Attending: Hematology

## 2022-10-24 VITALS — BP 120/73 | HR 86 | Temp 98.6°F | Resp 16

## 2022-10-24 VITALS — BP 154/91 | HR 79 | Resp 18

## 2022-10-24 DIAGNOSIS — Z5111 Encounter for antineoplastic chemotherapy: Secondary | ICD-10-CM | POA: Insufficient documentation

## 2022-10-24 DIAGNOSIS — C251 Malignant neoplasm of body of pancreas: Secondary | ICD-10-CM | POA: Diagnosis present

## 2022-10-24 LAB — CBC WITH DIFFERENTIAL (CANCER CENTER ONLY)
Abs Immature Granulocytes: 0.02 10*3/uL (ref 0.00–0.07)
Basophils Absolute: 0.1 10*3/uL (ref 0.0–0.1)
Basophils Relative: 1 %
Eosinophils Absolute: 0.2 10*3/uL (ref 0.0–0.5)
Eosinophils Relative: 2 %
HCT: 35.3 % — ABNORMAL LOW (ref 39.0–52.0)
Hemoglobin: 11.8 g/dL — ABNORMAL LOW (ref 13.0–17.0)
Immature Granulocytes: 0 %
Lymphocytes Relative: 27 %
Lymphs Abs: 1.9 10*3/uL (ref 0.7–4.0)
MCH: 31.3 pg (ref 26.0–34.0)
MCHC: 33.4 g/dL (ref 30.0–36.0)
MCV: 93.6 fL (ref 80.0–100.0)
Monocytes Absolute: 0.6 10*3/uL (ref 0.1–1.0)
Monocytes Relative: 8 %
Neutro Abs: 4.5 10*3/uL (ref 1.7–7.7)
Neutrophils Relative %: 62 %
Platelet Count: 165 10*3/uL (ref 150–400)
RBC: 3.77 MIL/uL — ABNORMAL LOW (ref 4.22–5.81)
RDW: 16.4 % — ABNORMAL HIGH (ref 11.5–15.5)
WBC Count: 7.2 10*3/uL (ref 4.0–10.5)
nRBC: 0 % (ref 0.0–0.2)

## 2022-10-24 LAB — CMP (CANCER CENTER ONLY)
ALT: 10 U/L (ref 0–44)
AST: 21 U/L (ref 15–41)
Albumin: 3.7 g/dL (ref 3.5–5.0)
Alkaline Phosphatase: 60 U/L (ref 38–126)
Anion gap: 13 (ref 5–15)
BUN: 22 mg/dL (ref 8–23)
CO2: 24 mmol/L (ref 22–32)
Calcium: 9.5 mg/dL (ref 8.9–10.3)
Chloride: 104 mmol/L (ref 98–111)
Creatinine: 0.72 mg/dL (ref 0.61–1.24)
GFR, Estimated: >60 mL/min (ref >60)
Glucose, Bld: 119 mg/dL — ABNORMAL HIGH (ref 70–99)
Potassium: 4.7 mmol/L (ref 3.5–5.1)
Sodium: 141 mmol/L (ref 135–145)
Total Bilirubin: 0.8 mg/dL (ref 0.3–1.2)
Total Protein: 6.6 g/dL (ref 6.5–8.1)

## 2022-10-24 MED ORDER — PROCHLORPERAZINE MALEATE 10 MG PO TABS
10.0000 mg | ORAL_TABLET | Freq: Once | ORAL | Status: AC
  Administered 2022-10-24: 10 mg via ORAL
  Filled 2022-10-24: qty 1

## 2022-10-24 MED ORDER — SODIUM CHLORIDE 0.9 % IV SOLN
800.0000 mg/m2 | Freq: Once | INTRAVENOUS | Status: AC
  Administered 2022-10-24: 1444 mg via INTRAVENOUS
  Filled 2022-10-24: qty 37.98

## 2022-10-24 MED ORDER — SODIUM CHLORIDE 0.9 % IV SOLN: Freq: Once | INTRAVENOUS | Status: AC

## 2022-10-24 NOTE — Patient Instructions (Signed)
[facility]  Discharge Instructions: Thank you for choosing [facility] to provide your oncology and hematology care.   If you have a lab appointment with the Cancer Center, please go directly to the Cancer Center and check in at the registration area.   Wear comfortable clothing and clothing appropriate for easy access to any Portacath or PICC line.   We strive to give you quality time with your provider. You may need to reschedule your appointment if you arrive late (15 or more minutes).  Arriving late affects you and other patients whose appointments are after yours.  Also, if you miss three or more appointments without notifying the office, you may be dismissed from the clinic at the provider's discretion.      For prescription refill requests, have your pharmacy contact our office and allow 72 hours for refills to be completed.    Today you received the following chemotherapy and/or immunotherapy agents: Gemzar      To help prevent nausea and vomiting after your treatment, we encourage you to take your nausea medication as directed.  BELOW ARE SYMPTOMS THAT SHOULD BE REPORTED IMMEDIATELY: *FEVER GREATER THAN 100.4 F (38 C) OR HIGHER *CHILLS OR SWEATING *NAUSEA AND VOMITING THAT IS NOT CONTROLLED WITH YOUR NAUSEA MEDICATION *UNUSUAL SHORTNESS OF BREATH *UNUSUAL BRUISING OR BLEEDING *URINARY PROBLEMS (pain or burning when urinating, or frequent urination) *BOWEL PROBLEMS (unusual diarrhea, constipation, pain near the anus) TENDERNESS IN MOUTH AND THROAT WITH OR WITHOUT PRESENCE OF ULCERS (sore throat, sores in mouth, or a toothache) UNUSUAL RASH, SWELLING OR PAIN  UNUSUAL VAGINAL DISCHARGE OR ITCHING   Items with * indicate a potential emergency and should be followed up as soon as possible or go to the Emergency Department if any problems should occur.  Please show the CHEMOTHERAPY ALERT CARD or IMMUNOTHERAPY ALERT CARD at check-in to the  Emergency Department and triage nurse.  Should you have questions after your visit or need to cancel or reschedule your appointment, please contact [facility]  Dept: [phone]  and follow the prompts.  Office hours are 8:00 a.m. to 4:30 p.m. Monday - Friday. Please note that voicemails left after 4:00 p.m. may not be returned until the following business day.  We are closed weekends and major holidays. You have access to a nurse at all times for urgent questions. Please call the main number to the clinic Dept: [phone] and follow the prompts.   For any non-urgent questions, you may also contact your provider using MyChart. We now offer e-Visits for anyone 18 and older to request care online for non-urgent symptoms. For details visit [email or web address].   Also download the MyChart app! Go to the app store, search "MyChart", open the app, select [facility], and log in with your MyChart username and password.  Masks are optional in the cancer centers. If you would like for your care team to wear a mask while they are taking care of you, please let them know. You may have one support person who is at least 71 years old accompany you for your appointments. 

## 2022-10-24 NOTE — Progress Notes (Signed)
[place]   [phone] [phone]   Clinic Follow up Note   Patient Care Team: [name], PA as PCP - General (Family Medicine) [place], Heather, [place] (Optometry) [name], MD as Attending Physician (Neurology) Truitt Merle, MD as Consulting Physician (Hematology) Dwan Bolt, MD as Consulting Physician (General Surgery) Mansouraty, Telford Nab., MD as Consulting Physician (Gastroenterology)  Date of Service:  10/24/2022  CHIEF COMPLAINT: f/u of pancreatic cancer  CURRENT THERAPY:  Gemcitabine, q14d, starting 09/28/22   ASSESSMENT & PLAN:  Danny [name] is a 70 y.o. male with   1. Adenocarcinoma of Pancreatic body, cT2N0M0, stage IB -while undergoing work up for cholecystectomy, which was performed on 07/02/22, he was noted to have pancreatic duct abnormality on preoperative CT (07/01/22) and ductal enlargement in pancreas with enlargement of mesenteric lymph nodes on intraoperative cholangiogram. -Abdomen MRI on 08/11/22 showed: focal pancreatitis; discrete lesion in mid body of pancreas. -baseline CA 19-9 on 08/14/22 significantly elevated at 587. -EGD/EUS on 09/13/22 by Dr. Rush Landmark showed: mass-like area in genu/body of pancreas, and no malignant-appearing lymph nodes, staged as T2 N0. Cytology from pancreas body confirmed adenocarcinoma. -they opted not to pursue surgery due to comorbidities and overall poor PS -repeat CA 19-9 on 09/20/22 rose to 1,183. -he began single agent gemcitabine on 09/28/22. He experienced more fatigue and loss of appetite after second dose. We discussed decreasing the dose, he agreed and wants to continue chemo for now  -Labs reviewed, overall stable, adequate to proceed today. Repeat CA 19-9 today is pending. -Plan to have consolidation SBRT in Dec if no disease progression.   2.  Comorbidities: Parkinson disease, DM, HTN, OA -Continue Sinemet follow-up with neurology -continue meds and f/u with PCP     PLAN:  -proceed with gemcitabine today and every 2 weeks -lab, f/u, and gemcitabine every 2 weeks   No problem-specific Assessment & Plan notes found for this encounter.   SUMMARY OF ONCOLOGIC HISTORY: Oncology History  Pancreatic cancer ([place])  07/01/2022 Imaging   EXAM: CT ANGIOGRAPHY CHEST CT ABDOMEN AND PELVIS WITH CONTRAST  IMPRESSION: 1. No evidence of pulmonary embolism or other acute cardiopulmonary process. 2. Mild-moderate gallbladder distension with diffuse gallbladder wall thickening and pericholecystic fluid/stranding, suggestive of acute cholecystitis. Further evaluation with right upper quadrant ultrasound is recommended. 3. Appearance of the pancreas suggests mild acute uncomplicated pancreatitis. Correlate with serum lipase. There is a short segment of mild pancreatic ductal dilatation within the pancreatic tail. 4. Slightly increased attenuation within the central mesentery with several small mesenteric lymph nodes, which can be seen in the setting of mesenteric panniculitis. 5. Moderate volume of stool within the colon. 6. Aortic and coronary artery atherosclerosis (ICD10-I70.0).   07/02/2022 Procedure   LAPAROSCOPIC CHOLECYSTECTOMY WITH INTRAOPERATIVE CHOLANGIOGRAM, Dr. Ninfa Linden   08/11/2022 Imaging   CLINICAL DATA:  Pancreatic mass  EXAM: MRI ABDOMEN WITHOUT AND WITH CONTRAST  IMPRESSION: 1. Findings suggest focal pancreatitis involving the mid pancreas as seen on comparison CTs. No organized fluid collections. 2. Discrete hypoenhancing lesion in the mid body the pancreas with upstream duct dilatation. While it is unusual for pancreatic adenocarcinoma to present as acute pancreatitis, this differential diagnosis warrants further evaluation. Recommend correlation with CA 19- 9 serum levels and if elevated consider endoscopic ultrasound for evaluation the mid body of the pancreas. If CA 19 -9 normal, recommend follow-up MRI with without contrast after  acute pancreatitis subsided to re-evaluate hypoenhancing lesion in the mid body the pancreas. 3. No MRI evidence  complication following cholecystectomy.   08/12/2022 Imaging   CLINICAL DATA:  Abdominal pain, acute, nonlocalized   EXAM: CT ABDOMEN AND PELVIS WITH CONTRAST  IMPRESSION: 1. Increase edema/inflammation around the pancreatic head and body, with contiguous inflammatory/edematous changes extending adjacent to the proximal duodenum, ascending colon and hepatic flexure, and into central mesentery suggesting worsening pancreatitis. 2. Fluid in the right pericolic gutter and pelvis, possibly related to pancreatitis. Bile leak from recent cholecystectomy is considered less likely possibility. 3. Coronary and Aortic Atherosclerosis (ICD10-170.0).   08/14/2022 Tumor Marker   Patient's tumor was tested for the following markers: CA 19-9. Results of the tumor marker test revealed significant elevation: 587.   09/13/2022 Procedure   Upper EUS, Dr. Rush Landmark  Impression: EGD impression: - No gross lesions in esophagus proximally. - Z-line irregular, 33 cm from the incisors. Salmon-colored mucosa suspicious for long-segment Barrett's esophagus noted distally - biopsied. - 3 cm hiatal hernia. - Erythematous mucosa in the antrum. No gross lesions in the stomach. Biopsied. - No gross lesions in the duodenal bulb, in the first portion of the duodenum and in the second portion of the duodenum. - Normal major papilla.  EUS Impression: - A masslike area was identified in the genu/body of pancreas. Cytology results are pending. However, the endosonographic appearance is highly suspicious for adenocarcinoma. This was staged T2 N0 Mx by endosonographic criteria. The staging applies if malignancy is confirmed. Fine needle biopsy performed. Based on the imaging findings MRI there has been concern that this area was approximately 4 cm in size so T staging would change if that were to be true but I  am more suspicious that the area is just a result of recent pancreatitis changes more than truly being a 4 cm mass. - There was no sign of significant pathology in the common bile duct and in the common hepatic duct. - No malignant-appearing lymph nodes were visualized in the celiac region (level 20), peripancreatic region and porta hepatis region.   09/13/2022 Initial Biopsy   A. PANCREAS, BODY, LESION, FINE NEEDLE ASPIRATION:   FINAL MICROSCOPIC DIAGNOSIS:  - Malignant cells consistent with adenocarcinoma    09/13/2022 Cancer Staging   Staging form: Exocrine Pancreas, AJCC 8th Edition - Clinical stage from 09/13/2022: Stage IB (cT2, cN0, cM0) - Signed by Truitt Merle, MD on 09/20/2022 Stage prefix: Initial diagnosis Total positive nodes: 0   09/20/2022 Initial Diagnosis   Pancreatic cancer ([place])   09/28/2022 -  Chemotherapy   Patient is on Treatment Plan : PANCREAS Gemcitabine D1,8,15 (1000) q28d x 4 Cycles      Genetic Testing   [facility] CancerNext-Expanded Panel was Negative. Report date is 10/04/2022.  The CancerNext-Expanded gene panel offered by [facility] and includes sequencing, rearrangement, and RNA analysis for the following 77 genes: AIP, ALK, APC, ATM, AXIN2, BAP1, BARD1, BLM, BMPR1A, BRCA1, BRCA2, BRIP1, CDC73, CDH1, CDK4, CDKN1B, CDKN2A, CHEK2, CTNNA1, DICER1, FANCC, FH, FLCN, GALNT12, KIF1B, LZTR1, MAX, MEN1, MET, MLH1, MSH2, MSH3, MSH6, MUTYH, NBN, NF1, NF2, NTHL1, PALB2, PHOX2B, PMS2, POT1, PRKAR1A, PTCH1, PTEN, RAD51C, RAD51D, RB1, RECQL, RET, SDHA, SDHAF2, SDHB, SDHC, SDHD, SMAD4, SMARCA4, SMARCB1, SMARCE1, STK11, SUFU, TMEM127, TP53, TSC1, TSC2, VHL and XRCC2 (sequencing and deletion/duplication); EGFR, EGLN1, HOXB13, KIT, MITF, PDGFRA, POLD1, and POLE (sequencing only); EPCAM and GREM1 (deletion/duplication only).        INTERVAL HISTORY:  Danny [name] is here for a follow up of pancreatic cancer. He was last seen by me on 10/11/22. He presents to the clinic  accompanied by his wife. They report this last cycle was hard on him. His wife reports his appetite and fatigue worsened. She explains he has recovered now. He denies nausea, endorses drinking Ensure when he has no appetite.   All other systems were reviewed with the patient and are negative.  MEDICAL HISTORY:  Past Medical History:  Diagnosis Date   Allergy    SEASONAL   Cataract    BILATERAL-REMOVED   Essential hypertension, benign    PT.DENIES ON AS PREVENTIVE 08/17/19   GERD (gastroesophageal reflux disease)    Hx of adenomatous polyp of colon 09/04/2019   08/2019 diminutive adenoma No recall given findings and age   Mixed hyperlipidemia    PT.DENIES,STATED ON AS PREVENTIVE 07/30/19   Neuromuscular disorder (HCC)    PARKINSONS   OA (osteoarthritis) of knee    right   Obesity, unspecified    Osteoarthritis    KNEE AND SHOULDERS   Pancreatic cancer ([place]) 09/14/2022   diagnosed   Parkinsons    Type II or unspecified type diabetes mellitus without mention of complication, not stated as uncontrolled     SURGICAL HISTORY: Past Surgical History:  Procedure Laterality Date   BIOPSY  09/13/2022   Procedure: BIOPSY;  Surgeon: Irving Copas., MD;  Location: [facility];  Service: Gastroenterology;;   CATARACT EXTRACTION, BILATERAL     CHOLECYSTECTOMY N/A 07/02/2022   Procedure: LAPAROSCOPIC CHOLECYSTECTOMY WITH ICG DYE  INTRAOPERATIVE CHOLANGIOGRAM;  Surgeon: Coralie Keens, MD;  Location: [facility];  Service: General;  Laterality: N/A;   ESOPHAGOGASTRODUODENOSCOPY N/A 09/13/2022   Procedure: ESOPHAGOGASTRODUODENOSCOPY (EGD);  Surgeon: Irving Copas., MD;  Location: [facility];  Service: Gastroenterology;  Laterality: N/A;   EUS N/A 09/13/2022   Procedure: UPPER ENDOSCOPIC ULTRASOUND (EUS) RADIAL;  Surgeon: Rush Landmark Telford Nab., MD;  Location: [facility];  Service: Gastroenterology;  Laterality: N/A;   FINE NEEDLE ASPIRATION  09/13/2022   Procedure: FINE NEEDLE  ASPIRATION (FNA) LINEAR;  Surgeon: Irving Copas., MD;  Location: [facility];  Service: Gastroenterology;;   GANGLION CYST EXCISION  age 66 years   right   KNEE ARTHROSCOPY     right   KNEE ARTHROSCOPY     TOTAL KNEE ARTHROPLASTY Right 03/08/2020   Procedure: RIGHT TOTAL KNEE ARTHROPLASTY;  Surgeon: Garald Balding, MD;  Location: [facility];  Service: Orthopedics;  Laterality: Right;   WRIST FRACTURE SURGERY     WRIST SURGERY  04/2012    I have reviewed the social history and family history with the patient and they are unchanged from previous note.  ALLERGIES:  is allergic to codeine.  MEDICATIONS:  Current Outpatient Medications  Medication Sig Dispense Refill   amLODipine (NORVASC) 10 MG tablet Take 1 tablet (10 mg total) by mouth daily. (Patient not taking: Reported on 09/20/2022) 30 tablet 0   ascorbic acid (VITAMIN C) 500 MG tablet Take 500 mg by mouth in the morning.     aspirin 81 MG chewable tablet Take 81 mg by mouth daily. Swallow whole.     carbidopa-levodopa (SINEMET CR) 50-200 MG tablet TAKE 1 TABLET BY MOUTH EVERYDAY AT BEDTIME 90 tablet 3   carbidopa-levodopa (SINEMET IR) 25-100 MG tablet TAKE 1 TABLET BY MOUTH 6 (SIX) TIMES DAILY. (Patient taking differently: Take 1 tablet by mouth at bedtime.) 540 tablet 3   Cinnamon 500 MG capsule Take 500 mg by mouth daily.     famotidine (PEPCID) 20 MG tablet Take 20 mg by mouth in the morning and at bedtime.  fludrocortisone (FLORINEF) 0.1 MG tablet Take 1 tablet (0.1 mg total) by mouth daily. 30 tablet 5   Glucosamine-Chondroitin (COSAMIN DS PO) Take 1 tablet by mouth in the morning.     lidocaine-prilocaine (EMLA) cream Apply to affected area once 30 g 3   metFORMIN (GLUCOPHAGE) 1000 MG tablet Take 1 tablet (1,000 mg total) by mouth 2 (two) times daily with a meal. 20 tablet 0   Multiple Vitamin (MULTIVITAMIN) tablet Take 1 tablet by mouth daily with breakfast.     ondansetron (ZOFRAN) 8 MG tablet Take 1 tablet (8 mg  total) by mouth every 8 (eight) hours as needed for nausea or vomiting. 30 tablet 1   polyethylene glycol (MIRALAX / GLYCOLAX) 17 g packet Take 17 g by mouth every evening.     prochlorperazine (COMPAZINE) 10 MG tablet Take 1 tablet (10 mg total) by mouth every 6 (six) hours as needed for nausea or vomiting. 30 tablet 1   QUEtiapine (SEROQUEL XR) 50 MG TB24 24 hr tablet Take 50 mg by mouth at bedtime.     simvastatin (ZOCOR) 20 MG tablet Take 20 mg by mouth every Monday, Wednesday, and Friday at 6 PM.     No current facility-administered medications for this visit.    PHYSICAL EXAMINATION: ECOG PERFORMANCE STATUS: 3 - Symptomatic, >50% confined to bed  Vitals:   10/24/22 1438  BP: 120/73  Pulse: 86  Resp: 16  Temp: 98.6 F (37 C)  SpO2: 99%   Wt Readings from Last 3 Encounters:  10/11/22 143 lb 8 oz (65.1 kg)  09/28/22 158 lb 3.2 oz (71.8 kg)  09/20/22 157 lb 9.6 oz (71.5 kg)     GENERAL:alert, no distress and comfortable SKIN: skin color normal, no rashes or significant lesions EYES: normal, Conjunctiva are pink and non-injected, sclera clear  NEURO: alert & oriented x 3 with fluent speech  LABORATORY DATA:  I have reviewed the data as listed    Latest Ref Rng & Units 10/24/2022    2:15 PM 10/11/2022    1:29 PM 09/28/2022   11:50 AM  CBC  WBC 4.0 - 10.5 K/uL 7.2  7.0  7.4   Hemoglobin 13.0 - 17.0 g/dL 11.8  12.2  12.4   Hematocrit 39.0 - 52.0 % 35.3  35.7  36.3   Platelets 150 - 400 K/uL 165  164  220         Latest Ref Rng & Units 10/24/2022    2:15 PM 10/11/2022    1:29 PM 09/28/2022   11:50 AM  CMP  Glucose 70 - 99 mg/dL 119  123  182   BUN 8 - 23 mg/dL _0 Creatinine 0.61 - 1.24 mg/dL 0.72  0.78  0.80   Sodium 135 - 145 mmol/L 141  137  139   Potassium 3.5 - 5.1 mmol/L 4.7  4.5  3.8   Chloride 98 - 111 mmol/L 104  104  103   CO2 22 - 32 mmol/L _1 Calcium 8.9 - 10.3 mg/dL 9.5  9.3  8.9   Total Protein 6.5 - 8.1 g/dL 6.6  6.6  6.6    Total Bilirubin 0.3 - 1.2 mg/dL 0.8  0.6  0.8   Alkaline Phos 38 - 126 U/L 60  72  76   AST 15 - 41 U/L _2 ALT 0 - 44 U/L 10  7  5  RADIOGRAPHIC STUDIES: I have personally reviewed the radiological images as listed and agreed with the findings in the report. No results found.    No orders of the defined types were placed in this encounter.  All questions were answered. The patient knows to call the clinic with any problems, questions or concerns. No barriers to learning was detected. The total time spent in the appointment was 30 minutes.     Truitt Merle, MD 10/24/2022   I, Wilburn Mylar, am acting as scribe for Truitt Merle, MD.   I have reviewed the above documentation for accuracy and completeness, and I agree with the above.

## 2022-10-25 ENCOUNTER — Encounter (HOSPITAL_COMMUNITY): Payer: Self-pay

## 2022-10-25 ENCOUNTER — Ambulatory Visit (HOSPITAL_COMMUNITY): Admit: 2022-10-25 | Payer: No Typology Code available for payment source | Admitting: Gastroenterology

## 2022-10-25 LAB — CANCER ANTIGEN 19-9: CA 19-9: 936 U/mL — ABNORMAL HIGH (ref 0–35)

## 2022-10-25 SURGERY — UPPER ENDOSCOPIC ULTRASOUND (EUS) RADIAL
Anesthesia: Monitor Anesthesia Care

## 2022-10-28 ENCOUNTER — Other Ambulatory Visit: Payer: Self-pay | Admitting: Neurology

## 2022-11-05 ENCOUNTER — Other Ambulatory Visit: Payer: Self-pay | Admitting: Hematology

## 2022-11-06 ENCOUNTER — Inpatient Hospital Stay: Payer: Medicare Other

## 2022-11-06 ENCOUNTER — Encounter: Payer: Self-pay | Admitting: Hematology

## 2022-11-06 ENCOUNTER — Inpatient Hospital Stay: Payer: Medicare Other | Admitting: Nutrition

## 2022-11-06 ENCOUNTER — Other Ambulatory Visit: Payer: Self-pay

## 2022-11-06 ENCOUNTER — Inpatient Hospital Stay (HOSPITAL_BASED_OUTPATIENT_CLINIC_OR_DEPARTMENT_OTHER): Payer: Medicare Other | Admitting: Hematology

## 2022-11-06 VITALS — BP 133/90 | HR 86 | Temp 98.7°F | Resp 20

## 2022-11-06 VITALS — BP 121/76 | HR 93 | Temp 97.9°F | Resp 16 | Ht 70.0 in | Wt 163.0 lb

## 2022-11-06 DIAGNOSIS — I10 Essential (primary) hypertension: Secondary | ICD-10-CM | POA: Diagnosis not present

## 2022-11-06 DIAGNOSIS — G20A2 Parkinson's disease without dyskinesia, with fluctuations: Secondary | ICD-10-CM

## 2022-11-06 DIAGNOSIS — Z5111 Encounter for antineoplastic chemotherapy: Secondary | ICD-10-CM | POA: Diagnosis not present

## 2022-11-06 DIAGNOSIS — E119 Type 2 diabetes mellitus without complications: Secondary | ICD-10-CM | POA: Diagnosis not present

## 2022-11-06 DIAGNOSIS — C251 Malignant neoplasm of body of pancreas: Secondary | ICD-10-CM

## 2022-11-06 LAB — CBC WITH DIFFERENTIAL (CANCER CENTER ONLY)
Abs Immature Granulocytes: 0.02 10*3/uL (ref 0.00–0.07)
Basophils Absolute: 0 10*3/uL (ref 0.0–0.1)
Basophils Relative: 0 %
Eosinophils Absolute: 0.1 10*3/uL (ref 0.0–0.5)
Eosinophils Relative: 2 %
HCT: 35.9 % — ABNORMAL LOW (ref 39.0–52.0)
Hemoglobin: 12.1 g/dL — ABNORMAL LOW (ref 13.0–17.0)
Immature Granulocytes: 0 %
Lymphocytes Relative: 25 %
Lymphs Abs: 1.8 10*3/uL (ref 0.7–4.0)
MCH: 31.8 pg (ref 26.0–34.0)
MCHC: 33.7 g/dL (ref 30.0–36.0)
MCV: 94.2 fL (ref 80.0–100.0)
Monocytes Absolute: 0.7 10*3/uL (ref 0.1–1.0)
Monocytes Relative: 9 %
Neutro Abs: 4.8 10*3/uL (ref 1.7–7.7)
Neutrophils Relative %: 64 %
Platelet Count: 192 10*3/uL (ref 150–400)
RBC: 3.81 MIL/uL — ABNORMAL LOW (ref 4.22–5.81)
RDW: 17.1 % — ABNORMAL HIGH (ref 11.5–15.5)
WBC Count: 7.5 10*3/uL (ref 4.0–10.5)
nRBC: 0 % (ref 0.0–0.2)

## 2022-11-06 LAB — CMP (CANCER CENTER ONLY)
ALT: 5 U/L (ref 0–44)
AST: 12 U/L — ABNORMAL LOW (ref 15–41)
Albumin: 4.1 g/dL (ref 3.5–5.0)
Alkaline Phosphatase: 62 U/L (ref 38–126)
Anion gap: 10 (ref 5–15)
BUN: 24 mg/dL — ABNORMAL HIGH (ref 8–23)
CO2: 26 mmol/L (ref 22–32)
Calcium: 9.8 mg/dL (ref 8.9–10.3)
Chloride: 101 mmol/L (ref 98–111)
Creatinine: 0.75 mg/dL (ref 0.61–1.24)
GFR, Estimated: >60 mL/min (ref >60)
Glucose, Bld: 138 mg/dL — ABNORMAL HIGH (ref 70–99)
Potassium: 3.9 mmol/L (ref 3.5–5.1)
Sodium: 137 mmol/L (ref 135–145)
Total Bilirubin: 0.8 mg/dL (ref 0.3–1.2)
Total Protein: 6.7 g/dL (ref 6.5–8.1)

## 2022-11-06 MED ORDER — PROCHLORPERAZINE MALEATE 10 MG PO TABS
10.0000 mg | ORAL_TABLET | Freq: Once | ORAL | Status: AC
  Administered 2022-11-06: 10 mg via ORAL
  Filled 2022-11-06: qty 1

## 2022-11-06 MED ORDER — SODIUM CHLORIDE 0.9 % IV SOLN
800.0000 mg/m2 | Freq: Once | INTRAVENOUS | Status: AC
  Administered 2022-11-06: 1444 mg via INTRAVENOUS
  Filled 2022-11-06: qty 37.98

## 2022-11-06 MED ORDER — SODIUM CHLORIDE 0.9 % IV SOLN: Freq: Once | INTRAVENOUS | Status: AC

## 2022-11-06 NOTE — Assessment & Plan Note (Signed)
-  cT2N0M0, stage IB --they opted not to pursue surgery due to comorbidities and overall poor PS -repeat CA 19-9 on 09/20/22 rose to 1,183. -he began single agent gemcitabine on 09/28/22. He experienced more fatigue and loss of appetite after second dose. We discussed decreasing the dose, he agreed and wants to continue chemo for now

## 2022-11-06 NOTE — Progress Notes (Signed)
[place]   Telephone:([phone] Fax:([phone]   Clinic Follow up Note   Patient Care Team: [name], PA as PCP - General (Family Medicine) [place], Heather, Arnold (Optometry) [name], MD as Attending Physician (Neurology) Truitt Merle, MD as Consulting Physician (Hematology) Dwan Bolt, MD as Consulting Physician (General Surgery) Mansouraty, Telford Nab., MD as Consulting Physician (Gastroenterology)  Date of Service:  11/06/2022  CHIEF COMPLAINT: f/u of pancreatic cancer  CURRENT THERAPY:  Gemcitabine, q14d, starting 09/28/22   ASSESSMENT:  Danny [name] is a 71 y.o. male with   Pancreatic cancer ([place]) -cT2N0M0, stage IB --they opted not to pursue surgery due to comorbidities and overall poor PS -repeat CA 19-9 on 09/20/22 rose to 1,183. -he began single agent gemcitabine on 09/28/22. He experienced more fatigue and loss of appetite after second dose. We discussed decreasing the dose, he agreed and wants to continue chemo for now    PLAN: -his CA 19-9 has dropped some since starting treatment -we discussed his worsening fatigue and whether he wants to continue treatment or not. -I recommend repeating a scan to see if he is responding.  -he is scheduled for fiducial placement on 12/13/22 with Dr. Rush Landmark. I will reach out to move this up, and he can move to radiation sooner. -labs reviewed, overall stable. -proceed with gemcitabine today -lab, f/u, and gemcitabine on 12/7 as scheduled -CT to be done around 12/13 with phone visit on 12/15   SUMMARY OF ONCOLOGIC HISTORY: Oncology History  Pancreatic cancer ([place])  07/01/2022 Imaging   EXAM: CT ANGIOGRAPHY CHEST CT ABDOMEN AND PELVIS WITH CONTRAST  IMPRESSION: 1. No evidence of pulmonary embolism or other acute cardiopulmonary process. 2. Mild-moderate gallbladder distension with diffuse gallbladder wall thickening and pericholecystic fluid/stranding, suggestive of acute  cholecystitis. Further evaluation with right upper quadrant ultrasound is recommended. 3. Appearance of the pancreas suggests mild acute uncomplicated pancreatitis. Correlate with serum lipase. There is a short segment of mild pancreatic ductal dilatation within the pancreatic tail. 4. Slightly increased attenuation within the central mesentery with several small mesenteric lymph nodes, which can be seen in the setting of mesenteric panniculitis. 5. Moderate volume of stool within the colon. 6. Aortic and coronary artery atherosclerosis (ICD10-I70.0).   07/02/2022 Procedure   LAPAROSCOPIC CHOLECYSTECTOMY WITH INTRAOPERATIVE CHOLANGIOGRAM, Dr. Ninfa Linden   08/11/2022 Imaging   CLINICAL DATA:  Pancreatic mass  EXAM: MRI ABDOMEN WITHOUT AND WITH CONTRAST  IMPRESSION: 1. Findings suggest focal pancreatitis involving the mid pancreas as seen on comparison CTs. No organized fluid collections. 2. Discrete hypoenhancing lesion in the mid body the pancreas with upstream duct dilatation. While it is unusual for pancreatic adenocarcinoma to present as acute pancreatitis, this differential diagnosis warrants further evaluation. Recommend correlation with CA 19- 9 serum levels and if elevated consider endoscopic ultrasound for evaluation the mid body of the pancreas. If CA 19 -9 normal, recommend follow-up MRI with without contrast after acute pancreatitis subsided to re-evaluate hypoenhancing lesion in the mid body the pancreas. 3. No MRI evidence complication following cholecystectomy.   08/12/2022 Imaging   CLINICAL DATA:  Abdominal pain, acute, nonlocalized   EXAM: CT ABDOMEN AND PELVIS WITH CONTRAST  IMPRESSION: 1. Increase edema/inflammation around the pancreatic head and body, with contiguous inflammatory/edematous changes extending adjacent to the proximal duodenum, ascending colon and hepatic flexure, and into central mesentery suggesting worsening pancreatitis. 2. Fluid in the right  pericolic gutter and pelvis, possibly related to pancreatitis. Bile leak from recent cholecystectomy is considered less  likely possibility. 3. Coronary and Aortic Atherosclerosis (ICD10-170.0).   08/14/2022 Tumor Marker   Patient's tumor was tested for the following markers: CA 19-9. Results of the tumor marker test revealed significant elevation: 587.   09/13/2022 Procedure   Upper EUS, Dr. Rush Landmark  Impression: EGD impression: - No gross lesions in esophagus proximally. - Z-line irregular, 33 cm from the incisors. Salmon-colored mucosa suspicious for long-segment Barrett's esophagus noted distally - biopsied. - 3 cm hiatal hernia. - Erythematous mucosa in the antrum. No gross lesions in the stomach. Biopsied. - No gross lesions in the duodenal bulb, in the first portion of the duodenum and in the second portion of the duodenum. - Normal major papilla.  EUS Impression: - A masslike area was identified in the genu/body of pancreas. Cytology results are pending. However, the endosonographic appearance is highly suspicious for adenocarcinoma. This was staged T2 N0 Mx by endosonographic criteria. The staging applies if malignancy is confirmed. Fine needle biopsy performed. Based on the imaging findings MRI there has been concern that this area was approximately 4 cm in size so T staging would change if that were to be true but I am more suspicious that the area is just a result of recent pancreatitis changes more than truly being a 4 cm mass. - There was no sign of significant pathology in the common bile duct and in the common hepatic duct. - No malignant-appearing lymph nodes were visualized in the celiac region (level 20), peripancreatic region and porta hepatis region.   09/13/2022 Initial Biopsy   A. PANCREAS, BODY, LESION, FINE NEEDLE ASPIRATION:   FINAL MICROSCOPIC DIAGNOSIS:  - Malignant cells consistent with adenocarcinoma    09/13/2022 Cancer Staging   Staging form: Exocrine  Pancreas, AJCC 8th Edition - Clinical stage from 09/13/2022: Stage IB (cT2, cN0, cM0) - Signed by Truitt Merle, MD on 09/20/2022 Stage prefix: Initial diagnosis Total positive nodes: 0   09/20/2022 Initial Diagnosis   Pancreatic cancer ([place])   09/28/2022 -  Chemotherapy   Patient is on Treatment Plan : PANCREAS Gemcitabine D1,8,15 (1000) q28d x 4 Cycles      Genetic Testing   [facility] CancerNext-Expanded Panel was Negative. Report date is 10/04/2022.  The CancerNext-Expanded gene panel offered by [facility] and includes sequencing, rearrangement, and RNA analysis for the following 77 genes: AIP, ALK, APC, ATM, AXIN2, BAP1, BARD1, BLM, BMPR1A, BRCA1, BRCA2, BRIP1, CDC73, CDH1, CDK4, CDKN1B, CDKN2A, CHEK2, CTNNA1, DICER1, FANCC, FH, FLCN, GALNT12, KIF1B, LZTR1, MAX, MEN1, MET, MLH1, MSH2, MSH3, MSH6, MUTYH, NBN, NF1, NF2, NTHL1, PALB2, PHOX2B, PMS2, POT1, PRKAR1A, PTCH1, PTEN, RAD51C, RAD51D, RB1, RECQL, RET, SDHA, SDHAF2, SDHB, SDHC, SDHD, SMAD4, SMARCA4, SMARCB1, SMARCE1, STK11, SUFU, TMEM127, TP53, TSC1, TSC2, VHL and XRCC2 (sequencing and deletion/duplication); EGFR, EGLN1, HOXB13, KIT, MITF, PDGFRA, POLD1, and POLE (sequencing only); EPCAM and GREM1 (deletion/duplication only).        INTERVAL HISTORY:  Danny [name] is here for a follow up of pancreatic cancer. He was last seen by me on 10/24/22. He presents to the clinic accompanied by his wife. She reports he doesn't have any energy. He had fatigue at baseline from Parkinson's, but he reports his energy is now 50% of what is was before treatment. His wife explains he is unable to do anything for himself now. They did not notice a difference in his energy with dose reduction.   All other systems were reviewed with the patient and are negative.  MEDICAL HISTORY:  Past Medical History:  Diagnosis Date  Allergy    SEASONAL   Cataract    BILATERAL-REMOVED   Essential hypertension, benign    PT.DENIES ON AS PREVENTIVE 08/17/19   GERD  (gastroesophageal reflux disease)    Hx of adenomatous polyp of colon 09/04/2019   08/2019 diminutive adenoma No recall given findings and age   Mixed hyperlipidemia    PT.DENIES,STATED ON AS PREVENTIVE 07/30/19   Neuromuscular disorder (HCC)    PARKINSONS   OA (osteoarthritis) of knee    right   Obesity, unspecified    Osteoarthritis    KNEE AND SHOULDERS   Pancreatic cancer ([place]) 09/14/2022   diagnosed   Parkinsons    Type II or unspecified type diabetes mellitus without mention of complication, not stated as uncontrolled     SURGICAL HISTORY: Past Surgical History:  Procedure Laterality Date   BIOPSY  09/13/2022   Procedure: BIOPSY;  Surgeon: Irving Copas., MD;  Location: [facility];  Service: Gastroenterology;;   CATARACT EXTRACTION, BILATERAL     CHOLECYSTECTOMY N/A 07/02/2022   Procedure: LAPAROSCOPIC CHOLECYSTECTOMY WITH ICG DYE  INTRAOPERATIVE CHOLANGIOGRAM;  Surgeon: Coralie Keens, MD;  Location: [facility];  Service: General;  Laterality: N/A;   ESOPHAGOGASTRODUODENOSCOPY N/A 09/13/2022   Procedure: ESOPHAGOGASTRODUODENOSCOPY (EGD);  Surgeon: Irving Copas., MD;  Location: [facility];  Service: Gastroenterology;  Laterality: N/A;   EUS N/A 09/13/2022   Procedure: UPPER ENDOSCOPIC ULTRASOUND (EUS) RADIAL;  Surgeon: Rush Landmark Telford Nab., MD;  Location: [facility];  Service: Gastroenterology;  Laterality: N/A;   FINE NEEDLE ASPIRATION  09/13/2022   Procedure: FINE NEEDLE ASPIRATION (FNA) LINEAR;  Surgeon: Irving Copas., MD;  Location: [facility];  Service: Gastroenterology;;   GANGLION CYST EXCISION  age 50 years   right   KNEE ARTHROSCOPY     right   KNEE ARTHROSCOPY     TOTAL KNEE ARTHROPLASTY Right 03/08/2020   Procedure: RIGHT TOTAL KNEE ARTHROPLASTY;  Surgeon: Garald Balding, MD;  Location: [facility];  Service: Orthopedics;  Laterality: Right;   WRIST FRACTURE SURGERY     WRIST SURGERY  04/2012    I have reviewed the social  history and family history with the patient and they are unchanged from previous note.  ALLERGIES:  is allergic to codeine.  MEDICATIONS:  Current Outpatient Medications  Medication Sig Dispense Refill   amLODipine (NORVASC) 10 MG tablet Take 1 tablet (10 mg total) by mouth daily. (Patient not taking: Reported on 09/20/2022) 30 tablet 0   ascorbic acid (VITAMIN C) 500 MG tablet Take 500 mg by mouth in the morning.     aspirin 81 MG chewable tablet Take 81 mg by mouth daily. Swallow whole.     carbidopa-levodopa (SINEMET CR) 50-200 MG tablet TAKE 1 TABLET BY MOUTH EVERYDAY AT BEDTIME 90 tablet 3   carbidopa-levodopa (SINEMET IR) 25-100 MG tablet TAKE 1 TABLET BY MOUTH 6 (SIX) TIMES DAILY. (Patient taking differently: Take 1 tablet by mouth at bedtime.) 540 tablet 3   Cinnamon 500 MG capsule Take 500 mg by mouth daily.     famotidine (PEPCID) 20 MG tablet Take 20 mg by mouth in the morning and at bedtime.      fludrocortisone (FLORINEF) 0.1 MG tablet TAKE 0.5 TABLETS BY MOUTH DAILY. 45 tablet 0   Glucosamine-Chondroitin (COSAMIN DS PO) Take 1 tablet by mouth in the morning.     lidocaine-prilocaine (EMLA) cream Apply to affected area once 30 g 3   metFORMIN (GLUCOPHAGE) 1000 MG tablet Take 1 tablet (1,000 mg total) by mouth  2 (two) times daily with a meal. 20 tablet 0   Multiple Vitamin (MULTIVITAMIN) tablet Take 1 tablet by mouth daily with breakfast.     ondansetron (ZOFRAN) 8 MG tablet Take 1 tablet (8 mg total) by mouth every 8 (eight) hours as needed for nausea or vomiting. 30 tablet 1   polyethylene glycol (MIRALAX / GLYCOLAX) 17 g packet Take 17 g by mouth every evening.     prochlorperazine (COMPAZINE) 10 MG tablet Take 1 tablet (10 mg total) by mouth every 6 (six) hours as needed for nausea or vomiting. 30 tablet 1   QUEtiapine (SEROQUEL XR) 50 MG TB24 24 hr tablet Take 50 mg by mouth at bedtime.     simvastatin (ZOCOR) 20 MG tablet Take 20 mg by mouth every Monday, Wednesday, and Friday  at 6 PM.     No current facility-administered medications for this visit.    PHYSICAL EXAMINATION: ECOG PERFORMANCE STATUS: 3 - Symptomatic, >50% confined to bed  Vitals:   11/06/22 1433  BP: 121/76  Pulse: 93  Resp: 16  Temp: 97.9 F (36.6 C)  SpO2: 99%   Wt Readings from Last 3 Encounters:  11/06/22 163 lb (73.9 kg)  10/11/22 143 lb 8 oz (65.1 kg)  09/28/22 158 lb 3.2 oz (71.8 kg)     GENERAL:alert, no distress and comfortable SKIN: skin color normal, no rashes or significant lesions EYES: normal, Conjunctiva are pink and non-injected, sclera clear  NEURO: alert & oriented x 3 with fluent speech  LABORATORY DATA:  I have reviewed the data as listed    Latest Ref Rng & Units 11/06/2022    2:04 PM 10/24/2022    2:15 PM 10/11/2022    1:29 PM  CBC  WBC 4.0 - 10.5 K/uL 7.5  7.2  7.0   Hemoglobin 13.0 - 17.0 g/dL 12.1  11.8  12.2   Hematocrit 39.0 - 52.0 % 35.9  35.3  35.7   Platelets 150 - 400 K/uL 192  165  164         Latest Ref Rng & Units 11/06/2022    2:04 PM 10/24/2022    2:15 PM 10/11/2022    1:29 PM  CMP  Glucose 70 - 99 mg/dL 138  119  123   BUN 8 - 23 mg/dL _0 Creatinine 0.61 - 1.24 mg/dL 0.75  0.72  0.78   Sodium 135 - 145 mmol/L 137  141  137   Potassium 3.5 - 5.1 mmol/L 3.9  4.7  4.5   Chloride 98 - 111 mmol/L 101  104  104   CO2 22 - 32 mmol/L _1 Calcium 8.9 - 10.3 mg/dL 9.8  9.5  9.3   Total Protein 6.5 - 8.1 g/dL 6.7  6.6  6.6   Total Bilirubin 0.3 - 1.2 mg/dL 0.8  0.8  0.6   Alkaline Phos 38 - 126 U/L 62  60  72   AST 15 - 41 U/L _2 ALT 0 - 44 U/L _3 RADIOGRAPHIC STUDIES: I have personally reviewed the radiological images as listed and agreed with the findings in the report. No results found.    Orders Placed This Encounter  Procedures   CT ABD PELVIS W/WO CM ONCOLOGY PANCREATIC PROTOCOL    Standing Status:   Future    Standing Expiration Date:   11/07/2023  Order Specific Question:    If indicated for the ordered procedure, I authorize the administration of contrast media per Radiology protocol    Answer:   Yes    Order Specific Question:   Does the patient have a contrast media/X-ray dye allergy?    Answer:   No    Order Specific Question:   Preferred imaging location?    Answer:   [facility]    Order Specific Question:   Release to patient    Answer:   Immediate [1]    Order Specific Question:   Is Oral Contrast requested for this exam?    Answer:   Yes, Per Radiology protocol   All questions were answered. The patient knows to call the clinic with any problems, questions or concerns. No barriers to learning was detected. The total time spent in the appointment was 30 minutes.     Truitt Merle, MD 11/06/2022   I, Wilburn Mylar, am acting as scribe for Truitt Merle, MD.   I have reviewed the above documentation for accuracy and completeness, and I agree with the above.

## 2022-11-06 NOTE — Progress Notes (Signed)
Nutrition follow-up completed with patient and wife.  Patient receiving gemcitabine for pancreatic cancer.  Weight improved and was documented as 163 pounds today, increased from 157.6 pounds October 5.  No edema noted.  Patient's wife reported patient is eating well.  She is giving Premier protein twice daily.  Patient is drinking at least 60 ounces of water every day.  He was constipated for approximately 5 days.  Wife gave MiraLAX which then produced 4 days of loose stool.  Patient is now having regular bowel movements.  Neither patient nor wife have any concerns related to nutrition today.  Nutrition diagnosis: Unintended weight loss improved.  Intervention: Provided support and encouragement. Patient can continue Premier protein twice daily for now.  If p.o. intake drops, patient will need to switch to Ensure complete for additional calories.  Wife reports understanding.  Monitoring, evaluation, goals: Patient will tolerate adequate calories and protein to minimize weight loss.  Next visit: To be scheduled as needed.  **Disclaimer: This note was dictated with voice recognition software. Similar sounding words can inadvertently be transcribed and this note may contain transcription errors which may not have been corrected upon publication of note.**

## 2022-11-06 NOTE — Patient Instructions (Signed)
[facility]  Discharge Instructions: Thank you for choosing [facility] to provide your oncology and hematology care.   If you have a lab appointment with the Cancer Center, please go directly to the Cancer Center and check in at the registration area.   Wear comfortable clothing and clothing appropriate for easy access to any Portacath or PICC line.   We strive to give you quality time with your provider. You may need to reschedule your appointment if you arrive late (15 or more minutes).  Arriving late affects you and other patients whose appointments are after yours.  Also, if you miss three or more appointments without notifying the office, you may be dismissed from the clinic at the provider's discretion.      For prescription refill requests, have your pharmacy contact our office and allow 72 hours for refills to be completed.    Today you received the following chemotherapy and/or immunotherapy agents: Gemzar      To help prevent nausea and vomiting after your treatment, we encourage you to take your nausea medication as directed.  BELOW ARE SYMPTOMS THAT SHOULD BE REPORTED IMMEDIATELY: *FEVER GREATER THAN 100.4 F (38 C) OR HIGHER *CHILLS OR SWEATING *NAUSEA AND VOMITING THAT IS NOT CONTROLLED WITH YOUR NAUSEA MEDICATION *UNUSUAL SHORTNESS OF BREATH *UNUSUAL BRUISING OR BLEEDING *URINARY PROBLEMS (pain or burning when urinating, or frequent urination) *BOWEL PROBLEMS (unusual diarrhea, constipation, pain near the anus) TENDERNESS IN MOUTH AND THROAT WITH OR WITHOUT PRESENCE OF ULCERS (sore throat, sores in mouth, or a toothache) UNUSUAL RASH, SWELLING OR PAIN  UNUSUAL VAGINAL DISCHARGE OR ITCHING   Items with * indicate a potential emergency and should be followed up as soon as possible or go to the Emergency Department if any problems should occur.  Please show the CHEMOTHERAPY ALERT CARD or IMMUNOTHERAPY ALERT CARD at check-in to the  Emergency Department and triage nurse.  Should you have questions after your visit or need to cancel or reschedule your appointment, please contact [facility]  Dept: [phone]  and follow the prompts.  Office hours are 8:00 a.m. to 4:30 p.m. Monday - Friday. Please note that voicemails left after 4:00 p.m. may not be returned until the following business day.  We are closed weekends and major holidays. You have access to a nurse at all times for urgent questions. Please call the main number to the clinic Dept: [phone] and follow the prompts.   For any non-urgent questions, you may also contact your provider using MyChart. We now offer e-Visits for anyone 18 and older to request care online for non-urgent symptoms. For details visit [email or web address].   Also download the MyChart app! Go to the app store, search "MyChart", open the app, select [facility], and log in with your MyChart username and password.  Masks are optional in the cancer centers. If you would like for your care team to wear a mask while they are taking care of you, please let them know. You may have one support person who is at least 71 years old accompany you for your appointments. 

## 2022-11-07 ENCOUNTER — Telehealth: Payer: Self-pay | Admitting: Hematology

## 2022-11-09 ENCOUNTER — Other Ambulatory Visit: Payer: Medicare Other | Admitting: Student

## 2022-11-09 DIAGNOSIS — Z515 Encounter for palliative care: Secondary | ICD-10-CM

## 2022-11-09 DIAGNOSIS — G20B2 Parkinson's disease with dyskinesia, with fluctuations: Secondary | ICD-10-CM

## 2022-11-09 DIAGNOSIS — C251 Malignant neoplasm of body of pancreas: Secondary | ICD-10-CM

## 2022-11-09 DIAGNOSIS — R531 Weakness: Secondary | ICD-10-CM

## 2022-11-09 NOTE — Progress Notes (Signed)
[facility] Consult Note Telephone: [phone]  Fax: [phone]    Date of encounter: 11/09/22 10:10 AM PATIENT NAME: Danny [name] [address] [place] [address]   [phone] (home)  DOB: [birth year] MRN: [id number] PRIMARY CARE PROVIDER:    [name], [place],  [address] [place] [address] [phone]  REFERRING PROVIDER:   [name], [address],  [place] [address] [phone]  RESPONSIBLE PARTY:    Contact Information     Name Relation Home Work [place] Spouse [phone]  [phone]        I met face to face with patient and family in the home. Palliative Care was asked to follow this patient by consultation request of  [name], PA to address advance care planning and complex medical decision making. This is a follow up visit.                                   ASSESSMENT AND PLAN / RECOMMENDATIONS:   Advance Care Planning/Goals of Care: Goals include to maximize quality of life and symptom management. Patient/health care surrogate gave his/her permission to discuss. Our advance care planning conversation included a discussion about:    The value and importance of advance care planning  Experiences with loved ones who have been seriously ill or have died  Exploration of personal, cultural or spiritual beliefs that might influence medical decisions  Exploration of goals of care in the event of a sudden injury or illness  Review and updating or creation of an  advance directive document . CODE STATUS: DNR  Education provided on Palliative Medicine vs. Hospice. Patient wishes to continue chemotherapy. We discussed transitioning to hospice when no longer receiving aggressive treatment.  Symptom Management/Plan:  Pancreatic cancer-currently receiving chemotherapy. He endorses fatigue after 2nd and 3rd treatments;  dosage had been decreased. Possibility of radiation after next CT scan. Will follow up with oncology as scheduled.   Danny [name] continues to receive home health therapy. Some days he is weaker, difficulty transferring. He is ambulating short distances with therapy. Discussed mechanical lift should he decline further.   Parkinson's disease-patient with increased weakness, stiffness, cognitive changes. Family to continue providing supportive care. Continue carbidopa-levodopa as directed. Continue HH PT, ST, SN as directed.   Follow up Palliative Care Visit: Palliative care will continue to follow for complex medical decision making, advance care planning, and clarification of goals. Return 4 weeks or prn.  I spent 60 minutes providing this consultation. More than 50% of the time in this consultation was spent in counseling and care coordination.   PPS: 40%  HOSPICE ELIGIBILITY/DIAGNOSIS: TBD  Chief Complaint: Palliative Medicine follow up visit.   HISTORY OF PRESENT ILLNESS:  Danny [name] is a 71 y.o. year old male  with  Pancreatic cancer, Parkinson's, debility, hypertension, hypotension, hyperlipidemia, T2DM.    Receiving chemotherapy at reduced dosage; last tx on 11/21. Next tx on 11/22/22. Plans for radiation; to have f/u CT scan. Denies pain, shortness of breath. Endorses fatigue, weakness. Currently receiving HH therapy. Ambulating short distances. Appetite has been fair; drinking nutritional supplements, 2-3 a day. Blood sugar checked once a day. Weight is back up to 159 pounds. Patient requiring more cueing, redirection. Denies worsening swallowing difficulty; he is working with speech therapy.   History obtained from review of EMR,  discussion with primary team, and interview with family, facility staff/caregiver and/or Mr. Strawderman.  I reviewed available labs, medications, imaging, studies and related documents from the EMR.  Records reviewed and summarized above.    ROS  10-point ROS is negative, except for the pertinent positives and negatives detailed per the HPI   Physical Exam: Pulse  88, resp 16, bp 96/58, sats 97 % on room air 98%  Weight 159 pounds Constitutional: NAD General: frail appearing, thin EYES: anicteric sclera, lids intact, no discharge  ENMT: intact hearing, oral mucous membranes moist, dentition intact CV: S1S2, RRR, no LE edema Pulmonary: LCTA, no increased work of breathing, no cough, room air Abdomen: normo-active BS + 4 quadrants, soft and non tender, no ascites GU: deferred MSK: +sarcopenia, moves all extremities, ambulatory Skin: warm and dry, no rashes or wounds on visible skin Neuro: + generalized weakness, speech mumbled, low in tone Psych: non-anxious affect, A and O x 3 Hem/lymph/immuno: no widespread bruising   Thank you for the opportunity to participate in the care of Mr. Hayashi. Please call our office at [phone] if we can be of additional assistance.   Ezekiel Slocumb, NP   COVID-19 PATIENT SCREENING TOOL Asked and negative response unless otherwise noted:   Have you had symptoms of covid, tested positive or been in contact with someone with symptoms/positive test in the past 5-10 days? No

## 2022-11-22 ENCOUNTER — Inpatient Hospital Stay: Payer: Medicare Other

## 2022-11-22 ENCOUNTER — Inpatient Hospital Stay (HOSPITAL_BASED_OUTPATIENT_CLINIC_OR_DEPARTMENT_OTHER): Payer: Medicare Other | Admitting: Hematology

## 2022-11-22 ENCOUNTER — Inpatient Hospital Stay: Payer: Medicare Other | Attending: Hematology

## 2022-11-22 ENCOUNTER — Encounter: Payer: Self-pay | Admitting: Hematology

## 2022-11-22 ENCOUNTER — Other Ambulatory Visit: Payer: Self-pay

## 2022-11-22 VITALS — BP 122/72 | HR 91 | Temp 98.3°F | Resp 18 | Ht 70.0 in | Wt 159.0 lb

## 2022-11-22 DIAGNOSIS — C251 Malignant neoplasm of body of pancreas: Secondary | ICD-10-CM | POA: Diagnosis present

## 2022-11-22 DIAGNOSIS — G20A2 Parkinson's disease without dyskinesia, with fluctuations: Secondary | ICD-10-CM | POA: Diagnosis not present

## 2022-11-22 DIAGNOSIS — Z5111 Encounter for antineoplastic chemotherapy: Secondary | ICD-10-CM | POA: Diagnosis present

## 2022-11-22 LAB — CMP (CANCER CENTER ONLY)
ALT: 10 U/L (ref 0–44)
AST: 14 U/L — ABNORMAL LOW (ref 15–41)
Albumin: 3.9 g/dL (ref 3.5–5.0)
Alkaline Phosphatase: 59 U/L (ref 38–126)
Anion gap: 11 (ref 5–15)
BUN: 23 mg/dL (ref 8–23)
CO2: 24 mmol/L (ref 22–32)
Calcium: 9.7 mg/dL (ref 8.9–10.3)
Chloride: 103 mmol/L (ref 98–111)
Creatinine: 0.69 mg/dL (ref 0.61–1.24)
GFR, Estimated: >60 mL/min (ref >60)
Glucose, Bld: 136 mg/dL — ABNORMAL HIGH (ref 70–99)
Potassium: 4.4 mmol/L (ref 3.5–5.1)
Sodium: 138 mmol/L (ref 135–145)
Total Bilirubin: 0.8 mg/dL (ref 0.3–1.2)
Total Protein: 6.4 g/dL — ABNORMAL LOW (ref 6.5–8.1)

## 2022-11-22 LAB — CBC WITH DIFFERENTIAL (CANCER CENTER ONLY)
Abs Immature Granulocytes: 0.03 10*3/uL (ref 0.00–0.07)
Basophils Absolute: 0.1 10*3/uL (ref 0.0–0.1)
Basophils Relative: 1 %
Eosinophils Absolute: 0.4 10*3/uL (ref 0.0–0.5)
Eosinophils Relative: 6 %
HCT: 37 % — ABNORMAL LOW (ref 39.0–52.0)
Hemoglobin: 12.4 g/dL — ABNORMAL LOW (ref 13.0–17.0)
Immature Granulocytes: 0 %
Lymphocytes Relative: 20 %
Lymphs Abs: 1.6 10*3/uL (ref 0.7–4.0)
MCH: 32 pg (ref 26.0–34.0)
MCHC: 33.5 g/dL (ref 30.0–36.0)
MCV: 95.4 fL (ref 80.0–100.0)
Monocytes Absolute: 0.5 10*3/uL (ref 0.1–1.0)
Monocytes Relative: 6 %
Neutro Abs: 5.3 10*3/uL (ref 1.7–7.7)
Neutrophils Relative %: 67 %
Platelet Count: 225 10*3/uL (ref 150–400)
RBC: 3.88 MIL/uL — ABNORMAL LOW (ref 4.22–5.81)
RDW: 17.3 % — ABNORMAL HIGH (ref 11.5–15.5)
WBC Count: 7.9 10*3/uL (ref 4.0–10.5)
nRBC: 0 % (ref 0.0–0.2)

## 2022-11-22 MED ORDER — SODIUM CHLORIDE 0.9 % IV SOLN
800.0000 mg/m2 | Freq: Once | INTRAVENOUS | Status: AC
  Administered 2022-11-22: 1444 mg via INTRAVENOUS
  Filled 2022-11-22: qty 37.98

## 2022-11-22 MED ORDER — SODIUM CHLORIDE 0.9 % IV SOLN: Freq: Once | INTRAVENOUS | Status: DC

## 2022-11-22 MED ORDER — PROCHLORPERAZINE MALEATE 10 MG PO TABS
10.0000 mg | ORAL_TABLET | Freq: Once | ORAL | Status: AC
  Administered 2022-11-22: 10 mg via ORAL
  Filled 2022-11-22: qty 1

## 2022-11-22 MED ORDER — SODIUM CHLORIDE 0.9 % IV SOLN: Freq: Once | INTRAVENOUS | Status: AC

## 2022-11-22 NOTE — Progress Notes (Signed)
[place]   [phone] Fax:([phone]) [phone]   Clinic Follow up Note   Patient Care Team: [name], PA as PCP - General (Family Medicine) [place], [place], [place] (Optometry) [name], MD as Attending Physician (Neurology) Truitt Merle, MD as Consulting Physician (Hematology) Dwan Bolt, MD as Consulting Physician (General Surgery) Mansouraty, Telford Nab., MD as Consulting Physician (Gastroenterology)  Date of Service:  11/22/2022  CHIEF COMPLAINT: f/u of pancreatic cancer   CURRENT THERAPY:  Gemcitabine, q14d, starting 09/28/22   ASSESSMENT:  Danny [name] is a 71 y.o. male with   Pancreatic cancer ([facility]) cT2N0M0, stage IB --they opted not to pursue surgery due to comorbidities and overall poor PS -repeat CA 19-9 on 09/20/22 rose to 1,183. -he began single agent gemcitabine on 09/28/22. He experienced more fatigue and loss of appetite after second dose. We discussed decreasing the dose, he agreed and wants to continue chemo for now   -plan to have consolidation radiation after chemo if no progression  -he is scheduled for fiducial placement on 12/13/22 with Dr. Rush Landmark.   -restaging CT scheduled for 12/12    PLAN: - Lab reviewed. -Tumor Marker was done today, result pending  -proceed with C3D1 Gemcitabine today, plan to stop after this dose if next CT scan no cancer progression  -CT to be done around 12/13 with phone visit on 12/15, will refer him to [place] if no cancer progression    SUMMARY OF ONCOLOGIC HISTORY: Oncology History  Pancreatic cancer ([place])  07/01/2022 Imaging   EXAM: CT ANGIOGRAPHY CHEST CT ABDOMEN AND PELVIS WITH CONTRAST  IMPRESSION: 1. No evidence of pulmonary embolism or other acute cardiopulmonary process. 2. Mild-moderate gallbladder distension with diffuse gallbladder wall thickening and pericholecystic fluid/stranding, suggestive of acute cholecystitis. Further evaluation with right upper  quadrant ultrasound is recommended. 3. Appearance of the pancreas suggests mild acute uncomplicated pancreatitis. Correlate with serum lipase. There is a short segment of mild pancreatic ductal dilatation within the pancreatic tail. 4. Slightly increased attenuation within the central mesentery with several small mesenteric lymph nodes, which can be seen in the setting of mesenteric panniculitis. 5. Moderate volume of stool within the colon. 6. Aortic and coronary artery atherosclerosis (ICD10-I70.0).   07/02/2022 Procedure   LAPAROSCOPIC CHOLECYSTECTOMY WITH INTRAOPERATIVE CHOLANGIOGRAM, Dr. Ninfa Linden   08/11/2022 Imaging   CLINICAL DATA:  Pancreatic mass  EXAM: MRI ABDOMEN WITHOUT AND WITH CONTRAST  IMPRESSION: 1. Findings suggest focal pancreatitis involving the mid pancreas as seen on comparison CTs. No organized fluid collections. 2. Discrete hypoenhancing lesion in the mid body the pancreas with upstream duct dilatation. While it is unusual for pancreatic adenocarcinoma to present as acute pancreatitis, this differential diagnosis warrants further evaluation. Recommend correlation with CA 19- 9 serum levels and if elevated consider endoscopic ultrasound for evaluation the mid body of the pancreas. If CA 19 -9 normal, recommend follow-up MRI with without contrast after acute pancreatitis subsided to re-evaluate hypoenhancing lesion in the mid body the pancreas. 3. No MRI evidence complication following cholecystectomy.   08/12/2022 Imaging   CLINICAL DATA:  Abdominal pain, acute, nonlocalized   EXAM: CT ABDOMEN AND PELVIS WITH CONTRAST  IMPRESSION: 1. Increase edema/inflammation around the pancreatic head and body, with contiguous inflammatory/edematous changes extending adjacent to the proximal duodenum, ascending colon and hepatic flexure, and into central mesentery suggesting worsening pancreatitis. 2. Fluid in the right pericolic gutter and pelvis, possibly related to  pancreatitis. Bile leak from recent cholecystectomy is considered less likely possibility. 3. Coronary and  Aortic Atherosclerosis (ICD10-170.0).   08/14/2022 Tumor Marker   Patient's tumor was tested for the following markers: CA 19-9. Results of the tumor marker test revealed significant elevation: 587.   09/13/2022 Procedure   Upper EUS, Dr. Rush Landmark  Impression: EGD impression: - No gross lesions in esophagus proximally. - Z-line irregular, 33 cm from the incisors. Salmon-colored mucosa suspicious for long-segment Barrett's esophagus noted distally - biopsied. - 3 cm hiatal hernia. - Erythematous mucosa in the antrum. No gross lesions in the stomach. Biopsied. - No gross lesions in the duodenal bulb, in the first portion of the duodenum and in the second portion of the duodenum. - Normal major papilla.  EUS Impression: - A masslike area was identified in the genu/body of pancreas. Cytology results are pending. However, the endosonographic appearance is highly suspicious for adenocarcinoma. This was staged T2 N0 Mx by endosonographic criteria. The staging applies if malignancy is confirmed. Fine needle biopsy performed. Based on the imaging findings MRI there has been concern that this area was approximately 4 cm in size so T staging would change if that were to be true but I am more suspicious that the area is just a result of recent pancreatitis changes more than truly being a 4 cm mass. - There was no sign of significant pathology in the common bile duct and in the common hepatic duct. - No malignant-appearing lymph nodes were visualized in the celiac region (level 20), peripancreatic region and porta hepatis region.   09/13/2022 Initial Biopsy   A. PANCREAS, BODY, LESION, FINE NEEDLE ASPIRATION:   FINAL MICROSCOPIC DIAGNOSIS:  - Malignant cells consistent with adenocarcinoma    09/13/2022 Cancer Staging   Staging form: Exocrine Pancreas, AJCC 8th Edition - Clinical stage from  09/13/2022: Stage IB (cT2, cN0, cM0) - Signed by Truitt Merle, MD on 09/20/2022 Stage prefix: Initial diagnosis Total positive nodes: 0   09/20/2022 Initial Diagnosis   Pancreatic cancer ([name])   09/28/2022 -  Chemotherapy   Patient is on Treatment Plan : PANCREAS Gemcitabine D1,8,15 (1000) q28d x 4 Cycles      Genetic Testing   [facility] CancerNext-Expanded Panel was Negative. Report date is 10/04/2022.  The CancerNext-Expanded gene panel offered by [facility] and includes sequencing, rearrangement, and RNA analysis for the following 77 genes: AIP, ALK, APC, ATM, AXIN2, BAP1, BARD1, BLM, BMPR1A, BRCA1, BRCA2, BRIP1, CDC73, CDH1, CDK4, CDKN1B, CDKN2A, CHEK2, CTNNA1, DICER1, FANCC, FH, FLCN, GALNT12, KIF1B, LZTR1, MAX, MEN1, MET, MLH1, MSH2, MSH3, MSH6, MUTYH, NBN, NF1, NF2, NTHL1, PALB2, PHOX2B, PMS2, POT1, PRKAR1A, PTCH1, PTEN, RAD51C, RAD51D, RB1, RECQL, RET, SDHA, SDHAF2, SDHB, SDHC, SDHD, SMAD4, SMARCA4, SMARCB1, SMARCE1, STK11, SUFU, TMEM127, TP53, TSC1, TSC2, VHL and XRCC2 (sequencing and deletion/duplication); EGFR, EGLN1, HOXB13, KIT, MITF, PDGFRA, POLD1, and POLE (sequencing only); EPCAM and GREM1 (deletion/duplication only).        INTERVAL HISTORY:  TEJAY HUBERT is here for a follow up of  pancreatic cancer  He was last seen by me on 11/06/2022 He presents to the clinic accompanied by wife. Pt  wife states that had a sharp pain in his side., he experiencing a lot of fatigue.     All other systems were reviewed with the patient and are negative.  MEDICAL HISTORY:  Past Medical History:  Diagnosis Date   Allergy    SEASONAL   Cataract    BILATERAL-REMOVED   Essential hypertension, benign    PT.DENIES ON AS PREVENTIVE 08/17/19   GERD (gastroesophageal reflux disease)    Hx  of adenomatous polyp of colon 09/04/2019   08/2019 diminutive adenoma No recall given findings and age   Mixed hyperlipidemia    PT.DENIES,STATED ON AS PREVENTIVE 07/30/19   Neuromuscular disorder ([facility]CC)     PARKINSONS   OA (osteoarthritis) of knee    right   Obesity, unspecified    Osteoarthritis    KNEE AND SHOULDERS   Pancreatic cancer ([place]) 09/14/2022   diagnosed   Parkinsons    Type II or unspecified type diabetes mellitus without mention of complication, not stated as uncontrolled     SURGICAL HISTORY: Past Surgical History:  Procedure Laterality Date   BIOPSY  09/13/2022   Procedure: BIOPSY;  Surgeon: Irving Copas., MD;  Location: [facility];  Service: Gastroenterology;;   CATARACT EXTRACTION, BILATERAL     CHOLECYSTECTOMY N/A 07/02/2022   Procedure: LAPAROSCOPIC CHOLECYSTECTOMY WITH ICG DYE  INTRAOPERATIVE CHOLANGIOGRAM;  Surgeon: Coralie Keens, MD;  Location: [facility];  Service: General;  Laterality: N/A;   ESOPHAGOGASTRODUODENOSCOPY N/A 09/13/2022   Procedure: ESOPHAGOGASTRODUODENOSCOPY (EGD);  Surgeon: Irving Copas., MD;  Location: [facility];  Service: Gastroenterology;  Laterality: N/A;   EUS N/A 09/13/2022   Procedure: UPPER ENDOSCOPIC ULTRASOUND (EUS) RADIAL;  Surgeon: Rush Landmark Telford Nab., MD;  Location: [facility];  Service: Gastroenterology;  Laterality: N/A;   FINE NEEDLE ASPIRATION  09/13/2022   Procedure: FINE NEEDLE ASPIRATION (FNA) LINEAR;  Surgeon: Irving Copas., MD;  Location: [facility];  Service: Gastroenterology;;   GANGLION CYST EXCISION  age 40 years   right   KNEE ARTHROSCOPY     right   KNEE ARTHROSCOPY     TOTAL KNEE ARTHROPLASTY Right 03/08/2020   Procedure: RIGHT TOTAL KNEE ARTHROPLASTY;  Surgeon: Garald Balding, MD;  Location: [facility];  Service: Orthopedics;  Laterality: Right;   WRIST FRACTURE SURGERY     WRIST SURGERY  04/2012    I have reviewed the social history and family history with the patient and they are unchanged from previous note.  ALLERGIES:  is allergic to codeine.  MEDICATIONS:  Current Outpatient Medications  Medication Sig Dispense Refill   amLODipine (NORVASC) 10 MG tablet Take 1  tablet (10 mg total) by mouth daily. (Patient not taking: Reported on 09/20/2022) 30 tablet 0   ascorbic acid (VITAMIN C) 500 MG tablet Take 500 mg by mouth in the morning.     aspirin 81 MG chewable tablet Take 81 mg by mouth daily. Swallow whole.     carbidopa-levodopa (SINEMET CR) 50-200 MG tablet TAKE 1 TABLET BY MOUTH EVERYDAY AT BEDTIME 90 tablet 3   carbidopa-levodopa (SINEMET IR) 25-100 MG tablet TAKE 1 TABLET BY MOUTH 6 (SIX) TIMES DAILY. (Patient taking differently: Take 1 tablet by mouth at bedtime.) 540 tablet 3   Cinnamon 500 MG capsule Take 500 mg by mouth daily.     famotidine (PEPCID) 20 MG tablet Take 20 mg by mouth in the morning and at bedtime.      fludrocortisone (FLORINEF) 0.1 MG tablet TAKE 0.5 TABLETS BY MOUTH DAILY. 45 tablet 0   Glucosamine-Chondroitin (COSAMIN DS PO) Take 1 tablet by mouth in the morning.     lidocaine-prilocaine (EMLA) cream Apply to affected area once 30 g 3   metFORMIN (GLUCOPHAGE) 1000 MG tablet Take 1 tablet (1,000 mg total) by mouth 2 (two) times daily with a meal. 20 tablet 0   Multiple Vitamin (MULTIVITAMIN) tablet Take 1 tablet by mouth daily with breakfast.     ondansetron (ZOFRAN) 8 MG tablet Take 1  tablet (8 mg total) by mouth every 8 (eight) hours as needed for nausea or vomiting. 30 tablet 1   polyethylene glycol (MIRALAX / GLYCOLAX) 17 g packet Take 17 g by mouth every evening.     prochlorperazine (COMPAZINE) 10 MG tablet Take 1 tablet (10 mg total) by mouth every 6 (six) hours as needed for nausea or vomiting. 30 tablet 1   QUEtiapine (SEROQUEL XR) 50 MG TB24 24 hr tablet Take 50 mg by mouth at bedtime.     simvastatin (ZOCOR) 20 MG tablet Take 20 mg by mouth every Monday, Wednesday, and Friday at 6 PM.     No current facility-administered medications for this visit.   Facility-Administered Medications Ordered in Other Visits  Medication Dose Route Frequency Provider Last Rate Last Admin   0.9 %  sodium chloride infusion   Intravenous  Once Truitt Merle, MD       gemcitabine (GEMZAR) 1,444 mg in sodium chloride 0.9 % 250 mL chemo infusion  800 mg/m2 (Treatment Plan Recorded) Intravenous Once Truitt Merle, MD 576 mL/hr at 11/22/22 1510 1,444 mg at 11/22/22 1510    PHYSICAL EXAMINATION: ECOG PERFORMANCE STATUS: 3 - Symptomatic, >50% confined to bed  Vitals:   11/22/22 1411  BP: 122/72  Pulse: 91  Resp: 18  Temp: 98.3 F (36.8 C)  SpO2: 100%   Wt Readings from Last 3 Encounters:  11/22/22 159 lb (72.1 kg)  11/06/22 163 lb (73.9 kg)  10/11/22 143 lb 8 oz (65.1 kg)     GENERAL:alert, no distress and comfortable SKIN: skin color normal, no rashes or significant lesions EYES: normal, Conjunctiva are pink and non-injected, sclera clear  NEURO: alert & oriented x 3 with fluent speech  LABORATORY DATA:  I have reviewed the data as listed    Latest Ref Rng & Units 11/22/2022    1:22 PM 11/06/2022    2:04 PM 10/24/2022    2:15 PM  CBC  WBC 4.0 - 10.5 K/uL 7.9  7.5  7.2   Hemoglobin 13.0 - 17.0 g/dL 12.4  12.1  11.8   Hematocrit 39.0 - 52.0 % 37.0  35.9  35.3   Platelets 150 - 400 K/uL 225  192  165         Latest Ref Rng & Units 11/22/2022    1:22 PM 11/06/2022    2:04 PM 10/24/2022    2:15 PM  CMP  Glucose 70 - 99 mg/dL 136  138  119   BUN 8 - 23 mg/dL _0 Creatinine 0.61 - 1.24 mg/dL 0.69  0.75  0.72   Sodium 135 - 145 mmol/L 138  137  141   Potassium 3.5 - 5.1 mmol/L 4.4  3.9  4.7   Chloride 98 - 111 mmol/L 103  101  104   CO2 22 - 32 mmol/L _1 Calcium 8.9 - 10.3 mg/dL 9.7  9.8  9.5   Total Protein 6.5 - 8.1 g/dL 6.4  6.7  6.6   Total Bilirubin 0.3 - 1.2 mg/dL 0.8  0.8  0.8   Alkaline Phos 38 - 126 U/L 59  62  60   AST 15 - 41 U/L _2 ALT 0 - 44 U/L _3 RADIOGRAPHIC STUDIES: I have personally reviewed the radiological images as listed and agreed with the findings in the report. No results found.  No orders of the defined types were placed in this  encounter.  All questions were answered. The patient knows to call the clinic with any problems, questions or concerns. No barriers to learning was detected. The total time spent in the appointment was 30 minutes.     Truitt Merle, MD 11/22/2022   Felicity Coyer, CMA, am acting as scribe for Truitt Merle, MD.   I have reviewed the above documentation for accuracy and completeness, and I agree with the above.

## 2022-11-22 NOTE — Assessment & Plan Note (Addendum)
cT2N0M0, stage IB --they opted not to pursue surgery due to comorbidities and overall poor PS -repeat CA 19-9 on 09/20/22 rose to 1,183. -he began single agent gemcitabine on 09/28/22. He experienced more fatigue and loss of appetite after second dose. We discussed decreasing the dose, he agreed and wants to continue chemo for now   -plan to have consolidation radiation after chemo if no progression  -he is scheduled for fiducial placement on 12/13/22 with Dr. Rush Landmark.   -restaging CT scheduled for 12/12

## 2022-11-24 LAB — CANCER ANTIGEN 19-9: CA 19-9: 719 U/mL — ABNORMAL HIGH (ref 0–35)

## 2022-11-27 ENCOUNTER — Ambulatory Visit (HOSPITAL_COMMUNITY)
Admission: RE | Admit: 2022-11-27 | Discharge: 2022-11-27 | Disposition: A | Discharge status: Home / Self Care | Payer: Medicare Other | Source: Ambulatory Visit | Attending: Hematology | Admitting: Hematology

## 2022-11-27 DIAGNOSIS — C251 Malignant neoplasm of body of pancreas: Secondary | ICD-10-CM | POA: Diagnosis not present

## 2022-11-27 MED ORDER — IOHEXOL 300 MG/ML  SOLN
100.0000 mL | Freq: Once | INTRAMUSCULAR | Status: AC | PRN
  Administered 2022-11-27: 100 mL via INTRAVENOUS

## 2022-11-27 MED ORDER — SODIUM CHLORIDE (PF) 0.9 % IJ SOLN
INTRAMUSCULAR | Status: AC
  Filled 2022-11-27: qty 50

## 2022-11-29 NOTE — Progress Notes (Deleted)
[place]   Telephone:([phone]) [phone] Fax:(336) [phone]   Clinic Follow up Note   Patient Care Team: [name], PA as PCP - General (Family Medicine) [place], Heather, [place] (Optometry) [name], MD as Attending Physician (Neurology) Truitt Merle, MD as Consulting Physician (Hematology) Dwan Bolt, MD as Consulting Physician (General Surgery) Mansouraty, Telford Nab., MD as Consulting Physician (Gastroenterology)  Date of Service:  11/29/2022  I connected with Danny [name] on 11/29/2022 at  3:00 PM EST by {Blank single:19197::"video enabled telemedicine visit","telephone visit"} and verified that I am speaking with the correct person using two identifiers.  I discussed the limitations, risks, security and privacy concerns of performing an evaluation and management service by telephone and the availability of in person appointments. I also discussed with the patient that there may be a patient responsible charge related to this service. The patient expressed understanding and agreed to proceed.   Other persons participating in the visit and their role in the encounter:  ***  Patient's location:  *** Provider's location:  ***  CHIEF COMPLAINT: f/u of pancreatic cancer   CURRENT THERAPY:   Gemcitabine, q14d, starting 09/28/22   ASSESSMENT & PLAN: *** Danny [name] is a 71 y.o. male with   ***   ***  No problem-specific Assessment & Plan notes found for this encounter.    SUMMARY OF ONCOLOGIC HISTORY: Oncology History  Pancreatic cancer ([place])  07/01/2022 Imaging   EXAM: CT ANGIOGRAPHY CHEST CT ABDOMEN AND PELVIS WITH CONTRAST  IMPRESSION: 1. No evidence of pulmonary embolism or other acute cardiopulmonary process. 2. Mild-moderate gallbladder distension with diffuse gallbladder wall thickening and pericholecystic fluid/stranding, suggestive of acute cholecystitis. Further evaluation with right upper quadrant ultrasound is recommended. 3.  Appearance of the pancreas suggests mild acute uncomplicated pancreatitis. Correlate with serum lipase. There is a short segment of mild pancreatic ductal dilatation within the pancreatic tail. 4. Slightly increased attenuation within the central mesentery with several small mesenteric lymph nodes, which can be seen in the setting of mesenteric panniculitis. 5. Moderate volume of stool within the colon. 6. Aortic and coronary artery atherosclerosis (ICD10-I70.0).   07/02/2022 Procedure   LAPAROSCOPIC CHOLECYSTECTOMY WITH INTRAOPERATIVE CHOLANGIOGRAM, Dr. Ninfa Linden   08/11/2022 Imaging   CLINICAL DATA:  Pancreatic mass  EXAM: MRI ABDOMEN WITHOUT AND WITH CONTRAST  IMPRESSION: 1. Findings suggest focal pancreatitis involving the mid pancreas as seen on comparison CTs. No organized fluid collections. 2. Discrete hypoenhancing lesion in the mid body the pancreas with upstream duct dilatation. While it is unusual for pancreatic adenocarcinoma to present as acute pancreatitis, this differential diagnosis warrants further evaluation. Recommend correlation with CA 19- 9 serum levels and if elevated consider endoscopic ultrasound for evaluation the mid body of the pancreas. If CA 19 -9 normal, recommend follow-up MRI with without contrast after acute pancreatitis subsided to re-evaluate hypoenhancing lesion in the mid body the pancreas. 3. No MRI evidence complication following cholecystectomy.   08/12/2022 Imaging   CLINICAL DATA:  Abdominal pain, acute, nonlocalized   EXAM: CT ABDOMEN AND PELVIS WITH CONTRAST  IMPRESSION: 1. Increase edema/inflammation around the pancreatic head and body, with contiguous inflammatory/edematous changes extending adjacent to the proximal duodenum, ascending colon and hepatic flexure, and into central mesentery suggesting worsening pancreatitis. 2. Fluid in the right pericolic gutter and pelvis, possibly related to pancreatitis. Bile leak from recent  cholecystectomy is considered less likely possibility. 3. Coronary and Aortic Atherosclerosis (ICD10-170.0).   08/14/2022 Tumor Marker   Patient's tumor was tested for  the following markers: CA 19-9. Results of the tumor marker test revealed significant elevation: 587.   09/13/2022 Procedure   Upper EUS, Dr. Rush Landmark  Impression: EGD impression: - No gross lesions in esophagus proximally. - Z-line irregular, 33 cm from the incisors. Salmon-colored mucosa suspicious for long-segment Barrett's esophagus noted distally - biopsied. - 3 cm hiatal hernia. - Erythematous mucosa in the antrum. No gross lesions in the stomach. Biopsied. - No gross lesions in the duodenal bulb, in the first portion of the duodenum and in the second portion of the duodenum. - Normal major papilla.  EUS Impression: - A masslike area was identified in the genu/body of pancreas. Cytology results are pending. However, the endosonographic appearance is highly suspicious for adenocarcinoma. This was staged T2 N0 Mx by endosonographic criteria. The staging applies if malignancy is confirmed. Fine needle biopsy performed. Based on the imaging findings MRI there has been concern that this area was approximately 4 cm in size so T staging would change if that were to be true but I am more suspicious that the area is just a result of recent pancreatitis changes more than truly being a 4 cm mass. - There was no sign of significant pathology in the common bile duct and in the common hepatic duct. - No malignant-appearing lymph nodes were visualized in the celiac region (level 20), peripancreatic region and porta hepatis region.   09/13/2022 Initial Biopsy   A. PANCREAS, BODY, LESION, FINE NEEDLE ASPIRATION:   FINAL MICROSCOPIC DIAGNOSIS:  - Malignant cells consistent with adenocarcinoma    09/13/2022 Cancer Staging   Staging form: Exocrine Pancreas, AJCC 8th Edition - Clinical stage from 09/13/2022: Stage IB (cT2, cN0, cM0) -  Signed by Truitt Merle, MD on 09/20/2022 Stage prefix: Initial diagnosis Total positive nodes: 0   09/20/2022 Initial Diagnosis   Pancreatic cancer ([name])   09/28/2022 -  Chemotherapy   Patient is on Treatment Plan : PANCREAS Gemcitabine D1,8,15 (1000) q28d x 4 Cycles      Genetic Testing   [facility] CancerNext-Expanded Panel was Negative. Report date is 10/04/2022.  The CancerNext-Expanded gene panel offered by [facility] and includes sequencing, rearrangement, and RNA analysis for the following 77 genes: AIP, ALK, APC, ATM, AXIN2, BAP1, BARD1, BLM, BMPR1A, BRCA1, BRCA2, BRIP1, CDC73, CDH1, CDK4, CDKN1B, CDKN2A, CHEK2, CTNNA1, DICER1, FANCC, FH, FLCN, GALNT12, KIF1B, LZTR1, MAX, MEN1, MET, MLH1, MSH2, MSH3, MSH6, MUTYH, NBN, NF1, NF2, NTHL1, PALB2, PHOX2B, PMS2, POT1, PRKAR1A, PTCH1, PTEN, RAD51C, RAD51D, RB1, RECQL, RET, SDHA, SDHAF2, SDHB, SDHC, SDHD, SMAD4, SMARCA4, SMARCB1, SMARCE1, STK11, SUFU, TMEM127, TP53, TSC1, TSC2, VHL and XRCC2 (sequencing and deletion/duplication); EGFR, EGLN1, HOXB13, KIT, MITF, PDGFRA, POLD1, and POLE (sequencing only); EPCAM and GREM1 (deletion/duplication only).        INTERVAL HISTORY: *** AVYUKT CIMO was contacted for a follow up ofpancreatic cancer   . {Sh/H}e was last seen by me on 11/22/22.    All other systems were reviewed with the patient and are negative.  MEDICAL HISTORY:  Past Medical History:  Diagnosis Date   Allergy    SEASONAL   Cataract    BILATERAL-REMOVED   Essential hypertension, benign    PT.DENIES ON AS PREVENTIVE 08/17/19   GERD (gastroesophageal reflux disease)    Hx of adenomatous polyp of colon 09/04/2019   08/2019 diminutive adenoma No recall given findings and age   Mixed hyperlipidemia    PT.DENIES,STATED ON AS PREVENTIVE 07/30/19   Neuromuscular disorder (H[facility])    PARKINSONS   OA (osteoarthritis)  of knee    right   Obesity, unspecified    Osteoarthritis    KNEE AND SHOULDERS   Pancreatic cancer ([place]) 09/14/2022    diagnosed   Parkinsons    Type II or unspecified type diabetes mellitus without mention of complication, not stated as uncontrolled     SURGICAL HISTORY: Past Surgical History:  Procedure Laterality Date   BIOPSY  09/13/2022   Procedure: BIOPSY;  Surgeon: Rush Landmark Telford Nab., MD;  Location: [facility];  Service: Gastroenterology;;   CATARACT EXTRACTION, BILATERAL     CHOLECYSTECTOMY N/A 07/02/2022   Procedure: LAPAROSCOPIC CHOLECYSTECTOMY WITH ICG DYE  INTRAOPERATIVE CHOLANGIOGRAM;  Surgeon: Coralie Keens, MD;  Location: [facility];  Service: General;  Laterality: N/A;   ESOPHAGOGASTRODUODENOSCOPY N/A 09/13/2022   Procedure: ESOPHAGOGASTRODUODENOSCOPY (EGD);  Surgeon: Irving Copas., MD;  Location: [facility];  Service: Gastroenterology;  Laterality: N/A;   EUS N/A 09/13/2022   Procedure: UPPER ENDOSCOPIC ULTRASOUND (EUS) RADIAL;  Surgeon: Rush Landmark Telford Nab., MD;  Location: [facility];  Service: Gastroenterology;  Laterality: N/A;   FINE NEEDLE ASPIRATION  09/13/2022   Procedure: FINE NEEDLE ASPIRATION (FNA) LINEAR;  Surgeon: Irving Copas., MD;  Location: [facility];  Service: Gastroenterology;;   GANGLION CYST EXCISION  age 43 years   right   KNEE ARTHROSCOPY     right   KNEE ARTHROSCOPY     TOTAL KNEE ARTHROPLASTY Right 03/08/2020   Procedure: RIGHT TOTAL KNEE ARTHROPLASTY;  Surgeon: Garald Balding, MD;  Location: [facility];  Service: Orthopedics;  Laterality: Right;   WRIST FRACTURE SURGERY     WRIST SURGERY  04/2012    I have reviewed the social history and family history with the patient and they are unchanged from previous note.  ALLERGIES:  is allergic to codeine.  MEDICATIONS:  Current Outpatient Medications  Medication Sig Dispense Refill   amLODipine (NORVASC) 10 MG tablet Take 1 tablet (10 mg total) by mouth daily. (Patient not taking: Reported on 09/20/2022) 30 tablet 0   ascorbic acid (VITAMIN C) 500 MG tablet Take 500 mg by mouth in the  morning.     aspirin 81 MG chewable tablet Take 81 mg by mouth daily. Swallow whole.     carbidopa-levodopa (SINEMET CR) 50-200 MG tablet TAKE 1 TABLET BY MOUTH EVERYDAY AT BEDTIME 90 tablet 3   carbidopa-levodopa (SINEMET IR) 25-100 MG tablet TAKE 1 TABLET BY MOUTH 6 (SIX) TIMES DAILY. (Patient taking differently: Take 1 tablet by mouth at bedtime.) 540 tablet 3   Cinnamon 500 MG capsule Take 500 mg by mouth daily.     famotidine (PEPCID) 20 MG tablet Take 20 mg by mouth in the morning and at bedtime.      fludrocortisone (FLORINEF) 0.1 MG tablet TAKE 0.5 TABLETS BY MOUTH DAILY. 45 tablet 0   Glucosamine-Chondroitin (COSAMIN DS PO) Take 1 tablet by mouth in the morning.     lidocaine-prilocaine (EMLA) cream Apply to affected area once 30 g 3   metFORMIN (GLUCOPHAGE) 1000 MG tablet Take 1 tablet (1,000 mg total) by mouth 2 (two) times daily with a meal. 20 tablet 0   Multiple Vitamin (MULTIVITAMIN) tablet Take 1 tablet by mouth daily with breakfast.     ondansetron (ZOFRAN) 8 MG tablet Take 1 tablet (8 mg total) by mouth every 8 (eight) hours as needed for nausea or vomiting. 30 tablet 1   polyethylene glycol (MIRALAX / GLYCOLAX) 17 g packet Take 17 g by mouth every evening.     prochlorperazine (COMPAZINE)  10 MG tablet Take 1 tablet (10 mg total) by mouth every 6 (six) hours as needed for nausea or vomiting. 30 tablet 1   QUEtiapine (SEROQUEL XR) 50 MG TB24 24 hr tablet Take 50 mg by mouth at bedtime.     simvastatin (ZOCOR) 20 MG tablet Take 20 mg by mouth every Monday, Wednesday, and Friday at 6 PM.     No current facility-administered medications for this visit.    PHYSICAL EXAMINATION: ECOG PERFORMANCE STATUS: {CHL ONC ECOG [phone]}  There were no vitals filed for this visit. Wt Readings from Last 3 Encounters:  11/22/22 159 lb (72.1 kg)  11/06/22 163 lb (73.9 kg)  10/11/22 143 lb 8 oz (65.1 kg)    *** No vitals taken today, Exam not performed today  LABORATORY DATA:  I  have reviewed the data as listed    Latest Ref Rng & Units 11/22/2022    1:22 PM 11/06/2022    2:04 PM 10/24/2022    2:15 PM  CBC  WBC 4.0 - 10.5 K/uL 7.9  7.5  7.2   Hemoglobin 13.0 - 17.0 g/dL 12.4  12.1  11.8   Hematocrit 39.0 - 52.0 % 37.0  35.9  35.3   Platelets 150 - 400 K/uL 225  192  165         Latest Ref Rng & Units 11/22/2022    1:22 PM 11/06/2022    2:04 PM 10/24/2022    2:15 PM  CMP  Glucose 70 - 99 mg/dL 136  138  119   BUN 8 - 23 mg/dL _0 Creatinine 0.61 - 1.24 mg/dL 0.69  0.75  0.72   Sodium 135 - 145 mmol/L 138  137  141   Potassium 3.5 - 5.1 mmol/L 4.4  3.9  4.7   Chloride 98 - 111 mmol/L 103  101  104   CO2 22 - 32 mmol/L _1 Calcium 8.9 - 10.3 mg/dL 9.7  9.8  9.5   Total Protein 6.5 - 8.1 g/dL 6.4  6.7  6.6   Total Bilirubin 0.3 - 1.2 mg/dL 0.8  0.8  0.8   Alkaline Phos 38 - 126 U/L 59  62  60   AST 15 - 41 U/L _2 ALT 0 - 44 U/L _3 RADIOGRAPHIC STUDIES: I have personally reviewed the radiological images as listed and agreed with the findings in the report. No results found.    No orders of the defined types were placed in this encounter.  All questions were answered. The patient knows to call the clinic with any problems, questions or concerns. No barriers to learning was detected. The total time spent in the appointment was {CHL ONC TIME VISIT - [id number]}.     Baldemar Friday, CMA 11/29/2022   Felicity Coyer am acting as scribe for Truitt Merle, MD.   {Add scribe attestation statement}

## 2022-11-30 ENCOUNTER — Ambulatory Visit (HOSPITAL_BASED_OUTPATIENT_CLINIC_OR_DEPARTMENT_OTHER): Payer: Medicare Other | Admitting: Hematology

## 2022-11-30 ENCOUNTER — Encounter: Payer: Self-pay | Admitting: Hematology

## 2022-11-30 ENCOUNTER — Inpatient Hospital Stay: Payer: Medicare Other | Admitting: Hematology

## 2022-11-30 VITALS — BP 128/75 | HR 88 | Temp 97.6°F | Resp 15 | Wt 157.6 lb

## 2022-11-30 DIAGNOSIS — C251 Malignant neoplasm of body of pancreas: Secondary | ICD-10-CM | POA: Diagnosis not present

## 2022-11-30 DIAGNOSIS — Z5111 Encounter for antineoplastic chemotherapy: Secondary | ICD-10-CM | POA: Diagnosis not present

## 2022-11-30 NOTE — Assessment & Plan Note (Deleted)
cT2N0M0, stage IB --they opted not to pursue surgery due to comorbidities and overall poor PS -repeat CA 19-9 on 09/20/22 rose to 1,183. -he began single agent gemcitabine on 09/28/22. He experienced more fatigue and loss of appetite after second dose. We discussed decreasing the dose, he agreed and wants to continue chemo for now   -plan to have consolidation radiation after chemo if no progression  -he is scheduled for fiducial placement on 12/13/22 with Dr. Rush Landmark.   -restaging CT scheduled on 12/12 showed worsening primary tumor and possible peritoneal metastasis

## 2022-11-30 NOTE — Progress Notes (Unsigned)
[facility]   [phone] [phone]   Clinic Follow up Note   Patient Care Team: [name], PA as PCP - General (Family Medicine) [place], Heather, [place] (Optometry) [name], MD as Attending Physician (Neurology) Truitt Merle, MD as Consulting Physician (Hematology) Dwan Bolt, MD as Consulting Physician (General Surgery) Mansouraty, Telford Nab., MD as Consulting Physician (Gastroenterology)  Date of Service:  11/30/2022  CHIEF COMPLAINT: f/u of pancreatic cancer    CURRENT THERAPY:   Gemcitabine, q14d, starting 09/28/22    ASSESSMENT:  Danny [name] is a 71 y.o. male with   Pancreatic cancer -cT2N0M0, stage IB --they opted not to pursue surgery due to comorbidities and overall poor PS -repeat CA 19-9 on 09/20/22 rose to 1,183. -he began single agent gemcitabine on 09/28/22. He experienced more fatigue and loss of appetite after second dose. I reduced chemo dose, his overall condition and PS got worse lately.  -I personally reviewed his restaging CT scan from November 27, 2022, which unfortunately showed cancer progression in pancreas, and probable peritoneal metastasis. -Given the disease progression, I do not think radiation is beneficial.  I will reach out to [facility] and let him review his CT scan. -Patient and his wife asked how sure we are about the metastasis, next option of PET scan and or biopsy.  Reviewed his imaging in our GI tumor conference. -Due to his worsening performance status and Parkinson disease, I do not think he is a candidate for second line chemotherapy.  I discussed Perative care and hospice.  He and his wife are very familiar with hospice, they will think about it.   PLAN: - Discuss Scan results which showed cancer progression -will discussed with Dr. Rush Landmark and Dr. Lisbeth Renshaw, will likely cancel his radiation  -I will call him back if radiation is not recommended, and see if he agrees with hospice.   SUMMARY  OF ONCOLOGIC HISTORY: Oncology History  Pancreatic cancer ([place])  07/01/2022 Imaging   EXAM: CT ANGIOGRAPHY CHEST CT ABDOMEN AND PELVIS WITH CONTRAST  IMPRESSION: 1. No evidence of pulmonary embolism or other acute cardiopulmonary process. 2. Mild-moderate gallbladder distension with diffuse gallbladder wall thickening and pericholecystic fluid/stranding, suggestive of acute cholecystitis. Further evaluation with right upper quadrant ultrasound is recommended. 3. Appearance of the pancreas suggests mild acute uncomplicated pancreatitis. Correlate with serum lipase. There is a short segment of mild pancreatic ductal dilatation within the pancreatic tail. 4. Slightly increased attenuation within the central mesentery with several small mesenteric lymph nodes, which can be seen in the setting of mesenteric panniculitis. 5. Moderate volume of stool within the colon. 6. Aortic and coronary artery atherosclerosis (ICD10-I70.0).   07/02/2022 Procedure   LAPAROSCOPIC CHOLECYSTECTOMY WITH INTRAOPERATIVE CHOLANGIOGRAM, Dr. Ninfa Linden   08/11/2022 Imaging   CLINICAL DATA:  Pancreatic mass  EXAM: MRI ABDOMEN WITHOUT AND WITH CONTRAST  IMPRESSION: 1. Findings suggest focal pancreatitis involving the mid pancreas as seen on comparison CTs. No organized fluid collections. 2. Discrete hypoenhancing lesion in the mid body the pancreas with upstream duct dilatation. While it is unusual for pancreatic adenocarcinoma to present as acute pancreatitis, this differential diagnosis warrants further evaluation. Recommend correlation with CA 19- 9 serum levels and if elevated consider endoscopic ultrasound for evaluation the mid body of the pancreas. If CA 19 -9 normal, recommend follow-up MRI with without contrast after acute pancreatitis subsided to re-evaluate hypoenhancing lesion in the mid body the pancreas. 3. No MRI evidence complication following cholecystectomy.   08/12/2022 Imaging  CLINICAL DATA:   Abdominal pain, acute, nonlocalized   EXAM: CT ABDOMEN AND PELVIS WITH CONTRAST  IMPRESSION: 1. Increase edema/inflammation around the pancreatic head and body, with contiguous inflammatory/edematous changes extending adjacent to the proximal duodenum, ascending colon and hepatic flexure, and into central mesentery suggesting worsening pancreatitis. 2. Fluid in the right pericolic gutter and pelvis, possibly related to pancreatitis. Bile leak from recent cholecystectomy is considered less likely possibility. 3. Coronary and Aortic Atherosclerosis (ICD10-170.0).   08/14/2022 Tumor Marker   Patient's tumor was tested for the following markers: CA 19-9. Results of the tumor marker test revealed significant elevation: 587.   09/13/2022 Procedure   Upper EUS, Dr. Rush Landmark  Impression: EGD impression: - No gross lesions in esophagus proximally. - Z-line irregular, 33 cm from the incisors. Salmon-colored mucosa suspicious for long-segment Barrett's esophagus noted distally - biopsied. - 3 cm hiatal hernia. - Erythematous mucosa in the antrum. No gross lesions in the stomach. Biopsied. - No gross lesions in the duodenal bulb, in the first portion of the duodenum and in the second portion of the duodenum. - Normal major papilla.  EUS Impression: - A masslike area was identified in the genu/body of pancreas. Cytology results are pending. However, the endosonographic appearance is highly suspicious for adenocarcinoma. This was staged T2 N0 Mx by endosonographic criteria. The staging applies if malignancy is confirmed. Fine needle biopsy performed. Based on the imaging findings MRI there has been concern that this area was approximately 4 cm in size so T staging would change if that were to be true but I am more suspicious that the area is just a result of recent pancreatitis changes more than truly being a 4 cm mass. - There was no sign of significant pathology in the common bile duct and in the  common hepatic duct. - No malignant-appearing lymph nodes were visualized in the celiac region (level 20), peripancreatic region and porta hepatis region.   09/13/2022 Initial Biopsy   A. PANCREAS, BODY, LESION, FINE NEEDLE ASPIRATION:   FINAL MICROSCOPIC DIAGNOSIS:  - Malignant cells consistent with adenocarcinoma    09/13/2022 Cancer Staging   Staging form: Exocrine Pancreas, AJCC 8th Edition - Clinical stage from 09/13/2022: Stage IB (cT2, cN0, cM0) - Signed by Truitt Merle, MD on 09/20/2022 Stage prefix: Initial diagnosis Total positive nodes: 0   09/20/2022 Initial Diagnosis   Pancreatic cancer ([place])   09/28/2022 -  Chemotherapy   Patient is on Treatment Plan : PANCREAS Gemcitabine D1,8,15 (1000) q28d x 4 Cycles      Genetic Testing   [facility] CancerNext-Expanded Panel was Negative. Report date is 10/04/2022.  The CancerNext-Expanded gene panel offered by [facility] and includes sequencing, rearrangement, and RNA analysis for the following 77 genes: AIP, ALK, APC, ATM, AXIN2, BAP1, BARD1, BLM, BMPR1A, BRCA1, BRCA2, BRIP1, CDC73, CDH1, CDK4, CDKN1B, CDKN2A, CHEK2, CTNNA1, DICER1, FANCC, FH, FLCN, GALNT12, KIF1B, LZTR1, MAX, MEN1, MET, MLH1, MSH2, MSH3, MSH6, MUTYH, NBN, NF1, NF2, NTHL1, PALB2, PHOX2B, PMS2, POT1, PRKAR1A, PTCH1, PTEN, RAD51C, RAD51D, RB1, RECQL, RET, SDHA, SDHAF2, SDHB, SDHC, SDHD, SMAD4, SMARCA4, SMARCB1, SMARCE1, STK11, SUFU, TMEM127, TP53, TSC1, TSC2, VHL and XRCC2 (sequencing and deletion/duplication); EGFR, EGLN1, HOXB13, KIT, MITF, PDGFRA, POLD1, and POLE (sequencing only); EPCAM and GREM1 (deletion/duplication only).     11/27/2022 Imaging    IMPRESSION: 1. Interval enlargement of the ill-defined infiltrating pancreatic body mass with progressive tumor surrounding the celiac axis and its branch vessels and also surrounding the SMA. 2. New soft tissue nodularity extending along the  right anterior pararenal space and also anterior to the third portion the  duodenum, worrisome for peritoneal spread of tumor. 3. No findings for hepatic metastatic disease. 4. Chronic splenic vein occlusion with perisplenic and perigastric collaterals. 5. Aortic atherosclerosis      INTERVAL HISTORY:  DEKLAN MINAR is here for a follow up of pancreatic cancer   He was last seen by me on 11/22/22 He presents to the clinic accompanied by wife.Pt states last treatment went ok. Parkinson is getting worse.   All other systems were reviewed with the patient and are negative.  MEDICAL HISTORY:  Past Medical History:  Diagnosis Date   Allergy    SEASONAL   Cataract    BILATERAL-REMOVED   Essential hypertension, benign    PT.DENIES ON AS PREVENTIVE 08/17/19   GERD (gastroesophageal reflux disease)    Hx of adenomatous polyp of colon 09/04/2019   08/2019 diminutive adenoma No recall given findings and age   Mixed hyperlipidemia    PT.DENIES,STATED ON AS PREVENTIVE 07/30/19   Neuromuscular disorder (HCC)    PARKINSONS   OA (osteoarthritis) of knee    right   Obesity, unspecified    Osteoarthritis    KNEE AND SHOULDERS   Pancreatic cancer ([place]) 09/14/2022   diagnosed   Parkinsons    Type II or unspecified type diabetes mellitus without mention of complication, not stated as uncontrolled     SURGICAL HISTORY: Past Surgical History:  Procedure Laterality Date   BIOPSY  09/13/2022   Procedure: BIOPSY;  Surgeon: Irving Copas., MD;  Location: [facility];  Service: Gastroenterology;;   CATARACT EXTRACTION, BILATERAL     CHOLECYSTECTOMY N/A 07/02/2022   Procedure: LAPAROSCOPIC CHOLECYSTECTOMY WITH ICG DYE  INTRAOPERATIVE CHOLANGIOGRAM;  Surgeon: Coralie Keens, MD;  Location: [facility];  Service: General;  Laterality: N/A;   ESOPHAGOGASTRODUODENOSCOPY N/A 09/13/2022   Procedure: ESOPHAGOGASTRODUODENOSCOPY (EGD);  Surgeon: Irving Copas., MD;  Location: [facility];  Service: Gastroenterology;  Laterality: N/A;   EUS N/A 09/13/2022    Procedure: UPPER ENDOSCOPIC ULTRASOUND (EUS) RADIAL;  Surgeon: Rush Landmark Telford Nab., MD;  Location: [facility];  Service: Gastroenterology;  Laterality: N/A;   FINE NEEDLE ASPIRATION  09/13/2022   Procedure: FINE NEEDLE ASPIRATION (FNA) LINEAR;  Surgeon: Irving Copas., MD;  Location: [facility];  Service: Gastroenterology;;   GANGLION CYST EXCISION  age 66 years   right   KNEE ARTHROSCOPY     right   KNEE ARTHROSCOPY     TOTAL KNEE ARTHROPLASTY Right 03/08/2020   Procedure: RIGHT TOTAL KNEE ARTHROPLASTY;  Surgeon: [place] Balding, MD;  Location: [facility];  Service: Orthopedics;  Laterality: Right;   WRIST FRACTURE SURGERY     WRIST SURGERY  04/2012    I have reviewed the social history and family history with the patient and they are unchanged from previous note.  ALLERGIES:  is allergic to codeine.  MEDICATIONS:  Current Outpatient Medications  Medication Sig Dispense Refill   amLODipine (NORVASC) 10 MG tablet Take 1 tablet (10 mg total) by mouth daily. (Patient not taking: Reported on 09/20/2022) 30 tablet 0   ascorbic acid (VITAMIN C) 500 MG tablet Take 500 mg by mouth in the morning.     aspirin 81 MG chewable tablet Take 81 mg by mouth daily. Swallow whole.     carbidopa-levodopa (SINEMET CR) 50-200 MG tablet TAKE 1 TABLET BY MOUTH EVERYDAY AT BEDTIME 90 tablet 3   carbidopa-levodopa (SINEMET IR) 25-100 MG tablet TAKE 1 TABLET BY MOUTH 6 (  SIX) TIMES DAILY. (Patient taking differently: Take 1 tablet by mouth at bedtime.) 540 tablet 3   Cinnamon 500 MG capsule Take 500 mg by mouth daily.     famotidine (PEPCID) 20 MG tablet Take 20 mg by mouth in the morning and at bedtime.      fludrocortisone (FLORINEF) 0.1 MG tablet TAKE 0.5 TABLETS BY MOUTH DAILY. 45 tablet 0   Glucosamine-Chondroitin (COSAMIN DS PO) Take 1 tablet by mouth in the morning.     lidocaine-prilocaine (EMLA) cream Apply to affected area once 30 g 3   metFORMIN (GLUCOPHAGE) 1000 MG tablet Take 1 tablet  (1,000 mg total) by mouth 2 (two) times daily with a meal. 20 tablet 0   Multiple Vitamin (MULTIVITAMIN) tablet Take 1 tablet by mouth daily with breakfast.     ondansetron (ZOFRAN) 8 MG tablet Take 1 tablet (8 mg total) by mouth every 8 (eight) hours as needed for nausea or vomiting. 30 tablet 1   polyethylene glycol (MIRALAX / GLYCOLAX) 17 g packet Take 17 g by mouth every evening.     prochlorperazine (COMPAZINE) 10 MG tablet Take 1 tablet (10 mg total) by mouth every 6 (six) hours as needed for nausea or vomiting. 30 tablet 1   QUEtiapine (SEROQUEL XR) 50 MG TB24 24 hr tablet Take 50 mg by mouth at bedtime.     simvastatin (ZOCOR) 20 MG tablet Take 20 mg by mouth every Monday, Wednesday, and Friday at 6 PM.     No current facility-administered medications for this visit.    PHYSICAL EXAMINATION: ECOG PERFORMANCE STATUS: 3 - Symptomatic, >50% confined to bed  Vitals:   11/30/22 1458  BP: 128/75  Pulse: 88  Resp: 15  Temp: 97.6 F (36.4 C)  SpO2: 100%   Wt Readings from Last 3 Encounters:  11/30/22 157 lb 9.6 oz (71.5 kg)  11/22/22 159 lb (72.1 kg)  11/06/22 163 lb (73.9 kg)     GENERAL:alert, no distress and comfortable SKIN: skin color normal, no rashes or significant lesions EYES: normal, Conjunctiva are pink and non-injected, sclera clear  NEURO: alert & oriented x 3 with fluent speech LABORATORY DATA:  I have reviewed the data as listed    Latest Ref Rng & Units 11/22/2022    1:22 PM 11/06/2022    2:04 PM 10/24/2022    2:15 PM  CBC  WBC 4.0 - 10.5 K/uL 7.9  7.5  7.2   Hemoglobin 13.0 - 17.0 g/dL 12.4  12.1  11.8   Hematocrit 39.0 - 52.0 % 37.0  35.9  35.3   Platelets 150 - 400 K/uL 225  192  165         Latest Ref Rng & Units 11/22/2022    1:22 PM 11/06/2022    2:04 PM 10/24/2022    2:15 PM  CMP  Glucose 70 - 99 mg/dL 136  138  119   BUN 8 - 23 mg/dL _0 Creatinine 0.61 - 1.24 mg/dL 0.69  0.75  0.72   Sodium 135 - 145 mmol/L 138  137  141    Potassium 3.5 - 5.1 mmol/L 4.4  3.9  4.7   Chloride 98 - 111 mmol/L 103  101  104   CO2 22 - 32 mmol/L _1 Calcium 8.9 - 10.3 mg/dL 9.7  9.8  9.5   Total Protein 6.5 - 8.1 g/dL 6.4  6.7  6.6   Total Bilirubin 0.3 -  1.2 mg/dL 0.8  0.8  0.8   Alkaline Phos 38 - 126 U/L 59  62  60   AST 15 - 41 U/L _0 ALT 0 - 44 U/L _1 RADIOGRAPHIC STUDIES: I have personally reviewed the radiological images as listed and agreed with the findings in the report. No results found.    No orders of the defined types were placed in this encounter.  All questions were answered. The patient knows to call the clinic with any problems, questions or concerns. No barriers to learning was detected. The total time spent in the appointment was 30 minutes.     Truitt Merle, MD 11/30/2022   Felicity Coyer, CMA, am acting as scribe for Truitt Merle, MD.   I have reviewed the above documentation for accuracy and completeness, and I agree with the above.

## 2022-12-01 ENCOUNTER — Encounter: Payer: Self-pay | Admitting: Hematology

## 2022-12-04 ENCOUNTER — Encounter (HOSPITAL_COMMUNITY): Payer: Self-pay | Admitting: Gastroenterology

## 2022-12-06 NOTE — Telephone Encounter (Signed)
The pt has been cancelled.

## 2022-12-06 NOTE — Telephone Encounter (Signed)
Noted  

## 2022-12-06 NOTE — Telephone Encounter (Signed)
-----   Message from Truitt Merle, MD sent at 12/06/2022  3:04 PM EST ----- Danny [name],  I have talked to Dr. Lisbeth Renshaw and we decided not to proceed SBRT due to his cancer progression. Please cancel his fiducial placement which is scheduled for late next week.   Krista Blue

## 2022-12-13 ENCOUNTER — Ambulatory Visit (HOSPITAL_COMMUNITY): Admission: RE | Admit: 2022-12-13 | Payer: Medicare Other | Source: Ambulatory Visit | Admitting: Gastroenterology

## 2022-12-13 SURGERY — UPPER ENDOSCOPIC ULTRASOUND (EUS) RADIAL
Anesthesia: Monitor Anesthesia Care

## 2022-12-18 ENCOUNTER — Other Ambulatory Visit: Payer: Self-pay

## 2022-12-18 DIAGNOSIS — C251 Malignant neoplasm of body of pancreas: Secondary | ICD-10-CM

## 2022-12-18 DIAGNOSIS — G20A2 Parkinson's disease without dyskinesia, with fluctuations: Secondary | ICD-10-CM

## 2022-12-18 NOTE — Progress Notes (Signed)
VM was left from [facility] RN requesting a verbal order for a referral to hospice services for this pt. Per Dr.Feng pt is hospice appropriate and order to be placed, notified [facility] liaison of order.

## 2023-01-28 ENCOUNTER — Encounter: Payer: Self-pay | Admitting: Neurology

## 2023-01-28 ENCOUNTER — Ambulatory Visit (INDEPENDENT_AMBULATORY_CARE_PROVIDER_SITE_OTHER): Admitting: Neurology

## 2023-01-28 VITALS — BP 112/69 | HR 92 | Ht 68.0 in | Wt 152.0 lb

## 2023-01-28 DIAGNOSIS — G4719 Other hypersomnia: Secondary | ICD-10-CM | POA: Diagnosis not present

## 2023-01-28 DIAGNOSIS — R442 Other hallucinations: Secondary | ICD-10-CM

## 2023-01-28 DIAGNOSIS — R441 Visual hallucinations: Secondary | ICD-10-CM | POA: Diagnosis not present

## 2023-01-28 DIAGNOSIS — G20A2 Parkinson's disease without dyskinesia, with fluctuations: Secondary | ICD-10-CM | POA: Diagnosis not present

## 2023-01-28 NOTE — Patient Instructions (Signed)
Verbal instructions given

## 2023-01-28 NOTE — Progress Notes (Signed)
Subjective:    Patient ID: Danny [name] is a 72 y.o. male.  HPI    Interim history:   Danny [name] is a 72 year old right-handed gentleman with an underlying medical history of type 2 diabetes, osteoarthritis, status post right knee replacement in March 2021, hypertension, obesity, hyperlipidemia, cataracts, cholelithiasis with status post right cholecystectomy on 07/02/2022, recurrent pancreatitis and pancreatic adenocarcinoma (followed by Onc and Palliative care), who presents for follow-up consultation of his advanced Parkinson's disease, complicated by decline in mobility, deconditioning, knee pain on the R, history of IBD, memory loss, hallucinations, and recurrent falls.  The patient is accompanied by his wife again today.  I last saw him on 09/20/2022 to discuss and evaluate him for a need for an ultralight wheelchair.  He already had a regular wheelchair but it was very difficult for his wife to lift it into the car and out of the car, he needed more help with mobility.  He had lost weight, deemed secondary to his cancer diagnosis.  He had been assessed for his ability to propel an ultralight manual wheelchair and did well.  He had inpatient rehab until early October 2023.  Today, 01/28/2023: He reports having difficulty feeding himself.  He reports sometimes feeling something that is not there, he endorses that he sees things that are not real but they are real for him.  His wife reports that he needs assistance with all his ADLs, he no longer walks, he is essentially wheelchair-bound, he no longer sees oncology, she reports that he is now with palliative care.  He was not deemed a surgical candidate for his advanced pancreatic cancer.  His appetite is less, he eats about 2 meals per day.  She reports that palliative care wondered if his levodopa should be increased.  He has ongoing hallucinations and low blood pressure values, takes Florinef once daily.  He takes Sinemet CR at bedtime and  Sinemet IR 6 times a day but she reports that she has to wake him up for his 8:00 and 10:00 does typically and he goes back to sleep.  He sleeps a lot during the day.  He does not exercise during the day, he had home health therapy but they stopped about 3 to 4 weeks ago.  She reports that he was not making any progress any longer.  The patient's allergies, current medications, family history, past medical history, past social history, past surgical history and problem list were reviewed and updated as appropriate.      Previously (copied from previous notes for reference):    I saw him on 01/09/2022, at which time he reported further decline in his mobility and change in his posture.  He had fallen.  He had had low blood pressure values especially in the mornings.  He was having fairly consistent hallucinations.  He needed help with showering and dressing as well as putting his compression socks on.  I suggested he taper off pramipexole.  He was advised to continue with generic Sinemet 1 pill 6 times a day and Sinemet CR at bedtime.   He saw Ward Givens, NP on 04/17/2022, at which time he was doing home health PT.  He was using his manual wheelchair primarily.  A motorized wheelchair was discussed at the time.  He did not have the strength to propel himself in a manual wheelchair.         I saw him on 11/01/2021, at which time I wrote for a manual wheelchair.  I  continued his Florinef and his other medications.  He was having daily consistent hallucinations per wife.  We had tried Nuplazid before.  A trial of Nourianz for off time resulted in worsening hallucinations.  His blood pressure was typically lower in the first part of the day and he was on half a pill of Florinef.    Patient's wife called in November 2022 reporting that patient had a decline in his health and that his speech had changed.  He was seen by his primary care nurse PA.  He presented to the emergency room on 11/08/2021 for  slurring of speech lasting several days.  I reviewed the emergency room records.  He had a CT angiogram of the head and neck with and without contrast on 11/08/2021 and I reviewed the results:   IMPRESSION: 1. No acute intracranial pathology or large vessel occlusion. 2. Mild calcified atherosclerotic plaque in the bilateral carotid bulbs without hemodynamically significant stenosis or occlusion. Otherwise, patent vasculature in the head and neck.   He had a brain MRI without contrast on 11/08/2021 and I reviewed the results: IMPRESSION: No evidence of acute intracranial abnormality.  No acute infarct.   Laboratory studies showed a benign urinalysis, CBC with differential benign, CMP showed BUN of 15, creatinine 1.15, normal sodium and potassium levels, normal liver enzymes, total bilirubin slightly elevated at 1.5.   Patient's wife called in January 2023 reporting that he has had a steady decline over the past 1 or 2 months.  She reported that he had COVID around Christmas 2022.  She reported a recent fall.     I saw him on 01/11/2021, at which time we mutually agreed to continue his medication regimen.  He was on low-dose Seroquel per primary care.  He was on Florinef for low blood pressure values.   He saw Vaughan Browner, NP in the interim on 05/25/2021, at which time his wife reported frequent falls.  He was kept on the same medication regimen.       I saw him on 10/11/20, at which time he reported lack of stamina and feeling overall weaker, having more freezing and more off time.  He did not increase his Sinemet to 1 pill 6 times a day as discussed during the visit prior.  He had lost quite a bit of weight.  He had some low blood pressure values.  He was advised to increase his Sinemet only mutually agreed to try him on Nourianz.   His wife called about a month later reporting that he had more hallucinations on the [place].  Unfortunately, they were not able to afford the Nuplazid.     I  saw him on 07/06/2020, at which time he had more difficulty relating his own history and he had more decline in his motor function.  He had fallen multiple times.  He had knee replacement surgery.  They could not afford the prescription for Nuplazid long-term.  He was advised to increase his Sinemet to 1 pill 6 times a day and continue with the Sinemet CR at bedtime.  He had stable hallucinations.  He continues to take low-dose Mirapex 0.5 mg 3 times daily.    I saw him on 02/11/2020, at which time we talked about his medication regimen, recent fall, and he needed clearance for his right knee replacement.   He had an appointment in the interim with Debbora Presto, nurse practitioner on 05/23/2020, at which time Sinemet CR was added at bedtime and he was advised  to start Nuplazid.   However, his wife reported back that Nuplazid was not started secondary to cost.         I saw him on 10/05/2019, at which time he reported feeling tired during the day.  His wife endorsed that he was quite sleepy during the day, particularly in the early evenings.  He has had some falls.  He had bruised his sternum and ribs.  He did fall in his kitchen and hit his head against a curtain holder and had a small area of skin injury in the right forehead.  He was advised to continue with Sinemet 1 pill 5 times a day but advised to further reduce his Mirapex to 0.5 mg 3 times daily.  I asked him to have a head CT done.  He had a head CT without contrast on 10/08/2020 and I reviewed the results: IMPRESSION:    Normal CT head (without).  We called him with his test results.     I saw him on 06/04/2019, at which time he reported difficulty with his mobility.  He had fallen, thankfully without major injuries.  His wife had noticed more forgetfulness and some confusion at times.  He was more sleepy during the day.  He was taking Sinemet 4 times a day and had more difficulty in the mornings.  He was on Mirapex 3 times a day.  He had  intermittent constipation.  He reported fleeting hallucinations, nothing sustained or anxiety provoking.  He was advised to increase his Sinemet to 1 pill 5 times a day at 3 hourly intervals starting at 8 AM.  He was advised to continue with Mirapex 3 times daily.  He was advised to be very proactive about constipation issues.    I saw him on 12/03/2018, at which time he was advised to continue with Sinemet 1 pill 4 times a day.  He did have her fall incident after he missed the chair when sitting down.  His memory was stable for the most part.  He was advised to hydrate well and be proactive about constipation issues, we talked about fall prevention.     I saw him on 06/05/2018, at which time he felt fairly stable but his wife had noticed more shuffling and also some mood irritability. He had no falls thankfully and some forgetfulness was noted. He was more sleepy during the day. Suggested he continue with generic Mirapex 0.75 mg 3 times a day but increase his Sinemet from 3 times a day to 1 pill 4 times a day.   His wife called in the interim in October 2019 requesting a letter to support their cancellation of their timeshare as he was not able to drive and they were not able to utilize their timeshare.    I saw him on 12/05/2017, at which time he felt fairly stable on Sinemet, we had reduced his Mirapex to 0.75 mg 3 times a day. He is trying to stay active. He had no recent falls. I suggested we continue with his Sinemet and Mirapex at the current doses.   I saw him on 09/09/2017 at which time he had been more sleepy during the day. His wife was worried that he was more preoccupied with sex. He had ongoing issues with intermittent double vision. He had intermittent and mild dream enactments. Suggested we gradually start him on low-dose Sinemet starting with half pill and gradually increasing this to 1 pill 3 times a day. His wife called in the  interim in October reporting that he had done fairly well  with Sinemet, at which time we reduced his Mirapex from 1 mg 3 times a day to 0.75 mg 3 times a day.      I saw him on 03/04/2017, at which time he felt stable. He retired in December 2017. The daughter was living with them. Mother-in-law had moved in as well. His wife retired also. He was going to the gym on a regular basis. Memory-wise, he had mild forgetfulness, no mood issues, no sleep issues, no side effects from the Mirapex. He had no recent falls thankfully. He had some mild dream enactments but no major issues. Looking back, his symptoms of peak date back to 2014, he had a sleep study in early 2014 with PLMS noted but no OSA and he denies restless leg symptoms. We mutually agreed to taper him off of Azilect as it was too expensive. I suggested in lieu of the Azilect, we increase his pramipexole to 1 mg 3 times a day.     I saw him on 09/03/2016, at which time he reported doing well, no telltale differences after increasing the Mirapex. He gave up driving. He decided to retire by the end of December 2017. His mother-in-law was supposed to move in with them. His memory was stable, sleep was stable, no real mood issues, no recent falls. I suggested we keep his medications the same including Azilect 1 mg once daily, Mirapex 0.75 mg 3 times a day.   I saw him on 02/27/2016, at which time he reported more difficulty with his walking, he had become slower. Wife had noted that it would take him longer to do things. He was dragging his feet at times. He was not swinging his arms as well. He was working full-time as a Land, avoiding stairs and heights. His A1c in December 2016 was a little up at 7.6. Memory was stable, with some mild forgetfulness. Mood was stable, RBD was infrequent. He reported no recent falls. I suggested he continue with Azilect and we increased his Mirapex to 0.75 mg 3 times a day.    I saw him on 08/29/2015 at which time he reported doing fairly well. He was able to  tolerate Azilect. His Mirapex was 0.5 mg 3 times a day. He was working at the computer most of the time. He was not always drinking enough water. He had a good appetite. He felt his memory was stable. RBD was less frequent but still occurring. His wife was able to sleep in the same bed with him. He had no recent falls. He had some recent blurry vision was going to see his ophthalmologist. We mutually agreed to continue his current medication regimen.   I saw him on 04/25/2015, at which time he reported doing fairly well overall, but had noted smaller changes, including more slowness, difficulty with climbing ladders. He was advised by his boss to not climb ladders or walk over obstacles at work. Thankfully, his work environment is understanding. He had another business trip coming up and a colleague was to drive and do the climbing on the sites. He reported mild forgetfulness and some problems with depth perception. His diplopia had improved since he was given prism glasses by his ophthalmologist. I suggested, we try him on Azilect and keep his Mirapex the same.    I saw him on 02/04/2015, at which time he reported ongoing issues with diplopia, particularly at night while driving. He was still  working full-time. He was still on low-dose Mirapex, 0.25 mg 3 times a day. He had no side effects. He had had a sleep study which per his report did not show any OSA. I asked him to make an appointment with his eye doctor. I suggested an increase in Mirapex to 0.5 mg 3 times a day.   I first met him on 12/28/2014, at which time he reported that he was having more fatigue. However, he also had reduce his caffeine intake. He had no recent falls and mood stable. He was working full-time. He reported that he had intermittent double vision in the last 3 months. His PCP requested a sooner than scheduled appointment for diplopia. His exam was stable at the time and I suggested no new medication changes.   He has had some  short-term memory issues. He works full-time. He is an Chief Financial Officer. He likes square dance but his wife has noted that he has had more problems with his nighttime driving and his squared hands. In the recent 3 months he has had some intermittent double vision at night. He has drooling at night. He rarely drinks alcohol and usually drinks 4-5 cups of coffee per day, occasional sodas. He quit smoking in 1978. Blood pressure is low for him today. It was recently noted to be trending lower and his blood pressure medication was reduced. He takes his cholesterol medication every other day.   He had a brain MRI without contrast on 11/04/2013: Normal MRI scan of the brain. Incidental findings of a chronic paranasal sinusitis with deviated nasal septum to the left.  He previously followed with Dr. Jim Like and was last seen by him on 08/30/2014, at which time his Mirapex was kept at 0.25 mg 3 times a day. He reported no side effects, in particular no impulse control disorder. I reviewed Dr. Hazle Quant notes. He first met Dr. Janann Colonel on 10/26/13, at which time the patient reported a right hand tremor noticed over the previous 4-6 months. He had changes in his handwriting, slowness, some stiffness and changes in his walking.  His wife reported a longer standing history of REM behavior disorder. He has been in outpatient physical therapy.   His Past Medical History Is Significant For: Past Medical History:  Diagnosis Date   Allergy    SEASONAL   Cataract    BILATERAL-REMOVED   Essential hypertension, benign    PT.DENIES ON AS PREVENTIVE 08/17/19   GERD (gastroesophageal reflux disease)    Hx of adenomatous polyp of colon 09/04/2019   08/2019 diminutive adenoma No recall given findings and age   Mixed hyperlipidemia    PT.DENIES,STATED ON AS PREVENTIVE 07/30/19   Neuromuscular disorder (HCC)    PARKINSONS   OA (osteoarthritis) of knee    right   Obesity, unspecified    Osteoarthritis    KNEE AND SHOULDERS    Pancreatic cancer ([place]) 09/14/2022   diagnosed   Parkinsons    Type II or unspecified type diabetes mellitus without mention of complication, not stated as uncontrolled     His Past Surgical History Is Significant For: Past Surgical History:  Procedure Laterality Date   BIOPSY  09/13/2022   Procedure: BIOPSY;  Surgeon: Irving Copas., MD;  Location: [facility];  Service: Gastroenterology;;   CATARACT EXTRACTION, BILATERAL     CHOLECYSTECTOMY N/A 07/02/2022   Procedure: LAPAROSCOPIC CHOLECYSTECTOMY WITH ICG DYE  INTRAOPERATIVE CHOLANGIOGRAM;  Surgeon: Coralie Keens, MD;  Location: [facility];  Service: General;  Laterality: N/A;  ESOPHAGOGASTRODUODENOSCOPY N/A 09/13/2022   Procedure: ESOPHAGOGASTRODUODENOSCOPY (EGD);  Surgeon: Irving Copas., MD;  Location: [facility];  Service: Gastroenterology;  Laterality: N/A;   EUS N/A 09/13/2022   Procedure: UPPER ENDOSCOPIC ULTRASOUND (EUS) RADIAL;  Surgeon: Rush Landmark Telford Nab., MD;  Location: [facility];  Service: Gastroenterology;  Laterality: N/A;   FINE NEEDLE ASPIRATION  09/13/2022   Procedure: FINE NEEDLE ASPIRATION (FNA) LINEAR;  Surgeon: Irving Copas., MD;  Location: [facility];  Service: Gastroenterology;;   GANGLION CYST EXCISION  age 5 years   right   KNEE ARTHROSCOPY     right   KNEE ARTHROSCOPY     TOTAL KNEE ARTHROPLASTY Right 03/08/2020   Procedure: RIGHT TOTAL KNEE ARTHROPLASTY;  Surgeon: Garald Balding, MD;  Location: [facility];  Service: Orthopedics;  Laterality: Right;   WRIST FRACTURE SURGERY     WRIST SURGERY  04/2012    His Family History Is Significant For: Family History  Problem Relation Age of Onset   Diabetes Mother    Lung cancer Mother    Cancer Father        unknown type   Heart disease Father    Diabetes Sister    Cancer Maternal Uncle        gastric cancer   Diabetes Daughter    Colon polyps Son    Colon cancer Neg Hx    Esophageal cancer Neg Hx    Rectal cancer  Neg Hx    Stomach cancer Neg Hx    Parkinson's disease Neg Hx     His Social History Is Significant For: Social History   Socioeconomic History   Marital status: Married    Spouse name: [name]   Number of children: 3   Years of education: 16   Highest education level: Bachelor's degree (e.g., BA, AB, BS)  Occupational History   Occupation: Chief Financial Officer  Tobacco Use   Smoking status: Former    Packs/day: 1.50    Years: 15.00    Total pack years: 22.50    Types: Cigarettes    Quit date: 12/17/1982    Years since quitting: 40.1   Smokeless tobacco: Never   Tobacco comments:    over 40 yrs  Vaping Use   Vaping Use: Never used  Substance and Sexual Activity   Alcohol use: Not Currently   Drug use: No   Sexual activity: Yes    Partners: Female  Other Topics Concern   Not on file  Social History Narrative   Research officer, trade union, motorcycle rider.  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  He has 3 adult children.  His wife had 2 children, one of whom is deceased.   Retired.    Patient has a Bachelor's degree.   Patient is right-handed.   Patient drinks 1 cups of coffee daily.   Social Determinants of Health   Financial Resource Strain: Low Risk  (01/17/2018)   Overall Financial Resource Strain (CARDIA)    Difficulty of Paying Living Expenses: Not hard at all  Food Insecurity: No Food Insecurity (01/17/2018)   Hunger Vital Sign    Worried About Running Out of Food in the Last Year: Never true    Ran Out of Food in the Last Year: Never true  Transportation Needs: No Transportation Needs (01/17/2018)   PRAPARE - Hydrologist (Medical): No    Lack of Transportation (Non-Medical): No  Physical Activity: Inactive (01/17/2018)   Exercise Vital Sign    Days of Exercise per  Week: 0 days    Minutes of Exercise per Session: 0 min  Stress: No Stress Concern Present (01/17/2018)   [place]    Feeling of Stress :  Not at all  Social Connections: [place] (01/17/2018)   Social Connection and Isolation Panel [NHANES]    Frequency of Communication with Friends and Family: More than three times a week    Frequency of Social Gatherings with Friends and Family: More than three times a week    Attends Religious Services: More than 4 times per year    Active Member of [employer] or Organizations: Yes    Attends Music therapist: More than 4 times per year    Marital Status: Married    His Allergies Are:  Allergies  Allergen Reactions   Codeine Nausea And Vomiting  :   His Current Medications Are:  Outpatient Encounter Medications as of 01/28/2023  Medication Sig   aspirin 81 MG chewable tablet Take 81 mg by mouth daily. Swallow whole.   carbidopa-levodopa (SINEMET CR) 50-200 MG tablet TAKE 1 TABLET BY MOUTH EVERYDAY AT BEDTIME   carbidopa-levodopa (SINEMET IR) 25-100 MG tablet TAKE 1 TABLET BY MOUTH 6 (SIX) TIMES DAILY. (Patient taking differently: Take 1 tablet by mouth at bedtime.)   famotidine (PEPCID) 20 MG tablet Take 20 mg by mouth in the morning and at bedtime.    fludrocortisone (FLORINEF) 0.1 MG tablet TAKE 0.5 TABLETS BY MOUTH DAILY.   Glucosamine-Chondroitin (COSAMIN DS PO) Take 1 tablet by mouth in the morning.   lidocaine-prilocaine (EMLA) cream Apply to affected area once   metFORMIN (GLUCOPHAGE) 1000 MG tablet Take 1 tablet (1,000 mg total) by mouth 2 (two) times daily with a meal.   Multiple Vitamin (MULTIVITAMIN) tablet Take 1 tablet by mouth daily with breakfast.   polyethylene glycol (MIRALAX / GLYCOLAX) 17 g packet Take 17 g by mouth every evening.   prochlorperazine (COMPAZINE) 10 MG tablet Take 1 tablet (10 mg total) by mouth every 6 (six) hours as needed for nausea or vomiting.   QUEtiapine (SEROQUEL XR) 50 MG TB24 24 hr tablet Take 50 mg by mouth at bedtime.   amLODipine (NORVASC) 10 MG tablet Take 1 tablet (10 mg total) by mouth daily. (Patient not taking:  Reported on 09/20/2022)   ascorbic acid (VITAMIN C) 500 MG tablet Take 500 mg by mouth in the morning.   Cinnamon 500 MG capsule Take 500 mg by mouth daily.   ondansetron (ZOFRAN) 8 MG tablet Take 1 tablet (8 mg total) by mouth every 8 (eight) hours as needed for nausea or vomiting.   simvastatin (ZOCOR) 20 MG tablet Take 20 mg by mouth every Monday, Wednesday, and Friday at 6 PM.   No facility-administered encounter medications on file as of 01/28/2023.  :  Review of Systems:  Out of a complete 14 point review of systems, all are reviewed and negative with the exception of these symptoms as listed below:  Review of Systems  Neurological:        Pt here for parkinson's f/u Wife states pt has pancreatic cancer stage 4  Wife states patient not walking. Pt unable to do ADL . Pt not able to feed himself . Wife states increased confusion at night  Pt and wife want to discuss increase dosage for sinemet       Objective:  Neurological Exam  Physical Exam Physical Examination:   Vitals:   01/28/23 1333  BP:  112/69  Pulse: 92    General Examination: The patient is a very pleasant 72 y.o. male in no acute distress. He appears frail, minimally verbal. Well groomed.   HEENT: Normocephalic, atraumatic, pupils are equal, round and reactive to light, extraocular tracking is difficult for him, significant forward flexion of his neck is noted and he is leaning forward and to the right in the wheelchair. He wears prism eyeglasses.  Hearing is grossly intact.  Speech is hypophonic and mildly dysarthric, but scant. Face is moderately masked, neck is moderately rigid. Airway examination is limited, mild mouth dryness.  No obvious sialorrhea.  Chest: Clear to auscultation without wheezing, rhonchi or crackles noted.   Heart: S1+S2+0, regular and normal without murmurs, rubs or gallops noted.    Abdomen: Soft, non-tender and non-distended.   Extremities: There is no pitting edema in the distal lower  extremities bilaterally.    Skin: Warm and dry without trophic changes noted.  But distal upper and lower extremities are colder to touch.   Musculoskeletal: exam reveals no new changes.     Neurologically:  Mental status: The patient is awake, alert and oriented, pays good attention, history is provided primarily by his wife. Speech as above. Thought process is linear. Mood is normal and affect is normal otherwise.    On 06/04/2019: MMSE: 25/30, CDT: 2/4, AFT: 14/min.   Cranial nerves II - XII are as described above under HEENT exam.  Motor exam: Normal appearing bulk, global strength of 4 out of 5, increased tone particularly on the right side. Moderate to severe bradykinesia.  No obvious resting tremor, no obvious dyskinesias. No active hallucinations currently, but tends to pick at his pant leg as if picking off something. Fine motor skills are severely impaired on the right and moderate to severely impaired on the left. Sensory exam is intact to light touch. Cerebellar testing shows no dysmetria or intention tremor. Gait, station and balance: He is in a wheelchair. I did not have him stand or walk for me today.       Assessment and plan:    In summary, WISE MOTHERWAY is a very pleasant 72 year old right-handed gentleman with an underlying medical history of type 2 diabetes, osteoarthritis, status post right knee replacement in March 2021, hypertension, obesity, hyperlipidemia, cataracts, cholelithiasis with status post right cholecystectomy on 07/02/2022, recurrent pancreatitis and diagnosis of pancreatic adenocarcinoma in 2023, who presents for follow-up consultation of his advanced Parkinson's disease, complicated by decline in mobility, knee pain on the R, history of IBD, memory loss, hallucinations, and recurrent falls. He received an ultra light-weight manual wheelchair in the fall 2023.  He is on Sinemet IR 1 pill 6 times a day.  I do not recommend increasing this as he sleeps throughout  the day and often has to be woken up for his first 2 doses of the day.  If anything we may want to scale back on his levodopa at some point.  Increasing levodopa would put him at higher risk for worsening hallucinations, low blood pressure and increase in sleepiness.  His wife is in agreement to stay with the current regimen. He is advised to follow-up in about 3 to 4 months, sooner if needed.  I answered all their questions today and the patient and his wife are in agreement. I spent 30 minutes in total face-to-face time and in reviewing records during pre-charting, more than 50% of which was spent in counseling and coordination of care, reviewing test results, reviewing medications  and treatment regimen and/or in discussing or reviewing the diagnosis of PD, the prognosis and treatment options. Pertinent laboratory and imaging test results that were available during this visit with the patient were reviewed by me and considered in my medical decision making (see chart for details).

## 2023-01-29 ENCOUNTER — Encounter: Payer: Self-pay | Admitting: Hematology

## 2023-01-29 ENCOUNTER — Other Ambulatory Visit: Payer: Self-pay

## 2023-02-05 ENCOUNTER — Encounter: Payer: Self-pay | Admitting: Hematology

## 2023-02-20 ENCOUNTER — Other Ambulatory Visit: Payer: Self-pay | Admitting: Neurology

## 2023-03-20 ENCOUNTER — Telehealth: Payer: Self-pay | Admitting: Neurology

## 2023-03-20 NOTE — Telephone Encounter (Signed)
I had a nice, extended conversation with the patient's wife.  I offered my condolences.  Thankfully, she has a very good support system and is holding up okay.  She was very appreciative of the call.  I wished her all the best.

## 2023-03-20 NOTE — Telephone Encounter (Signed)
Pt's wife called to inform provider that the pt has passed away on 04/01/23. Wife stated that she would like to speak to the provider herself to inform her and thank her for all she has done for her husband.

## 2023-03-20 NOTE — Telephone Encounter (Signed)
We are so sorry to hear of this as well.

## 2023-03-20 NOTE — Telephone Encounter (Signed)
Very sorry to hear. I will call her.

## 2023-04-17 DEATH — deceased

## 2023-05-20 ENCOUNTER — Ambulatory Visit: Payer: Medicare Other | Admitting: Neurology
